# Patient Record
Sex: Female | Born: 1955
Health system: Southern US, Community
[De-identification: ages and names within clinical notes are randomized; demographics above are authoritative.]

## PROBLEM LIST (undated history)

## (undated) DIAGNOSIS — J189 Pneumonia, unspecified organism: Secondary | ICD-10-CM

## (undated) DIAGNOSIS — F32A Depression, unspecified: Secondary | ICD-10-CM

## (undated) DIAGNOSIS — N179 Acute kidney failure, unspecified: Secondary | ICD-10-CM

## (undated) DIAGNOSIS — T7840XA Allergy, unspecified, initial encounter: Secondary | ICD-10-CM

## (undated) DIAGNOSIS — I890 Lymphedema, not elsewhere classified: Secondary | ICD-10-CM

## (undated) DIAGNOSIS — K219 Gastro-esophageal reflux disease without esophagitis: Secondary | ICD-10-CM

## (undated) DIAGNOSIS — Z720 Tobacco use: Secondary | ICD-10-CM

## (undated) DIAGNOSIS — B1920 Unspecified viral hepatitis C without hepatic coma: Secondary | ICD-10-CM

## (undated) DIAGNOSIS — IMO0001 Reserved for inherently not codable concepts without codable children: Secondary | ICD-10-CM

## (undated) DIAGNOSIS — F1010 Alcohol abuse, uncomplicated: Secondary | ICD-10-CM

## (undated) DIAGNOSIS — C50919 Malignant neoplasm of unspecified site of unspecified female breast: Secondary | ICD-10-CM

## (undated) DIAGNOSIS — F319 Bipolar disorder, unspecified: Secondary | ICD-10-CM

## (undated) DIAGNOSIS — G620 Drug-induced polyneuropathy: Secondary | ICD-10-CM

## (undated) DIAGNOSIS — C801 Malignant (primary) neoplasm, unspecified: Secondary | ICD-10-CM

## (undated) DIAGNOSIS — T451X5A Adverse effect of antineoplastic and immunosuppressive drugs, initial encounter: Secondary | ICD-10-CM

## (undated) DIAGNOSIS — F419 Anxiety disorder, unspecified: Secondary | ICD-10-CM

## (undated) DIAGNOSIS — F1410 Cocaine abuse, uncomplicated: Secondary | ICD-10-CM

## (undated) DIAGNOSIS — F329 Major depressive disorder, single episode, unspecified: Secondary | ICD-10-CM

## (undated) DIAGNOSIS — I10 Essential (primary) hypertension: Secondary | ICD-10-CM

## (undated) DIAGNOSIS — M1990 Unspecified osteoarthritis, unspecified site: Secondary | ICD-10-CM

## (undated) HISTORY — DX: Malignant (primary) neoplasm, unspecified: C80.1

## (undated) HISTORY — DX: Lymphedema, not elsewhere classified: I89.0

## (undated) HISTORY — PX: TONSILLECTOMY: SUR1361

## (undated) HISTORY — DX: Adverse effect of antineoplastic and immunosuppressive drugs, initial encounter: T45.1X5A

## (undated) HISTORY — PX: MANDIBLE FRACTURE SURGERY: SHX706

## (undated) HISTORY — PX: MASTECTOMY: SHX3

## (undated) HISTORY — DX: Acute kidney failure, unspecified: N17.9

## (undated) HISTORY — DX: Drug-induced polyneuropathy: G62.0

## (undated) HISTORY — DX: Pneumonia, unspecified organism: J18.9

---

## 2000-05-23 DIAGNOSIS — C801 Malignant (primary) neoplasm, unspecified: Secondary | ICD-10-CM

## 2000-05-23 HISTORY — DX: Malignant (primary) neoplasm, unspecified: C80.1

## 2000-05-23 HISTORY — PX: BREAST SURGERY: SHX581

## 2005-02-23 ENCOUNTER — Emergency Department (HOSPITAL_COMMUNITY): Admission: EM | Admit: 2005-02-23 | Discharge: 2005-02-24 | Payer: Self-pay | Admitting: Emergency Medicine

## 2005-06-13 ENCOUNTER — Ambulatory Visit: Payer: Self-pay | Admitting: Internal Medicine

## 2005-06-17 ENCOUNTER — Ambulatory Visit: Payer: Self-pay | Admitting: Internal Medicine

## 2005-06-24 ENCOUNTER — Ambulatory Visit: Payer: Self-pay | Admitting: Internal Medicine

## 2005-07-01 ENCOUNTER — Ambulatory Visit: Payer: Self-pay | Admitting: Internal Medicine

## 2005-07-22 ENCOUNTER — Ambulatory Visit: Payer: Self-pay | Admitting: Internal Medicine

## 2005-09-02 ENCOUNTER — Ambulatory Visit: Payer: Self-pay | Admitting: Internal Medicine

## 2005-10-10 ENCOUNTER — Ambulatory Visit: Payer: Self-pay | Admitting: Internal Medicine

## 2006-05-01 ENCOUNTER — Ambulatory Visit: Payer: Self-pay | Admitting: Internal Medicine

## 2006-05-20 ENCOUNTER — Emergency Department (HOSPITAL_COMMUNITY): Admission: EM | Admit: 2006-05-20 | Discharge: 2006-05-20 | Payer: Self-pay | Admitting: Emergency Medicine

## 2006-05-21 ENCOUNTER — Ambulatory Visit: Payer: Self-pay | Admitting: Internal Medicine

## 2006-06-15 LAB — CBC WITH DIFFERENTIAL/PLATELET
EOS%: 4.1 % (ref 0.0–7.0)
Eosinophils Absolute: 0.2 10*3/uL (ref 0.0–0.5)
HCT: 36.4 % (ref 34.8–46.6)
HGB: 12.4 g/dL (ref 11.6–15.9)
LYMPH%: 36.2 % (ref 14.0–48.0)
MCHC: 34 g/dL (ref 32.0–36.0)
MONO#: 0.4 10*3/uL (ref 0.1–0.9)
MONO%: 9.6 % (ref 0.0–13.0)
NEUT#: 2.2 10*3/uL (ref 1.5–6.5)
NEUT%: 49.4 % (ref 39.6–76.8)
Platelets: 125 10*3/uL — ABNORMAL LOW (ref 145–400)
WBC: 4.4 10*3/uL (ref 3.9–10.0)
lymph#: 1.6 10*3/uL (ref 0.9–3.3)

## 2006-06-15 LAB — COMPREHENSIVE METABOLIC PANEL
AST: 24 U/L (ref 0–37)
Albumin: 3.7 g/dL (ref 3.5–5.2)
Alkaline Phosphatase: 53 U/L (ref 39–117)
BUN: 14 mg/dL (ref 6–23)
Potassium: 4.1 mEq/L (ref 3.5–5.3)
Sodium: 140 mEq/L (ref 135–145)

## 2006-06-29 ENCOUNTER — Encounter: Admission: RE | Admit: 2006-06-29 | Discharge: 2006-06-29 | Payer: Self-pay | Admitting: Hematology and Oncology

## 2007-06-05 LAB — CONVERTED CEMR LAB: Pap Smear: NEGATIVE

## 2007-06-07 ENCOUNTER — Ambulatory Visit: Payer: Self-pay | Admitting: Nurse Practitioner

## 2007-06-07 DIAGNOSIS — F1910 Other psychoactive substance abuse, uncomplicated: Secondary | ICD-10-CM | POA: Insufficient documentation

## 2007-06-07 DIAGNOSIS — K5909 Other constipation: Secondary | ICD-10-CM

## 2007-06-07 DIAGNOSIS — F4310 Post-traumatic stress disorder, unspecified: Secondary | ICD-10-CM

## 2007-06-07 DIAGNOSIS — S060X9A Concussion with loss of consciousness of unspecified duration, initial encounter: Secondary | ICD-10-CM | POA: Insufficient documentation

## 2007-06-07 DIAGNOSIS — A5901 Trichomonal vulvovaginitis: Secondary | ICD-10-CM | POA: Insufficient documentation

## 2007-06-07 DIAGNOSIS — S060XAA Concussion with loss of consciousness status unknown, initial encounter: Secondary | ICD-10-CM | POA: Insufficient documentation

## 2007-06-07 DIAGNOSIS — F319 Bipolar disorder, unspecified: Secondary | ICD-10-CM

## 2007-06-07 DIAGNOSIS — B182 Chronic viral hepatitis C: Secondary | ICD-10-CM | POA: Insufficient documentation

## 2007-06-07 DIAGNOSIS — Z853 Personal history of malignant neoplasm of breast: Secondary | ICD-10-CM | POA: Insufficient documentation

## 2007-06-07 DIAGNOSIS — K7460 Unspecified cirrhosis of liver: Secondary | ICD-10-CM

## 2007-06-07 LAB — CONVERTED CEMR LAB
ALT: 19 units/L (ref 0–35)
AST: 28 units/L (ref 0–37)
Albumin: 4.2 g/dL (ref 3.5–5.2)
Alkaline Phosphatase: 60 units/L (ref 39–117)
BUN: 13 mg/dL (ref 6–23)
Basophils Absolute: 0 10*3/uL (ref 0.0–0.1)
Basophils Relative: 1 % (ref 0–1)
Bilirubin Urine: NEGATIVE
CO2: 25 meq/L (ref 19–32)
Calcium: 9.5 mg/dL (ref 8.4–10.5)
Chlamydia, DNA Probe: NEGATIVE
Chloride: 106 meq/L (ref 96–112)
Creatinine, Ser: 0.8 mg/dL (ref 0.40–1.20)
Eosinophils Absolute: 0.2 10*3/uL (ref 0.0–0.7)
Eosinophils Relative: 4 % (ref 0–5)
GC Probe Amp, Genital: NEGATIVE
Glucose, Bld: 88 mg/dL (ref 70–99)
Glucose, Urine, Semiquant: NEGATIVE
HCT: 39.3 % (ref 36.0–46.0)
Hemoglobin: 13 g/dL (ref 12.0–15.0)
KOH Prep: NEGATIVE
Ketones, urine, test strip: NEGATIVE
Lymphocytes Relative: 42 % (ref 12–46)
Lymphs Abs: 2.5 10*3/uL (ref 0.7–4.0)
MCHC: 33.1 g/dL (ref 30.0–36.0)
MCV: 88.7 fL (ref 78.0–100.0)
Monocytes Absolute: 0.6 10*3/uL (ref 0.1–1.0)
Monocytes Relative: 10 % (ref 3–12)
Neutro Abs: 2.6 10*3/uL (ref 1.7–7.7)
Neutrophils Relative %: 44 % (ref 43–77)
Nitrite: NEGATIVE
Platelets: 131 10*3/uL — ABNORMAL LOW (ref 150–400)
Potassium: 4.9 meq/L (ref 3.5–5.3)
Protein, U semiquant: NEGATIVE
RBC: 4.43 M/uL (ref 3.87–5.11)
RDW: 14.6 % (ref 11.5–15.5)
Sodium: 142 meq/L (ref 135–145)
Specific Gravity, Urine: 1.015
TSH: 3.371 microintl units/mL (ref 0.350–5.50)
Total Bilirubin: 0.4 mg/dL (ref 0.3–1.2)
Total Protein: 7.6 g/dL (ref 6.0–8.3)
Urobilinogen, UA: 4
WBC Urine, dipstick: NEGATIVE
WBC: 5.9 10*3/uL (ref 4.0–10.5)
pH: 6.5

## 2007-06-08 ENCOUNTER — Encounter (INDEPENDENT_AMBULATORY_CARE_PROVIDER_SITE_OTHER): Payer: Self-pay | Admitting: Nurse Practitioner

## 2007-06-08 DIAGNOSIS — D696 Thrombocytopenia, unspecified: Secondary | ICD-10-CM | POA: Insufficient documentation

## 2007-06-12 ENCOUNTER — Ambulatory Visit: Payer: Self-pay | Admitting: Internal Medicine

## 2007-06-19 ENCOUNTER — Ambulatory Visit: Payer: Self-pay | Admitting: Nurse Practitioner

## 2007-06-20 LAB — CONVERTED CEMR LAB
ALT: 24 units/L (ref 0–35)
AST: 33 units/L (ref 0–37)
Albumin: 4.2 g/dL (ref 3.5–5.2)
Alkaline Phosphatase: 57 units/L (ref 39–117)
BUN: 11 mg/dL (ref 6–23)
Basophils Absolute: 0 10*3/uL (ref 0.0–0.1)
Basophils Relative: 0 % (ref 0–1)
CO2: 25 meq/L (ref 19–32)
Calcium: 9.5 mg/dL (ref 8.4–10.5)
Chloride: 110 meq/L (ref 96–112)
Cholesterol: 169 mg/dL (ref 0–200)
Creatinine, Ser: 0.9 mg/dL (ref 0.40–1.20)
Eosinophils Absolute: 0.1 10*3/uL (ref 0.0–0.7)
Eosinophils Relative: 2 % (ref 0–5)
Glucose, Bld: 93 mg/dL (ref 70–99)
HCT: 41.9 % (ref 36.0–46.0)
HDL: 55 mg/dL (ref >39)
Hemoglobin: 13.1 g/dL (ref 12.0–15.0)
Hgb A1c MFr Bld: 5.8 % (ref 4.6–6.1)
LDL Cholesterol: 98 mg/dL (ref 0–99)
Lymphocytes Relative: 39 % (ref 12–46)
Lymphs Abs: 2.2 10*3/uL (ref 0.7–4.0)
MCHC: 31.3 g/dL (ref 30.0–36.0)
MCV: 93.3 fL (ref 78.0–100.0)
Monocytes Absolute: 0.5 10*3/uL (ref 0.1–1.0)
Monocytes Relative: 9 % (ref 3–12)
Neutro Abs: 2.9 10*3/uL (ref 1.7–7.7)
Neutrophils Relative %: 50 % (ref 43–77)
Platelets: 157 10*3/uL (ref 150–400)
Potassium: 5 meq/L (ref 3.5–5.3)
RBC: 4.49 M/uL (ref 3.87–5.11)
RDW: 15.5 % (ref 11.5–15.5)
Sodium: 146 meq/L — ABNORMAL HIGH (ref 135–145)
TSH: 3.009 microintl units/mL (ref 0.350–5.50)
Total Bilirubin: 0.4 mg/dL (ref 0.3–1.2)
Total CHOL/HDL Ratio: 3.1
Total Protein: 7.6 g/dL (ref 6.0–8.3)
Triglycerides: 81 mg/dL (ref <150)
VLDL: 16 mg/dL (ref 0–40)
Valproic Acid Lvl: 92.6 ug/mL (ref 50.0–100.0)
WBC: 5.7 10*3/uL (ref 4.0–10.5)

## 2008-09-03 ENCOUNTER — Ambulatory Visit: Payer: Self-pay | Admitting: Nurse Practitioner

## 2008-09-03 DIAGNOSIS — M545 Low back pain, unspecified: Secondary | ICD-10-CM | POA: Insufficient documentation

## 2008-09-05 LAB — CONVERTED CEMR LAB
ALT: 20 units/L (ref 0–35)
AST: 26 units/L (ref 0–37)
Albumin: 4 g/dL (ref 3.5–5.2)
Alkaline Phosphatase: 55 units/L (ref 39–117)
BUN: 14 mg/dL (ref 6–23)
Basophils Absolute: 0 10*3/uL (ref 0.0–0.1)
Basophils Relative: 1 % (ref 0–1)
CO2: 22 meq/L (ref 19–32)
Calcium: 9.6 mg/dL (ref 8.4–10.5)
Chloride: 106 meq/L (ref 96–112)
Creatinine, Ser: 0.94 mg/dL (ref 0.40–1.20)
Eosinophils Absolute: 0.2 10*3/uL (ref 0.0–0.7)
Eosinophils Relative: 3 % (ref 0–5)
Glucose, Bld: 108 mg/dL — ABNORMAL HIGH (ref 70–99)
HCT: 39.3 % (ref 36.0–46.0)
Hemoglobin: 12.9 g/dL (ref 12.0–15.0)
Lymphocytes Relative: 50 % — ABNORMAL HIGH (ref 12–46)
Lymphs Abs: 2.7 10*3/uL (ref 0.7–4.0)
MCHC: 32.8 g/dL (ref 30.0–36.0)
MCV: 89.9 fL (ref 78.0–100.0)
Monocytes Absolute: 0.4 10*3/uL (ref 0.1–1.0)
Monocytes Relative: 8 % (ref 3–12)
Neutro Abs: 2 10*3/uL (ref 1.7–7.7)
Neutrophils Relative %: 38 % — ABNORMAL LOW (ref 43–77)
Platelets: 121 10*3/uL — ABNORMAL LOW (ref 150–400)
Potassium: 4.9 meq/L (ref 3.5–5.3)
RBC: 4.37 M/uL (ref 3.87–5.11)
RDW: 15.7 % — ABNORMAL HIGH (ref 11.5–15.5)
Sodium: 143 meq/L (ref 135–145)
TSH: 1.107 microintl units/mL (ref 0.350–4.500)
Total Bilirubin: 0.3 mg/dL (ref 0.3–1.2)
Total Protein: 7.1 g/dL (ref 6.0–8.3)
WBC: 5.3 10*3/uL (ref 4.0–10.5)

## 2008-09-15 ENCOUNTER — Ambulatory Visit (HOSPITAL_COMMUNITY): Admission: RE | Admit: 2008-09-15 | Discharge: 2008-09-15 | Payer: Self-pay | Admitting: Nurse Practitioner

## 2008-09-15 ENCOUNTER — Encounter (INDEPENDENT_AMBULATORY_CARE_PROVIDER_SITE_OTHER): Payer: Self-pay | Admitting: Nurse Practitioner

## 2010-01-15 ENCOUNTER — Encounter (INDEPENDENT_AMBULATORY_CARE_PROVIDER_SITE_OTHER): Payer: Self-pay | Admitting: Nurse Practitioner

## 2010-01-20 ENCOUNTER — Ambulatory Visit: Payer: Self-pay | Admitting: Nurse Practitioner

## 2010-01-20 DIAGNOSIS — K149 Disease of tongue, unspecified: Secondary | ICD-10-CM | POA: Insufficient documentation

## 2010-02-03 ENCOUNTER — Emergency Department (HOSPITAL_COMMUNITY): Admission: EM | Admit: 2010-02-03 | Discharge: 2010-02-03 | Payer: Self-pay | Admitting: Emergency Medicine

## 2010-03-23 ENCOUNTER — Other Ambulatory Visit: Admission: RE | Admit: 2010-03-23 | Discharge: 2010-03-23 | Payer: Self-pay | Admitting: Nurse Practitioner

## 2010-03-23 ENCOUNTER — Ambulatory Visit: Payer: Self-pay | Admitting: Nurse Practitioner

## 2010-03-23 LAB — CONVERTED CEMR LAB
KOH Prep: NEGATIVE
OCCULT 1: NEGATIVE
Rapid HIV Screen: NEGATIVE

## 2010-03-25 LAB — CONVERTED CEMR LAB
ALT: 16 units/L (ref 0–35)
AST: 23 units/L (ref 0–37)
Albumin: 4 g/dL (ref 3.5–5.2)
Alkaline Phosphatase: 50 units/L (ref 39–117)
BUN: 8 mg/dL (ref 6–23)
Basophils Absolute: 0 10*3/uL (ref 0.0–0.1)
Basophils Relative: 0 % (ref 0–1)
CO2: 25 meq/L (ref 19–32)
Calcium: 8.8 mg/dL (ref 8.4–10.5)
Chlamydia, DNA Probe: NEGATIVE
Chloride: 105 meq/L (ref 96–112)
Creatinine, Ser: 0.69 mg/dL (ref 0.40–1.20)
Eosinophils Absolute: 0.1 10*3/uL (ref 0.0–0.7)
Eosinophils Relative: 3 % (ref 0–5)
GC Probe Amp, Genital: NEGATIVE
Glucose, Bld: 81 mg/dL (ref 70–99)
HCT: 36.2 % (ref 36.0–46.0)
Hemoglobin: 11.8 g/dL — ABNORMAL LOW (ref 12.0–15.0)
Lymphocytes Relative: 44 % (ref 12–46)
Lymphs Abs: 2.3 10*3/uL (ref 0.7–4.0)
MCHC: 32.6 g/dL (ref 30.0–36.0)
MCV: 91.2 fL (ref 78.0–100.0)
Monocytes Absolute: 0.5 10*3/uL (ref 0.1–1.0)
Monocytes Relative: 10 % (ref 3–12)
Neutro Abs: 2.2 10*3/uL (ref 1.7–7.7)
Neutrophils Relative %: 43 % (ref 43–77)
Platelets: 94 10*3/uL — ABNORMAL LOW (ref 150–400)
Potassium: 4.3 meq/L (ref 3.5–5.3)
RBC: 3.97 M/uL (ref 3.87–5.11)
RDW: 15.6 % — ABNORMAL HIGH (ref 11.5–15.5)
Sodium: 141 meq/L (ref 135–145)
TSH: 2.295 microintl units/mL (ref 0.350–4.500)
Total Bilirubin: 0.4 mg/dL (ref 0.3–1.2)
Total Protein: 6.8 g/dL (ref 6.0–8.3)
WBC: 5.1 10*3/uL (ref 4.0–10.5)

## 2010-03-26 ENCOUNTER — Encounter (INDEPENDENT_AMBULATORY_CARE_PROVIDER_SITE_OTHER): Payer: Self-pay | Admitting: Nurse Practitioner

## 2010-03-26 LAB — CONVERTED CEMR LAB: Pap Smear: NEGATIVE

## 2010-03-29 ENCOUNTER — Encounter: Admission: RE | Admit: 2010-03-29 | Discharge: 2010-03-29 | Payer: Self-pay | Admitting: Internal Medicine

## 2010-04-01 ENCOUNTER — Encounter: Admission: RE | Admit: 2010-04-01 | Discharge: 2010-04-01 | Payer: Self-pay | Admitting: Otolaryngology

## 2010-04-20 ENCOUNTER — Telehealth (INDEPENDENT_AMBULATORY_CARE_PROVIDER_SITE_OTHER): Payer: Self-pay | Admitting: Nurse Practitioner

## 2010-04-20 ENCOUNTER — Ambulatory Visit: Payer: Self-pay | Admitting: Nurse Practitioner

## 2010-04-20 ENCOUNTER — Encounter: Payer: Self-pay | Admitting: Gastroenterology

## 2010-04-23 ENCOUNTER — Encounter (INDEPENDENT_AMBULATORY_CARE_PROVIDER_SITE_OTHER): Payer: Self-pay | Admitting: *Deleted

## 2010-04-26 ENCOUNTER — Encounter (INDEPENDENT_AMBULATORY_CARE_PROVIDER_SITE_OTHER): Payer: Self-pay | Admitting: Nurse Practitioner

## 2010-04-29 ENCOUNTER — Encounter
Admission: RE | Admit: 2010-04-29 | Discharge: 2010-04-29 | Payer: Self-pay | Source: Home / Self Care | Attending: Internal Medicine | Admitting: Internal Medicine

## 2010-05-11 ENCOUNTER — Ambulatory Visit: Payer: Self-pay | Admitting: Nurse Practitioner

## 2010-05-13 ENCOUNTER — Encounter (INDEPENDENT_AMBULATORY_CARE_PROVIDER_SITE_OTHER): Payer: Self-pay | Admitting: *Deleted

## 2010-05-13 ENCOUNTER — Encounter (INDEPENDENT_AMBULATORY_CARE_PROVIDER_SITE_OTHER): Payer: Self-pay | Admitting: Nurse Practitioner

## 2010-05-13 ENCOUNTER — Emergency Department (HOSPITAL_COMMUNITY)
Admission: EM | Admit: 2010-05-13 | Discharge: 2010-05-13 | Payer: Self-pay | Source: Home / Self Care | Admitting: Family Medicine

## 2010-05-13 DIAGNOSIS — E669 Obesity, unspecified: Secondary | ICD-10-CM | POA: Insufficient documentation

## 2010-05-13 LAB — CONVERTED CEMR LAB
Cholesterol: 187 mg/dL (ref 0–200)
HDL: 73 mg/dL (ref >39)
LDL Cholesterol: 95 mg/dL (ref 0–99)
Total CHOL/HDL Ratio: 2.6
Triglycerides: 93 mg/dL (ref <150)
VLDL: 19 mg/dL (ref 0–40)

## 2010-05-14 ENCOUNTER — Encounter (INDEPENDENT_AMBULATORY_CARE_PROVIDER_SITE_OTHER): Payer: Self-pay | Admitting: Nurse Practitioner

## 2010-05-19 ENCOUNTER — Encounter (INDEPENDENT_AMBULATORY_CARE_PROVIDER_SITE_OTHER): Payer: Self-pay | Admitting: *Deleted

## 2010-05-21 ENCOUNTER — Ambulatory Visit: Admit: 2010-05-21 | Payer: Self-pay | Admitting: Gastroenterology

## 2010-06-07 ENCOUNTER — Telehealth: Payer: Self-pay | Admitting: Gastroenterology

## 2010-06-08 ENCOUNTER — Telehealth (INDEPENDENT_AMBULATORY_CARE_PROVIDER_SITE_OTHER): Payer: Self-pay | Admitting: *Deleted

## 2010-06-10 ENCOUNTER — Ambulatory Visit
Admission: RE | Admit: 2010-06-10 | Discharge: 2010-06-10 | Payer: Self-pay | Source: Home / Self Care | Attending: Gastroenterology | Admitting: Gastroenterology

## 2010-06-10 ENCOUNTER — Ambulatory Visit: Admit: 2010-06-10 | Payer: Self-pay | Admitting: Gastroenterology

## 2010-06-10 ENCOUNTER — Encounter (INDEPENDENT_AMBULATORY_CARE_PROVIDER_SITE_OTHER): Payer: Self-pay | Admitting: Nurse Practitioner

## 2010-06-10 ENCOUNTER — Encounter: Payer: Self-pay | Admitting: Gastroenterology

## 2010-06-10 ENCOUNTER — Encounter (INDEPENDENT_AMBULATORY_CARE_PROVIDER_SITE_OTHER): Payer: Self-pay | Admitting: *Deleted

## 2010-06-22 NOTE — Assessment & Plan Note (Signed)
Summary: F/u lab visit   Vital Signs:  Patient profile:   55 year old female Menstrual status:  postmenopausal Weight:      146.7 pounds BMI:     28.75 Temp:     97.1 degrees F oral Pulse rate:   60 / minute Pulse rhythm:   regular Resp:     16 per minute BP sitting:   130 / 80  (left arm) Cuff size:   regular  Vitals Entered By: Levon Hedger (April 20, 2010 10:21 AM)  Nutrition Counseling: Patient's BMI is greater than 25 and therefore counseled on weight management options. CC: follow-up visit 4 weeks...fasting labs pt has alread eaten today. Is Patient Diabetic? No Pain Assessment Patient in pain? no       Does patient need assistance? Functional Status Self care Ambulation Normal   CC:  follow-up visit 4 weeks...fasting labs pt has alread eaten today.Marland Kitchen  History of Present Illness:  Pt into the office for review of labs. She was supposed to be fasting but reports that she ate breakfast this morning. Labs done during last visit show that she is thrombocytopenic. This has been an ongoing problem for pt. She still admits to some ETOH use - about 2-3 beers per week which has decreased from previous amount. No menses Denies bleeding for extended periods of time with cuts  ENT - pt has been going and was recently started on medications for acid reflux but she does not know the name of the medications and she did not bring it here with her today.  Habits & Providers  Alcohol-Tobacco-Diet     Alcohol drinks/day: 1     Alcohol Counseling: to decrease amount and/or frequency of alcohol intake     Alcohol type: beer     Tobacco Status: current     Tobacco Counseling: to quit use of tobacco products     Cigarette Packs/Day: 1/4     Year Started: 1972     Passive Smoke Exposure: no  Exercise-Depression-Behavior     Does Patient Exercise: no     Drug Use: marijuanna     Seat Belt Use: 100     Sun Exposure: occasionally  Allergies: No Known Drug  Allergies  Review of Systems General:  Denies fever. CV:  Denies chest pain or discomfort. Resp:  Denies cough. GI:  Denies abdominal pain, nausea, and vomiting.  Physical Exam  General:  alert.   Head:  normocephalic.   Ears:  ear piercing(s) noted.   Lungs:  normal breath sounds.   Heart:  normal rate and regular rhythm.     Impression & Recommendations:  Problem # 1:  THROMBOCYTOPENIA (ICD-287.5) Pt has a history of hepatitis C which may be why pt is thrombocytenia She was treated at [facility]  Problem # 2:  SCREENING, COLON CANCER (ICD-V76.51) will schedule for pt Hx of constipation never had colonscopy no family hx of colon cancer Orders: Colonoscopy (Colon)  Problem # 3:  BIPOLAR AFFECTIVE DISORDER (ICD-296.80) pt to continue f/u at the [facility] meds reviewed today with pt  Complete Medication List: 1)  Sertraline Hcl 100 Mg Tabs (Sertraline hcl) .... 2 tablets by mouth at bedtime rx by dr. Gwyndolyn Kaufman 2)  Neurontin 300 Mg Caps (Gabapentin) .... 3 capsules by mouth at bedtime rx by dr. Gwyndolyn Kaufman 3)  Depakote 500 Mg Tbec (Divalproex sodium) .... 2 tablets by mouth at bedtime rx by dr. Gwyndolyn Kaufman 4)  Trazodone Hcl 50 Mg Tabs (Trazodone hcl) .Marland Kitchen.. 1-2  tablets by mouth at bedtime **rx by [facility]**  Patient Instructions: 1)  Schedule a lab visit for lipids. 2)  No food after midnight before this visit 3)  You will be scheduled for a colonscopy. 4)  You will be notified of the time/date of this appointment. 5)  Follow up in this office at least every 6 months or sooner if necessary   Orders Added: 1)  Est. Patient Level III [09811] 2)  Colonoscopy [Colon]    Prevention & Chronic Care Immunizations   Influenza vaccine: Fluvax 3+  (03/23/2010)    Tetanus booster: 05/23/2005: historical    Pneumococcal vaccine: Not documented  Colorectal Screening   Hemoccult: Not documented   Hemoccult action/deferral: Ordered  (03/23/2010)   Hemoccult due: 03/24/2011     Colonoscopy: Not documented   Colonoscopy action/deferral: GI referral  (04/20/2010)  Other Screening   Pap smear:  Specimen Adequacy: Satisfactory for evaluation.   Interpretation/Result:Negative for intraepithelial Lesion or Malignancy.     (03/23/2010)   Pap smear action/deferral: Ordered  (03/23/2010)   Pap smear due: 03/2011    Mammogram: mammograms for comparison - BI-RADS 0^MM DIGITAL SCREENING UNILAT R  (03/29/2010)   Mammogram action/deferral: Ordered  (03/23/2010)   Smoking status: current  (04/20/2010)   Smoking cessation counseling: yes  (03/23/2010)  Lipids   Total Cholesterol: 169  (06/19/2007)   LDL: 98  (06/19/2007)   LDL Direct: Not documented   HDL: 55  (06/19/2007)   Triglycerides: 81  (06/19/2007)

## 2010-06-22 NOTE — Letter (Signed)
Summary: MAILED REQUESTED RECORDS TO [facility]  MAILED REQUESTED RECORDS TO [facility]   Imported By: Arta Bruce 01/18/2010 14:50:31  _____________________________________________________________________  External Attachment:    Type:   Image     Comment:   External Document

## 2010-06-22 NOTE — Letter (Signed)
Summary: Generic Letter  [facility]  [address] [place], [place] [address]   Phone: [phone]  Fax: [phone]        04/23/2010  Marissa Brooks [address] [place], [place]  [address]  Dear [name],  We have been unable to reach you by phone. The medication you had requested was ordered by your ENT doctor so you will need to contact  them for a refill.         Sincerely,   Gaylyn Cheers RN

## 2010-06-22 NOTE — Letter (Signed)
Summary: MAMMOGRAM  MAMMOGRAM   Imported By: Arta Bruce 04/08/2010 16:48:53  _____________________________________________________________________  External Attachment:    Type:   Image     Comment:   External Document

## 2010-06-22 NOTE — Progress Notes (Signed)
Summary: CALLED BACK W/OTHER MED  Phone Note Call from Patient Call back at Home Phone [phone]   Reason for Call: Refill Medication Summary of Call: Marissa Brooks. MS [place] CALLED BACK TO LET YOU KNOW THE NAME OF ANOTHE MEDICINE THAT SHE NEEDS A REFILL ON. ITS CALLED LANSOPRAZOLE 30MG  1 CAPSULE BY MOUTH BEFORE MEALS, FOR ACID REFLUX. SHE USES [facility] [address] Initial call taken by: Leodis Rains,  April 20, 2010 4:58 PM  Follow-up for Phone Call        Rx started by ENT I asked her to call back and let me know the name of the medication but since ENT started it she needs to consult them for refills. perhaps they just wanted it for a short period of time then re-evaluate Follow-up by: Lehman Prom FNP,  April 20, 2010 5:26 PM  Additional Follow-up for Phone Call Additional follow up Details #1::        [employer] customer not available. Gaylyn Cheers RN  April 21, 2010 8:22 AM [employer] customer not available Gaylyn Cheers RN  April 22, 2010 10:27 AM      Additional Follow-up for Phone Call Additional follow up Details #2::    [employer] customer not available, will mail letter Gaylyn Cheers RN  April 23, 2010 12:51 PM   New/Updated Medications: LANSOPRAZOLE 30 MG CPDR (LANSOPRAZOLE) One tablet by mouth daily

## 2010-06-22 NOTE — Assessment & Plan Note (Signed)
Summary: Tongue problem   Vital Signs:  Patient profile:   55 year old female Menstrual status:  postmenopausal Weight:      157.2 pounds BMI:     30.81 Temp:     97.5 degrees F oral Pulse rate:   60 / minute Pulse rhythm:   regular Resp:     16 per minute BP sitting:   120 / 68  (left arm) Cuff size:   regular  Vitals Entered By: Levon Hedger (January 20, 2010 11:30 AM)  Nutrition Counseling: Patient's BMI is greater than 25 and therefore counseled on weight management options. CC: pt states she keeps getting bumps on her tongue that is painful Is Patient Diabetic? No Pain Assessment Patient in pain? yes     Location: tongue Intensity: 6-7  Does patient need assistance? Functional Status Self care Ambulation Normal     Menstrual Status postmenopausal Last PAP Result negative   CC:  pt states she keeps getting bumps on her tongue that is painful.  History of Present Illness:  Pt into the office with "bumps on her tongue" Started 2 months ago Sometimes the areas spontaneously go away on their own but there is one area on left side of tongue that has been there for the past 2 weeks Both hot and cold foods make the area uncomfortable Denies any recent antibiotics of medication changes admits that she drinks orange juice daily but no other acidic foods    Habits & Providers  Alcohol-Tobacco-Diet     Alcohol drinks/day: 4+     Alcohol type: beer     Tobacco Status: current     Cigarette Packs/Day: 1/4     Year Started: 1972     Passive Smoke Exposure: no  Exercise-Depression-Behavior     Does Patient Exercise: no     Drug Use: yes     Seat Belt Use: 100     Sun Exposure: occasionally  Allergies (verified): No Known Drug Allergies  Review of Systems ENT:  sore tongue. CV:  Denies chest pain or discomfort. Resp:  Denies cough. GI:  Denies abdominal pain, nausea, and vomiting.  Physical Exam  General:  alert.   Head:  normocephalic.   Mouth:   anterior tongue with white plaque posterior tongue with several raised buds left lower ginginva with mucocel fair dentition.   Lungs:  normal breath sounds.   Heart:  normal rate and regular rhythm.   Msk:  normal ROM.   Neurologic:  alert & oriented X3.     Impression & Recommendations:  Problem # 1:  TONGUE DISORDER (ICD-529.9) will start nystatin advise pt to brush tongue will reassess at next visit and do lab work  Problem # 2:  HEPATITIS C (ICD-070.51) hx - unable to ascertain if pt was treated  Complete Medication List: 1)  Ibuprofen 600 Mg Tabs (Ibuprofen) .Marland Kitchen.. 1 tablet by mouth two times a day as needed for pain 2)  Sertraline Hcl 100 Mg Tabs (Sertraline hcl) .... Rx by dr. Gwyndolyn Kaufman 3)  Neurontin 300 Mg Caps (Gabapentin) .... Rx by dr. Gwyndolyn Kaufman 4)  Depakote 500 Mg Tbec (Divalproex sodium) .... Rx by dr. Gwyndolyn Kaufman 5)  Nystatin 100000 Unit/ml Susp (Nystatin) .... One teaspoon before meals and at dinner swish and spit  Patient Instructions: 1)  Use the nystatin three times a day before meals and at bedtime 2)  Brush your tongue with the toothbrush - you may need to buy a new one 3)  Follow up for  a complete physical exam 4)  Come fasting for this appointment  - no food after midnight before this visit 5)  Willl need PAP, mammogram. EKG, u/a, labs - lipids, cbc, cmp, tsh, hiv and STD testing Prescriptions: NYSTATIN 100000 UNIT/ML SUSP (NYSTATIN) One teaspoon before meals and at dinner swish and spit  #437ml x 0   Entered and Authorized by:   Lehman Prom FNP   Signed by:   Lehman Prom FNP on 01/20/2010   Method used:   Print then Give to Patient   RxID:   [id number]

## 2010-06-22 NOTE — Assessment & Plan Note (Signed)
Summary: Complete Physcial Exam   Vital Signs:  Patient profile:   55 year old female Menstrual status:  postmenopausal Weight:      153.7 pounds BMI:     30.13 Temp:     97.5 degrees F oral Pulse rate:   60 / minute Pulse rhythm:   regular Resp:     16 per minute BP sitting:   110 / 70  (left arm) Cuff size:   regular  Vitals Entered By: Levon Hedger (March 23, 2010 10:31 AM)  Nutrition Counseling: Patient's BMI is greater than 25 and therefore counseled on weight management options. CC: CPP...pain in left breast Is Patient Diabetic? No Pain Assessment Patient in pain? yes     Location: left breast  Does patient need assistance? Functional Status Self care Ambulation Normal   CC:  CPP...pain in left breast.  History of Present Illness:  Pt into the office for a complete physical exam  PAP -  Post menopausal - LMP at age 74 No children Separated from husband but "We still see each other" Pt is sexually active with her husband.  Hx of trichomonas and would like retesting for STD  Mammogram - last done in 2009 reports that sister had breast cancer. Later during exam pt reports that she had breast cancer in the 1990's for which she had 2 surgeries  Optho - wears reading glasses No eye exam   Dental - last dental exam was 1 year ago  Hx Hepatitis C: Pt reports that she went to [facility] in 1990's for treatment.  No further f/u done since that time  Habits & Providers  Alcohol-Tobacco-Diet     Alcohol drinks/day: 1     Alcohol Counseling: to decrease amount and/or frequency of alcohol intake     Alcohol type: beer     Tobacco Status: current     Tobacco Counseling: to quit use of tobacco products     Cigarette Packs/Day: 1/4     Year Started: 1972     Passive Smoke Exposure: no  Exercise-Depression-Behavior     Does Patient Exercise: no     Have you felt down or hopeless? no     Have you felt little pleasure in things? no     Drug Use:  marijuanna     Seat Belt Use: 100     Sun Exposure: occasionally  Comments: PHQ-9 score not done; pt already goes to mental health  Allergies (verified): No Known Drug Allergies  Social History: Drug Use:  marijuanna  Review of Systems General:  Denies fever. Eyes:  Denies blurring. ENT:  Complains of ringing in ears; denies earache; " I got to get a hearing aid". CV:  Denies fatigue. Resp:  Denies cough. GI:  Complains of constipation; denies abdominal pain, nausea, and vomiting. GU:  Denies discharge and hematuria. MS:  Denies joint pain. Derm:  Denies dryness. Neuro:  Denies headaches. Psych:  Denies anxiety and depression; [facility]  Physical Exam  General:  alert.   Head:  normocephalic.   Eyes:  pupils round.   Ears:  ear piercing(s) noted.  bil TM with bony landmarks present Nose:  no nasal discharge.   Mouth:  pharynx pink and moist and fair dentition.   Neck:  supple.   Chest Wall:  no mass.   Breasts:  left breast - nipple removed; surgical scars present right breast - nipple everted, no masses noted Lungs:  normal breath  sounds.   Heart:  normal rate and regular rhythm.   Abdomen:  soft, non-tender, and normal bowel sounds.   Rectal:  external hemorrhoid(s).   Msk:  normal ROM.   Extremities:  no edema Neurologic:  alert & oriented X3 and DTRs symmetrical and normal.    Pelvic Exam  Vulva:      normal appearance.   Urethra and Bladder:      Urethra--normal.   Vagina:      physiologic discharge.   Cervix:      midposition.   Uterus:      smooth.   Adnexa:      normal.   Rectum:      heme negative stool, + external hemorrhoids.      Impression & Recommendations:  Problem # 1:  SPECIAL SCREENING FOR MALIGNANT NEOPLASMS VAGINA (ICD-V76.47) PAP done today PHQ-9 score (not done -pt goes to mental health) advised routine optho and dental exam EKG done Orders: Pap Smear, Thin Prep ( Collection of)  (V2536)  Problem # 2:  NEOPLASM, MALIGNANT, BREAST, FAMILY HX, SIBLING (ICD-V16.3) self breast exam placcard given to pt mammogram scheduled Pt also has a history of breast cancer Orders: Mammogram (Screening) (Mammo)  Problem # 3:  Hx of TRICHOMONAL VAGINITIS (ICD-131.01) will check today pt has disfunctional relationshio with husband advised condom use to protect against STD's Orders: KOH/ WET Mount [phone]) Pap Smear, Thin Prep ( Collection of) (Q0091) T- GC Chlamydia (47425) Rapid HIV  (95638)  Problem # 4:  THROMBOCYTOPENIA (ICD-287.5) most likely due to hx of ETOH use - pt reports ETOH use has decreased will check labs Orders: T-CBC w/Diff ([id number])  Problem # 5:  HEPATIC CIRRHOSIS (ICD-571.5) no f/u since 1990's - will need to review pt's record to determine if she was cleared or if she just did not return Orders: T-Comprehensive Metabolic Panel ([id number])  Problem # 6:  CONSTIPATION (ICD-564.00) advised high fiber diet Orders: T-TSH (88416-60630)  Problem # 7:  NEED PROPHYLACTIC VACCINATION&INOCULATION FLU (ICD-V04.81) given today  Complete Medication List: 1)  Ibuprofen 600 Mg Tabs (Ibuprofen) .Marland Kitchen.. 1 tablet by mouth two times a day as needed for pain 2)  Sertraline Hcl 100 Mg Tabs (Sertraline hcl) .... Rx by dr. Gwyndolyn Kaufman 3)  Neurontin 300 Mg Caps (Gabapentin) .... Rx by dr. Gwyndolyn Kaufman 4)  Depakote 500 Mg Tbec (Divalproex sodium) .... Rx by dr. Gwyndolyn Kaufman 5)  Nystatin 100000 Unit/ml Susp (Nystatin) .... One teaspoon before meals and at dinner swish and spit  Other Orders: UA Dipstick w/o Micro (manual) (16010) EKG w/ Interpretation (93000) Flu Vaccine 37yrs + (93235) Admin 1st Vaccine (57322)  Patient Instructions: 1)  Your labs will be checked today and you will be notified of the results. 2)  You will be scheduled for a mammogram. 3)  Follow up with n.martin,fnp in 4 weeks for lab review. 4)  will need to discuss colonscopy 5)  Come fasting - no food after  midnight (will need lipids)   Orders Added: 1)  Est. Patient Level IV [02542] 2)  UA Dipstick w/o Micro (manual) [81002] 3)  KOH/ WET Mount [87210] 4)  Pap Smear, Thin Prep ( Collection of) [Q0091] 5)  EKG w/ Interpretation [93000] 6)  T- GC Chlamydia [70623] 7)  T-Comprehensive Metabolic Panel [80053-22900] 8)  T-CBC w/Diff [76283-15176] 9)  Rapid HIV  [92370] 10)  T-TSH [16073-71062] 11)  Mammogram (Screening) [Mammo] 12)  Flu Vaccine 37yrs + [69485] 13)  Admin 1st Vaccine [46270]   Immunizations Administered:  Influenza Vaccine # 1:    Vaccine Type: Fluvax 3+    Site: left deltoid    Mfr: GlaxoSmithKline    Dose: 0.5 ml    Route: IM    Given by: Levon Hedger    Exp. Date: 11/20/2010    Lot #: [id number]    VIS given: 12/15/09 version given March 23, 2010.  Flu Vaccine Consent Questions:    Do you have a history of severe allergic reactions to this vaccine? no    Any prior history of allergic reactions to egg and/or gelatin? no    Do you have a sensitivity to the preservative Thimersol? no    Do you have a past history of Guillan-Barre Syndrome? no    Do you currently have an acute febrile illness? no    Have you ever had a severe reaction to latex? no    Vaccine information given and explained to patient? yes    Are you currently pregnant? no    ndc  [phone]  Immunizations Administered:  Influenza Vaccine # 1:    Vaccine Type: Fluvax 3+    Site: left deltoid    Mfr: GlaxoSmithKline    Dose: 0.5 ml    Route: IM    Given by: Levon Hedger    Exp. Date: 11/20/2010    Lot #: [id number]    VIS given: 12/15/09 version given March 23, 2010.  Prevention & Chronic Care Immunizations   Influenza vaccine: Fluvax 3+  (03/23/2010)    Tetanus booster: 05/23/2005: historical    Pneumococcal vaccine: Not documented  Colorectal Screening   Hemoccult: Not documented   Hemoccult action/deferral: Ordered  (03/23/2010)   Hemoccult due: 03/24/2011     Colonoscopy: Not documented  Other Screening   Pap smear: negative  (06/05/2007)   Pap smear action/deferral: Ordered  (03/23/2010)   Pap smear due: 03/23/2012    Mammogram: Not documented   Mammogram action/deferral: Ordered  (03/23/2010)   Smoking status: current  (03/23/2010)   Smoking cessation counseling: yes  (03/23/2010)  Lipids   Total Cholesterol: 169  (06/19/2007)   LDL: 98  (06/19/2007)   LDL Direct: Not documented   HDL: 55  (06/19/2007)   Triglycerides: 81  (06/19/2007)   Nursing Instructions: Give Flu vaccine today   Laboratory Results  Date/Time Received: March 23, 2010 11:47 AM   Wet Mount Source: vaginal WBC/hpf: 1-5 Bacteria/hpf: rare Clue cells/hpf: none Yeast/hpf: none Wet Mount KOH: Negative Trichomonas/hpf: none  Other Tests  Rapid HIV: negative  Stool - Occult Blood Hemmoccult #1: negative Date: 03/23/2010

## 2010-06-22 NOTE — Letter (Signed)
Summary: *HSN Results Follow up  [facility]  [address] [place], [place] [address]   Phone: [phone]  Fax: [phone]      03/26/2010   [facility] [address] [place], [place]  [address]   Dear  Ms. Madelyn Schwanke,                            ____S.Drinkard,FNP   ____D. Gore,FNP       ____B. McPherson,MD   ____V. Rankins,MD    ____E. Mulberry,MD    __X__N. Daphine Deutscher, FNP  ____D. Reche Dixon, MD    ____K. Philipp Deputy, MD    ____Other     This letter is to inform you that your recent test(s):  __X_____Pap Smear    _______Lab Test     _______X-ray    __X_____ is within acceptable limits  _______ requires a medication change  _______ requires a follow-up lab visit  _______ requires a follow-up visit with your Chay Mazzoni   Comments: Pap Smear done during recent office visit is normal.       _________________________________________________________ If you have any questions, please contact our office [phone].                    Sincerely,    Lehman Prom FNP [facility]

## 2010-06-24 NOTE — Miscellaneous (Signed)
Summary: DIRECT COL.Marissa Brooks.  Clinical Lists Changes Patient scheduled for screening colon with propofol at [facility] for [place] on 07/01/10 @10 :30am. Pre-visit instructing patient.

## 2010-06-24 NOTE — Letter (Signed)
Summary: FAXED REQUESTED FORM TO [facility]  FAXED REQUESTED FORM TO [facility]   Imported By: Arta Bruce 06/10/2010 10:42:42  _____________________________________________________________________  External Attachment:    Type:   Image     Comment:   External Document

## 2010-06-24 NOTE — Procedures (Signed)
Summary: Colon Prep/[facility]  Colon Prep/[facility]   Imported By: Lester [place] 06/15/2010 12:11:48  _____________________________________________________________________  External Attachment:    Type:   Image     Comment:   External Document

## 2010-06-24 NOTE — Letter (Signed)
Summary: Lipid Letter  [facility]  [address] [place], [place] [address]   Phone: [phone]  Fax: [phone]    05/14/2010  Marissa Brooks [address] [place], [place]  [address]  Dear [name]:  We have carefully reviewed your last lipid profile from 05/13/2010 and the results are noted below with a summary of recommendations for lipid management.    Cholesterol:       187     Goal: less than 200   HDL "good" Cholesterol:   73     Goal: less than 40   LDL "bad" Cholesterol:   95     Goal: less than 100   Triglycerides:       93     Goal: less than 150  Your cholesterol labs are ok.     Current Medications: 1)    Sertraline Hcl 100 Mg Tabs (Sertraline hcl) .... 2 tablets by mouth at bedtime rx by dr. Gwyndolyn Kaufman 2)    Neurontin 300 Mg Caps (Gabapentin) .... 3 capsules by mouth at bedtime rx by dr. Gwyndolyn Kaufman 3)    Depakote 500 Mg Tbec (Divalproex sodium) .... 2 tablets by mouth at bedtime rx by dr. Gwyndolyn Kaufman 4)    Trazodone Hcl 50 Mg Tabs (Trazodone hcl) .Marland Kitchen.. 1-2 tablets by mouth at bedtime **rx by [facility]** 5)    Lansoprazole 30 Mg Cpdr (Lansoprazole) .... One tablet by mouth daily  If you have any questions, please call. We appreciate being able to work with you.   Sincerely,    [facility] Lehman Prom FNP

## 2010-06-24 NOTE — Progress Notes (Signed)
Summary: question re: procedure  Phone Note Outgoing Call   Call placed by: Karl Bales RN,  June 07, 2010 9:47 AM Summary of Call: Dr. Christella Hartigan, This pt is coming in for a pre-visit on 06-10-10 for a colonoscopy on 06-29-10.  While prepping her chart, noticed she takes Sertraline, Trazdone, Neurontin, and Depakote.  She has a hx of back pain, bipolar, and PTSD,  Just checking to see if you want her procedure done at [facility] or at [facility] with propofol.  Thank you, Kristen Initial call taken by: Karl Bales RN,  June 07, 2010 9:49 AM  Follow-up for Phone Call        should be at [facility] with propofol (me next available EUS thursday) Follow-up by: Rachael Fee MD,  June 07, 2010 9:56 AM  Additional Follow-up for Phone Call Additional follow up Details #1::        This pt is coming in for her PV on 06-10-10.  Will set up her appt time then Additional Follow-up by: Karl Bales RN,  June 07, 2010 10:08 AM    Additional Follow-up for Phone Call Additional follow up Details #2::    Attempted to reach pt to explain that her procedure would be done at [facility] with propofol but message states "incorrect code please check number."  Tried number twice but unable to reach pt. Follow-up by: Karl Bales RN,  June 07, 2010 10:13 AM

## 2010-06-24 NOTE — Progress Notes (Signed)
Summary: DROPPED OFF PERSONAL CARE FORM  Phone Note Call from Patient Call back at [facility] Phone [phone]   Summary of Call: [facility] pt. Marissa Brooks CAME BY AND DROPPED OFF HER PERSONAL CARE FORM TO BE FILLED OUT AND AFTERWARDS TO BE FAXED TO [facility] AND TO [facility], WHOM SHE WANTS HER SERVICES WITH.  THE LADY WHO ALWAYS BRINGS Lexxie, SAYS THAT Margi ONLY NEEDS LIMITED IN HOME CARE. Initial call taken by: Leodis Rains,  June 08, 2010 12:37 PM  Follow-up for Phone Call        form completed and in office  fax back Follow-up by: Lehman Prom FNP,  June 09, 2010 2:44 PM  Additional Follow-up for Phone Call Additional follow up Details #1::        FAXED TO [facility] FAX # [phone] AND ALSO FAXED [facility] [phone] Additional Follow-up by: Arta Bruce,  June 10, 2010 11:06 AM

## 2010-06-24 NOTE — Letter (Signed)
Summary: *HSN Results Follow up  [facility]  [address] [place], [place] [address]   Phone: [phone]  Fax: [phone]      05/13/2010   [facility] [address] [place], [place]  [address]   Dear  [name], WE HAVE BEEN TRYING TO REACH YOU ABOUT YOUR GI REFERRAL PLEASE, CALL [facility] PH # [phone] TO SCHEDULE AN APPT AND THE ADDRESS IS  [address] . THANK YOU FOR YOU ATTENTION AND HAVE A HAPPY & SAFETY HOLIDAYS                             _________________________________________________________ If you have any questions, please contact our office                     Sincerely,  Cheryll Dessert [facility]

## 2010-06-24 NOTE — Miscellaneous (Signed)
Summary: Moviprep RX  Clinical Lists Changes  Medications: Added new medication of MOVIPREP 100 GM  SOLR (PEG-KCL-NACL-NASULF-NA ASC-C) As per prep instructions. - Signed Rx of MOVIPREP 100 GM  SOLR (PEG-KCL-NACL-NASULF-NA ASC-C) As per prep instructions.;  #1 x 0;  Signed;  Entered by: Durwin Glaze RN;  Authorized by: Rachael Fee MD;  Method used: Electronically to [facility] [address] [address], [place], [place]  [id number], Ph: [phone], Fax: [phone]    Prescriptions: MOVIPREP 100 GM  SOLR (PEG-KCL-NACL-NASULF-NA ASC-C) As per prep instructions.  #1 x 0   Entered by:   Durwin Glaze RN   Authorized by:   Rachael Fee MD   Signed by:   Durwin Glaze RN on 06/10/2010   Method used:   Electronically to        [facility] (retail)       [address]       [place], [place]  [id number]       Ph: [phone]       Fax: [phone]   RxID:   [id number]

## 2010-06-24 NOTE — Letter (Signed)
Summary: Pre Visit Letter Revised  [facility]  [address] [place], [place] [address]   Phone: [phone]  Fax: [phone]        05/19/2010 MRN: [id number] [facility] [address] [place], [place]  [address]             Procedure Date:  06-29-10   Welcome to the Gastroenterology Division at [facility].    You are scheduled to see a nurse for your pre-procedure visit on 06-08-10 at 3:30p.m. on the 3rd floor at [facility], [address]  We ask that you try to arrive at our office 15 minutes prior to your appointment time to allow for check-in.  Please take a minute to review the attached form.  If you answer "Yes" to one or more of the questions on the first page, we ask that you call the person listed at your earliest opportunity.  If you answer "No" to all of the questions, please complete the rest of the form and bring it to your appointment.    Your nurse visit will consist of discussing your medical and surgical history, your immediate family medical history, and your medications.   If you are unable to list all of your medications on the form, please bring the medication bottles to your appointment and we will list them.  We will need to be aware of both prescribed and over the counter drugs.  We will need to know exact dosage information as well.    Please be prepared to read and sign documents such as consent forms, a financial agreement, and acknowledgement forms.  If necessary, and with your consent, a friend or relative is welcome to sit-in on the nurse visit with you.  Please bring your insurance card so that we may make a copy of it.  If your insurance requires a referral to see a specialist, please bring your referral form from your primary care physician.  No co-pay is required for this nurse visit.     If you cannot keep your appointment, please call [phone] to cancel or reschedule prior to your appointment date.  This  allows [place] the opportunity to schedule an appointment for another patient in need of care.    Thank you for choosing [facility] for your medical needs.  We appreciate the opportunity to care for you.  Please visit [place] at our website  to learn more about our practice.  Sincerely, The Gastroenterology Division

## 2010-06-24 NOTE — Letter (Signed)
Summary: [facility] Instructions  [facility]  [address] [place], [place] [address]   Phone: [phone]  Fax: [phone]       Marissa Brooks    10-Aug-1966    MRN: [id number]        Procedure Day /Date: Thursday 07/01/2010     Arrival Time: 8:30AM     Procedure Time: 10:30AM     Location of Procedure:                     _X_  [facility] ( Outpatient Registration)                        PREPARATION FOR COLONOSCOPY WITH MOVIPREP   Starting 5 days prior to your procedure 06/26/2010  do not eat nuts, seeds, popcorn, corn, beans, peas,  salads, or any raw vegetables.  Do not take any fiber supplements (e.g. Metamucil, Citrucel, and Benefiber).  THE DAY BEFORE YOUR PROCEDURE         DATE: 06/30/2010    DAY: Wednesday  1.  Drink clear liquids the entire day-NO SOLID FOOD  2.  Do not drink anything colored red or purple.  Avoid juices with pulp.  No orange juice.  3.  Drink at least 64 oz. (8 glasses) of fluid/clear liquids during the day to prevent dehydration and help the prep work efficiently.  CLEAR LIQUIDS INCLUDE: Water Jello Ice Popsicles Tea (sugar ok, no milk/cream) Powdered fruit flavored drinks Coffee (sugar ok, no milk/cream) Gatorade Juice: apple, white grape, white cranberry  Lemonade Clear bullion, consomm, broth Carbonated beverages (any kind) Strained chicken noodle soup Hard Candy                             4.  In the morning, mix first dose of MoviPrep solution:    Empty 1 Pouch A and 1 Pouch B into the disposable container    Add lukewarm drinking water to the top line of the container. Mix to dissolve    Refrigerate (mixed solution should be used within 24 hrs)  5.  Begin drinking the prep at 5:00 p.m. The MoviPrep container is divided by 4 marks.   Every 15 minutes drink the solution down to the next mark (approximately 8 oz) until the full liter is complete.   6.  Follow completed prep with 16 oz of clear liquid of your  choice (Nothing red or purple).  Continue to drink clear liquids until bedtime.  7.  Before going to bed, mix second dose of MoviPrep solution:    Empty 1 Pouch A and 1 Pouch B into the disposable container    Add lukewarm drinking water to the top line of the container. Mix to dissolve    Refrigerate  THE DAY OF YOUR PROCEDURE      DATE: 07/01/2010   DAY: Thursday  Beginning at 5:30AM (5 hours before procedure):         1. Every 15 minutes, drink the solution down to the next mark (approx 8 oz) until the full liter is complete.  2. Follow completed prep with 16 oz. of clear liquid of your choice.    3. You may drink clear liquids until 8:30AM (2 HOURS BEFORE PROCEDURE).   MEDICATION INSTRUCTIONS  Unless otherwise instructed, you should take regular prescription medications with a small sip of water   as early as 53  morning of your procedure.       OTHER INSTRUCTIONS  You will need a responsible adult at least 53 years of age to accompany you and drive you home.   This person must remain in the waiting room during your procedure.  Wear loose fitting clothing that is easily removed.  Leave jewelry and other valuables at home.  However, you may wish to bring a book to read or  an iPod/MP3 player to listen to music as you wait for your procedure to start.  Remove all body piercing jewelry and leave at home.  Total time from sign-in until discharge is approximately 2-3 hours.  You should go home directly after your procedure and rest.  You can resume normal activities the  day after your procedure.  The day of your procedure you should not:   Drive   Make legal decisions   Operate machinery   Drink alcohol   Return to work  You will receive specific instructions about eating, activities and medications before you leave.    The above instructions have been reviewed and explained to me by   Durwin Glaze RN  June 10, 2010 1:58 PM    I fully  understand and can verbalize these instructions _____________________________ Date _________

## 2010-06-24 NOTE — Letter (Signed)
Summary: [facility]  [facility]   Imported By: Lester Watson 06/15/2010 12:13:14  _____________________________________________________________________  External Attachment:    Type:   Image     Comment:   External Document

## 2010-06-29 ENCOUNTER — Other Ambulatory Visit: Payer: Self-pay | Admitting: Gastroenterology

## 2010-06-30 ENCOUNTER — Telehealth: Payer: Self-pay | Admitting: Gastroenterology

## 2010-07-01 ENCOUNTER — Other Ambulatory Visit: Payer: Self-pay | Admitting: Gastroenterology

## 2010-07-05 ENCOUNTER — Telehealth: Payer: Self-pay | Admitting: Gastroenterology

## 2010-07-08 NOTE — Progress Notes (Signed)
Summary: Hopsital procedure  Phone Note Call from Patient Call back at Home Phone [phone]   Caller: Patient Call For: Dr. Christella Hartigan Reason for Call: Talk to Nurse Summary of Call: Pt did not stop eating to prep for her procedure tomorrow and needs to reschedule Initial call taken by: [place] Johnson,  June 30, 2010 2:41 PM  Follow-up for Phone Call        tried to call pt back but was unable to get through, message states phone has been disconnected.  left a message at alternate number found in epic.  Tried work number and was told she was not available.  I called ENDO and spoke with Lanora Manis and she is trying to get in touch with her as well. Follow-up by: Chales Abrahams CMA Duncan Dull),  June 30, 2010 3:05 PM  Additional Follow-up for Phone Call Additional follow up Details #1::        pt was never able to be reached I tried all the numbers available.  Jill at [facility] endo was also notified.  I will reschedule when the pt is able to be reached. Additional Follow-up by: Chales Abrahams CMA (AAMA),  July 01, 2010 8:30 AM     Appended Document: Hopsital procedure she was scheduled as a "direct" colonoscopy.  I think she now needs ngi appt with me before scheduling her again for procedure to make sure she understands the prep instructions, diet, etc.

## 2010-07-14 NOTE — Progress Notes (Signed)
Summary: resch hosp colon   *** NEED A NEW NUMBER ***   Phone Note Call from Patient Call back at Home Phone [phone]   Caller: Patient Call For: Dr Arlyce Dice Reason for Call: Talk to Nurse Summary of Call: Patient wants to reschedule her colon that she cx on 2*9 for the hosp. Initial call taken by: Tawni Levy,  July 05, 2010 3:25 PM  Follow-up for Phone Call        number in the chart is incorrect, need a new number.  pt must have office visit before any testing.  Follow-up by: Chales Abrahams CMA Duncan Dull),  July 08, 2010 8:34 AM

## 2010-08-05 LAB — COMPREHENSIVE METABOLIC PANEL
ALT: 32 U/L (ref 0–35)
AST: 56 U/L — ABNORMAL HIGH (ref 0–37)
Albumin: 3.6 g/dL (ref 3.5–5.2)
Alkaline Phosphatase: 54 U/L (ref 39–117)
BUN: 12 mg/dL (ref 6–23)
CO2: 25 mEq/L (ref 19–32)
Calcium: 9.4 mg/dL (ref 8.4–10.5)
Chloride: 106 mEq/L (ref 96–112)
Creatinine, Ser: 0.84 mg/dL (ref 0.4–1.2)
GFR calc Af Amer: >60 mL/min (ref >60)
GFR calc non Af Amer: >60 mL/min (ref >60)
Glucose, Bld: 86 mg/dL (ref 70–99)
Potassium: 4.5 mEq/L (ref 3.5–5.1)
Sodium: 140 mEq/L (ref 135–145)
Total Bilirubin: 0.6 mg/dL (ref 0.3–1.2)
Total Protein: 7.2 g/dL (ref 6.0–8.3)

## 2010-08-05 LAB — DIFFERENTIAL
Basophils Absolute: 0 10*3/uL (ref 0.0–0.1)
Basophils Relative: 1 % (ref 0–1)
Eosinophils Absolute: 0.1 10*3/uL (ref 0.0–0.7)
Eosinophils Relative: 2 % (ref 0–5)
Lymphocytes Relative: 41 % (ref 12–46)
Lymphs Abs: 2.4 10*3/uL (ref 0.7–4.0)
Monocytes Absolute: 0.6 10*3/uL (ref 0.1–1.0)
Monocytes Relative: 11 % (ref 3–12)
Neutro Abs: 2.7 10*3/uL (ref 1.7–7.7)
Neutrophils Relative %: 45 % (ref 43–77)

## 2010-08-05 LAB — CBC
HCT: 37.1 % (ref 36.0–46.0)
Hemoglobin: 12.6 g/dL (ref 12.0–15.0)
MCH: 29.8 pg (ref 26.0–34.0)
MCHC: 34 g/dL (ref 30.0–36.0)
MCV: 87.7 fL (ref 78.0–100.0)
Platelets: 100 10*3/uL — ABNORMAL LOW (ref 150–400)
RBC: 4.23 MIL/uL (ref 3.87–5.11)
RDW: 14.9 % (ref 11.5–15.5)
WBC: 5.9 10*3/uL (ref 4.0–10.5)

## 2010-08-09 ENCOUNTER — Other Ambulatory Visit: Payer: Self-pay | Admitting: Internal Medicine

## 2010-08-09 DIAGNOSIS — N644 Mastodynia: Secondary | ICD-10-CM

## 2010-08-12 ENCOUNTER — Ambulatory Visit
Admission: RE | Admit: 2010-08-12 | Discharge: 2010-08-12 | Disposition: A | Discharge status: Home / Self Care | Payer: Medicare Other | Source: Ambulatory Visit | Attending: Internal Medicine | Admitting: Internal Medicine

## 2010-08-12 ENCOUNTER — Other Ambulatory Visit: Payer: Self-pay | Admitting: Internal Medicine

## 2010-08-12 ENCOUNTER — Ambulatory Visit: Payer: Medicare Other

## 2010-08-12 DIAGNOSIS — R921 Mammographic calcification found on diagnostic imaging of breast: Secondary | ICD-10-CM

## 2010-08-12 DIAGNOSIS — N644 Mastodynia: Secondary | ICD-10-CM

## 2010-08-17 ENCOUNTER — Other Ambulatory Visit: Payer: Self-pay | Admitting: Internal Medicine

## 2010-08-17 ENCOUNTER — Inpatient Hospital Stay: Admission: RE | Admit: 2010-08-17 | Payer: Medicare Other | Source: Ambulatory Visit

## 2010-08-17 DIAGNOSIS — R921 Mammographic calcification found on diagnostic imaging of breast: Secondary | ICD-10-CM

## 2010-08-18 ENCOUNTER — Ambulatory Visit
Admission: RE | Admit: 2010-08-18 | Discharge: 2010-08-18 | Disposition: A | Discharge status: Home / Self Care | Payer: Medicare Other | Source: Ambulatory Visit | Attending: Internal Medicine | Admitting: Internal Medicine

## 2010-08-18 ENCOUNTER — Other Ambulatory Visit: Payer: Self-pay | Admitting: Diagnostic Radiology

## 2010-08-18 ENCOUNTER — Other Ambulatory Visit: Payer: Self-pay | Admitting: Internal Medicine

## 2010-08-18 DIAGNOSIS — R921 Mammographic calcification found on diagnostic imaging of breast: Secondary | ICD-10-CM

## 2010-08-23 ENCOUNTER — Inpatient Hospital Stay (HOSPITAL_COMMUNITY)
Admission: RE | Admit: 2010-08-23 | Discharge: 2010-08-23 | Disposition: A | Discharge status: Home / Self Care | Payer: Medicare Other | Source: Ambulatory Visit | Attending: Family Medicine | Admitting: Family Medicine

## 2010-08-30 ENCOUNTER — Emergency Department (HOSPITAL_COMMUNITY)
Admission: EM | Admit: 2010-08-30 | Discharge: 2010-08-30 | Disposition: A | Discharge status: Home / Self Care | Payer: Medicare Other | Attending: Emergency Medicine | Admitting: Emergency Medicine

## 2010-08-30 DIAGNOSIS — N644 Mastodynia: Secondary | ICD-10-CM | POA: Insufficient documentation

## 2010-08-30 DIAGNOSIS — Z79899 Other long term (current) drug therapy: Secondary | ICD-10-CM | POA: Insufficient documentation

## 2010-08-30 DIAGNOSIS — K7460 Unspecified cirrhosis of liver: Secondary | ICD-10-CM | POA: Insufficient documentation

## 2010-08-30 DIAGNOSIS — Z853 Personal history of malignant neoplasm of breast: Secondary | ICD-10-CM | POA: Insufficient documentation

## 2010-08-30 DIAGNOSIS — S2000XA Contusion of breast, unspecified breast, initial encounter: Secondary | ICD-10-CM | POA: Insufficient documentation

## 2010-08-30 DIAGNOSIS — B199 Unspecified viral hepatitis without hepatic coma: Secondary | ICD-10-CM | POA: Insufficient documentation

## 2010-08-30 DIAGNOSIS — Y849 Medical procedure, unspecified as the cause of abnormal reaction of the patient, or of later complication, without mention of misadventure at the time of the procedure: Secondary | ICD-10-CM | POA: Insufficient documentation

## 2010-08-30 LAB — HEPATIC FUNCTION PANEL
ALT: 23 U/L (ref 0–35)
AST: 28 U/L (ref 0–37)
Albumin: 3.7 g/dL (ref 3.5–5.2)
Alkaline Phosphatase: 50 U/L (ref 39–117)
Bilirubin, Direct: <0.1 mg/dL (ref 0.0–0.3)
Total Bilirubin: 0.4 mg/dL (ref 0.3–1.2)
Total Protein: 7 g/dL (ref 6.0–8.3)

## 2010-08-30 LAB — PROTIME-INR
INR: 1.03 (ref 0.00–1.49)
Prothrombin Time: 13.7 seconds (ref 11.6–15.2)

## 2010-09-30 ENCOUNTER — Other Ambulatory Visit: Payer: Self-pay | Admitting: General Surgery

## 2010-09-30 DIAGNOSIS — C50919 Malignant neoplasm of unspecified site of unspecified female breast: Secondary | ICD-10-CM

## 2010-10-21 ENCOUNTER — Encounter (HOSPITAL_COMMUNITY)
Admission: RE | Admit: 2010-10-21 | Discharge: 2010-10-21 | Disposition: A | Discharge status: Home / Self Care | Payer: Medicare Other | Source: Ambulatory Visit | Attending: General Surgery | Admitting: General Surgery

## 2010-10-21 ENCOUNTER — Other Ambulatory Visit (INDEPENDENT_AMBULATORY_CARE_PROVIDER_SITE_OTHER): Payer: Self-pay | Admitting: General Surgery

## 2010-10-21 ENCOUNTER — Ambulatory Visit (HOSPITAL_COMMUNITY)
Admission: RE | Admit: 2010-10-21 | Discharge: 2010-10-21 | Disposition: A | Discharge status: Home / Self Care | Payer: Medicare Other | Source: Ambulatory Visit | Attending: General Surgery | Admitting: General Surgery

## 2010-10-21 DIAGNOSIS — Z01818 Encounter for other preprocedural examination: Secondary | ICD-10-CM | POA: Insufficient documentation

## 2010-10-21 DIAGNOSIS — Z01810 Encounter for preprocedural cardiovascular examination: Secondary | ICD-10-CM | POA: Insufficient documentation

## 2010-10-21 DIAGNOSIS — Z01811 Encounter for preprocedural respiratory examination: Secondary | ICD-10-CM | POA: Insufficient documentation

## 2010-10-21 DIAGNOSIS — Z01812 Encounter for preprocedural laboratory examination: Secondary | ICD-10-CM | POA: Insufficient documentation

## 2010-10-21 DIAGNOSIS — D249 Benign neoplasm of unspecified breast: Secondary | ICD-10-CM | POA: Insufficient documentation

## 2010-10-21 LAB — COMPREHENSIVE METABOLIC PANEL
ALT: 22 U/L (ref 0–35)
AST: 21 U/L (ref 0–37)
Albumin: 3.8 g/dL (ref 3.5–5.2)
CO2: 28 mEq/L (ref 19–32)
Calcium: 10 mg/dL (ref 8.4–10.5)
Creatinine, Ser: 0.67 mg/dL (ref 0.4–1.2)
GFR calc Af Amer: >60 mL/min (ref >60)
Sodium: 140 mEq/L (ref 135–145)
Total Protein: 7.5 g/dL (ref 6.0–8.3)

## 2010-10-21 LAB — SURGICAL PCR SCREEN: MRSA, PCR: NEGATIVE

## 2010-10-21 LAB — CBC
HCT: 37.4 % (ref 36.0–46.0)
MCHC: 33.7 g/dL (ref 30.0–36.0)
Platelets: 136 10*3/uL — ABNORMAL LOW (ref 150–400)
RDW: 14.3 % (ref 11.5–15.5)
WBC: 6.4 10*3/uL (ref 4.0–10.5)

## 2010-10-21 LAB — HCG, SERUM, QUALITATIVE: Preg, Serum: NEGATIVE

## 2010-10-21 LAB — DIFFERENTIAL
Basophils Absolute: 0 10*3/uL (ref 0.0–0.1)
Basophils Relative: 1 % (ref 0–1)
Eosinophils Relative: 2 % (ref 0–5)
Lymphocytes Relative: 42 % (ref 12–46)
Monocytes Absolute: 0.4 10*3/uL (ref 0.1–1.0)

## 2010-10-26 ENCOUNTER — Ambulatory Visit (HOSPITAL_COMMUNITY)
Admission: RE | Admit: 2010-10-26 | Discharge: 2010-10-26 | Disposition: A | Discharge status: Home / Self Care | Payer: Medicare Other | Source: Ambulatory Visit | Attending: General Surgery | Admitting: General Surgery

## 2010-10-26 ENCOUNTER — Ambulatory Visit
Admission: RE | Admit: 2010-10-26 | Discharge: 2010-10-26 | Disposition: A | Discharge status: Home / Self Care | Payer: Medicare Other | Source: Ambulatory Visit | Attending: General Surgery | Admitting: General Surgery

## 2010-10-26 ENCOUNTER — Other Ambulatory Visit (INDEPENDENT_AMBULATORY_CARE_PROVIDER_SITE_OTHER): Payer: Self-pay | Admitting: General Surgery

## 2010-10-26 DIAGNOSIS — Z01810 Encounter for preprocedural cardiovascular examination: Secondary | ICD-10-CM | POA: Insufficient documentation

## 2010-10-26 DIAGNOSIS — Z01818 Encounter for other preprocedural examination: Secondary | ICD-10-CM | POA: Insufficient documentation

## 2010-10-26 DIAGNOSIS — D249 Benign neoplasm of unspecified breast: Secondary | ICD-10-CM | POA: Insufficient documentation

## 2010-10-26 DIAGNOSIS — C50919 Malignant neoplasm of unspecified site of unspecified female breast: Secondary | ICD-10-CM

## 2010-10-26 DIAGNOSIS — Z01812 Encounter for preprocedural laboratory examination: Secondary | ICD-10-CM | POA: Insufficient documentation

## 2010-11-08 NOTE — Op Note (Signed)
  [name], Marissa Brooks                 ACCOUNT NO.:  [id number]  MEDICAL RECORD NO.:  [id number]  LOCATION:  SDSC                         FACILITY:  [facility]  PHYSICIAN:  Ollen Gross. Vernell Morgans, M.D. DATE OF BIRTH:  [birth year]  DATE OF PROCEDURE:  10/26/2010 DATE OF DISCHARGE:  10/26/2010                              OPERATIVE REPORT   PREOPERATIVE DIAGNOSIS:  Right breast papilloma.  POSTOPERATIVE DIAGNOSIS:  Right breast papilloma.  PROCEDURE:  Right breast needle-localized lumpectomy.  SURGEON:  Ollen Gross. Vernell Morgans, MD  ANESTHESIA:  General via LMA.  PROCEDURE:  After informed consent was obtained, the patient was brought to the operating room, placed in supine position on the operating room table.  After adequate induction of general anesthesia, the patient's right breast and chest area were prepped with poor prep, allowed to dry and draped in usual sterile manner.  Earlier in the day, the patient undergone a wire localization procedure and the wire was entering the lower-outer aspect of the right breast inferiorly and headed superiorly. A radial-type incision was made overlying the path of the wire with a 15 blade knife after the skin was infiltrated with 0.25% Marcaine.  The incision was carried down through the skin and subcutaneous tissue sharply with the electrocautery until the breast tissue was entered. Once into the breast tissue, the path of the wire could be palpated.  A circular portion of breast tissue was excised sharply with the electrocautery around the path of the wire.  Once this was accomplished, the specimen was removed fro the patient and oriented with a short stitch superior and long stitch lateral.  Specimen radiograph showed the calcifications in clip to be within the specimen and was then sent to pathology for further evaluation.  Hemostasis was achieved using Bovie electrocautery.  The wound was irrigated with copious amounts of saline and infiltrated with 0.25%  marcaine.  The deep layer of the wound was then closed with interrupted 3-0 Vicryl stitches and skin was closed with interrupted 4-0 Monocryl subcuticular stitches.  Dermabond dressings were applied.  The patient tolerated the procedure well.  At the end of the case, all needle, sponge, and instrument count were correct.  The patient was then awakened and taken to recovery in stable condition.     Ollen Gross. Vernell Morgans, M.D.     PST/MEDQ  D:  10/26/2010  T:  10/27/2010  Job:  [id number]  Electronically Signed by Chevis Pretty III M.D. on 11/08/2010 07:32:09 AM

## 2010-12-08 ENCOUNTER — Encounter (INDEPENDENT_AMBULATORY_CARE_PROVIDER_SITE_OTHER): Payer: Self-pay | Admitting: General Surgery

## 2010-12-09 ENCOUNTER — Ambulatory Visit (INDEPENDENT_AMBULATORY_CARE_PROVIDER_SITE_OTHER): Payer: Medicare Other | Admitting: General Surgery

## 2010-12-09 ENCOUNTER — Encounter (INDEPENDENT_AMBULATORY_CARE_PROVIDER_SITE_OTHER): Payer: Self-pay | Admitting: General Surgery

## 2010-12-09 DIAGNOSIS — D249 Benign neoplasm of unspecified breast: Secondary | ICD-10-CM

## 2010-12-09 NOTE — Patient Instructions (Signed)
Continue regular self exams Continue to follow up regularly with medical doctor

## 2010-12-20 NOTE — Progress Notes (Signed)
Subjective:     Patient ID: Marissa Brooks, female   DOB: 10-Jan-1956, 55 y.o.   MRN: [id number]  HPI  The patient is a 55 year old black female who is now about 6 weeks out from a right breast lumpectomy. Her pathology showed an intraductal papilloma but no atypia or malignancy. She has no complaints at this point. Review of Systems     Objective:   Physical Exam On exam her right breast incision has healed up nicely. There is no sign of infection.    Assessment:     Intraductal papilloma.    Plan:     At this point I think she can return to all her normal activities without any restrictions. She can return to her normal screening schedule with her medical doctor. We will plan to see her back on prn basis.

## 2011-02-21 ENCOUNTER — Other Ambulatory Visit: Payer: Self-pay | Admitting: Internal Medicine

## 2011-02-21 DIAGNOSIS — Z9889 Other specified postprocedural states: Secondary | ICD-10-CM

## 2011-02-21 DIAGNOSIS — C50919 Malignant neoplasm of unspecified site of unspecified female breast: Secondary | ICD-10-CM

## 2011-02-21 DIAGNOSIS — Z9012 Acquired absence of left breast and nipple: Secondary | ICD-10-CM

## 2011-03-03 ENCOUNTER — Ambulatory Visit
Admission: RE | Admit: 2011-03-03 | Discharge: 2011-03-03 | Disposition: A | Discharge status: Home / Self Care | Payer: Medicare Other | Source: Ambulatory Visit | Attending: Internal Medicine | Admitting: Internal Medicine

## 2011-03-03 DIAGNOSIS — C50919 Malignant neoplasm of unspecified site of unspecified female breast: Secondary | ICD-10-CM

## 2011-03-03 DIAGNOSIS — Z9012 Acquired absence of left breast and nipple: Secondary | ICD-10-CM

## 2011-03-03 DIAGNOSIS — Z9889 Other specified postprocedural states: Secondary | ICD-10-CM

## 2011-11-23 ENCOUNTER — Emergency Department (HOSPITAL_COMMUNITY)
Admission: EM | Admit: 2011-11-23 | Discharge: 2011-11-23 | Disposition: A | Discharge status: Home / Self Care | Payer: Medicare Other | Attending: Emergency Medicine | Admitting: Emergency Medicine

## 2011-11-23 ENCOUNTER — Encounter (HOSPITAL_COMMUNITY): Payer: Self-pay | Admitting: Radiology

## 2011-11-23 DIAGNOSIS — Z853 Personal history of malignant neoplasm of breast: Secondary | ICD-10-CM | POA: Insufficient documentation

## 2011-11-23 DIAGNOSIS — F17200 Nicotine dependence, unspecified, uncomplicated: Secondary | ICD-10-CM | POA: Insufficient documentation

## 2011-11-23 DIAGNOSIS — R21 Rash and other nonspecific skin eruption: Secondary | ICD-10-CM

## 2011-11-23 DIAGNOSIS — Z9010 Acquired absence of unspecified breast and nipple: Secondary | ICD-10-CM | POA: Insufficient documentation

## 2011-11-23 DIAGNOSIS — Z803 Family history of malignant neoplasm of breast: Secondary | ICD-10-CM | POA: Insufficient documentation

## 2011-11-23 MED ORDER — HYDROXYZINE HCL 25 MG PO TABS: 25.0000 mg | ORAL_TABLET | Freq: Four times a day (QID) | ORAL | Status: AC | PRN

## 2011-11-23 NOTE — ED Notes (Signed)
Pt presents with a rash X 3 weeks

## 2011-11-23 NOTE — ED Provider Notes (Signed)
History     CSN: [id number]  Arrival date & time 11/23/11  0904   First MD Initiated Contact with Patient 11/23/11 1018      Chief Complaint  Patient presents with  . Rash    (Consider location/radiation/quality/duration/timing/severity/associated sxs/prior treatment) Patient is a 56 y.o. female presenting with rash. The history is provided by the patient.  Rash  This is a recurrent problem. Episode onset: 3 weeks ago. Progression since onset: has changed locations but not worsened. The problem is associated with an unknown factor. There has been no fever. Affected Location: has been on extremities, abdomen, chest in the past. Currently only to the upper back. The patient is experiencing no pain. Associated symptoms include itching. Pertinent negatives include no blisters and no weeping. She has tried nothing for the symptoms.    Past Medical History  Diagnosis Date  . Cancer 2002    left breast cancer tx in [place]    Past Surgical History  Procedure Date  . Mastectomy     and Implant  . Mandible fracture surgery   . Breast surgery 2002    left mastectomy with implant    Family History  Problem Relation Age of Onset  . Breast cancer Sister     History  Substance Use Topics  . Smoking status: Current Everyday Smoker -- 0.2 packs/day  . Smokeless tobacco: Never Used  . Alcohol Use: Yes     Review of Systems  Constitutional: Negative for fever and chills.  HENT: Negative for neck stiffness.   Musculoskeletal: Negative for joint swelling.  Skin: Positive for itching and rash.  Neurological: Negative for headaches.    Allergies  Centrum  Home Medications   Current Outpatient Rx  Name Route Sig Dispense Refill  . ARIPIPRAZOLE 5 MG PO TABS Oral Take 5 mg by mouth daily.    . IBUPROFEN 200 MG PO TABS Oral Take 400 mg by mouth every 6 (six) hours as needed. For headaches    . SERTRALINE HCL 100 MG PO TABS Oral Take 100 mg by mouth 2 (two) times daily.      . TRAZODONE HCL 100 MG PO TABS Oral Take 100 mg by mouth at bedtime as needed. For sleep      BP 129/79  Pulse 66  Temp 98.8 F (37.1 C) (Oral)  Resp 16  SpO2 99%  Physical Exam  Nursing note and vitals reviewed. Constitutional: She appears well-developed and well-nourished. No distress.  HENT:  Head: Normocephalic.       MMM  Neck: Neck supple.  Cardiovascular: Normal rate and regular rhythm.        .Bilateral radial and DP pulses are 2+  Pulmonary/Chest: Effort normal. No respiratory distress.  Abdominal: Soft. She exhibits no distension. There is no tenderness.  Musculoskeletal: She exhibits no edema.  Neurological: She is alert.  Skin: Skin is warm and dry. Rash noted. No petechiae and no purpura noted. Rash is papular.     Psychiatric: She has a normal mood and affect.    ED Course  Procedures (including critical care time)  Labs Reviewed - No data to display No results found.   Dx 1: Rash   MDM  Pruritic migratory rash x 3 weeks. No fever, neck stiffness, HA. No new medications. Spares palms and soles. No one else in the home. No recent stay in hotel or jail. On exam, rash appearance most c/w insect bites. Discussed antihistamine tx and PCP follow-up if no improvement. Limited distribution  at this time does not warrant steroid course.         Shaaron Adler, PA-C 11/23/11 1050

## 2011-11-24 NOTE — ED Provider Notes (Signed)
Medical screening examination/treatment/procedure(s) were performed by non-physician practitioner and as supervising physician I was immediately available for consultation/collaboration.   Halimah Bewick, MD 11/24/11 1321 

## 2012-01-26 ENCOUNTER — Other Ambulatory Visit: Payer: Self-pay | Admitting: Internal Medicine

## 2012-01-26 DIAGNOSIS — Z9012 Acquired absence of left breast and nipple: Secondary | ICD-10-CM

## 2012-01-26 DIAGNOSIS — Z1231 Encounter for screening mammogram for malignant neoplasm of breast: Secondary | ICD-10-CM

## 2012-02-14 ENCOUNTER — Emergency Department (INDEPENDENT_AMBULATORY_CARE_PROVIDER_SITE_OTHER)
Admission: EM | Admit: 2012-02-14 | Discharge: 2012-02-14 | Disposition: A | Discharge status: Home / Self Care | Payer: Medicare Other | Source: Home / Self Care | Attending: Family Medicine | Admitting: Family Medicine

## 2012-02-14 ENCOUNTER — Encounter (HOSPITAL_COMMUNITY): Payer: Self-pay | Admitting: Emergency Medicine

## 2012-02-14 ENCOUNTER — Other Ambulatory Visit: Payer: Self-pay

## 2012-02-14 DIAGNOSIS — R0789 Other chest pain: Secondary | ICD-10-CM

## 2012-02-14 DIAGNOSIS — R071 Chest pain on breathing: Secondary | ICD-10-CM

## 2012-02-14 DIAGNOSIS — T148XXA Other injury of unspecified body region, initial encounter: Secondary | ICD-10-CM

## 2012-02-14 MED ORDER — CYCLOBENZAPRINE HCL 10 MG PO TABS: 10.0000 mg | ORAL_TABLET | Freq: Two times a day (BID) | ORAL | Status: DC | PRN

## 2012-02-14 MED ORDER — NAPROXEN 500 MG PO TABS: 500.0000 mg | ORAL_TABLET | Freq: Two times a day (BID) | ORAL | Status: DC

## 2012-02-14 NOTE — ED Notes (Signed)
Ekg results have been given to C. Chatten, NP.

## 2012-02-14 NOTE — ED Notes (Signed)
Pt c/o pain under right side of breast/chest x1 days... Sx include: pain when she inhales and coughs, SOB, painful when she moves her right arm... She denies: Headaches, blurry vision, edema, does not recall any stranous movement/injury.

## 2012-02-14 NOTE — ED Provider Notes (Signed)
History     CSN: [id number]  Arrival date & time 02/14/12  1445   First MD Initiated Contact with Patient 02/14/12 1529      Chief Complaint  Patient presents with  . Chest Pain    (Consider location/radiation/quality/duration/timing/severity/associated sxs/prior treatment) Patient is a 56 y.o. female presenting with chest pain. The history is provided by the patient.  Chest Pain Primary symptoms include cough. Pertinent negatives for primary symptoms include no shortness of breath, no wheezing and no palpitations.   Patient complains of right chest pain for two days associated with tenderness with palpation.  Pain is worse with movement and cough.   States pain is intermittent in nature, no known injury, reports no change in normal daily activities.  Denies heavy lifting or involvement in sports participation.  Denies n/v/d or abdominal pain, states pain does not radiate.  Past Medical History  Diagnosis Date  . Cancer 2002    left breast cancer tx in [place]    Past Surgical History  Procedure Date  . Mastectomy     and Implant  . Mandible fracture surgery   . Breast surgery 2002    left mastectomy with implant    Family History  Problem Relation Age of Onset  . Breast cancer Sister     History  Substance Use Topics  . Smoking status: Current Every Day Smoker -- 0.2 packs/day  . Smokeless tobacco: Never Used  . Alcohol Use: Yes    OB History    Grav Para Term Preterm Abortions TAB SAB Ect Mult Living                  Review of Systems  Constitutional: Negative.   Respiratory: Positive for cough. Negative for choking, chest tightness, shortness of breath, wheezing and stridor.   Cardiovascular: Positive for chest pain. Negative for palpitations and leg swelling.  Gastrointestinal: Negative.     Allergies  Centrum  Home Medications   Current Outpatient Rx  Name Route Sig Dispense Refill  . ARIPIPRAZOLE 5 MG PO TABS Oral Take 5 mg by mouth  daily.    . SERTRALINE HCL 100 MG PO TABS Oral Take 100 mg by mouth 2 (two) times daily.    . CYCLOBENZAPRINE HCL 10 MG PO TABS Oral Take 1 tablet (10 mg total) by mouth 2 (two) times daily as needed for muscle spasms. 20 tablet 0  . IBUPROFEN 200 MG PO TABS Oral Take 400 mg by mouth every 6 (six) hours as needed. For headaches    . NAPROXEN 500 MG PO TABS Oral Take 1 tablet (500 mg total) by mouth 2 (two) times daily. 30 tablet 0  . TRAZODONE HCL 100 MG PO TABS Oral Take 100 mg by mouth at bedtime as needed. For sleep      BP 140/76  Pulse 62  Temp 98.4 F (36.9 C) (Oral)  Resp 22  SpO2 96%  Physical Exam  Nursing note and vitals reviewed. Constitutional: She is oriented to person, place, and time. Vital signs are normal. She appears well-developed and well-nourished. She is active and cooperative.  HENT:  Head: Normocephalic.  Eyes: Conjunctivae normal are normal. Pupils are equal, round, and reactive to light. No scleral icterus.  Neck: Trachea normal. Neck supple. No thyromegaly present.  Cardiovascular: Normal rate, regular rhythm, normal heart sounds, intact distal pulses and normal pulses.   Pulmonary/Chest: Effort normal and breath sounds normal. She exhibits tenderness. She exhibits no bony tenderness.    Musculoskeletal:  Right shoulder: Normal.       Cervical back: Normal.       Thoracic back: She exhibits tenderness. She exhibits normal range of motion, no bony tenderness, no swelling, no edema, no deformity, no laceration and no pain.       Lumbar back: Normal.       Back:  Lymphadenopathy:    She has no cervical adenopathy.  Neurological: She is alert and oriented to person, place, and time. No cranial nerve deficit or sensory deficit.  Skin: Skin is warm and dry.  Psychiatric: She has a normal mood and affect. Her speech is normal and behavior is normal. Judgment and thought content normal. Cognition and memory are normal.    ED Course  Procedures  (including critical care time)  Labs Reviewed - No data to display No results found.   1. Right-sided chest wall pain   2. Muscle strain       MDM  Warm compresses to affected area. NSAIDS and muscle relaxants.         Johnsie Kindred, NP 02/15/12 1435

## 2012-02-17 NOTE — ED Provider Notes (Signed)
Medical screening examination/treatment/procedure(s) were performed by resident physician or non-physician practitioner and as supervising physician I was immediately available for consultation/collaboration.   KINDL,JAMES DOUGLAS MD.    James D Kindl, MD 02/17/12 0935 

## 2012-03-02 ENCOUNTER — Ambulatory Visit: Payer: Medicare Other

## 2012-03-29 ENCOUNTER — Ambulatory Visit: Payer: Medicare Other

## 2012-04-12 ENCOUNTER — Encounter (HOSPITAL_COMMUNITY): Payer: Self-pay | Admitting: *Deleted

## 2012-04-12 ENCOUNTER — Emergency Department (INDEPENDENT_AMBULATORY_CARE_PROVIDER_SITE_OTHER)
Admission: EM | Admit: 2012-04-12 | Discharge: 2012-04-12 | Disposition: A | Discharge status: Home / Self Care | Payer: Medicare Other | Source: Home / Self Care | Attending: Family Medicine | Admitting: Family Medicine

## 2012-04-12 DIAGNOSIS — R040 Epistaxis: Secondary | ICD-10-CM

## 2012-04-12 NOTE — ED Notes (Signed)
Pt      Called    Stating    The  Clindamycin is  Making  Her  Sick   Dr  Ladon Applebaum  Notified  chage  To  Doxy 100 mh  Bid  For  10  Days  To brandty  At  [facility]

## 2012-04-12 NOTE — ED Notes (Signed)
Pt  Reports  Has  Had  intermittant episodes  Of nosebleed   For over  1  Month  -  She  Is  Not bleeding  At  This  Time -  She  Reports  However  That  She  Did  blled  This  Am   -  She  Is  Sitting  Upright on  Exam table  Speaking in  Complete  sentances  And  Is  In no  Distress

## 2012-04-12 NOTE — ED Provider Notes (Signed)
History     CSN: [id number]  Arrival date & time 04/12/12  1130   First MD Initiated Contact with Patient 04/12/12 1138      Chief Complaint  Patient presents with  . Epistaxis    (Consider location/radiation/quality/duration/timing/severity/associated sxs/prior treatment) HPI Comments: 56 year old smoker female with history of being a breast cancer survival (2002) and mood disorder. Here complaining of intermittent nosebleeds during the last 3-4 weeks. Patient reports that her nosebleeds usually start after blowing her nose and are brief lasting less than 3 minutes and self resolve. She denies sinus tenderness or congestion. Denies rhinorrhea associated with the bleeding. Denies headache or visual changes. No nausea or vomiting. No cough or congestion. No balance or gait problems. She helps to stop the bleeding by extending her head backwards. Not taking aspirin or blood thinners. Had similar symptoms during wintertime in the past. Denies other types of mucosal bleeding. No melena. No bruising or petechia or skin purpuric lessions. No abdominal pain.   Past Medical History  Diagnosis Date  . Cancer 2002    left breast cancer tx in [place]    Past Surgical History  Procedure Date  . Mastectomy     and Implant  . Mandible fracture surgery   . Breast surgery 2002    left mastectomy with implant    Family History  Problem Relation Age of Onset  . Breast cancer Sister     History  Substance Use Topics  . Smoking status: Current Every Day Smoker -- 0.2 packs/day  . Smokeless tobacco: Never Used  . Alcohol Use: Yes    OB History    Grav Para Term Preterm Abortions TAB SAB Ect Mult Living                  Review of Systems  Constitutional: Negative for fever, chills, diaphoresis, activity change, appetite change, fatigue and unexpected weight change.  HENT: Positive for nosebleeds. Negative for sore throat, rhinorrhea, mouth sores, trouble swallowing and sinus  pressure.   Eyes: Negative for visual disturbance.  Gastrointestinal: Negative for nausea, vomiting, abdominal pain and blood in stool.  Genitourinary: Negative for hematuria.  Musculoskeletal: Negative for myalgias and arthralgias.  Skin: Negative for rash.  Neurological: Negative for dizziness and headaches.  Hematological: Negative for adenopathy.  All other systems reviewed and are negative.    Allergies  Centrum  Home Medications   Current Outpatient Rx  Name  Route  Sig  Dispense  Refill  . ARIPIPRAZOLE 5 MG PO TABS   Oral   Take 5 mg by mouth daily.         . CYCLOBENZAPRINE HCL 10 MG PO TABS   Oral   Take 1 tablet (10 mg total) by mouth 2 (two) times daily as needed for muscle spasms.   20 tablet   0   . IBUPROFEN 200 MG PO TABS   Oral   Take 400 mg by mouth every 6 (six) hours as needed. For headaches         . NAPROXEN 500 MG PO TABS   Oral   Take 1 tablet (500 mg total) by mouth 2 (two) times daily.   30 tablet   0   . SERTRALINE HCL 100 MG PO TABS   Oral   Take 100 mg by mouth 2 (two) times daily.         . TRAZODONE HCL 100 MG PO TABS   Oral   Take 100 mg by mouth at  bedtime as needed. For sleep           BP 164/78  Pulse 60  Temp 98.1 F (36.7 C) (Oral)  Resp 18  SpO2 98%  Physical Exam  Nursing note and vitals reviewed. Constitutional: She is oriented to person, place, and time. She appears well-developed and well-nourished. No distress.  HENT:  Head: Normocephalic and atraumatic.  Right Ear: External ear normal.  Left Ear: External ear normal.  Mouth/Throat: Oropharynx is clear and moist. No oropharyngeal exudate.       Nose: erythema and swelling of nasal turbinates. No rhinorrhea. There is irritation and erosion of the nasal mucosa at septum bilaterally. Small dry blood clot on right nasal septum. No active bleeding.  No postnasal bleeding.   Eyes: Conjunctivae normal are normal. Right eye exhibits no discharge. Left eye  exhibits no discharge. No scleral icterus.  Neck: Neck supple. No thyromegaly present.  Cardiovascular: Normal rate, regular rhythm and normal heart sounds.   Pulmonary/Chest: Effort normal and breath sounds normal.  Abdominal: Soft. There is no tenderness.       No splenomegaly.  Lymphadenopathy:    She has no cervical adenopathy.  Neurological: She is alert and oriented to person, place, and time.  Skin: No rash noted. She is not diaphoretic.       No bruising or purpuric lesions.     ED Course  Procedures (including critical care time)  Labs Reviewed - No data to display No results found.   1. Anterior epistaxis       MDM  No currently bleeding. Bleeding points observed on the right nasal septum with no active bleeding. Encouraged lubrication inside nostrils with a lipstick. Placed a lubricated gauze in the right nostril. Supportive care and red flags that should prompt her return to medical attention discussed with patient and provided in writing. Asked to followup with her primary care provider or ENT specialist if recurrent or worsening symptoms.        Sharin Grave, MD 04/14/12 1052

## 2012-04-12 NOTE — ED Notes (Signed)
BLEEDING  REOCCRED  ICE  PACK APPLIED

## 2012-04-16 ENCOUNTER — Ambulatory Visit
Admission: RE | Admit: 2012-04-16 | Discharge: 2012-04-16 | Disposition: A | Discharge status: Home / Self Care | Payer: Medicare Other | Source: Ambulatory Visit | Attending: Internal Medicine | Admitting: Internal Medicine

## 2012-04-16 DIAGNOSIS — Z9012 Acquired absence of left breast and nipple: Secondary | ICD-10-CM

## 2012-04-16 DIAGNOSIS — Z1231 Encounter for screening mammogram for malignant neoplasm of breast: Secondary | ICD-10-CM

## 2013-03-19 ENCOUNTER — Other Ambulatory Visit: Payer: Self-pay

## 2013-03-19 DIAGNOSIS — Z9012 Acquired absence of left breast and nipple: Secondary | ICD-10-CM

## 2013-03-19 DIAGNOSIS — Z1231 Encounter for screening mammogram for malignant neoplasm of breast: Secondary | ICD-10-CM

## 2013-03-19 DIAGNOSIS — Z853 Personal history of malignant neoplasm of breast: Secondary | ICD-10-CM

## 2013-04-17 ENCOUNTER — Ambulatory Visit
Admission: RE | Admit: 2013-04-17 | Discharge: 2013-04-17 | Disposition: A | Discharge status: Home / Self Care | Payer: Medicare Other | Source: Ambulatory Visit

## 2013-04-17 DIAGNOSIS — Z9012 Acquired absence of left breast and nipple: Secondary | ICD-10-CM

## 2013-04-17 DIAGNOSIS — Z1231 Encounter for screening mammogram for malignant neoplasm of breast: Secondary | ICD-10-CM

## 2013-04-17 DIAGNOSIS — Z853 Personal history of malignant neoplasm of breast: Secondary | ICD-10-CM

## 2013-06-12 ENCOUNTER — Encounter (HOSPITAL_COMMUNITY): Payer: Self-pay | Admitting: Emergency Medicine

## 2013-06-12 ENCOUNTER — Emergency Department (HOSPITAL_COMMUNITY)
Admission: EM | Admit: 2013-06-12 | Discharge: 2013-06-12 | Discharge status: Against Medical Advice | Payer: Medicare HMO | Attending: Emergency Medicine | Admitting: Emergency Medicine

## 2013-06-12 DIAGNOSIS — F17200 Nicotine dependence, unspecified, uncomplicated: Secondary | ICD-10-CM | POA: Insufficient documentation

## 2013-06-12 DIAGNOSIS — F411 Generalized anxiety disorder: Secondary | ICD-10-CM | POA: Insufficient documentation

## 2013-06-12 NOTE — ED Notes (Signed)
Pt BIB EMS. Pt states she took one extra Zoloft tonight and called EMS. Pt would not say why she took the extra med. Pt has ETOH on breath per EMS. Pt ambulatory with steady, independent gait to exam room.

## 2013-06-12 NOTE — ED Notes (Signed)
Patient is alert and oriented x3.  She states that she is wanting to be seen for emotional distress. Patient states that she took 2 extra zoloft over her prescription.  She states that she has a headache That she rates 6 of 10.

## 2013-07-08 ENCOUNTER — Encounter (HOSPITAL_COMMUNITY): Payer: Self-pay | Admitting: *Deleted

## 2013-07-08 ENCOUNTER — Encounter (HOSPITAL_COMMUNITY): Payer: Self-pay | Admitting: Emergency Medicine

## 2013-07-08 ENCOUNTER — Inpatient Hospital Stay (HOSPITAL_COMMUNITY)
Admission: AD | Admit: 2013-07-08 | Discharge: 2013-07-12 | DRG: 897 | Disposition: A | Discharge status: Home / Self Care | Payer: Medicare HMO | Source: Intra-hospital | Attending: Psychiatry | Admitting: Psychiatry

## 2013-07-08 ENCOUNTER — Emergency Department (HOSPITAL_COMMUNITY)
Admission: EM | Admit: 2013-07-08 | Discharge: 2013-07-08 | Disposition: A | Discharge status: Other Acute Inpatient Hospital | Payer: Medicare HMO | Source: Home / Self Care | Attending: Emergency Medicine | Admitting: Emergency Medicine

## 2013-07-08 DIAGNOSIS — F1014 Alcohol abuse with alcohol-induced mood disorder: Secondary | ICD-10-CM | POA: Diagnosis present

## 2013-07-08 DIAGNOSIS — Z803 Family history of malignant neoplasm of breast: Secondary | ICD-10-CM

## 2013-07-08 DIAGNOSIS — F411 Generalized anxiety disorder: Secondary | ICD-10-CM | POA: Diagnosis present

## 2013-07-08 DIAGNOSIS — F17200 Nicotine dependence, unspecified, uncomplicated: Secondary | ICD-10-CM | POA: Diagnosis present

## 2013-07-08 DIAGNOSIS — F1020 Alcohol dependence, uncomplicated: Secondary | ICD-10-CM | POA: Diagnosis present

## 2013-07-08 DIAGNOSIS — Z853 Personal history of malignant neoplasm of breast: Secondary | ICD-10-CM | POA: Diagnosis not present

## 2013-07-08 DIAGNOSIS — F329 Major depressive disorder, single episode, unspecified: Secondary | ICD-10-CM | POA: Diagnosis present

## 2013-07-08 DIAGNOSIS — F1420 Cocaine dependence, uncomplicated: Secondary | ICD-10-CM | POA: Diagnosis present

## 2013-07-08 DIAGNOSIS — F1994 Other psychoactive substance use, unspecified with psychoactive substance-induced mood disorder: Secondary | ICD-10-CM | POA: Diagnosis present

## 2013-07-08 DIAGNOSIS — F319 Bipolar disorder, unspecified: Secondary | ICD-10-CM | POA: Diagnosis present

## 2013-07-08 DIAGNOSIS — F4310 Post-traumatic stress disorder, unspecified: Secondary | ICD-10-CM | POA: Diagnosis present

## 2013-07-08 DIAGNOSIS — F1410 Cocaine abuse, uncomplicated: Secondary | ICD-10-CM

## 2013-07-08 DIAGNOSIS — F410 Panic disorder [episodic paroxysmal anxiety] without agoraphobia: Secondary | ICD-10-CM | POA: Diagnosis present

## 2013-07-08 DIAGNOSIS — F1010 Alcohol abuse, uncomplicated: Secondary | ICD-10-CM

## 2013-07-08 DIAGNOSIS — R45851 Suicidal ideations: Secondary | ICD-10-CM

## 2013-07-08 LAB — COMPREHENSIVE METABOLIC PANEL
ALBUMIN: 3.8 g/dL (ref 3.5–5.2)
ALK PHOS: 87 U/L (ref 39–117)
ALT: 28 U/L (ref 0–35)
AST: 27 U/L (ref 0–37)
BILIRUBIN TOTAL: 0.3 mg/dL (ref 0.3–1.2)
BUN: 17 mg/dL (ref 6–23)
CHLORIDE: 105 meq/L (ref 96–112)
CO2: 24 meq/L (ref 19–32)
CREATININE: 1.12 mg/dL — AB (ref 0.50–1.10)
Calcium: 9.8 mg/dL (ref 8.4–10.5)
GFR calc Af Amer: 63 mL/min — ABNORMAL LOW (ref >90)
GFR, EST NON AFRICAN AMERICAN: 54 mL/min — AB (ref >90)
Glucose, Bld: 112 mg/dL — ABNORMAL HIGH (ref 70–99)
POTASSIUM: 5 meq/L (ref 3.7–5.3)
SODIUM: 141 meq/L (ref 137–147)
Total Protein: 7.8 g/dL (ref 6.0–8.3)

## 2013-07-08 LAB — CBC WITH DIFFERENTIAL/PLATELET
BASOS ABS: 0 10*3/uL (ref 0.0–0.1)
BASOS PCT: 1 % (ref 0–1)
Eosinophils Absolute: 0.3 10*3/uL (ref 0.0–0.7)
Eosinophils Relative: 3 % (ref 0–5)
HEMATOCRIT: 38.3 % (ref 36.0–46.0)
Hemoglobin: 12.7 g/dL (ref 12.0–15.0)
LYMPHS PCT: 31 % (ref 12–46)
Lymphs Abs: 2.4 10*3/uL (ref 0.7–4.0)
MCH: 28.5 pg (ref 26.0–34.0)
MCHC: 33.2 g/dL (ref 30.0–36.0)
MCV: 85.9 fL (ref 78.0–100.0)
MONO ABS: 0.5 10*3/uL (ref 0.1–1.0)
Monocytes Relative: 6 % (ref 3–12)
NEUTROS ABS: 4.7 10*3/uL (ref 1.7–7.7)
NEUTROS PCT: 60 % (ref 43–77)
Platelets: 179 10*3/uL (ref 150–400)
RBC: 4.46 MIL/uL (ref 3.87–5.11)
RDW: 14 % (ref 11.5–15.5)
WBC: 7.9 10*3/uL (ref 4.0–10.5)

## 2013-07-08 LAB — URINALYSIS, ROUTINE W REFLEX MICROSCOPIC
BILIRUBIN URINE: NEGATIVE
GLUCOSE, UA: NEGATIVE mg/dL
HGB URINE DIPSTICK: NEGATIVE
Ketones, ur: NEGATIVE mg/dL
Leukocytes, UA: NEGATIVE
Nitrite: NEGATIVE
PROTEIN: NEGATIVE mg/dL
Specific Gravity, Urine: 1.02 (ref 1.005–1.030)
UROBILINOGEN UA: 0.2 mg/dL (ref 0.0–1.0)
pH: 5.5 (ref 5.0–8.0)

## 2013-07-08 LAB — RAPID URINE DRUG SCREEN, HOSP PERFORMED
AMPHETAMINES: NOT DETECTED
BARBITURATES: NOT DETECTED
Benzodiazepines: NOT DETECTED
Cocaine: POSITIVE — AB
OPIATES: NOT DETECTED
TETRAHYDROCANNABINOL: NOT DETECTED

## 2013-07-08 LAB — ETHANOL: Alcohol, Ethyl (B): <11 mg/dL (ref 0–11)

## 2013-07-08 MED ORDER — IBUPROFEN 800 MG PO TABS
800.0000 mg | ORAL_TABLET | Freq: Three times a day (TID) | ORAL | Status: DC
  Administered 2013-07-08 (×2): 800 mg via ORAL
  Filled 2013-07-08 (×2): qty 1

## 2013-07-08 MED ORDER — IBUPROFEN 800 MG PO TABS
800.0000 mg | ORAL_TABLET | Freq: Three times a day (TID) | ORAL | Status: DC
  Administered 2013-07-09 – 2013-07-12 (×11): 800 mg via ORAL
  Filled 2013-07-08 (×17): qty 1

## 2013-07-08 MED ORDER — SERTRALINE HCL 100 MG PO TABS
100.0000 mg | ORAL_TABLET | Freq: Every day | ORAL | Status: DC
  Administered 2013-07-09 – 2013-07-11 (×3): 100 mg via ORAL
  Filled 2013-07-08 (×3): qty 1
  Filled 2013-07-08: qty 14
  Filled 2013-07-08 (×4): qty 1

## 2013-07-08 MED ORDER — HYDROXYZINE HCL 25 MG PO TABS: 25.0000 mg | ORAL_TABLET | ORAL | Status: DC | PRN

## 2013-07-08 MED ORDER — ALBUTEROL SULFATE HFA 108 (90 BASE) MCG/ACT IN AERS
2.0000 | INHALATION_SPRAY | Freq: Four times a day (QID) | RESPIRATORY_TRACT | Status: DC | PRN
  Administered 2013-07-09 – 2013-07-12 (×6): 2 via RESPIRATORY_TRACT
  Filled 2013-07-08: qty 6.7

## 2013-07-08 MED ORDER — ACETAMINOPHEN 325 MG PO TABS
650.0000 mg | ORAL_TABLET | Freq: Four times a day (QID) | ORAL | Status: DC | PRN
  Administered 2013-07-10: 650 mg via ORAL
  Filled 2013-07-08: qty 2

## 2013-07-08 MED ORDER — TRAZODONE HCL 100 MG PO TABS: 100.0000 mg | ORAL_TABLET | Freq: Every day | ORAL | Status: DC

## 2013-07-08 MED ORDER — ARIPIPRAZOLE 5 MG PO TABS
5.0000 mg | ORAL_TABLET | Freq: Every day | ORAL | Status: DC
  Administered 2013-07-09 – 2013-07-12 (×4): 5 mg via ORAL
  Filled 2013-07-08 (×5): qty 1
  Filled 2013-07-08: qty 14
  Filled 2013-07-08: qty 1

## 2013-07-08 MED ORDER — SERTRALINE HCL 50 MG PO TABS
100.0000 mg | ORAL_TABLET | Freq: Every day | ORAL | Status: DC
  Administered 2013-07-08: 100 mg via ORAL
  Filled 2013-07-08: qty 2

## 2013-07-08 MED ORDER — ARIPIPRAZOLE 5 MG PO TABS: 5.0000 mg | ORAL_TABLET | Freq: Every day | ORAL | Status: DC

## 2013-07-08 MED ORDER — ALBUTEROL SULFATE HFA 108 (90 BASE) MCG/ACT IN AERS
2.0000 | INHALATION_SPRAY | Freq: Four times a day (QID) | RESPIRATORY_TRACT | Status: DC | PRN
  Administered 2013-07-08: 2 via RESPIRATORY_TRACT
  Filled 2013-07-08: qty 6.7

## 2013-07-08 MED ORDER — TRAZODONE HCL 100 MG PO TABS
100.0000 mg | ORAL_TABLET | Freq: Every day | ORAL | Status: DC
  Administered 2013-07-09 – 2013-07-11 (×3): 100 mg via ORAL
  Filled 2013-07-08 (×7): qty 1
  Filled 2013-07-08: qty 14
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
## 2013-07-08 NOTE — ED Provider Notes (Signed)
CSN: [id number]     Arrival date & time 07/08/13  1350 History   First MD Initiated Contact with Patient 07/08/13 1359     Chief Complaint  Patient presents with  . Medical Clearance      HPI  Patient presents here with a request for detox. She said she drinks daily. She smokes crack every day. She went to rehabilitation at [facility] last year.  She is alert and sober from about 1 month. States "wrong people, wrong place,wrong time".  She states she feels "down" but she denies being overtly depressed. Not suicidal homicidal.  History of breast cancer in remission status post mastectomy. No other chronic medical illnesses.  Past Medical History  Diagnosis Date  . Cancer 2002    left breast cancer tx in [place]   Past Surgical History  Procedure Laterality Date  . Mastectomy      and Implant  . Mandible fracture surgery    . Breast surgery  2002    left mastectomy with implant   Family History  Problem Relation Age of Onset  . Breast cancer Sister    History  Substance Use Topics  . Smoking status: Current Every Day Smoker -- 0.25 packs/day  . Smokeless tobacco: Never Used  . Alcohol Use: Yes   OB History   Grav Para Term Preterm Abortions TAB SAB Ect Mult Living                 Review of Systems  Constitutional: Negative for fever, chills, diaphoresis, appetite change and fatigue.  HENT: Negative for mouth sores, sore throat and trouble swallowing.   Eyes: Negative for visual disturbance.  Respiratory: Negative for cough, chest tightness, shortness of breath and wheezing.   Cardiovascular: Negative for chest pain.  Gastrointestinal: Negative for nausea, vomiting, abdominal pain, diarrhea and abdominal distention.  Endocrine: Negative for polydipsia, polyphagia and polyuria.  Genitourinary: Negative for dysuria, frequency and hematuria.  Musculoskeletal: Negative for gait problem.  Skin: Negative for color change, pallor and rash.  Neurological: Negative for  dizziness, syncope, light-headedness and headaches.  Hematological: Does not bruise/bleed easily.  Psychiatric/Behavioral: Negative for behavioral problems and confusion.      Allergies  Centrum  Home Medications   Current Outpatient Rx  Name  Route  Sig  Dispense  Refill  . albuterol (PROVENTIL HFA;VENTOLIN HFA) 108 (90 BASE) MCG/ACT inhaler   Inhalation   Inhale into the lungs every 6 (six) hours as needed for wheezing or shortness of breath.         . ARIPiprazole (ABILIFY) 5 MG tablet   Oral   Take 5 mg by mouth daily.         . hydrOXYzine (VISTARIL) 25 MG capsule   Oral   Take 25 mg by mouth every 4 (four) hours as needed for itching (every 4 to 6 hours as needed.).         Marland Kitchen ibuprofen (ADVIL,MOTRIN) 800 MG tablet   Oral   Take 800 mg by mouth 3 (three) times daily.         . sertraline (ZOLOFT) 100 MG tablet   Oral   Take 100 mg by mouth at bedtime.          . traZODone (DESYREL) 100 MG tablet   Oral   Take 100 mg by mouth at bedtime. For sleep          BP 127/78  Pulse 89  Temp(Src) 97.8 F (36.6 C) (Oral)  Resp  16  SpO2 100% Physical Exam  Constitutional: She is oriented to person, place, and time. She appears well-developed and well-nourished. No distress.  HENT:  Head: Normocephalic.  Eyes: Conjunctivae are normal. Pupils are equal, round, and reactive to light. No scleral icterus.  Neck: Normal range of motion. Neck supple. No thyromegaly present.  Cardiovascular: Normal rate and regular rhythm.  Exam reveals no gallop and no friction rub.   No murmur heard. Pulmonary/Chest: Effort normal and breath sounds normal. No respiratory distress. She has no wheezes. She has no rales.  Abdominal: Soft. Bowel sounds are normal. She exhibits no distension. There is no tenderness. There is no rebound.  Musculoskeletal: Normal range of motion.  Neurological: She is alert and oriented to person, place, and time.  Skin: Skin is warm and dry. No rash  noted.  Psychiatric: She has a normal mood and affect. Her behavior is normal.    ED Course  Procedures (including critical care time) Labs Review Labs Reviewed  COMPREHENSIVE METABOLIC PANEL - Abnormal; Notable for the following:    Glucose, Bld 112 (*)    Creatinine, Ser 1.12 (*)    GFR calc non Af Amer 54 (*)    GFR calc Af Amer 63 (*)    All other components within normal limits  URINE RAPID DRUG SCREEN (HOSP PERFORMED) - Abnormal; Notable for the following:    Cocaine POSITIVE (*)    All other components within normal limits  CBC WITH DIFFERENTIAL  URINALYSIS, ROUTINE W REFLEX MICROSCOPIC  ETHANOL   Imaging Review No results found.  EKG Interpretation   None       MDM   Final diagnoses:  Cocaine abuse  Alcohol abuse    Plan: At this time she is medically clear. She will undergo psychiatric evaluation for attempt for placement for rehabilitation at her request. She is voluntary.  20:37:  The patient assessed by act team.  Has been accepted as inpatient behavioral health hospital.    Tanna Furry, MD 07/08/13 2038

## 2013-07-08 NOTE — ED Notes (Signed)
Bed: [facility] Expected date:  Expected time:  Means of arrival:  Comments: housekeeping

## 2013-07-08 NOTE — ED Notes (Signed)
Per pt, states she drinks and smokes crack every chance she gets and she is tired of it-last drink was last night, 2 40oz beers- and smoked an unknown amount of crack

## 2013-07-08 NOTE — ED Notes (Signed)
Bed: [facility] Expected date:  Expected time:  Means of arrival:  Comments: Hold for triage 4 

## 2013-07-09 ENCOUNTER — Encounter (HOSPITAL_COMMUNITY): Payer: Self-pay | Admitting: Psychiatry

## 2013-07-09 DIAGNOSIS — F1020 Alcohol dependence, uncomplicated: Secondary | ICD-10-CM | POA: Diagnosis present

## 2013-07-09 DIAGNOSIS — F329 Major depressive disorder, single episode, unspecified: Secondary | ICD-10-CM | POA: Diagnosis present

## 2013-07-09 DIAGNOSIS — F4310 Post-traumatic stress disorder, unspecified: Secondary | ICD-10-CM

## 2013-07-09 MED ORDER — CHLORDIAZEPOXIDE HCL 25 MG PO CAPS
25.0000 mg | ORAL_CAPSULE | Freq: Four times a day (QID) | ORAL | Status: DC | PRN
  Filled 2013-07-09: qty 1

## 2013-07-09 MED ORDER — ONDANSETRON 4 MG PO TBDP: 4.0000 mg | ORAL_TABLET | Freq: Four times a day (QID) | ORAL | Status: DC | PRN

## 2013-07-09 MED ORDER — HYDROXYZINE HCL 25 MG PO TABS: 25.0000 mg | ORAL_TABLET | Freq: Four times a day (QID) | ORAL | Status: DC | PRN

## 2013-07-09 MED ORDER — LOPERAMIDE HCL 2 MG PO CAPS: 2.0000 mg | ORAL_CAPSULE | ORAL | Status: DC | PRN

## 2013-07-09 MED ORDER — CHLORDIAZEPOXIDE HCL 25 MG PO CAPS
25.0000 mg | ORAL_CAPSULE | Freq: Four times a day (QID) | ORAL | Status: AC
  Administered 2013-07-09 – 2013-07-10 (×6): 25 mg via ORAL
  Filled 2013-07-09 (×6): qty 1

## 2013-07-09 MED ORDER — CHLORDIAZEPOXIDE HCL 25 MG PO CAPS
25.0000 mg | ORAL_CAPSULE | Freq: Every day | ORAL | Status: DC
Start: 2013-07-13 — End: 2013-07-12

## 2013-07-09 MED ORDER — CHLORDIAZEPOXIDE HCL 25 MG PO CAPS
25.0000 mg | ORAL_CAPSULE | ORAL | Status: DC
  Administered 2013-07-12: 25 mg via ORAL
  Filled 2013-07-09: qty 1

## 2013-07-09 MED ORDER — CHLORDIAZEPOXIDE HCL 25 MG PO CAPS
25.0000 mg | ORAL_CAPSULE | Freq: Three times a day (TID) | ORAL | Status: AC
  Administered 2013-07-11 (×3): 25 mg via ORAL
  Filled 2013-07-09 (×3): qty 1

## 2013-07-09 MED ORDER — NICOTINE 21 MG/24HR TD PT24
21.0000 mg | MEDICATED_PATCH | Freq: Every day | TRANSDERMAL | Status: DC
  Administered 2013-07-09 – 2013-07-12 (×4): 21 mg via TRANSDERMAL
  Filled 2013-07-09 (×7): qty 1

## 2013-07-09 NOTE — Tx Team (Signed)
Initial Interdisciplinary Treatment Plan  PATIENT STRENGTHS: (choose at least two) Ability for insight Communication skills General fund of knowledge Supportive family/friends  PATIENT STRESSORS: Substance abuse   PROBLEM LIST: Problem List/Patient Goals Date to be addressed Date deferred Reason deferred Estimated date of resolution  Substance abuse(detox) 07-08-13                                                      DISCHARGE CRITERIA:  Improved stabilization in mood, thinking, and/or behavior Verbal commitment to aftercare and medication compliance  PRELIMINARY DISCHARGE PLAN: Attend aftercare/continuing care group Attend 12-step recovery group  PATIENT/FAMIILY INVOLVEMENT: This treatment plan has been presented to and reviewed with the patient, Marissa Brooks, and/or family membe.  The patient and family have been given the opportunity to ask questions and make suggestions.  Zoe Lan 07/09/2013, 12:02 AM
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
## 2013-07-09 NOTE — Progress Notes (Signed)
Patient ID: Marissa Brooks, female   DOB: [birth year], 58 y.o.   MRN: [id number] D: patient remains isolative with minimal interaction with staff and others.  She reports minimal withdrawal symptoms and has not needed any prns.  She is compliant with her medications.  Patient appears slow to respond to questions.  She denies any SI/HI/AVH.  She did complain of the heat in her room due to roommate keeping the thermostat high.  A: continue to monitor medication management and MD orders.  Safety checks completed every 15 minutes per protocol.  R: patient is cooperative; her behavior is appropriate.

## 2013-07-09 NOTE — [facility] Group Notes (Signed)
[facility] LCSW Group Therapy  07/09/2013  1:15 PM   Type of Therapy:  Group Therapy  Participation Level:  Active  Participation Quality:  Appropriate, Attentive, Sharing and Supportive  Affect:  Depressed and Flat  Cognitive:  Alert and Oriented  Insight:  Developing/Improving and Engaged  Engagement in Therapy:  Developing/Improving and Engaged  Modes of Intervention:  Clarification, Confrontation, Discussion, Education, Exploration, Limit-setting, Orientation, Problem-solving, Rapport Building, Art therapist, Socialization and Support  Summary of Progress/Problems: CSW discussed the [facility] as well as other resources in the community.  Patient expressed interest in their programs and services and received information on their agency.  Discussed pt's discharge plan and obstacles/barriers that may present in executing their discharge plans.  Pt didn't have any further questions or concerns at this time.   Regan Lemming, LCSW 07/09/2013 1:57 PM

## 2013-07-09 NOTE — [facility] Group Notes (Signed)
Adult Psychoeducational Group Note  Date:  07/09/2013 Time:  11:41 PM  Group Topic/Focus:  Wrap-Up Group:   The focus of this group is to help patients review their daily goal of treatment and discuss progress on daily workbooks.  Participation Level:  Did Not Attend  Participation Quality:  None  Affect:  None  Cognitive:  None  Insight: None  Engagement in Group:  None  Modes of Intervention:  Discussion  Additional Comments:  Marissa Brooks did not attend group.  Marissa Brooks A 07/09/2013, 11:41 PM

## 2013-07-09 NOTE — Progress Notes (Signed)
[facility] Group Notes:  (Nursing/MHT/Case Management/Adjunct)  Date:  07/09/2013  Time:  3:35 PM  Type of Therapy:  Psychoeducational Skills  Participation Level:  Active  Participation Quality:  Appropriate  Affect:  Appropriate  Cognitive:  Alert and Appropriate  Insight:  Appropriate  Engagement in Group:  Engaged  Modes of Intervention:  Discussion  Summary of Progress/Problems: In group today we discussed coping skills and played coping skills pictionary. Pt said that helping other people- specifically taking care of children who need help- is her most effective coping skill.  Harrie Foreman A 07/09/2013, 3:35 PM

## 2013-07-09 NOTE — Progress Notes (Signed)
Patient ID: Marissa Brooks, female   DOB: [birth year], 57 y.o.   MRN: [id number] PER STATE REGULATIONS 482.30  THIS CHART WAS REVIEWED FOR MEDICAL NECESSITY WITH RESPECT TO THE PATIENT'S ADMISSION/ DURATION OF STAY.  NEXT REVIEW DATE: 07/12/2013  Chauncy Lean, RN, BSN CASE MANAGER
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
## 2013-07-09 NOTE — Progress Notes (Signed)
Patient has been sleeping all evening. Her mood and affect flat and depressed. She reported that her medications working, denied SI/HI and denied hallucinations. She received her HS medication without difficulty. Q 15 minute check continues as ordered to maintain safety.
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
## 2013-07-09 NOTE — Progress Notes (Signed)
DAR- Update note D: Pt observed sleeping in bed with eyes closed. RR even and unlabored. No distress noted  .  A: Q 15 minute checks were done for safety.  R: safety maintained on unit.  
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
## 2013-07-09 NOTE — BHH Suicide Risk Assessment (Signed)
[facility] INPATIENT: Family/Significant Other Suicide Prevention Education   Suicide Prevention Education:  Education Completed; No one has been identified by the patient as the family member/significant other with whom the patient will be residing, and identified as the person(s) who will aid the patient in the event of a mental health crisis (suicidal ideations/suicide attempt).   Pt did not c/o SI at admission, nor have they endorsed SI during their stay here. SPE not required. SPI pamphlet provided to pt and he was encouraged to share information with his support network, ask questions, and talk about any concerns.   The suicide prevention education provided includes the following:  Suicide risk factors  Suicide prevention and interventions  National Suicide Hotline telephone number  [facility] assessment telephone number  [facility] Emergency Assistance [place] and/or Residential Mobile Crisis Unit telephone number  Regan Lemming, [place] 07/09/2013  9:46 AM

## 2013-07-10 DIAGNOSIS — F10988 Alcohol use, unspecified with other alcohol-induced disorder: Secondary | ICD-10-CM

## 2013-07-10 NOTE — BHH Group Notes (Signed)
[facility] LCSW Aftercare Discharge Planning Group Note   07/10/2013  8:45 AM  Participation Quality:  Did Not Attend - pt sleeping in her room  Regan Lemming, LCSW 07/10/2013 9:43 AM

## 2013-07-10 NOTE — Progress Notes (Signed)
[facility] MD Progress Note  07/10/2013 4:28 PM Marissa Brooks  MRN:  [id number] Subjective:  Marissa Brooks endorses that she is trying to get her life back together. She is wanting to go to [facility] after she gets out of here. She is wanting to pursue long term abstinence. She states that there are a lot of things she needs to deal with, a lot of losses. Her right breast is hurting around the area she had the surgery. Has had the pain before and she takes analgesics. She stays current with her mammograms.  Diagnosis:   DSM5: Schizophrenia Disorders:  none Obsessive-Compulsive Disorders:  None Trauma-Stressor Disorders:  Posttraumatic Stress Disorder (309.81) Substance/Addictive Disorders:  Alcohol Related Disorder - Severe (303.90) Depressive Disorders:  Major Depressive Disorder - Severe (296.23) Total Time spent with patient: 30 minutes  Axis I: Generalized Anxiety Disorder and Substance Induced Mood Disorder  ADL's:  Intact  Sleep: Fair  Appetite:  Fair  Suicidal Ideation:  Plan:  denies Intent:  denies Means:  denies Homicidal Ideation:  Plan:  denies Intent:  denies Means:  denies AEB (as evidenced by):  Psychiatric Specialty Exam: Physical Exam  Review of Systems  Constitutional: Positive for malaise/fatigue.  HENT: Negative.   Eyes: Negative.   Respiratory: Negative.   Cardiovascular:       Pain in her breast  Gastrointestinal: Positive for heartburn.  Genitourinary: Negative.   Musculoskeletal: Positive for back pain.  Skin: Negative.   Neurological: Positive for weakness.  Endo/Heme/Allergies: Negative.   Psychiatric/Behavioral: Positive for depression and substance abuse. The patient is nervous/anxious.     Blood pressure 156/71, pulse 68, temperature 98.2 F (36.8 C), temperature source Oral, resp. rate 21, height 5' 1.5" (1.562 m), weight 92.08 kg (203 lb).Body mass index is 37.74 kg/(m^2).  General Appearance: Fairly Groomed  Engineer, water::  Fair  Speech:  Clear and  Coherent, Slow and not spontaneous  Volume:  Decreased  Mood:  Anxious, Depressed and worried  Affect:  Restricted  Thought Process:  Coherent and Goal Directed  Orientation:  Full (Time, Place, and Person)  Thought Content:  symptoms, worries, concerns  Suicidal Thoughts:  No  Homicidal Thoughts:  No  Memory:  Immediate;   Fair Recent;   Fair Remote;   Fair  Judgement:  Fair  Insight:  Shallow  Psychomotor Activity:  Restlessness  Concentration:  Fair  Recall:  [employer] of Knowledge:NA  Language: Fair  Akathisia:  No  Handed:    AIMS (if indicated):     Assets:  Desire for Improvement  Sleep:  Number of Hours: 6.75   Musculoskeletal: Strength & Muscle Tone: within normal limits Gait & Station: normal Patient leans: N/A  Current Medications: Current Facility-Administered Medications  Medication Dose Route Frequency Provider Last Rate Last Dose  . acetaminophen (TYLENOL) tablet 650 mg  650 mg Oral Q6H PRN Laverle Hobby, PA-C   650 mg at 07/10/13 1125  . albuterol (PROVENTIL HFA;VENTOLIN HFA) 108 (90 BASE) MCG/ACT inhaler 2 puff  2 puff Inhalation Q6H PRN Laverle Hobby, PA-C   2 puff at 07/10/13 1202  . ARIPiprazole (ABILIFY) tablet 5 mg  5 mg Oral Daily Laverle Hobby, PA-C   5 mg at 07/10/13 1093  . chlordiazePOXIDE (LIBRIUM) capsule 25 mg  25 mg Oral Q6H PRN Nicholaus Bloom, MD      . chlordiazePOXIDE (LIBRIUM) capsule 25 mg  25 mg Oral QID Nicholaus Bloom, MD   25 mg at 07/10/13 1202  Followed by  . [START ON 07/11/2013] chlordiazePOXIDE (LIBRIUM) capsule 25 mg  25 mg Oral TID Nicholaus Bloom, MD       Followed by  . [START ON 07/12/2013] chlordiazePOXIDE (LIBRIUM) capsule 25 mg  25 mg Oral BH-qamhs Nicholaus Bloom, MD       Followed by  . [START ON 07/13/2013] chlordiazePOXIDE (LIBRIUM) capsule 25 mg  25 mg Oral Daily Nicholaus Bloom, MD      . hydrOXYzine (ATARAX/VISTARIL) tablet 25 mg  25 mg Oral Q4H PRN Laverle Hobby, PA-C      . ibuprofen (ADVIL,MOTRIN) tablet 800 mg   800 mg Oral TID Laverle Hobby, PA-C   800 mg at 07/10/13 1202  . loperamide (IMODIUM) capsule 2-4 mg  2-4 mg Oral PRN Nicholaus Bloom, MD      . nicotine (NICODERM CQ - dosed in mg/24 hours) patch 21 mg  21 mg Transdermal Daily Encarnacion Slates, NP   21 mg at 07/10/13 0752  . ondansetron (ZOFRAN-ODT) disintegrating tablet 4 mg  4 mg Oral Q6H PRN Nicholaus Bloom, MD      . sertraline (ZOLOFT) tablet 100 mg  100 mg Oral QHS Laverle Hobby, PA-C   100 mg at 07/09/13 2119  . traZODone (DESYREL) tablet 100 mg  100 mg Oral QHS Laverle Hobby, PA-C   100 mg at 07/09/13 2119    Lab Results: No results found for this or any previous visit (from the past 48 hour(s)).  Physical Findings: AIMS: Facial and Oral Movements Muscles of Facial Expression: None, normal Lips and Perioral Area: None, normal,Extremity Movements Upper (arms, wrists, hands, fingers): None, normal Lower (legs, knees, ankles, toes): None, normal, Trunk Movements Neck, shoulders, hips: None, normal, Overall Severity Severity of abnormal movements (highest score from questions above): None, normal Incapacitation due to abnormal movements: None, normal Patient's awareness of abnormal movements (rate only patient's report): No Awareness, Dental Status Current problems with teeth and/or dentures?: No Does patient usually wear dentures?: No  CIWA:  CIWA-Ar Total: 2 COWS:     Treatment Plan Summary: Daily contact with patient to assess and evaluate symptoms and progress in treatment Medication management  Plan: Supporitve approach/coping skills/relapse prevention           CBT; mindfulness           Continue detox           Continue to address the co morbidities             Medical Decision Making Problem Points:  Review of psycho-social stressors (1) Data Points:  Review of medication regiment & side effects (2) Review of new medications or change in dosage (2)  I certify that inpatient services furnished can reasonably be  expected to improve the patient's condition.   Akili Cuda A 07/10/2013, 4:28 PM

## 2013-07-10 NOTE — Tx Team (Signed)
Interdisciplinary Treatment Plan Update (Adult)  Date: 07/10/2013  Time Reviewed:  9:45 AM  Progress in Treatment: Attending groups: Yes Participating in groups:  Yes Taking medication as prescribed:  Yes Tolerating medication:  Yes Family/Significant othe contact made: No, attempts made Patient understands diagnosis:  Yes Discussing patient identified problems/goals with staff:  Yes Medical problems stabilized or resolved:  Yes Denies suicidal/homicidal ideation: Yes Issues/concerns per patient self-inventory:  Yes Other:  New problem(s) identified: N/A  Discharge Plan or Barriers: CSW made referral to [facility] for further inpatient treatment.  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    Reason for Continuation of Hospitalization: Anxiety Depression Medication Stabilization Detox  Comments: N/A  Estimated length of stay: 3-5 days  For review of initial/current patient goals, please see plan of care.  Attendees: Patient:     Family:     Physician:  Dr. Sabra Heck 07/10/2013 9:56 AM   Nursing:   Para March, RN 07/10/2013 9:56 AM   Clinical Social Worker:  Regan Lemming, LCSW 07/10/2013 9:56 AM   Other: Lars Pinks, RN case manager 07/10/2013 9:56 AM   Other:  Maxie Better, Brimhall Nizhoni 07/10/2013 9:56 AM   Other:  Eduard Roux, RN 07/10/2013 9:57 AM   Other:  Monia Sabal, RN 07/10/2013 9:57 AM   Other: Satira Sark, RN 07/10/2013 9:57 AM   Other:    Other:    Other:    Other:    Other:     Scribe for Treatment Team:   Ane Payment, 07/10/2013 , 9:56 AM

## 2013-07-10 NOTE — BHH Group Notes (Signed)
[facility] Group Notes:  (Nursing/MHT/Case Management/Adjunct)  Date:07/09/2013 Time:  0900 am  Type of Therapy:  Psychoeducational Skills  Participation Level:  Did Not Attend    Zipporah Plants 07/10/2013, 11:12 AM

## 2013-07-10 NOTE — Progress Notes (Signed)
Recreation Therapy Notes  Date: 02.18.2015 Time: 2:45pm Location: [address] Dayroom    Group Topic: Boundaries  Goal Area(s) Addresses:  Patient will identify benefit of establishing healthy boundaries.  Patient will identify what is preventing establishing healthy boundaries.   Behavioral Response: Did not attend.   Laureen Ochs Olin Gurski, LRT/CTRS   Uzoma Vivona L 07/10/2013 7:05 PM

## 2013-07-10 NOTE — Progress Notes (Signed)
Pt did not attend NA group meeting

## 2013-07-10 NOTE — BHH Group Notes (Signed)
[place] LCSW Group Therapy  07/10/2013  1:15 PM   Type of Therapy:  Group Therapy  Participation Level:  Active  Participation Quality:  Attentive, Sharing and Supportive  Affect:  Depressed and Flat  Cognitive:  Alert and Oriented  Insight:  Developing/Improving and Engaged  Engagement in Therapy:  Developing/Improving and Engaged  Modes of Intervention:  Clarification, Confrontation, Discussion, Education, Exploration, Limit-setting, Orientation, Problem-solving, Rapport Building, Art therapist, Socialization and Support  Summary of Progress/Problems: The topic for group today was emotional regulation.  This group focused on both positive and negative emotion identification and allowed group members to process ways to identify feelings, regulate negative emotions, and find healthy ways to manage internal/external emotions. Group members were asked to reflect on a time when their reaction to an emotion led to a negative outcome and explored how alternative responses using emotion regulation would have benefited them. Group members were also asked to discuss a time when emotion regulation was utilized when a negative emotion was experienced. Pt shared that she deals with anxiety which lead to panic attacks.  Pt states that she becomes fearful and uncomfortable.  Pt states that she utilizes breathing techniques or takes a walk to cope with her anxiety.  Pt was supportive to peers, actively participated and was engaged in group discussion.    Regan Lemming, LCSW 07/10/2013 1:55 PM

## 2013-07-10 NOTE — Progress Notes (Signed)
She rated all her anxiety depression and hopelessness a 6 on her self-inventory.  She denies any S/H ideation and denies A/V/H. She hopes to go to [facility].

## 2013-07-11 MED ORDER — ALUM & MAG HYDROXIDE-SIMETH 200-200-20 MG/5ML PO SUSP
30.0000 mL | Freq: Four times a day (QID) | ORAL | Status: DC | PRN
  Administered 2013-07-11: 30 mL via ORAL

## 2013-07-11 NOTE — Progress Notes (Signed)
Psychoeducational Group Note  Date:  07/11/2013 Time:  2100  Group Topic/Focus:  wrap up group  Participation Level: Did Not Attend  Participation Quality:  Not Applicable  Affect:  Not Applicable  Cognitive:  Not Applicable  Insight:  Not Applicable  Engagement in Group: Not Applicable  Additional Comments:  Pt remained in bed during group time.   Jacques Navy 07/11/2013, 10:46 PM

## 2013-07-11 NOTE — Progress Notes (Signed)
[facility] MD Progress Note  07/11/2013 5:53 PM AMBREA Brooks  MRN:  [id number] Subjective:  Marissa Brooks continues to be detox. Very reserved, guarded, avoids interactions. States she wants to go to rehab. Will like to go to [facility]. She is wanting to pursue long term sobriety as states she is tired of feeling this way Diagnosis:   DSM5: Schizophrenia Disorders:  none Obsessive-Compulsive Disorders:  none Trauma-Stressor Disorders:  Posttraumatic Stress Disorder (309.81) Substance/Addictive Disorders:  Alcohol Related Disorder - Severe (303.90) Depressive Disorders:  Major Depressive Disorder - Moderate (296.22) Total Time spent with patient: 20 minutes  Axis I: Substance Induced Mood Disorder  ADL's:  Intact  Sleep: Fair  Appetite:  Fair  Suicidal Ideation:  Plan:  denies Intent:  denies Means:  denies Homicidal Ideation:  Plan:  denies Intent:  denies Means:  denies AEB (as evidenced by):  Psychiatric Specialty Exam: Physical Exam  Review of Systems  Constitutional: Positive for malaise/fatigue.  HENT: Positive for hearing loss.   Eyes: Negative.   Respiratory: Negative.   Cardiovascular: Negative.   Gastrointestinal: Negative.   Genitourinary: Negative.   Musculoskeletal: Positive for myalgias.  Skin: Negative.   Neurological: Positive for weakness.  Endo/Heme/Allergies: Negative.   Psychiatric/Behavioral: Positive for depression and substance abuse. The patient is nervous/anxious.     Blood pressure 159/83, pulse 64, temperature 98 F (36.7 C), temperature source Oral, resp. rate 18, height 5' 1.5" (1.562 m), weight 92.08 kg (203 lb).Body mass index is 37.74 kg/(m^2).  General Appearance: Disheveled  Eye Contact::  Minimal  Speech:  Clear and Coherent, Slow and not spontaneous  Volume:  Decreased  Mood:  Depressed  Affect:  Restricted  Thought Process:  Coherent and Goal Directed  Orientation:  Full (Time, Place, and Person)  Thought Content:  symtpoms, worries, concerns   Suicidal Thoughts:  No  Homicidal Thoughts:  No  Memory:  Immediate;   Fair Recent;   Fair Remote;   Fair  Judgement:  Fair  Insight:  Shallow  Psychomotor Activity:  Psychomotor Retardation  Concentration:  Fair  Recall:  [employer] of Knowledge:NA  Language: Fair  Akathisia:  No  Handed:    AIMS (if indicated):     Assets:  Desire for Improvement  Sleep:  Number of Hours: 6.75   Musculoskeletal: Strength & Muscle Tone: within normal limits Gait & Station: normal Patient leans: N/A  Current Medications: Current Facility-Administered Medications  Medication Dose Route Frequency Provider Last Rate Last Dose  . acetaminophen (TYLENOL) tablet 650 mg  650 mg Oral Q6H PRN Laverle Hobby, PA-C   650 mg at 07/10/13 1125  . albuterol (PROVENTIL HFA;VENTOLIN HFA) 108 (90 BASE) MCG/ACT inhaler 2 puff  2 puff Inhalation Q6H PRN Laverle Hobby, PA-C   2 puff at 07/11/13 1651  . alum & mag hydroxide-simeth (MAALOX/MYLANTA) 200-200-20 MG/5ML suspension 30 mL  30 mL Oral Q6H PRN Nicholaus Bloom, MD   30 mL at 07/11/13 1656  . ARIPiprazole (ABILIFY) tablet 5 mg  5 mg Oral Daily Laverle Hobby, PA-C   5 mg at 07/11/13 0741  . chlordiazePOXIDE (LIBRIUM) capsule 25 mg  25 mg Oral Q6H PRN Nicholaus Bloom, MD      . Derrill Memo ON 07/12/2013] chlordiazePOXIDE (LIBRIUM) capsule 25 mg  25 mg Oral BH-qamhs Nicholaus Bloom, MD       Followed by  . [START ON 07/13/2013] chlordiazePOXIDE (LIBRIUM) capsule 25 mg  25 mg Oral Daily Nicholaus Bloom, MD      .  hydrOXYzine (ATARAX/VISTARIL) tablet 25 mg  25 mg Oral Q4H PRN Laverle Hobby, PA-C      . ibuprofen (ADVIL,MOTRIN) tablet 800 mg  800 mg Oral TID Laverle Hobby, PA-C   800 mg at 07/11/13 1652  . loperamide (IMODIUM) capsule 2-4 mg  2-4 mg Oral PRN Nicholaus Bloom, MD      . nicotine (NICODERM CQ - dosed in mg/24 hours) patch 21 mg  21 mg Transdermal Daily Encarnacion Slates, NP   21 mg at 07/11/13 0742  . ondansetron (ZOFRAN-ODT) disintegrating tablet 4 mg  4 mg Oral  Q6H PRN Nicholaus Bloom, MD      . sertraline (ZOLOFT) tablet 100 mg  100 mg Oral QHS Laverle Hobby, PA-C   100 mg at 07/10/13 2215  . traZODone (DESYREL) tablet 100 mg  100 mg Oral QHS Laverle Hobby, PA-C   100 mg at 07/10/13 2215    Lab Results: No results found for this or any previous visit (from the past 48 hour(s)).  Physical Findings: AIMS: Facial and Oral Movements Muscles of Facial Expression: None, normal Lips and Perioral Area: None, normal,Extremity Movements Upper (arms, wrists, hands, fingers): None, normal Lower (legs, knees, ankles, toes): None, normal, Trunk Movements Neck, shoulders, hips: None, normal, Overall Severity Severity of abnormal movements (highest score from questions above): None, normal Incapacitation due to abnormal movements: None, normal Patient's awareness of abnormal movements (rate only patient's report): No Awareness, Dental Status Current problems with teeth and/or dentures?: No Does patient usually wear dentures?: No  CIWA:  CIWA-Ar Total: 1 COWS:     Treatment Plan Summary: Daily contact with patient to assess and evaluate symptoms and progress in treatment Medication management  Plan: Supportive approach/coping skills/relapse prevention           Complete detox/reassess co morbidities           Facilitate admission to rehab  Medical Decision Making Problem Points:  Review of psycho-social stressors (1) Data Points:  Review of medication regiment & side effects (2)  I certify that inpatient services furnished can reasonably be expected to improve the patient's condition.   Marquest Gunkel A 07/11/2013, 5:53 PM

## 2013-07-11 NOTE — BHH Group Notes (Signed)
[place] LCSW Group Therapy  07/11/2013  1:15 PM   Type of Therapy:  Group Therapy  Participation Level:  Active  Participation Quality:  Attentive, Sharing and Supportive  Affect:  Depressed and Flat  Cognitive:  Alert and Oriented  Insight:  Developing/Improving and Engaged  Engagement in Therapy:  Developing/Improving and Engaged  Modes of Intervention:  Clarification, Confrontation, Discussion, Education, Exploration, Limit-setting, Orientation, Problem-solving, Rapport Building, Art therapist, Socialization and Support  Summary of Progress/Problems: The topic for group was balance in life.  Today's group focused on defining balance in one's own words, identifying things that can knock one off balance, and exploring healthy ways to maintain balance in life. Group members were asked to provide an example of a time when they felt off balance, describe how they handled that situation,and process healthier ways to regain balance in the future. Group members were asked to share the most important tool for maintaining balance that they learned while at [facility] and how they plan to apply this method after discharge.  Pt shared that her life has been off balance from the alcohol and drug use, not taking her meds and not caring for herself.  Pt states that she had balance in her life when she was staying active with going to meetings, school and [employer].  Pt was supportive to a peer by writing her a letter relating to what the peer shared in group yesterday, and sharing it in group today.  Pt actively participated and was engaged in group discussion.    Regan Lemming, LCSW 07/11/2013 2:15 PM

## 2013-07-11 NOTE — Progress Notes (Signed)
Recreation Therapy Notes  Date: 02.19.2015 Time: 2:45pm Location: [address] Dayroom   Group Topic: Communication, Team Building, Problem Solving  Goal Area(s) Addresses:  Patient will effectively work with peer towards shared goal.  Patient will identify skill used to make activity successful.  Patient will identify how skills used during activity can be used to reach post d/c goals.   Behavioral Response: Did not attend.   Marissa Brooks L Jacquelin Krajewski, LRT/CTRS  Brittinie Wherley L 07/11/2013 8:58 PM 

## 2013-07-11 NOTE — Progress Notes (Signed)
Patient ID: Marissa Brooks, female   DOB: 07/28/1955, 57 y.o.   MRN: [id number] D: Patient isolative in her room most of the evening. Pt mood/affect appeared depressed and flat. Pt denies any withdrawal symptoms. Pt denies suicidal /homicidal ideation intent and plan. Pt denies auditory and visual hallucination. Pt denies any need or concern. Cooperative with assessment. No acute distressed noted at this time.   A: Met with pt 1:1. Medications administered as prescribed. Writer encouraged pt to discuss feelings. Pt encouraged to come to staff with any questions or concerns.   R: Patient is safe on the unit. She is complaint with medications and denies any adverse reaction. Continue current POC.     

## 2013-07-11 NOTE — Progress Notes (Signed)
Pt rated both her depression and hopelessness a 5 and anxiety 2 on her self-inventory.he denies S/H ideations and denies A/V/H.  She has been up for most groups she has laid down to get her some rest between groups.

## 2013-07-11 NOTE — Progress Notes (Signed)
The focus of this group is to educate the patient on the purpose and policies of crisis stabilization and provide a format to answer questions about their admission.  The group details unit policies and expectations of patients while admitted.  Pt did not attend the 0900 nursing group. 

## 2013-07-12 MED ORDER — SERTRALINE HCL 100 MG PO TABS: 100.0000 mg | ORAL_TABLET | Freq: Every day | ORAL | Status: AC

## 2013-07-12 MED ORDER — ALBUTEROL SULFATE HFA 108 (90 BASE) MCG/ACT IN AERS: 1.0000 | INHALATION_SPRAY | Freq: Four times a day (QID) | RESPIRATORY_TRACT | Status: DC | PRN

## 2013-07-12 MED ORDER — TRAZODONE HCL 100 MG PO TABS: 100.0000 mg | ORAL_TABLET | Freq: Every day | ORAL | Status: AC

## 2013-07-12 MED ORDER — ARIPIPRAZOLE 5 MG PO TABS: 5.0000 mg | ORAL_TABLET | Freq: Every day | ORAL | Status: AC

## 2013-07-12 NOTE — Progress Notes (Signed)
Adult Psychoeducational Group Note  Date:  07/12/2013 Time:  12:45 PM  Group Topic/Focus:  Early Warning Signs:   The focus of this group is to help patients identify signs or symptoms they exhibit before slipping into an unhealthy state or crisis.  Participation Level:  Did not attend.  Additional Comments:  Pt was in bed asleep.  Clint Bolder 07/12/2013, 12:45 PM

## 2013-07-12 NOTE — Progress Notes (Signed)
Pt d/c from the hospital to [facility]. All items on admission sheet returned including medications. D/C instructions given, prescriptions given and samples given. Pt reports that her friend Cathren Laine visited last night and brought a pair of tennis shoes for pt. Tennis shoes not in locker and no record on paper copy. Pt checking with her friend to see if he took them home as they had shoe strings. Pt denies si and hi.

## 2013-07-12 NOTE — Progress Notes (Signed)
[facility] Adult Case Management Discharge Plan :  Will you be returning to the same living situation after discharge: Yes,  pt can return home after treatment At discharge, do you have transportation home?:Yes,  [facility] will pick pt up today Do you have the ability to pay for your medications:Yes,  provided pt with samples and prescriptions. Pt verbalizes ability to afford meds  Release of information consent forms completed and in the chart;  Patient's signature needed at discharge.  Patient to Follow up at: Follow-up Information   Follow up with [facility] On 07/12/2013. ([facility] will pick you up at 2:30 pm today to transport to [facility] for 14 day inpatient treatment. )    Contact information:   [address] [place], [place] [address] Phone: [phone] Fax: [phone]      Follow up with [facility]. (Resume [facility] services, for medication management, therapy and support.  )    Contact information:   [address] [place], [place] [address] Phone: [phone] Fax: [phone]      Patient denies SI/HI:   Yes,  denies SI/HI    Safety Planning and Suicide Prevention discussed:  Yes,  discussed with pt.  N/A to contact family/friend due to no SI on admission.  See suicide prevention education note.   Ane Payment 07/12/2013, 12:20 PM

## 2013-07-12 NOTE — Discharge Summary (Signed)
Physician Discharge Summary Note  Patient:  Marissa Brooks Brooks is an 58 y.o., female MRN:  [id number] DOB:  Sep 28, 1955 Patient phone:  [phone] (home)  Patient address:   [address] [address],  Total Time spent with patient: Greater than 30 minutes  Date of Admission:  07/08/2013 Date of Discharge: 07/12/13  Reason for Admission: Alcohol detox  Discharge Diagnoses: Active Problems:   PTSD   Alcohol abuse with alcohol-induced mood disorder   Alcohol dependence   MDD (major depressive disorder)   Psychiatric Specialty Exam: Physical Exam  Constitutional: She is oriented to person, place, and time. She appears well-developed.  HENT:  Head: Normocephalic.  Eyes: Pupils are equal, round, and reactive to light.  Neck: Normal range of motion.  Cardiovascular: Normal rate.   GI: Soft.  Genitourinary:  Denies any issues in this area  Musculoskeletal: Normal range of motion.  Neurological: She is alert and oriented to person, place, and time.  Skin: Skin is warm and dry.  Psychiatric: Her speech is normal and behavior is normal. Judgment and thought content normal. Her mood appears not anxious. Her affect is not angry, not blunt, not labile and not inappropriate. Cognition and memory are normal. She does not exhibit a depressed mood.    Review of Systems  Constitutional: Negative.   HENT: Negative.   Eyes: Negative.   Respiratory: Negative.   Cardiovascular: Negative.   Gastrointestinal: Negative.   Genitourinary: Negative.   Musculoskeletal: Negative.   Skin: Negative.   Neurological: Negative.   Endo/Heme/Allergies: Negative.   Psychiatric/Behavioral: Positive for depression (Stable) and substance abuse (Alcoholism, chronic, Cocaine dependence). Negative for suicidal ideas, hallucinations and memory loss. The patient has insomnia (Stable). The patient is not nervous/anxious.     Blood pressure 157/89, pulse 64, temperature 98 F (36.7 C), temperature  source Oral, resp. rate 18, height 5' 1.5" (1.562 m), weight 92.08 kg (203 lb).Body mass index is 37.74 kg/(m^2).  General Appearance: Fairly Groomed  Engineer, water::  Good  Speech:  Clear and Coherent  Volume:  Normal  Mood:  Stable  Affect:  Appropriate and Congruent  Thought Process:  Coherent  Orientation:  Full (Time, Place, and Person)  Thought Content:  Denies any psychotic symptoms  Suicidal Thoughts:  No  Homicidal Thoughts:  No  Memory:  Immediate;   Good Recent;   Good Remote;   Fair  Judgement:  Fair  Insight:  Fair  Psychomotor Activity:  Normal  Concentration:  Good  Recall:  [employer] of Knowledge:Fair  Language: Good  Akathisia:  No  Handed:  Right  AIMS (if indicated):     Assets:  Desire for Improvement  Sleep:  Number of Hours: 5.75    Past Psychiatric History: Diagnosis: Alcohol Related Disorder - Severe (303.90), Major depressive disorder  Hospitalizations: [facility]  [facility]  Substance Abuse Care: [facility]  Self-Mutilation: Denies  Suicidal Attempts: Denies  Violent Behaviors: Denies   Musculoskeletal: Strength & Muscle Tone: within normal limits Gait & Station: normal Patient leans: N/A  DSM5: Schizophrenia Disorders:  NA Obsessive-Compulsive Disorders:  NA Trauma-Stressor Disorders:  NA Substance/Addictive Disorders:  Alcohol Related Disorder - Severe (303.90) Depressive Disorders:  MDD (Major depressive disorder)  Axis Diagnosis:   AXIS I:  Alcohol Related Disorder - Severe (303.90), Major depressive disorder AXIS II:  Deferred AXIS III:   Past Medical History  Diagnosis Date  . Cancer 2002    left breast cancer tx in [place]   AXIS IV:  Alcoholism, chronic AXIS V:  63  Level of Care:  RTC  Hospital Course:  58 Y/O female who came to "try to stop drinking and drugging." States she drinks 2 to 3 40 ounces every other other. Uses Crack cocaine every chance she gets. Using since she was 22 some. Smokes a joint  evey now and then. Used to smoke more marijuana but has decreased the amount she smokes.  Marissa Brooks Brooks received [facility] detox treatment. She has history of bipolar disorder with mood instability and suffers substance induced mood disorder. She was ordered and received Abilify 5 mg Q bedtime for mood control, Sertraline 100 mg daily for depression and Trazodone 100 mg Q bedtime for sleep. She was enrolled and participated in the AA/NA meetings being offered and held on this unit including group sessions. Marissa Brooks was resumed on all her pertinent home medications for her other existing medical issues. She tolerated her treatments without problems.  Marissa Brooks Brooks has completed detox and mood stabilized. She is being discharged to continue substance abuse treatment at [facility] treatment center. She was provided with 4 days worth supply samples of her [facility] discharge medications. Upon discharge, Marissa Brooks Brooks adamantly denies any SIHI, AVH, delusions, paranoia and or withdrawal symptoms. She left [facility] with all personal belongings in no distress. Transportation per [employer].  Consults:  psychiatry  Significant Diagnostic Studies:  labs: Lab value eviewed, stable  Discharge Vitals:   Blood pressure 157/89, pulse 64, temperature 98 F (36.7 C), temperature source Oral, resp. rate 18, height 5' 1.5" (1.562 m), weight 92.08 kg (203 lb). Body mass index is 37.74 kg/(m^2). Lab Results:   No results found for this or any previous visit (from the past 72 hour(s)).  Physical Findings: AIMS: Facial and Oral Movements Muscles of Facial Expression: None, normal Lips and Perioral Area: None, normal,Extremity Movements Upper (arms, wrists, hands, fingers): None, normal Lower (legs, knees, ankles, toes): None, normal, Trunk Movements Neck, shoulders, hips: None, normal, Overall Severity Severity of abnormal movements (highest score from questions above): None, normal Incapacitation due to abnormal movements: None, normal Patient's awareness of  abnormal movements (rate only patient's report): No Awareness, Dental Status Current problems with teeth and/or dentures?: No Does patient usually wear dentures?: No  CIWA:  CIWA-Ar Total: 0 COWS:     Psychiatric Specialty Exam: See Psychiatric Specialty Exam and Suicide Risk Assessment completed by Attending Physician prior to discharge.  Discharge destination:  [facility]  Is patient on multiple antipsychotic therapies at discharge:  No   Has Patient had three or more failed trials of antipsychotic monotherapy by history:  No  Recommended Plan for Multiple Antipsychotic Therapies: NA     Medication List    STOP taking these medications       hydrOXYzine 25 MG capsule  Commonly known as:  VISTARIL     ibuprofen 800 MG tablet  Commonly known as:  ADVIL,MOTRIN      TAKE these medications     Indication   albuterol 108 (90 BASE) MCG/ACT inhaler  Commonly known as:  PROVENTIL HFA;VENTOLIN HFA  Inhale 1 puff into the lungs every 6 (six) hours as needed for wheezing or shortness of breath.   Indication:  Asthma     ARIPiprazole 5 MG tablet  Commonly known as:  ABILIFY  Take 1 tablet (5 mg total) by mouth daily. For mood control   Indication:  Mood control     sertraline 100 MG tablet  Commonly known as:  ZOLOFT  Take 1 tablet (100 mg total)  by mouth at bedtime. For depression   Indication:  Major Depressive Disorder     traZODone 100 MG tablet  Commonly known as:  DESYREL  Take 1 tablet (100 mg total) by mouth at bedtime. For sleep   Indication:  Trouble Sleeping       Follow-up Information   Follow up with [facility] On 07/12/2013. ([facility] will pick you up at 2:30 pm today to transport to [facility] for 14 day inpatient treatment. )    Contact information:   [address] [place], [place] [address] Phone: [phone] Fax: [phone]      Follow up with [facility]. (Resume [facility] services, for medication management, therapy and support.  )    Contact information:   [address], [place] [address] Phone: [phone] Fax: [phone]     Follow-up recommendations: Activity:  As tolerated Diet: As recommended by your primary care doctor. Keep all scheduled follow-up appointments as recommended.  Continue to work your relapse prevention plan Comments:  Take all your medications as prescribed by your mental healthcare provider. Report any adverse effects and or reactions from your medicines to your outpatient provider promptly. Patient is instructed and cautioned to not engage in alcohol and or illegal drug use while on prescription medicines. In the event of worsening symptoms, patient is instructed to call the crisis hotline, 911 and or go to the nearest ED for appropriate evaluation and treatment of symptoms. Follow-up with your primary care provider for your other medical issues, concerns and or health care needs.   Total Discharge Time:  Greater than 30 minutes.  Signed: Encarnacion Slates, PMHNP-BC Agree with assessment and plan Geralyn Flash A. Sabra Heck, M.D. 07/12/2013, 12:28 PM

## 2013-07-12 NOTE — BHH Suicide Risk Assessment (Signed)
Suicide Risk Assessment  Discharge Assessment     Demographic Factors:  NA  Total Time spent with patient: 45 minutes  Psychiatric Specialty Exam:     Blood pressure 157/89, pulse 64, temperature 98 F (36.7 C), temperature source Oral, resp. rate 18, height 5' 1.5" (1.562 m), weight 92.08 kg (203 lb).Body mass index is 37.74 kg/(m^2).  General Appearance: Fairly Groomed  Engineer, water::  Fair  Speech:  Clear and Coherent, Slow and not spontaneous  Volume:  Decreased  Mood:  Euthymic  Affect:  Restricted  Thought Process:  Coherent and Goal Directed  Orientation:  Full (Time, Place, and Person)  Thought Content:  relapse prevention plan  Suicidal Thoughts:  No  Homicidal Thoughts:  No  Memory:  Immediate;   Fair Recent;   Fair Remote;   Fair  Judgement:  Fair  Insight:  Present and Shallow  Psychomotor Activity:  Decreased  Concentration:  Fair  Recall:  [employer] of Knowledge:NA  Language: Fair  Akathisia:  No  Handed:    AIMS (if indicated):     Assets:  Desire for Improvement  Sleep:  Number of Hours: 5.75    Musculoskeletal: Strength & Muscle Tone: within normal limits Gait & Station: normal Patient leans: N/A   Mental Status Per Nursing Assessment::   On Admission:  NA  Current Mental Status by Physician: In full contact with reality. She is willing and motivated to go to [facility]   Loss Factors: Decline in physical health  Historical Factors: NA  Risk Reduction Factors:   NA  Continued Clinical Symptoms:  Depression:   Comorbid alcohol abuse/dependence Alcohol/Substance Abuse/Dependencies  Cognitive Features That Contribute To Risk:  Closed-mindedness Polarized thinking Thought constriction (tunnel vision)    Suicide Risk:  Minimal: No identifiable suicidal ideation.  Patients presenting with no risk factors but with morbid ruminations; may be classified as minimal risk based on the severity of the depressive symptoms  Discharge Diagnoses:   AXIS I:  Alcohol Dependence, MDD, PTSD AXIS II:  Deferred AXIS III:   Past Medical History  Diagnosis Date  . Cancer 2002    left breast cancer tx in [place]   AXIS IV:  other psychosocial or environmental problems AXIS V:  61-70 mild symptoms  Plan Of Care/Follow-up recommendations:  Activity:  as tolerated Diet:  regular Follow up [facility] Is patient on multiple antipsychotic therapies at discharge:  No   Has Patient had three or more failed trials of antipsychotic monotherapy by history:  No  Recommended Plan for Multiple Antipsychotic Therapies: NA    Marissa Brooks A 07/12/2013, 1:04 PM

## 2013-07-12 NOTE — Progress Notes (Signed)
Patient ID: Marissa Brooks, female   DOB: 1956-03-26, 58 y.o.   MRN: [id number] D: Patient isolative in her room most of the evening. Pt mood/affect appeared depressed and flat. Pt denies any withdrawal symptoms. Pt denies suicidal /homicidal ideation intent and plan. Pt denies auditory and visual hallucination. Pt denies any need or concern. Cooperative with assessment. No acute distressed noted at this time.   A: Met with pt 1:1. Medications administered as prescribed. Writer encouraged pt to discuss feelings. Pt encouraged to come to staff with any questions or concerns.   R: Patient is safe on the unit. She is complaint with medications and denies any adverse reaction. Continue current POC.

## 2013-07-12 NOTE — Tx Team (Addendum)
Interdisciplinary Treatment Plan Update (Adult)  Date: 07/12/2013  Time Reviewed:  9:45 AM  Progress in Treatment: Attending groups: Yes Participating in groups:  Yes Taking medication as prescribed:  Yes Tolerating medication:  Yes Family/Significant othe contact made: No, N/A Patient understands diagnosis:  Yes Discussing patient identified problems/goals with staff:  Yes Medical problems stabilized or resolved:  Yes Denies suicidal/homicidal ideation: Yes Issues/concerns per patient self-inventory:  Yes Other:  New problem(s) identified: N/A  Discharge Plan or Barriers: Pt will follow up at [facility] for further inpatient treatment and [facility] for [facility] for outpatient treatment.    Reason for Continuation of Hospitalization: Stable to d/c  Comments: N/A  Estimated length of stay: D/C today   For review of initial/current patient goals, please see plan of care.  Attendees: Patient:     Family:     Physician:  Dr. Sabra Heck 07/12/2013 9:46 AM   Nursing:   Desma Paganini, RN 07/12/2013 9:46 AM   Clinical Social Worker:  Regan Lemming, LCSW 07/12/2013 9:46 AM   Other: Agustina Caroli, NP 07/12/2013 9:46 AM   Other: Eduard Roux, RN 07/12/2013 9:46 AM   Other:  Lars Pinks, case manager 07/12/2013 9:49 AM   Other:     Other:    Other:    Other:    Other:    Other:    Other:     Scribe for Treatment Team:   Ane Payment, 07/12/2013 , 9:46 AM

## 2013-07-12 NOTE — BHH Group Notes (Signed)
[facility] LCSW Aftercare Discharge Planning Group Note   07/12/2013 8:45 AM  Participation Quality:  Alert, Appropriate and Oriented  Mood/Affect:  Flat and Depressed  Depression Rating:  5-6  Anxiety Rating:  5-6  Thoughts of Suicide:  Pt denies SI/HI  Will you contract for safety?   Yes  Current AVH:  Pt denies  Plan for Discharge/Comments:  Pt attended discharge planning group and actively participated in group.  CSW provided pt with today's workbook.  Pt will be able to return home in [place] while waiting for a bed at [facility] for further inpatient treatment.  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  No further needs voiced by pt at this time.    Transportation Means: Pt reports access to transportation - states that counselor at [employer] will pick pt up  Supports: No supports mentioned at this time  Regan Lemming, [place] 07/12/2013 9:41 AM

## 2013-07-16 NOTE — Progress Notes (Signed)
Patient Discharge Instructions:  After Visit Summary (AVS):   Faxed to:  07/16/13 Discharge Summary Note:   Faxed to:  07/16/13 Psychiatric Admission Assessment Note:   Faxed to:  07/16/13 Suicide Risk Assessment - Discharge Assessment:   Faxed to:  07/16/13 Faxed/Sent to the Next Level Care provider:  07/16/13 Faxed to [facility] @ [phone] Faxed to [facility] @ [phone]  Patsey Berthold, 07/16/2013, 3:34 PM

## 2013-11-11 ENCOUNTER — Other Ambulatory Visit (HOSPITAL_COMMUNITY): Payer: Self-pay | Admitting: Internal Medicine

## 2013-11-11 ENCOUNTER — Ambulatory Visit (HOSPITAL_COMMUNITY)
Admission: RE | Admit: 2013-11-11 | Discharge: 2013-11-11 | Disposition: A | Discharge status: Home / Self Care | Payer: Medicare HMO | Source: Ambulatory Visit | Attending: Internal Medicine | Admitting: Internal Medicine

## 2013-11-11 DIAGNOSIS — M25569 Pain in unspecified knee: Secondary | ICD-10-CM | POA: Insufficient documentation

## 2013-11-11 DIAGNOSIS — R52 Pain, unspecified: Secondary | ICD-10-CM

## 2014-04-14 ENCOUNTER — Other Ambulatory Visit: Payer: Self-pay

## 2014-04-14 DIAGNOSIS — Z9012 Acquired absence of left breast and nipple: Secondary | ICD-10-CM

## 2014-04-14 DIAGNOSIS — N644 Mastodynia: Secondary | ICD-10-CM

## 2014-04-16 ENCOUNTER — Other Ambulatory Visit: Payer: Self-pay

## 2014-04-16 DIAGNOSIS — Z9012 Acquired absence of left breast and nipple: Secondary | ICD-10-CM

## 2014-04-16 DIAGNOSIS — N644 Mastodynia: Secondary | ICD-10-CM

## 2014-05-01 ENCOUNTER — Other Ambulatory Visit: Payer: Self-pay

## 2014-05-27 ENCOUNTER — Other Ambulatory Visit: Payer: Self-pay

## 2014-06-06 ENCOUNTER — Ambulatory Visit
Admission: RE | Admit: 2014-06-06 | Discharge: 2014-06-06 | Disposition: A | Discharge status: Home / Self Care | Payer: Medicare HMO | Source: Ambulatory Visit

## 2014-06-06 ENCOUNTER — Other Ambulatory Visit: Payer: Self-pay

## 2014-06-06 DIAGNOSIS — Z9012 Acquired absence of left breast and nipple: Secondary | ICD-10-CM

## 2014-06-06 DIAGNOSIS — N644 Mastodynia: Secondary | ICD-10-CM

## 2014-06-09 ENCOUNTER — Other Ambulatory Visit: Payer: Self-pay | Admitting: Internal Medicine

## 2014-06-09 DIAGNOSIS — Z9012 Acquired absence of left breast and nipple: Secondary | ICD-10-CM

## 2014-06-09 DIAGNOSIS — N644 Mastodynia: Secondary | ICD-10-CM

## 2014-06-12 ENCOUNTER — Other Ambulatory Visit: Payer: Self-pay

## 2014-06-12 ENCOUNTER — Other Ambulatory Visit: Payer: Self-pay | Admitting: Internal Medicine

## 2014-06-12 DIAGNOSIS — N644 Mastodynia: Secondary | ICD-10-CM

## 2014-06-12 DIAGNOSIS — Z9012 Acquired absence of left breast and nipple: Secondary | ICD-10-CM

## 2014-06-19 ENCOUNTER — Other Ambulatory Visit: Payer: Self-pay

## 2014-06-30 ENCOUNTER — Other Ambulatory Visit: Payer: Self-pay

## 2014-07-18 ENCOUNTER — Inpatient Hospital Stay: Admission: RE | Admit: 2014-07-18 | Payer: Self-pay | Source: Ambulatory Visit

## 2014-08-12 ENCOUNTER — Ambulatory Visit
Admission: RE | Admit: 2014-08-12 | Discharge: 2014-08-12 | Disposition: A | Discharge status: Home / Self Care | Payer: Medicare HMO | Source: Ambulatory Visit | Attending: Internal Medicine | Admitting: Internal Medicine

## 2014-08-12 DIAGNOSIS — Z9012 Acquired absence of left breast and nipple: Secondary | ICD-10-CM

## 2014-08-12 DIAGNOSIS — N644 Mastodynia: Secondary | ICD-10-CM

## 2014-08-13 ENCOUNTER — Other Ambulatory Visit: Payer: Self-pay | Admitting: Internal Medicine

## 2014-08-13 ENCOUNTER — Other Ambulatory Visit: Payer: Self-pay | Admitting: Medical Oncology

## 2014-08-13 DIAGNOSIS — C50911 Malignant neoplasm of unspecified site of right female breast: Secondary | ICD-10-CM

## 2014-08-18 ENCOUNTER — Other Ambulatory Visit: Payer: Self-pay

## 2014-08-19 ENCOUNTER — Other Ambulatory Visit: Payer: Self-pay | Admitting: Internal Medicine

## 2014-08-19 DIAGNOSIS — C50911 Malignant neoplasm of unspecified site of right female breast: Secondary | ICD-10-CM

## 2014-09-03 ENCOUNTER — Telehealth: Payer: Self-pay | Admitting: *Deleted

## 2014-09-03 NOTE — Telephone Encounter (Signed)
Could not get through on pts phone so I called and left a message for her emergency contact to have pt to call me.

## 2014-09-10 DIAGNOSIS — C50919 Malignant neoplasm of unspecified site of unspecified female breast: Secondary | ICD-10-CM

## 2014-09-10 HISTORY — DX: Malignant neoplasm of unspecified site of unspecified female breast: C50.919

## 2014-09-11 ENCOUNTER — Telehealth: Payer: Self-pay | Admitting: *Deleted

## 2014-09-11 NOTE — Telephone Encounter (Signed)
Cannot get through to pt on either line.  Left another message for emergency contact.  Emailed Engineer, civil (consulting) at [employer] to make her aware.

## 2014-09-12 ENCOUNTER — Other Ambulatory Visit: Payer: Self-pay | Admitting: Internal Medicine

## 2014-09-12 DIAGNOSIS — C50911 Malignant neoplasm of unspecified site of right female breast: Secondary | ICD-10-CM

## 2014-10-08 ENCOUNTER — Other Ambulatory Visit: Payer: Self-pay | Admitting: General Surgery

## 2014-10-08 DIAGNOSIS — C50911 Malignant neoplasm of unspecified site of right female breast: Secondary | ICD-10-CM

## 2014-10-18 NOTE — Pre-Procedure Instructions (Signed)
Marissa Brooks  10/18/2014        Your procedure is scheduled on June 9.  Report to [facility] Admitting at 7 A.M.  Call this number if you have problems the morning of surgery:  [phone]   Remember:  Do not eat food or drink liquids after midnight.  Take these medicines the morning of surgery with A SIP OF WATER Abilify   STOP Naproxen June 2   STOP/ Do not take Aspirin, Aleve, Naproxen, Advil, Ibuprofen, Motrin, Vitamins, Herbs, or Supplements starting June 2   Do not wear jewelry, make-up or nail polish.  Do not wear lotions, powders, or perfumes.  You may wear deodorant.  Do not shave 48 hours prior to surgery.  Men may shave face and neck.  Do not bring valuables to the hospital.  [facility] is not responsible for any belongings or valuables.  Contacts, dentures or bridgework may not be worn into surgery.  Leave your suitcase in the car.  After surgery it may be brought to your room.  For patients admitted to the hospital, discharge time will be determined by your treatment team.  Patients discharged the day of surgery will not be allowed to drive home.   [facility] - Preparing for Surgery  Before surgery, you can play an important role.  Because skin is not sterile, your skin needs to be as free of germs as possible.  You can reduce the number of germs on you skin by washing with CHG (chlorahexidine gluconate) soap before surgery.  CHG is an antiseptic cleaner which kills germs and bonds with the skin to continue killing germs even after washing.  Please DO NOT use if you have an allergy to CHG or antibacterial soaps.  If your skin becomes reddened/irritated stop using the CHG and inform your nurse when you arrive at Short Stay.  Do not shave (including legs and underarms) for at least 48 hours prior to the first CHG shower.  You may shave your face.  Please follow these instructions carefully:   1.  Shower with CHG Soap the night before surgery and  the morning of Surgery.  2.  If you choose to wash your hair, wash your hair first as usual with your normal shampoo.  3.  After you shampoo, rinse your hair and body thoroughly to remove the shampoo.  4.  Use CHG as you would any other liquid soap.  You can apply CHG directly to the skin and wash gently with scrungie or a clean washcloth.  5.  Apply the CHG Soap to your body ONLY FROM THE NECK DOWN.  Do not use on open wounds or open sores.  Avoid contact with your eyes, ears, mouth and genitals (private parts).  Wash genitals (private parts) with your normal soap.  6.  Wash thoroughly, paying special attention to the area where your surgery will be performed.  7.  Thoroughly rinse your body with warm water from the neck down.  8.  DO NOT shower/wash with your normal soap after using and rinsing off the CHG Soap.  9.  Pat yourself dry with a clean towel.            10.  Wear clean pajamas.            11.  Place clean sheets on your bed the night of your first shower and do not sleep with pets.  Day of Surgery  Do not apply any  lotions the morning of surgery.  Please wear clean clothes to the hospital/surgery center.    Please read over the following fact sheets that you were given. Pain Booklet, Coughing and Deep Breathing and Surgical Site Infection Prevention

## 2014-10-21 ENCOUNTER — Encounter (HOSPITAL_COMMUNITY): Payer: Self-pay

## 2014-10-21 ENCOUNTER — Encounter (HOSPITAL_COMMUNITY)
Admission: RE | Admit: 2014-10-21 | Discharge: 2014-10-21 | Disposition: A | Discharge status: Home / Self Care | Payer: Medicare HMO | Source: Ambulatory Visit | Attending: General Surgery | Admitting: General Surgery

## 2014-10-21 DIAGNOSIS — J449 Chronic obstructive pulmonary disease, unspecified: Secondary | ICD-10-CM | POA: Diagnosis not present

## 2014-10-21 DIAGNOSIS — Z170 Estrogen receptor positive status [ER+]: Secondary | ICD-10-CM | POA: Diagnosis not present

## 2014-10-21 DIAGNOSIS — C50411 Malignant neoplasm of upper-outer quadrant of right female breast: Secondary | ICD-10-CM | POA: Diagnosis not present

## 2014-10-21 DIAGNOSIS — K219 Gastro-esophageal reflux disease without esophagitis: Secondary | ICD-10-CM | POA: Diagnosis not present

## 2014-10-21 DIAGNOSIS — F17200 Nicotine dependence, unspecified, uncomplicated: Secondary | ICD-10-CM | POA: Diagnosis not present

## 2014-10-21 DIAGNOSIS — Z6823 Body mass index (BMI) 23.0-23.9, adult: Secondary | ICD-10-CM | POA: Diagnosis not present

## 2014-10-21 DIAGNOSIS — Z9012 Acquired absence of left breast and nipple: Secondary | ICD-10-CM | POA: Diagnosis not present

## 2014-10-21 DIAGNOSIS — F319 Bipolar disorder, unspecified: Secondary | ICD-10-CM | POA: Diagnosis not present

## 2014-10-21 DIAGNOSIS — Z853 Personal history of malignant neoplasm of breast: Secondary | ICD-10-CM | POA: Diagnosis not present

## 2014-10-21 DIAGNOSIS — I10 Essential (primary) hypertension: Secondary | ICD-10-CM | POA: Diagnosis not present

## 2014-10-21 DIAGNOSIS — C50911 Malignant neoplasm of unspecified site of right female breast: Secondary | ICD-10-CM | POA: Diagnosis present

## 2014-10-21 HISTORY — DX: Major depressive disorder, single episode, unspecified: F32.9

## 2014-10-21 HISTORY — DX: Reserved for inherently not codable concepts without codable children: IMO0001

## 2014-10-21 HISTORY — DX: Pneumonia, unspecified organism: J18.9

## 2014-10-21 HISTORY — DX: Depression, unspecified: F32.A

## 2014-10-21 HISTORY — DX: Essential (primary) hypertension: I10

## 2014-10-21 HISTORY — DX: Anxiety disorder, unspecified: F41.9

## 2014-10-21 HISTORY — DX: Bipolar disorder, unspecified: F31.9

## 2014-10-21 HISTORY — DX: Alcohol abuse, uncomplicated: F10.10

## 2014-10-21 HISTORY — DX: Cocaine abuse, uncomplicated: F14.10

## 2014-10-21 HISTORY — DX: Gastro-esophageal reflux disease without esophagitis: K21.9

## 2014-10-21 HISTORY — DX: Unspecified osteoarthritis, unspecified site: M19.90

## 2014-10-21 LAB — COMPREHENSIVE METABOLIC PANEL
ALT: 44 U/L (ref 14–54)
AST: 38 U/L (ref 15–41)
Albumin: 3.9 g/dL (ref 3.5–5.0)
Alkaline Phosphatase: 65 U/L (ref 38–126)
Anion gap: 8 (ref 5–15)
BILIRUBIN TOTAL: 0.6 mg/dL (ref 0.3–1.2)
BUN: 12 mg/dL (ref 6–20)
CHLORIDE: 105 mmol/L (ref 101–111)
CO2: 25 mmol/L (ref 22–32)
CREATININE: 1.03 mg/dL — AB (ref 0.44–1.00)
Calcium: 9.5 mg/dL (ref 8.9–10.3)
GFR calc Af Amer: >60 mL/min (ref >60)
GFR, EST NON AFRICAN AMERICAN: 60 mL/min — AB (ref >60)
GLUCOSE: 94 mg/dL (ref 65–99)
Potassium: 4.4 mmol/L (ref 3.5–5.1)
Sodium: 138 mmol/L (ref 135–145)
Total Protein: 7.6 g/dL (ref 6.5–8.1)

## 2014-10-21 LAB — CBC
HEMATOCRIT: 39.4 % (ref 36.0–46.0)
Hemoglobin: 13.5 g/dL (ref 12.0–15.0)
MCH: 29.3 pg (ref 26.0–34.0)
MCHC: 34.3 g/dL (ref 30.0–36.0)
MCV: 85.7 fL (ref 78.0–100.0)
Platelets: 174 10*3/uL (ref 150–400)
RBC: 4.6 MIL/uL (ref 3.87–5.11)
RDW: 13.4 % (ref 11.5–15.5)
WBC: 6.9 10*3/uL (ref 4.0–10.5)

## 2014-10-21 NOTE — Pre-Procedure Instructions (Signed)
Marissa Brooks  10/21/2014      Your procedure is scheduled on Thursday, October 30, 2014 at 9:00 AM.   Report to [facility] Entrance "A" Admitting Office  at 7:00 AM.   Call this number if you have problems the morning of surgery: [phone]              Any questions prior to day of surgery, please call [phone] between 8 & 4 PM.      Remember:  Do not eat food or drink liquids after midnight Wednesday, 10/29/14.  Take these medicines the morning of surgery with A SIP OF WATER: Aripiprazole (Abilify)              Stop NSAIDS (Naproxen, Aleve, Ibuprofen) 7 days prior to surgery.   Do not wear jewelry, make-up or nail polish.  Do not wear lotions, powders, or perfumes.  You may NOT wear deodorant.  Do not shave 48 hours prior to surgery.  Men may shave face and neck.  Do not bring valuables to the hospital.  [facility] is not responsible for any belongings or valuables.  Contacts, dentures or bridgework may not be worn into surgery.  Leave your suitcase in the car.  After surgery it may be brought to your room.  For patients admitted to the hospital, discharge time will be determined by your treatment team.  Patients discharged the day of surgery will not be allowed to drive home.   Special instructions:  [place] - Preparing for Surgery  Before surgery, you can play an important role.  Because skin is not sterile, your skin needs to be as free of germs as possible.  You can reduce the number of germs on you skin by washing with CHG (chlorahexidine gluconate) soap before surgery.  CHG is an antiseptic cleaner which kills germs and bonds with the skin to continue killing germs even after washing.  Please DO NOT use if you have an allergy to CHG or antibacterial soaps.  If your skin becomes reddened/irritated stop using the CHG and inform your nurse when you arrive at Short Stay.  Do not shave (including legs and underarms) for at least 48 hours prior to the first  CHG shower.  You may shave your face.  Please follow these instructions carefully:   1.  Shower with CHG Soap the night before surgery and the                                morning of Surgery.  2.  If you choose to wash your hair, wash your hair first as usual with your       normal shampoo.  3.  After you shampoo, rinse your hair and body thoroughly to remove the                      Shampoo.  4.  Use CHG as you would any other liquid soap.  You can apply chg directly       to the skin and wash gently with scrungie or a clean washcloth.  5.  Apply the CHG Soap to your body ONLY FROM THE NECK DOWN.        Do not use on open wounds or open sores.  Avoid contact with your eyes, ears, mouth and genitals (private parts).  Wash genitals (private parts) with your normal soap.  6.  Wash thoroughly, paying special attention to the area where your surgery        will be performed.  7.  Thoroughly rinse your body with warm water from the neck down.  8.  DO NOT shower/wash with your normal soap after using and rinsing off       the CHG Soap.  9.  Pat yourself dry with a clean towel.            10.  Wear clean pajamas.            11.  Place clean sheets on your bed the night of your first shower and do not        sleep with pets.  Day of Surgery  Do not apply any lotions/deodorants the morning of surgery.  Please wear clean clothes to the hospital.    Please read over the following fact sheets that you were given. Pain Booklet, Coughing and Deep Breathing and Surgical Site Infection Prevention

## 2014-10-21 NOTE — Progress Notes (Deleted)
   10/21/14 1346  OBSTRUCTIVE SLEEP APNEA  Have you ever been diagnosed with sleep apnea through a sleep study? No  Do you snore loudly (loud enough to be heard through closed doors)?  1  Do you often feel tired, fatigued, or sleepy during the daytime? 1  Has anyone observed you stop breathing during your sleep? 0  Do you have, or are you being treated for high blood pressure? 1  BMI more than 35 kg/m2? 1  Age over 57 years old? 1  Neck circumference greater than 40 cm/16 inches? 0  Gender: 0

## 2014-10-29 MED ORDER — CEFAZOLIN SODIUM-DEXTROSE 2-3 GM-% IV SOLR
2.0000 g | INTRAVENOUS | Status: AC
  Administered 2014-10-30: 2 g via INTRAVENOUS
  Filled 2014-10-29: qty 50

## 2014-10-30 ENCOUNTER — Ambulatory Visit (HOSPITAL_COMMUNITY): Payer: Medicare HMO | Admitting: Anesthesiology

## 2014-10-30 ENCOUNTER — Encounter (HOSPITAL_COMMUNITY)
Admission: RE | Admit: 2014-10-30 | Discharge: 2014-10-30 | Disposition: A | Discharge status: Home / Self Care | Payer: Medicare HMO | Source: Ambulatory Visit | Attending: General Surgery | Admitting: General Surgery

## 2014-10-30 ENCOUNTER — Encounter (HOSPITAL_COMMUNITY): Payer: Self-pay | Admitting: *Deleted

## 2014-10-30 ENCOUNTER — Encounter (HOSPITAL_COMMUNITY)
Admission: RE | Disposition: A | Discharge status: Home / Self Care | Payer: Self-pay | Source: Ambulatory Visit | Attending: General Surgery

## 2014-10-30 ENCOUNTER — Ambulatory Visit (HOSPITAL_COMMUNITY)
Admission: RE | Admit: 2014-10-30 | Discharge: 2014-10-31 | Disposition: A | Discharge status: Home / Self Care | Payer: Medicare HMO | Source: Ambulatory Visit | Attending: General Surgery | Admitting: General Surgery

## 2014-10-30 DIAGNOSIS — C50411 Malignant neoplasm of upper-outer quadrant of right female breast: Secondary | ICD-10-CM | POA: Diagnosis present

## 2014-10-30 DIAGNOSIS — J449 Chronic obstructive pulmonary disease, unspecified: Secondary | ICD-10-CM | POA: Insufficient documentation

## 2014-10-30 DIAGNOSIS — I10 Essential (primary) hypertension: Secondary | ICD-10-CM | POA: Insufficient documentation

## 2014-10-30 DIAGNOSIS — Z170 Estrogen receptor positive status [ER+]: Secondary | ICD-10-CM | POA: Insufficient documentation

## 2014-10-30 DIAGNOSIS — K219 Gastro-esophageal reflux disease without esophagitis: Secondary | ICD-10-CM | POA: Insufficient documentation

## 2014-10-30 DIAGNOSIS — Z9012 Acquired absence of left breast and nipple: Secondary | ICD-10-CM | POA: Insufficient documentation

## 2014-10-30 DIAGNOSIS — C50911 Malignant neoplasm of unspecified site of right female breast: Secondary | ICD-10-CM

## 2014-10-30 DIAGNOSIS — Z6823 Body mass index (BMI) 23.0-23.9, adult: Secondary | ICD-10-CM | POA: Insufficient documentation

## 2014-10-30 DIAGNOSIS — F17200 Nicotine dependence, unspecified, uncomplicated: Secondary | ICD-10-CM | POA: Diagnosis not present

## 2014-10-30 DIAGNOSIS — Z853 Personal history of malignant neoplasm of breast: Secondary | ICD-10-CM | POA: Diagnosis not present

## 2014-10-30 DIAGNOSIS — F319 Bipolar disorder, unspecified: Secondary | ICD-10-CM | POA: Insufficient documentation

## 2014-10-30 HISTORY — PX: SIMPLE MASTECTOMY WITH AXILLARY SENTINEL NODE BIOPSY: SHX6098

## 2014-10-30 HISTORY — PX: MASTECTOMY W/ SENTINEL NODE BIOPSY: SHX2001

## 2014-10-30 SURGERY — SIMPLE MASTECTOMY WITH AXILLARY SENTINEL NODE BIOPSY
Anesthesia: General | Site: Breast | Laterality: Right

## 2014-10-30 MED ORDER — TRAZODONE HCL 100 MG PO TABS: 100.0000 mg | ORAL_TABLET | Freq: Every evening | ORAL | Status: DC | PRN

## 2014-10-30 MED ORDER — 0.9 % SODIUM CHLORIDE (POUR BTL) OPTIME
TOPICAL | Status: DC | PRN
  Administered 2014-10-30 (×2): 1000 mL

## 2014-10-30 MED ORDER — FENTANYL CITRATE (PF) 100 MCG/2ML IJ SOLN
INTRAMUSCULAR | Status: DC | PRN
  Administered 2014-10-30 (×3): 25 ug via INTRAVENOUS
  Administered 2014-10-30: 50 ug via INTRAVENOUS
  Administered 2014-10-30: 25 ug via INTRAVENOUS

## 2014-10-30 MED ORDER — LIDOCAINE HCL (CARDIAC) 20 MG/ML IV SOLN
INTRAVENOUS | Status: DC | PRN
  Administered 2014-10-30: 40 mg via INTRAVENOUS

## 2014-10-30 MED ORDER — LACTATED RINGERS IV SOLN
INTRAVENOUS | Status: DC
  Administered 2014-10-30 (×2): via INTRAVENOUS

## 2014-10-30 MED ORDER — LIDOCAINE HCL (CARDIAC) 20 MG/ML IV SOLN
INTRAVENOUS | Status: AC
  Filled 2014-10-30: qty 5

## 2014-10-30 MED ORDER — ATROPINE SULFATE 0.1 MG/ML IJ SOLN
INTRAMUSCULAR | Status: AC
  Filled 2014-10-30: qty 10

## 2014-10-30 MED ORDER — MEPERIDINE HCL 25 MG/ML IJ SOLN: 6.2500 mg | INTRAMUSCULAR | Status: DC | PRN

## 2014-10-30 MED ORDER — ONDANSETRON HCL 4 MG/2ML IJ SOLN: 4.0000 mg | Freq: Four times a day (QID) | INTRAMUSCULAR | Status: DC | PRN

## 2014-10-30 MED ORDER — ONDANSETRON HCL 4 MG PO TABS: 4.0000 mg | ORAL_TABLET | Freq: Four times a day (QID) | ORAL | Status: DC | PRN

## 2014-10-30 MED ORDER — BUPIVACAINE-EPINEPHRINE (PF) 0.5% -1:200000 IJ SOLN
INTRAMUSCULAR | Status: DC | PRN
  Administered 2014-10-30: 30 mL

## 2014-10-30 MED ORDER — FENTANYL CITRATE (PF) 100 MCG/2ML IJ SOLN
INTRAMUSCULAR | Status: AC
  Administered 2014-10-30: 100 ug
  Filled 2014-10-30: qty 2

## 2014-10-30 MED ORDER — TECHNETIUM TC 99M SULFUR COLLOID FILTERED
1.0000 | Freq: Once | INTRAVENOUS | Status: AC | PRN
  Administered 2014-10-30: 1 via INTRADERMAL

## 2014-10-30 MED ORDER — MORPHINE SULFATE 2 MG/ML IJ SOLN
1.0000 mg | INTRAMUSCULAR | Status: DC | PRN
  Administered 2014-10-31: 2 mg via INTRAVENOUS
  Filled 2014-10-30: qty 1

## 2014-10-30 MED ORDER — LISINOPRIL 10 MG PO TABS
10.0000 mg | ORAL_TABLET | Freq: Every day | ORAL | Status: DC
  Administered 2014-10-31: 10 mg via ORAL
  Filled 2014-10-30: qty 1

## 2014-10-30 MED ORDER — CHLORHEXIDINE GLUCONATE 4 % EX LIQD: 1.0000 "application " | Freq: Once | CUTANEOUS | Status: DC

## 2014-10-30 MED ORDER — HEPARIN SODIUM (PORCINE) 5000 UNIT/ML IJ SOLN
5000.0000 [IU] | Freq: Three times a day (TID) | INTRAMUSCULAR | Status: DC
  Administered 2014-10-31: 5000 [IU] via SUBCUTANEOUS
  Filled 2014-10-30: qty 1

## 2014-10-30 MED ORDER — HYDROMORPHONE HCL 1 MG/ML IJ SOLN
INTRAMUSCULAR | Status: AC
  Filled 2014-10-30: qty 1

## 2014-10-30 MED ORDER — MIDAZOLAM HCL 2 MG/2ML IJ SOLN
INTRAMUSCULAR | Status: AC
  Administered 2014-10-30: 2 mg
  Filled 2014-10-30: qty 2

## 2014-10-30 MED ORDER — ONDANSETRON HCL 4 MG/2ML IJ SOLN
INTRAMUSCULAR | Status: DC | PRN
  Administered 2014-10-30: 4 mg via INTRAVENOUS

## 2014-10-30 MED ORDER — OXYCODONE-ACETAMINOPHEN 5-325 MG PO TABS
ORAL_TABLET | ORAL | Status: AC
  Filled 2014-10-30: qty 2

## 2014-10-30 MED ORDER — PROPOFOL 10 MG/ML IV BOLUS
INTRAVENOUS | Status: AC
  Filled 2014-10-30: qty 20

## 2014-10-30 MED ORDER — PROMETHAZINE HCL 25 MG/ML IJ SOLN
6.2500 mg | INTRAMUSCULAR | Status: DC | PRN
Start: 2014-10-30 — End: 2014-10-30

## 2014-10-30 MED ORDER — FENTANYL CITRATE (PF) 250 MCG/5ML IJ SOLN
INTRAMUSCULAR | Status: AC
  Filled 2014-10-30: qty 5

## 2014-10-30 MED ORDER — KCL IN DEXTROSE-NACL 20-5-0.9 MEQ/L-%-% IV SOLN
INTRAVENOUS | Status: DC
  Administered 2014-10-30 – 2014-10-31 (×2): via INTRAVENOUS
  Filled 2014-10-30 (×3): qty 1000

## 2014-10-30 MED ORDER — ONDANSETRON HCL 4 MG/2ML IJ SOLN
INTRAMUSCULAR | Status: AC
  Filled 2014-10-30: qty 2

## 2014-10-30 MED ORDER — METHYLENE BLUE 1 % INJ SOLN
INTRAMUSCULAR | Status: AC
  Filled 2014-10-30: qty 10

## 2014-10-30 MED ORDER — GLYCOPYRROLATE 0.2 MG/ML IJ SOLN
INTRAMUSCULAR | Status: AC
  Filled 2014-10-30: qty 1

## 2014-10-30 MED ORDER — HYDROMORPHONE HCL 1 MG/ML IJ SOLN
0.2500 mg | INTRAMUSCULAR | Status: DC | PRN
  Administered 2014-10-30 (×2): 0.5 mg via INTRAVENOUS

## 2014-10-30 MED ORDER — MIDAZOLAM HCL 2 MG/2ML IJ SOLN: 0.5000 mg | Freq: Once | INTRAMUSCULAR | Status: DC | PRN

## 2014-10-30 MED ORDER — OXYCODONE-ACETAMINOPHEN 5-325 MG PO TABS
1.0000 | ORAL_TABLET | ORAL | Status: DC | PRN
  Administered 2014-10-30 – 2014-10-31 (×3): 2 via ORAL
  Filled 2014-10-30 (×2): qty 2

## 2014-10-30 MED ORDER — PROPOFOL 10 MG/ML IV BOLUS
INTRAVENOUS | Status: DC | PRN
  Administered 2014-10-30: 200 mg via INTRAVENOUS

## 2014-10-30 MED ORDER — MIDAZOLAM HCL 2 MG/2ML IJ SOLN
INTRAMUSCULAR | Status: AC
  Filled 2014-10-30: qty 2

## 2014-10-30 MED ORDER — SODIUM CHLORIDE 0.9 % IJ SOLN
INTRAMUSCULAR | Status: AC
  Filled 2014-10-30: qty 10

## 2014-10-30 MED ORDER — SERTRALINE HCL 100 MG PO TABS
100.0000 mg | ORAL_TABLET | Freq: Every day | ORAL | Status: DC
  Administered 2014-10-30: 100 mg via ORAL
  Filled 2014-10-30: qty 1

## 2014-10-30 SURGICAL SUPPLY — 53 items
APPLIER CLIP 9.375 MED OPEN (MISCELLANEOUS) ×6
APR CLP MED 9.3 20 MLT OPN (MISCELLANEOUS) ×2
BINDER BREAST LRG (GAUZE/BANDAGES/DRESSINGS) IMPLANT
BINDER BREAST XLRG (GAUZE/BANDAGES/DRESSINGS) ×3 IMPLANT
CANISTER SUCTION 2500CC (MISCELLANEOUS) ×3 IMPLANT
CHLORAPREP W/TINT 26ML (MISCELLANEOUS) ×3 IMPLANT
CLIP APPLIE 9.375 MED OPEN (MISCELLANEOUS) ×2 IMPLANT
CONT SPEC 4OZ CLIKSEAL STRL BL (MISCELLANEOUS) ×3 IMPLANT
COVER PROBE W GEL 5X96 (DRAPES) ×3 IMPLANT
COVER SURGICAL LIGHT HANDLE (MISCELLANEOUS) ×3 IMPLANT
DEVICE DISSECT PLASMABLAD 3.0S (MISCELLANEOUS) ×1 IMPLANT
DRAIN CHANNEL 19F RND (DRAIN) ×6 IMPLANT
DRAPE LAPAROSCOPIC ABDOMINAL (DRAPES) ×3 IMPLANT
DRAPE UTILITY XL STRL (DRAPES) ×3 IMPLANT
DRSG PAD ABDOMINAL 8X10 ST (GAUZE/BANDAGES/DRESSINGS) ×6 IMPLANT
ELECT CAUTERY BLADE 6.4 (BLADE) ×3 IMPLANT
ELECT REM PT RETURN 9FT ADLT (ELECTROSURGICAL) ×3
ELECTRODE REM PT RTRN 9FT ADLT (ELECTROSURGICAL) ×1 IMPLANT
EVACUATOR SILICONE 100CC (DRAIN) ×6 IMPLANT
GAUZE SPONGE 4X4 12PLY STRL (GAUZE/BANDAGES/DRESSINGS) ×3 IMPLANT
GAUZE XEROFORM 5X9 LF (GAUZE/BANDAGES/DRESSINGS) ×3 IMPLANT
GLOVE BIO SURGEON STRL SZ 6.5 (GLOVE) ×2 IMPLANT
GLOVE BIO SURGEON STRL SZ7 (GLOVE) ×3 IMPLANT
GLOVE BIO SURGEON STRL SZ7.5 (GLOVE) ×3 IMPLANT
GLOVE BIO SURGEONS STRL SZ 6.5 (GLOVE) ×1
GLOVE BIOGEL PI IND STRL 7.0 (GLOVE) ×1 IMPLANT
GLOVE BIOGEL PI IND STRL 7.5 (GLOVE) ×1 IMPLANT
GLOVE BIOGEL PI INDICATOR 7.0 (GLOVE) ×2
GLOVE BIOGEL PI INDICATOR 7.5 (GLOVE) ×2
GLOVE SS N UNI LF 7.0 STRL (GLOVE) ×3 IMPLANT
GOWN STRL REUS W/ TWL LRG LVL3 (GOWN DISPOSABLE) ×3 IMPLANT
GOWN STRL REUS W/TWL LRG LVL3 (GOWN DISPOSABLE) ×9
KIT BASIN OR (CUSTOM PROCEDURE TRAY) ×3 IMPLANT
KIT ROOM TURNOVER OR (KITS) ×3 IMPLANT
LIQUID BAND (GAUZE/BANDAGES/DRESSINGS) ×3 IMPLANT
NS IRRIG 1000ML POUR BTL (IV SOLUTION) ×6 IMPLANT
PACK GENERAL/GYN (CUSTOM PROCEDURE TRAY) ×3 IMPLANT
PAD ABD 8X10 STRL (GAUZE/BANDAGES/DRESSINGS) ×3 IMPLANT
PAD ARMBOARD 7.5X6 YLW CONV (MISCELLANEOUS) ×3 IMPLANT
PLASMABLADE 3.0S (MISCELLANEOUS) ×3
SPECIMEN JAR X LARGE (MISCELLANEOUS) ×3 IMPLANT
STAPLER VISISTAT 35W (STAPLE) ×3 IMPLANT
SUT ETHILON 3 0 FSL (SUTURE) ×6 IMPLANT
SUT MON AB 4-0 PC3 18 (SUTURE) ×3 IMPLANT
SUT VIC AB 3-0 54X BRD REEL (SUTURE) ×1 IMPLANT
SUT VIC AB 3-0 BRD 54 (SUTURE) ×3
SUT VIC AB 3-0 SH 18 (SUTURE) ×3 IMPLANT
TAPE CLOTH SURG 4X10 WHT LF (GAUZE/BANDAGES/DRESSINGS) ×3 IMPLANT
TOWEL OR 17X24 6PK STRL BLUE (TOWEL DISPOSABLE) IMPLANT
TOWEL OR 17X26 10 PK STRL BLUE (TOWEL DISPOSABLE) ×3 IMPLANT
TUBE CONNECTING 12'X1/4 (SUCTIONS) ×1
TUBE CONNECTING 12X1/4 (SUCTIONS) ×2 IMPLANT
WATER STERILE IRR 1000ML POUR (IV SOLUTION) IMPLANT

## 2014-10-30 NOTE — Anesthesia Procedure Notes (Addendum)
Anesthesia Regional Block:  Pectoralis block  Pre-Anesthetic Checklist: ,, timeout performed, Correct Patient, Correct Site, Correct Laterality, Correct Procedure, Correct Position, site marked, Risks and benefits discussed,  Surgical consent,  Pre-op evaluation,  At surgeon's request and post-op pain management  Laterality: Right  Prep: chloraprep       Needles:  Injection technique: Single-shot  Needle Type: Echogenic Stimulator Needle     Needle Length: 9cm 9 cm Needle Gauge: 22 and 22 G    Additional Needles:  Procedures: ultrasound guided (picture in chart) Pectoralis block Narrative:  Start time: 10/30/2014 8:20 AM End time: 10/30/2014 8:26 AM Injection made incrementally with aspirations every 5 mL.  Performed by: Personally  Anesthesiologist: Glennon Mac, [place]  Additional Notes: Pt identified in Holding room.  Monitors applied. Working IV access confirmed. Sterile prep R chest.  #22ga ECHOgenic needle into subserratus space ribs 4,5 with US guidance.  30cc 0.5% Bupivacaine with 1:200k epi injected incrementally after negative test dose. Good spread of local anesthetic. Patient asymptomatic, VSS, no heme aspirated, tolerated well.  Jenita Seashore, MD   Procedure Name: LMA Insertion Date/Time: 10/30/2014 9:01 AM Performed by: Julian Reil Pre-anesthesia Checklist: Patient identified, Emergency Drugs available, Suction available and Patient being monitored Patient Re-evaluated:Patient Re-evaluated prior to inductionOxygen Delivery Method: Circle system utilized Preoxygenation: Pre-oxygenation with 100% oxygen Intubation Type: IV induction Ventilation: Mask ventilation without difficulty LMA: LMA inserted LMA Size: 4.0 Tube type: Oral Number of attempts: 1 Placement Confirmation: positive ETCO2 and breath sounds checked- equal and bilateral Tube secured with: Tape Dental Injury: Teeth and Oropharynx as per pre-operative assessment

## 2014-10-30 NOTE — Anesthesia Postprocedure Evaluation (Signed)
  Anesthesia Post-op Note  Patient: Marissa Brooks  Procedure(s) Performed: Procedure(s): RIGHT MASTECTOMY WITH SENTINEL NODE MAPPING (Right)  Patient Location: PACU  Anesthesia Type:General  Level of Consciousness: awake, alert , oriented and patient cooperative  Airway and Oxygen Therapy: Patient Spontanous Breathing  Post-op Pain: mild  Post-op Assessment: Post-op Vital signs reviewed, Patient's Cardiovascular Status Stable, Respiratory Function Stable, Patent Airway, No signs of Nausea or vomiting and Pain level controlled              Post-op Vital Signs: Reviewed and stable  Last Vitals:  Filed Vitals:   10/30/14 1145  BP:   Pulse: 58  Temp:   Resp: 16    Complications: No apparent anesthesia complications

## 2014-10-30 NOTE — Anesthesia Preprocedure Evaluation (Addendum)
Anesthesia Evaluation  Patient identified by MRN, date of birth, ID band Patient awake    Reviewed: Allergy & Precautions, NPO status , Patient's Chart, lab work & pertinent test results  History of Anesthesia Complications Negative for: history of anesthetic complications  Airway Mallampati: II  TM Distance: >3 FB Neck ROM: Full    Dental  (+) Missing, Dental Advisory Given, Chipped   Pulmonary COPD COPD inhaler, Current Smoker,  breath sounds clear to auscultation        Cardiovascular hypertension, Pt. on medications - anginaRhythm:Regular Rate:Bradycardia     Neuro/Psych Anxiety Depression Bipolar Disorder negative neurological ROS     GI/Hepatic GERD-  Medicated and Controlled,(+)     substance abuse (last cocaine use 4d ago)  alcohol use, cocaine use and marijuana use, Hepatitis -, C  Endo/Other  Morbid obesity  Renal/GU negative Renal ROS     Musculoskeletal   Abdominal (+) + obese,   Peds  Hematology   Anesthesia Other Findings H/o breast cancer  Reproductive/Obstetrics                            Anesthesia Physical Anesthesia Plan  ASA: III  Anesthesia Plan: General   Post-op Pain Management:    Induction: Intravenous  Airway Management Planned: LMA  Additional Equipment:   Intra-op Plan:   Post-operative Plan:   Informed Consent: I have reviewed the patients History and Physical, chart, labs and discussed the procedure including the risks, benefits and alternatives for the proposed anesthesia with the patient or authorized representative who has indicated his/her understanding and acceptance.   Dental advisory given  Plan Discussed with: Surgeon and CRNA  Anesthesia Plan Comments: (Plan routine monitors, GA- LMA OK, pecs block for post op analgesia)        Anesthesia Quick Evaluation

## 2014-10-30 NOTE — Transfer of Care (Signed)
Immediate Anesthesia Transfer of Care Note  Patient: Marissa Brooks  Procedure(s) Performed: Procedure(s): RIGHT MASTECTOMY WITH SENTINEL NODE MAPPING (Right)  Patient Location: PACU  Anesthesia Type:General  Level of Consciousness: awake, alert , oriented and patient cooperative  Airway & Oxygen Therapy: Patient Spontanous Breathing and Patient connected to nasal cannula oxygen  Post-op Assessment: Report given to RN, Post -op Vital signs reviewed and stable and Patient moving all extremities  Post vital signs: Reviewed and stable  Last Vitals:  Filed Vitals:   10/30/14 0845  BP: 140/45  Pulse: 51  Temp:   Resp: 28    Complications: No apparent anesthesia complications

## 2014-10-30 NOTE — Progress Notes (Addendum)
Pt stated that the last time she used cocaine was 5/23.   DA Spoke with Dr. Glennon Mac regarding patients' allergy to Centrum (hives) and giving patient LR.  She said to go ahead and hang the bag.  DA

## 2014-10-30 NOTE — H&P (Signed)
Marissa Brooks 09/01/2014 8:48 AM Location: [facility] Patient #: [id number] DOB: March 12, 1956 Widowed / Language: [name] / Race: Black or African American Female  History of Present Illness Marissa Brooks. Marissa Starks MD; 09/01/2014 9:17 AM) Patient words: evakl breast ca.  The patient is a 59 year old female who presents with breast cancer. We are asked to see the patient in consultation by Dr. Lovey Brooks to evaluate her for a right-sided breast cancer. The patient is a 59 year old black female who recently went for a routine screening mammogram. At that time she was found to have a 9 mm area of distortion in the upper portion of the right breast. 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 She was ER and PR positive and HER-2 negative with a Ki-67 of 19%. She does have a history of left-sided breast cancer that was treated with mastectomy and reconstruction in 2005.   Other Problems Marissa Brooks, Marissa Brooks; 09/01/2014 8:48 AM) Alcohol Abuse Anxiety Disorder Back Pain Breast Cancer Depression Gastroesophageal Reflux Disease General anesthesia - complications Hemorrhoids  Past Surgical History Marissa Brooks, Marissa Brooks; 09/01/2014 8:48 AM) Breast Mass; Local Excision Right. Breast Reconstruction Left. Mastectomy Left.  Diagnostic Studies History Marissa Brooks, Marissa Brooks; 09/01/2014 8:48 AM) Colonoscopy never Mammogram within last year  Allergies Marissa Brooks, Marissa Brooks; 09/01/2014 8:49 AM) Centrum *MULTIVITAMINS*  Medication History (Marissa Brooks, Marissa Brooks; 09/01/2014 8:52 AM) Albuterol Sulfate (4MG Tablet, Oral) Active. Abilify (5MG Tablet, Oral) Active. Sertraline HCl (100MG Tablet, Oral) Active. TraZODone HCl (100MG Tablet, Oral) Active. Medications Reconciled  Social History Marissa Brooks, Marissa Brooks; 09/01/2014 8:48 AM) Alcohol use Moderate alcohol use. Caffeine use Coffee, Tea. Illicit drug use Uses socially only. Tobacco use Current every day smoker.  Family History  Marissa Brooks, Marissa Brooks; 09/01/2014 8:48 AM) Alcohol Abuse Father. Bleeding disorder Sister. Depression Family Members In General.  Pregnancy / Birth History Marissa Brooks, [place]; 09/01/2014 8:48 AM) Age at menarche 46 years. Gravida 0  Review of Systems (Marissa Brooks; 09/01/2014 8:48 AM) General Present- Fatigue and Weight Gain. Not Present- Appetite Loss, Chills, Fever, Night Sweats and Weight Loss. Skin Not Present- Change in Wart/Mole, Dryness, Hives, Jaundice, New Lesions, Non-Healing Wounds, Rash and Ulcer. HEENT Present- Hearing Loss, Ringing in the Ears and Wears glasses/contact lenses. Not Present- Earache, Hoarseness, Nose Bleed, Oral Ulcers, Seasonal Allergies, Sinus Pain, Sore Throat, Visual Disturbances and Yellow Eyes. Respiratory Present- Difficulty Breathing and Snoring. Not Present- Bloody sputum, Chronic Cough and Wheezing. Breast Present- Breast Pain. Not Present- Breast Mass, Nipple Discharge and Skin Changes. Cardiovascular Present- Shortness of Breath. Not Present- Chest Pain, Difficulty Breathing Lying Down, Leg Cramps, Palpitations, Rapid Heart Rate and Swelling of Extremities. Gastrointestinal Present- Constipation and Hemorrhoids. Not Present- Abdominal Pain, Bloating, Bloody Stool, Change in Bowel Habits, Chronic diarrhea, Difficulty Swallowing, Excessive gas, Gets full quickly at meals, Indigestion, Nausea, Rectal Pain and Vomiting. Musculoskeletal Present- Back Pain, Joint Pain and Joint Stiffness. Not Present- Muscle Pain, Muscle Weakness and Swelling of Extremities. Neurological Present- Trouble walking. Not Present- Decreased Memory, Fainting, Headaches, Numbness, Seizures, Tingling, Tremor and Weakness. Psychiatric Present- Anxiety, Bipolar, Change in Sleep Pattern and Depression. Not Present- Fearful and Frequent crying. Endocrine Present- Excessive Hunger and Hot flashes. Not Present- Cold Intolerance, Hair Changes, Heat Intolerance and New Diabetes. Hematology  Present- Easy Bruising. Not Present- Excessive bleeding, Gland problems, HIV and Persistent Infections.   Vitals (Marissa Brooks Marissa Brooks; 09/01/2014 8:49 AM) 09/01/2014 8:49 AM Weight: 138 lb Height: 64in Body Surface Area: 1.68 m Body Mass Index: 23.69  kg/m Temp.: 97.95F(Temporal)  Pulse: 79 (Regular)  BP: 138/79 (Sitting, Left Arm, Standard)    Physical Exam Marissa Dibbles S. Marissa Starks MD; 09/01/2014 9:18 AM) General Mental Status-Alert. General Appearance-Consistent with stated age. Hydration-Well hydrated. Voice-Normal.  Head and Neck Head-normocephalic, atraumatic with no lesions or palpable masses. Trachea-midline. Thyroid Gland Characteristics - normal size and consistency.  Eye Eyeball - Bilateral-Extraocular movements intact. Sclera/Conjunctiva - Bilateral-No scleral icterus.  Chest and Lung Exam Chest and lung exam reveals -quiet, even and easy respiratory effort with no use of accessory muscles and on auscultation, normal breath sounds, no adventitious sounds and normal vocal resonance. Inspection Chest Wall - Normal. Back - normal.  Breast Note: There is no palpable mass in the left reconstructed breast. There is no palpable mass of the right breast. There is no palpable axillary, supraclavicular, or cervical lymphadenopathy   Cardiovascular Cardiovascular examination reveals -normal heart sounds, regular rate and rhythm with no murmurs and normal pedal pulses bilaterally.  Abdomen Inspection Inspection of the abdomen reveals - No Hernias. Skin - Scar - no surgical scars. Palpation/Percussion Palpation and Percussion of the abdomen reveal - Soft, Non Tender, No Rebound tenderness, No Rigidity (guarding) and No hepatosplenomegaly. Auscultation Auscultation of the abdomen reveals - Bowel sounds normal.  Neurologic Neurologic evaluation reveals -alert and oriented x 3 with no impairment of recent or remote memory. Mental  Status-Normal.  Musculoskeletal Normal Exam - Left-Upper Extremity Strength Normal and Lower Extremity Strength Normal. Normal Exam - Right-Upper Extremity Strength Normal and Lower Extremity Strength Normal.  Lymphatic Head & Neck  General Head & Neck Lymphatics: Bilateral - Description - Normal. Axillary  General Axillary Region: Bilateral - Description - Normal. Tenderness - Non Tender. Femoral & Inguinal  Generalized Femoral & Inguinal Lymphatics: Bilateral - Description - Normal. Tenderness - Non Tender.    Assessment & Plan Marissa Dibbles S. Marissa Starks MD; 09/01/2014 9:16 AM) PRIMARY CANCER OF UPPER OUTER QUADRANT OF RIGHT FEMALE BREAST (174.4  C50.411) Impression: The patient has a newly diagnosed early stage cancer in the upper portion of the right breast. Because of her history of left-sided breast cancer she favors mastectomy with reconstruction and sentinel node mapping. I have discussed with her in detail the different options for treatment as well as the risks and benefits of surgery as well as some of the technical aspects and she understands and wishes to proceed Current Plans  Referred to Oncology, for evaluation and follow up (Oncology). Referred to Surgery ? Plastic, for evaluation and follow up (Plastic Surgery).   Signed by Luella Cook, MD (09/01/2014 9:19 AM)

## 2014-10-30 NOTE — Op Note (Signed)
10/30/2014  10:35 AM  PATIENT:  Marissa Brooks  59 y.o. female  PRE-OPERATIVE DIAGNOSIS:  Right Breast Cancer  POST-OPERATIVE DIAGNOSIS:  Right Breast Cancer  PROCEDURE:  Procedure(s): RIGHT MASTECTOMY WITH SENTINEL NODE MAPPING (Right)  Right axillary lymph node dissection  SURGEON:  Surgeon(s) and Role:    * Jovita Kussmaul, MD - Primary  PHYSICIAN ASSISTANT:   ASSISTANTS: none   ANESTHESIA:   general  EBL:  Total I/O In: 1000 [I.V.:1000] Out: 50 [Blood:50]  BLOOD ADMINISTERED:none  DRAINS: (2) Jackson-Pratt drain(s) with closed bulb suction in the prepectoral space   LOCAL MEDICATIONS USED:  NONE  SPECIMEN:  Source of Specimen:  right mastectomy and axillary contents  DISPOSITION OF SPECIMEN:  PATHOLOGY  COUNTS:  YES  TOURNIQUET:  * No tourniquets in log *  DICTATION: .Dragon Dictation  After informed consent was obtained the patient was brought to the operating room and placed in the supine position on the operating room table. After adequate induction of general anesthesia the patient's right chest, breast, and axillary area were prepped with ChloraPrep, allowed to dry, and draped in usual sterile manner. Earlier in the day the patient underwent injection of 1 mCi of technetium sulfur colloid in the subareolar position on the right. At this point an elliptical incision was made with a 10 blade knife around the nipple and areola complex in order to minimize the excess skin. The incision was carried through the skin and subcutaneous tissue sharply with the plasma blade. Breast hooks were used to elevate the skin flaps anteriorly towards the ceiling. Thin skin flaps were created by dissecting between the breast tissue and the subcutaneous fat with the plasma blade. This dissection was done circumferentially all the way to the chest wall. Laterally once the dissection reached the axilla of the neoprobe was used to identify a hot lymph node. This was excised sharply with the  plasma blade and sent to pathology for touch preps. Touch preps on this node were positive. Next the breast was removed from the pectoralis muscle with the pectoralis fascia in a top-down technique. Laterally the serratus muscle medially, the latissimus muscle laterally, and the axillary vein superiorly were identified. The contents of the axilla and between these landmarks was dissected out by blunt right angle dissection. Several small vessels were controlled with clips. The long thoracic and thoracodorsal nerves were identified and spared. Once the tissue was all removed and the specimen was sent to pathology as right mastectomy and axillary contents. Hemostasis was achieved using the Bovie electrocautery. The wound was irrigated with copious amounts of saline. 2 small stab incisions were made near the anterior axillary line inferior to the operative area. A tonsil clamp was placed through both of these openings and used to bring 19 [place] round Blake drains into the operative bed. The lateral drain was placed in the axilla and the medial drain was curled along the chest wall. Both drains were anchored to the skin with 3-0 nylon stitches. The superior and inferior flaps were then grossly reapproximated with interrupted 3-0 Vicryl stitches. The skin was then closed with staples. Sterile dressings were applied. The drains were placed to bulb suction and there was a good seal. The patient tolerated the procedure well. At the end of the case all needle sponge and instrument counts were correct. The patient was then awakened and taken to recovery in stable condition.  PLAN OF CARE: Admit for overnight observation  PATIENT DISPOSITION:  PACU - hemodynamically stable.  Delay start of Pharmacological VTE agent (>24hrs) due to surgical blood loss or risk of bleeding: no

## 2014-10-30 NOTE — Interval H&P Note (Signed)
History and Physical Interval Note:  10/30/2014 7:22 AM  Marissa Brooks  has presented today for surgery, with the diagnosis of Right Breast Cancer  The various methods of treatment have been discussed with the patient and family. After consideration of risks, benefits and other options for treatment, the patient has consented to  Procedure(s): [place] (Right) as a surgical intervention .  The patient's history has been reviewed, patient examined, no change in status, stable for surgery.  I have reviewed the patient's chart and labs.  Questions were answered to the patient's satisfaction.     TOTH III,Annamary Buschman S

## 2014-10-31 ENCOUNTER — Encounter (HOSPITAL_COMMUNITY): Payer: Self-pay | Admitting: General Surgery

## 2014-10-31 DIAGNOSIS — C50411 Malignant neoplasm of upper-outer quadrant of right female breast: Secondary | ICD-10-CM | POA: Diagnosis not present

## 2014-10-31 MED ORDER — OXYCODONE HCL 5 MG PO TABS: 5.0000 mg | ORAL_TABLET | ORAL | Status: DC | PRN

## 2014-10-31 NOTE — Progress Notes (Signed)
1 Day Post-Op  Subjective: Sore in right upper outer chest.  Objective: Vital signs in last 24 hours: Temp:  [97.9 F (36.6 C)-98.6 F (37 C)] 98.1 F (36.7 C) (06/10 0610) Pulse Rate:  [50-98] 50 (06/10 0610) Resp:  [14-17] 17 (06/10 0610) BP: (94-143)/(52-69) 113/52 mmHg (06/10 0610) SpO2:  [96 %-99 %] 99 % (06/10 0610) Weight:  [86.183 kg (190 lb)] 86.183 kg (190 lb) (06/09 1203) Last BM Date: 10/29/14  Intake/Output from previous day: 06/09 0701 - 06/10 0700 In: 5009 [P.O.:1180; I.V.:2709] Out: 290 [Drains:240; Blood:50] Intake/Output this shift: Total I/O In: 240 [P.O.:240] Out: -   PE: General- In NAD Right chest dressing dry, no significant flap swelling, thin serosanguinous drain output  Lab Results:  No results for input(s): WBC, HGB, HCT, PLT in the last 72 hours. BMET No results for input(s): NA, K, CL, CO2, GLUCOSE, BUN, CREATININE, CALCIUM in the last 72 hours. PT/INR No results for input(s): LABPROT, INR in the last 72 hours. Comprehensive Metabolic Panel:    Component Value Date/Time   NA 138 10/21/2014 1413   NA 141 07/08/2013 1515   K 4.4 10/21/2014 1413   K 5.0 07/08/2013 1515   CL 105 10/21/2014 1413   CL 105 07/08/2013 1515   CO2 25 10/21/2014 1413   CO2 24 07/08/2013 1515   BUN 12 10/21/2014 1413   BUN 17 07/08/2013 1515   CREATININE 1.03* 10/21/2014 1413   CREATININE 1.12* 07/08/2013 1515   GLUCOSE 94 10/21/2014 1413   GLUCOSE 112* 07/08/2013 1515   CALCIUM 9.5 10/21/2014 1413   CALCIUM 9.8 07/08/2013 1515   AST 38 10/21/2014 1413   AST 27 07/08/2013 1515   ALT 44 10/21/2014 1413   ALT 28 07/08/2013 1515   ALKPHOS 65 10/21/2014 1413   ALKPHOS 87 07/08/2013 1515   BILITOT 0.6 10/21/2014 1413   BILITOT 0.3 07/08/2013 1515   PROT 7.6 10/21/2014 1413   PROT 7.8 07/08/2013 1515   ALBUMIN 3.9 10/21/2014 1413   ALBUMIN 3.8 07/08/2013 1515     Studies/Results: Nm Sentinel Node Inj-no Rpt (breast)  10/30/2014   CLINICAL DATA: right  breast cancer   Sulfur colloid was injected intradermally by the nuclear medicine  technologist for breast cancer sentinel node localization.     Anti-infectives: Anti-infectives    Start     Dose/Rate Route Frequency Ordered Stop   10/30/14 0815  ceFAZolin (ANCEF) IVPB 2 g/50 mL premix     2 g 100 mL/hr over 30 Minutes Intravenous To Surgery 10/29/14 1418 10/30/14 0906      Assessment Principal Problem:   Breast cancer of upper-outer quadrant of right female breast s/p Right MRM 10/30/14-stable overnight.      Plan: Discharge today.   Jabarie Pop J 10/31/2014

## 2014-10-31 NOTE — Discharge Instructions (Signed)
[facility] [phone]  MASTECTOMY: POST OP INSTRUCTIONS  Always review your discharge instruction sheet given to you by the facility where your surgery was performed. IF YOU HAVE DISABILITY OR FAMILY LEAVE FORMS, YOU MUST BRING THEM TO THE OFFICE FOR PROCESSING.   DO NOT GIVE THEM TO YOUR DOCTOR. A prescription for pain medication may be given to you upon discharge.  Take your pain medication as prescribed, if needed.  If narcotic pain medicine is not needed, then you may take acetaminophen (Tylenol) or ibuprofen (Advil) as needed. 1. Take your usually prescribed medications unless otherwise directed. 2. If you need a refill on your pain medication, please contact your pharmacy.  They will contact our office to request authorization.  Prescriptions will not be filled after 5pm or on week-ends. 3. You should follow a light diet the first few days after arrival home, such as soup and crackers, etc.  Resume your normal diet the day after surgery. 4. Most patients will experience some swelling and bruising on the chest and underarm.  Ice packs will help.  Swelling and bruising can take several days to resolve.  5. It is common to experience some constipation if taking pain medication after surgery.  Increasing fluid intake and taking a stool softener (such as Colace) will usually help or prevent this problem from occurring.  A mild laxative (Milk of Magnesia or Miralax) should be taken according to package instructions if there are no bowel movements after 48 hours. 6. Unless discharge instructions indicate otherwise, leave your bandage dry and in place until your next appointment in 3-5 days.  You may take a limited sponge bath.  No tube baths or showers until the drains are removed.  You may have steri-strips (small skin tapes) in place directly over the incision.  These strips should be left on the skin for 7-10 days.  If your surgeon used skin glue on the incision, you may  shower in 24 hours.  The glue will flake off over the next 2-3 weeks.  Any sutures or staples will be removed at the office during your follow-up visit. 7. DRAINS:  If you have drains in place, it is important to keep a list of the amount of drainage produced each day in your drains.  Before leaving the hospital, you should be instructed on drain care.  Call our office if you have any questions about your drains. 8. ACTIVITIES:  You may resume regular (light) daily activities beginning the next day--such as daily self-care, walking, climbing stairs--gradually increasing activities as tolerated.  You may have sexual intercourse when it is comfortable.  Refrain from any heavy lifting or straining until approved by your doctor. a. You may drive when you are no longer taking prescription pain medication, you can comfortably wear a seatbelt, and you can safely maneuver your car and apply brakes. b. RETURN TO WORK:  __________________________________________________________ 9. You should see your doctor in the office for a follow-up Tuesday, June 14 at 12:00. 10.  11. OTHER INSTRUCTIONS: ____Empty drains and record output twice a day.__________________________________________________________________________________________ ____________________________________________________________________________________________ WHEN TO CALL YOUR DOCTOR: 1. Fever over 101.0 2. Nausea and/or vomiting 3. Extreme swelling or bruising 4. Continued bleeding from incision. 5. Increased pain, redness, or drainage from the incision. The clinic staff is available to answer your questions during regular business hours.  Please dont hesitate to call and ask to speak to one of the nurses for clinical concerns.  If you have a medical emergency, go to  the nearest emergency room or call 911.  A surgeon from [facility] is always on call at the hospital. [address] [place], [place], [place]  [address] ? [address], [place], [place]   [address] [phone] ? [phone] ? FAX [phone] Web site: [email or web address]

## 2014-10-31 NOTE — Progress Notes (Signed)
Discharge home. Home discharge instruction given, no questions verbalized. 

## 2014-10-31 NOTE — Discharge Summary (Signed)
Physician Discharge Summary  Patient ID: Marissa Brooks MRN: [id number] DOB/AGE: [birth year] 59 y.o.  Admit date: 10/30/2014 Discharge date: 10/31/2014  Admission Diagnoses:  Right Breast cancer  Discharge Diagnoses:  Principal Problem:   Breast cancer of upper-outer quadrant of right female breast s/p Right MRM 10/30/14   Discharged Condition: good  Hospital Course: She underwent right MRM on 10/30/14 and tolerated it well.  She was able to be discharged on POD #1.  Discharge instructions were given to her.  She will follow up with Dr. Marlou Starks in the office on 11/04/14 at 12:0  Discharge Exam: Blood pressure 113/52, pulse 50, temperature 98.1 F (36.7 C), temperature source Oral, resp. rate 17, height 5\' 2"  (1.575 m), weight 86.183 kg (190 lb), SpO2 99 %.   Disposition: 01-Home or Self Care     Medication List    STOP taking these medications        albuterol 108 (90 BASE) MCG/ACT inhaler  Commonly known as:  PROVENTIL HFA;VENTOLIN HFA      TAKE these medications        ARIPiprazole 5 MG tablet  Commonly known as:  ABILIFY  Take 1 tablet (5 mg total) by mouth daily. For mood control     lisinopril 10 MG tablet  Commonly known as:  PRINIVIL,ZESTRIL  Take 10 mg by mouth daily.     naproxen 500 MG tablet  Commonly known as:  NAPROSYN  Take 500 mg by mouth 2 (two) times daily with a meal.     oxyCODONE 5 MG immediate release tablet  Commonly known as:  Oxy IR/ROXICODONE  Take 1-2 tablets (5-10 mg total) by mouth every 4 (four) hours as needed for moderate pain, severe pain or breakthrough pain.     sertraline 100 MG tablet  Commonly known as:  ZOLOFT  Take 1 tablet (100 mg total) by mouth at bedtime. For depression     traZODone 100 MG tablet  Commonly known as:  DESYREL  Take 1 tablet (100 mg total) by mouth at bedtime. For sleep         Signed: Oniyah Rohe J 10/31/2014, 9:39 AM

## 2014-10-31 NOTE — Progress Notes (Signed)
OPIB; no review required.

## 2014-11-21 ENCOUNTER — Telehealth: Payer: Self-pay | Admitting: *Deleted

## 2014-11-21 NOTE — Telephone Encounter (Signed)
Received a call from [facility], Dr. Ethlyn Gallery nurse stating she would like to get a new pt appt and hand deliver it to her during her postop appt with Dr. Marlou Starks. Scheduled and gave Sharyn Lull the appt for Dr. Burr Medico 12/08/14 at 2:30pm. Sharyn Lull will give the pt our contact information and appt date and time.

## 2014-12-02 ENCOUNTER — Telehealth: Payer: Self-pay | Admitting: *Deleted

## 2014-12-02 NOTE — Telephone Encounter (Signed)
Called Tiffany for help w/ PCP and she emailed me [facility] and NPI # [id number] for 3 visits.  Updated referral.

## 2014-12-02 NOTE — Telephone Encounter (Signed)
Called and spoke with Larene Beach at Pt's PCP and she states that she does not have this pt listed as a patient of theirs.  I confirmed PCP w/ pt.  Called Rhys Martini on direction of what to do for a referral auth.

## 2014-12-07 NOTE — Progress Notes (Addendum)
[place]  Telephone:([phone] Fax:([phone]) [place] Note   Patient Care Team: Elwyn Reach, MD as PCP - General (Internal Medicine) Autumn Messing III, MD as Consulting Physician (General Surgery) 12/08/2014  CHIEF COMPLAINTS/PURPOSE OF CONSULTATION:  Evaluation for adjuvant chemotherapy  Oncology History   Breast cancer of upper-outer quadrant of right female breast s/p Right MRM 10/30/14   Staging form: Breast, AJCC 7th Edition     Pathologic: Stage IIA (T1b, N1a, cM0) - Unsigned       Breast cancer of upper-outer quadrant of right female breast s/p Right MRM 10/30/14   06/03/2004 Cancer Diagnosis Left breast DCIS, status post mastectomy   06/06/2014 Imaging A mammogram and ultrasound showed a irregular 0.7 cm mass at the 12:30 clock position of right breast. Axillary nodes were negative.   08/12/2014 Receptors her2 ER 100% positive, PR 88% positive, HER-2 negative. Ki-67 19%.   08/12/2014 Initial Biopsy Right breast 12:30 o'clock biopsy showed invasive ductal carcinoma, grade 2.   10/28/2014 Surgery Right breast lumpectomy, past showed intraductal papilloma no malignancy or atypia.    10/30/2014 Initial Diagnosis Breast cancer of upper-outer quadrant of right female breast s/p Right MRM 10/30/14   10/30/2014 Pathology Results Right breast radical mastectomy showed invasive ductal carcinoma, grade 1, tumor measuring 1.0 cm, margins were negative lymphovascular invasion (+), 1 sentinel lymph nodes positive, 15 additional axillary nodes negative.  (+) DCIS      HISTORY OF PRESENTING ILLNESS:  Marissa Brooks 59 y.o. female is here because of recently diagnosed right breast cancer.   This was discovered by screening mammogram in January 2016. She did not have palpable breast mass or any other symptoms. Biopsy on 08/12/2014 showed invasive ductal carcinoma, ER/PR positive, HER-2 negative. She underwent right breast mastectomy by Dr. Marcello Moores on 10/28/2014. Marland Kitchen    She has had some pain underneath her R armpit with some swelling. She reports that her "drain fell out" and was seen by Dr. Marlou Starks on 7/15 at which time a seroma was drained. Otherwise, she denies any problems with appetite, energy, pain. She does report having lost weight 195 lbs to 186 lbs though over an unspecified time interval. She is now menopausal with last menstrual period being sometime in her late 42s.  Per records obtained from [facility], she underwent left simple mastectomy on at [facility] on 07/16/04 after detection of multiple abnormalities in the left breast on mammography and was diagnosed with DCIS. On 05/27/05, she underwent placement of left tissue expander but declined additional reconstructive surgery. On 06/15/06, she was seen by Dr. Sophronia Simas for possible adjuvant tamoxifen and was recommended to undergo close surveillance. 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   She follows with Dr. Gala Romney for PCP care and was last seen by them prior to the surgery. She is currently undergoing substance abuse rehab. She reports that 1 pack of cigarettes lasts her a week and has not used cocaine for a month ago. She does report using heroin in the past. She also has history of bipolar for which she is on Abilify. She is widowed, has no children. She lives with a roommate who she has known for the last 10 years.  MEDICAL HISTORY:  Past Medical History  Diagnosis Date  . Hypertension   . Shortness of breath dyspnea     with walking  . Pneumonia   . Anxiety   . Depression   .  Bipolar disorder   . GERD (gastroesophageal reflux disease)   . Arthritis   . Cancer 2002    left breast cancer tx in [place], right breast cancer 08/2014  . ETOH abuse     as of 10/21/14 none for a week  . Cocaine abuse     last use 10/13/14    SURGICAL HISTORY: Past Surgical History  Procedure Laterality Date  . Mastectomy      and  Implant  . Mandible fracture surgery    . Breast surgery  2002    left mastectomy with implant  . Tonsillectomy    . Mastectomy w/ sentinel node biopsy Right 10/30/2014  . Simple mastectomy with axillary sentinel node biopsy Right 10/30/2014    Procedure: RIGHT MASTECTOMY WITH SENTINEL NODE MAPPING;  Surgeon: Autumn Messing III, MD;  Location: [place];  Service: General;  Laterality: Right;    SOCIAL HISTORY: History   Social History  . Marital Status: Widowed    Spouse Name: N/A  . Number of Children: N/A  . Years of Education: N/A   Occupational History  . Not on file.   Social History Main Topics  . Smoking status: Current Every Day Smoker -- 0.25 packs/day    Types: Cigarettes  . Smokeless tobacco: Never Used  . Alcohol Use: Yes     Comment: was a heavy drinker (daily) none for a week (as of 10/21/14.  . Drug Use: Yes    Special: Cocaine, Marijuana     Comment: last use of cocaine ?10/13/14, last use of marijuana 3 weeks ago (as of 10/21/14)  . Sexual Activity: Not Currently    Birth Control/ Protection: Abstinence   Other Topics Concern  . Not on file   Social History Narrative    FAMILY HISTORY: Family History  Problem Relation Age of Onset  . Breast cancer Sister 52  . Mental illness Mother   . HIV/AIDS Sister     Deceased    ALLERGIES:  is allergic to centrum.  MEDICATIONS:  Current Outpatient Prescriptions  Medication Sig Dispense Refill  . ARIPiprazole (ABILIFY) 5 MG tablet Take 1 tablet (5 mg total) by mouth daily. For mood control 30 tablet 0  . lisinopril (PRINIVIL,ZESTRIL) 10 MG tablet Take 10 mg by mouth daily.    Marland Kitchen oxyCODONE (OXY IR/ROXICODONE) 5 MG immediate release tablet Take 1-2 tablets (5-10 mg total) by mouth every 4 (four) hours as needed for moderate pain, severe pain or breakthrough pain. 40 tablet 0  . sertraline (ZOLOFT) 100 MG tablet Take 1 tablet (100 mg total) by mouth at bedtime. For depression 30 tablet 0  . traZODone (DESYREL) 100 MG  tablet Take 1 tablet (100 mg total) by mouth at bedtime. For sleep (Patient taking differently: Take 100 mg by mouth at bedtime as needed. For sleep) 30 tablet 0   No current facility-administered medications for this visit.    REVIEW OF SYSTEMS:   Constitutional: Denies fevers, chills or abnormal night sweats Eyes: Denies blurriness of vision, double vision or watery eyes Ears, nose, mouth, throat, and face: Denies mucositis or sore throat Respiratory: Denies cough, dyspnea or wheezes Cardiovascular: Denies palpitation, chest discomfort or lower extremity swelling Gastrointestinal:  Denies nausea, heartburn or change in bowel habits Skin: Denies abnormal skin rashes Lymphatics: Denies new lymphadenopathy or easy bruising Neurological:Denies numbness, tingling or new weaknesses Behavioral/Psych: Mood is stable, no new changes  All other systems were reviewed with the patient and are negative.  PHYSICAL EXAMINATION: ECOG PERFORMANCE  STATUS: 0 - Asymptomatic  Filed Vitals:   12/08/14 1516  BP: 117/83  Pulse: 85  Temp: 98.3 F (36.8 C)  Resp: 17   Filed Weights   12/08/14 1516  Weight: 189 lb (85.73 kg)    GENERAL:alert, no distress and comfortable SKIN: skin color, texture, turgor are normal, no rashes or significant lesions EYES: normal, conjunctiva are pink and non-injected, sclera clear OROPHARYNX:no exudate, no erythema and lips, buccal mucosa, and tongue normal  NECK: supple, thyroid normal size, non-tender, without nodularity LYMPH:  no palpable lymphadenopathy in the cervical, axillary or inguinal BREAST: s/p bilateral mastectomy, marked swelling noted just inferior the right axilla extending across to the front, surgical scars noted consistent with prior procedures. Exam of bilateral axilla revealed no palpable mass or adenopathy. LUNGS: clear to auscultation and percussion with normal breathing effort HEART: regular rate & rhythm and no murmurs and no lower extremity  edema ABDOMEN:abdomen soft, non-tender and normal bowel sounds Musculoskeletal:no cyanosis of digits and no clubbing  PSYCH: alert & oriented x 3, some degree of cognitive impairment noted throughout the interview NEURO: no focal motor/sensory deficits  LABORATORY DATA:  I have reviewed the data as listed CBC Latest Ref Rng 10/21/2014 07/08/2013 10/21/2010  WBC 4.0 - 10.5 K/uL 6.9 7.9 6.4  Hemoglobin 12.0 - 15.0 g/dL 13.5 12.7 12.6  Hematocrit 36.0 - 46.0 % 39.4 38.3 37.4  Platelets 150 - 400 K/uL 174 179 136(L)    CMP Latest Ref Rng 10/21/2014 07/08/2013 10/21/2010  Glucose 65 - 99 mg/dL 94 112(H) 93  BUN 6 - 20 mg/dL 12 17 11   Creatinine 0.44 - 1.00 mg/dL 1.03(H) 1.12(H) 0.67  Sodium 135 - 145 mmol/L 138 141 140  Potassium 3.5 - 5.1 mmol/L 4.4 5.0 3.8  Chloride 101 - 111 mmol/L 105 105 103  CO2 22 - 32 mmol/L 25 24 28   Calcium 8.9 - 10.3 mg/dL 9.5 9.8 10.0  Total Protein 6.5 - 8.1 g/dL 7.6 7.8 7.5  Total Bilirubin 0.3 - 1.2 mg/dL 0.6 0.3 0.2(L)  Alkaline Phos 38 - 126 U/L 65 87 80  AST 15 - 41 U/L 38 27 21  ALT 14 - 54 U/L 44 28 22    PATHOLOGY REPORT 10/30/2014 ADDITIONAL INFORMATION: 2. FLUORESCENCE IN-SITU HYBRIDIZATION  Results: HER2 - NEGATIVE RATIO OF HER2/CEP17 SIGNALS 1.62 AVERAGE HER2 COPY NUMBER PER CELL 2.10 Reference Range: NEGATIVE HER2/CEP17 Ratio <2.0 and average HER2 copy number <4.0 EQUIVOCAL HER2/CEP17 Ratio <2.0 and average HER2 copy number 4.0 and <6.0 POSITIVE HER2/CEP17 Ratio >=2.0 or <2.0 and average HER2 copy number >=6.0 Mali RUND DO Pathologist, Electronic Signature ( Signed 11/26/2014) 2. her2 This is NOT signed out FINAL DIAGNOSIS Diagnosis 1. Lymph node, sentinel, biopsy, Right #1 - ONE LYMPH NODE, POSITIVE FOR METASTATIC MAMMARY CARCINOMA (1/1). - INTRANODAL TUMOR DEPOSIT IS 1.2 CM - POSITIVE FOR EXTRACAPSULAR TUMOR EXTENSION. 2. Breast, radical mastectomy (including lymph nodes), Right and axillary contents - INVASIVE DUCTAL CARCINOMA,  SEE COMMENT. - POSITIVE FOR LYMPH VASCULAR INVASION. - DUCTAL CARCINOMA IN SITU WITH NECROSIS AND CALCIFICATIONS. - FIFTEEN LYMPH NODE S, NEGATIVE FOR TUMOR (0/15). - PREVIOUS BIOPSY SITE. - SEE TUMOR SYNOPTIC TEMPLATE BELOW  Microscopic Comment 2. BREAST, INVASIVE TUMOR, WITH LYMPH NODES PRESENT Specimen, including laterality and lymph node sampling (sentinel, non-sentinel): Right breast with sentinel lymph node sampling Procedure: Radical mastectomy Histologic type: Ductal Grade: I of III Tubule formation: 2 Nuclear pleomorphism: 2 Mitotic: 1 Tumor size (gross measurement): 1.0 cm Margins: Invasive, distance to closest margin:  1.8 cm (posterior) In-situ, distance to closest margin: 1.8 cm (posterior) If margin positive, focally or broadly: N/A Lymphovascular invasion: Present Ductal carcinoma in situ: Present Grade: 2 of 3 Extensive intraductal component: Absent Lobular neoplasia: Absent Tumor focality: Unifocal Treatment effect: None If present, treatment effect in breast tissue, lymph nodes or both: N/A Extent of tumor: Skin: N/A Nipple: N/A Skeletal muscle: N/A Lymph nodes: Examined: 1 Sentinel 15 Non-sentinel 16 Total Lymph nodes with metastasis: 1 Isolated tumor cells (< 0.2 mm): 0 Micrometastasis: (> 0.2 mm and < 2.0 mm): 0 Macrometastasis: (> 2.0 mm): x 1 Extracapsular extension: Present Breast prognostic profile: Estrogen receptor: Not repeated, previous study demonstrated 100% positivity ([id number]) Progesterone receptor: Not repeated, previous study demonstrated 88% positivity ([id number]) Her 2 neu: Repeated, previous study demonstrated no amplification ([id number]) Ki-67: Not repeated, previous study demonstrated 19% proliferation rate ([id number]) Non-neoplastic breast: Previous biopsy site, fibrocystic change, columnar cell change, sclerosing adenosis, benign adenosis and calcifications TNM: p T1b, pN1a, pMX   RADIOGRAPHIC STUDIES: I have  personally reviewed the radiological images as listed and agreed with the findings in the report. No results found.  ASSESSMENT & PLAN:  Ms. Hegstrom is a 59 year old female with h/o left DCIS s/p mastectomy who presents with R invasive ductal carcinoma s/p mastectomy  1. Stage IIa, pT1bpN1a,M0, right invasive ductal carcinoma s/p mastectomy, ER+/PR+/HER- , G1 -Discussed with the patient the results of her surgical path and her cancer staging -The natural history of breast cancer were reviewed with her, and moderate risk of cancer recurrence after her surgery, giving the note positive disease. -Giving the note positive disease, I recommend adjuvant chemotherapy to reduce the risk of cancer recurrence. Also I would prefer to use dose dense Adriamycin, Cytoxan followed by weekly Taxol, I think this regimen would be difficult to tolerate and implement giving her multiple comorbidities, especially psychological issues, alcohol abuse, and limited social support.  -Alternatively 4 cycles of IV docetaxel and Cytoxan, every 3 weeks will be an reasonable adjuvant chemotherapy treatment plan, and I think she probably can tolerate it. --Chemotherapy consent: Side effects including but does not not limited to, fatigue, nausea, vomiting, diarrhea, hair loss, neuropathy, fluid retention, renal and kidney dysfunction, neutropenic fever, needed for blood transfusion, bleeding, were discussed with patient in great detail. She agrees to proceed. -I will also refer her to radiation oncology to consider adjuvant irradiation -Giving the strong ER/PR positivity of her tumor, I would recommend adjuvant aromatase inhibitor to reduce her risk of cancer recurrence after she completes radiation.  2. Genetics -Giving her asthma history of breast cancer twice, and family history (sister) of breast cancer, I'll refer her to see a genetic counser -She does not have children, but does have sisters.  2. History of cirrhosis and HCV  infection -No imaging or additional workup found in our system though noted to have undergone treatment for HCV in 1999 at [facility] per note by Aurora Mask  [06/02/09] -Most recent LFTs reassuring -Check HCV labs in 2 weeks and contact PCP to find out what treatment she may have received  3. Polysubstance abuse: Recently hospitalized in February 2016 for substance-induced mood disorder. -Continue to encourage patient to continue with substance rehab and avoid illicit substances  4. Hypertension: Normotensive today.  -Defer to PCP  5. Mood disorder: She does not follow with a psychiatrist and reports her PCP prescribes all of her psychiatric medications.  -Defer to PCP  Plan -Lab today, CBC, CMP, hepatitis C antibody, tight and genotype -  Chemotherapy class -Return to clinic in 2 weeks to start first cycle of docetaxel and Cytoxan, if there is no surgical wound issue.  -I will not put a port at this point   Orders Placed This Encounter  Procedures  . Comprehensive metabolic panel (Cmet) - [facility]    Standing Status: Standing     Number of Occurrences: 10     Standing Expiration Date: 12/07/2017  . CBC & Diff and Retic    Standing Status: Standing     Number of Occurrences: 10     Standing Expiration Date: 12/07/2017  . Protime-INR    Standing Status: Future     Number of Occurrences:      Standing Expiration Date: 12/08/2015  . Hepatitis C antibody    Standing Status: Future     Number of Occurrences:      Standing Expiration Date: 12/08/2015  . HCV RNA quant rflx ultra or genotyp    Standing Status: Future     Number of Occurrences:      Standing Expiration Date: 12/08/2015    Charlott Rakes, PGY2 Internal Medicine Pager: [phone]  12/08/2014   I have seen the patient, examined her. I agree with the assessment and and plan and have edited the notes.   Truitt Merle  12/08/2014

## 2014-12-08 ENCOUNTER — Encounter: Payer: Self-pay | Admitting: Hematology

## 2014-12-08 ENCOUNTER — Ambulatory Visit (HOSPITAL_BASED_OUTPATIENT_CLINIC_OR_DEPARTMENT_OTHER): Payer: Medicare HMO | Admitting: Hematology

## 2014-12-08 ENCOUNTER — Ambulatory Visit: Payer: Medicare HMO

## 2014-12-08 VITALS — BP 117/83 | HR 85 | Temp 98.3°F | Resp 17 | Ht 62.0 in | Wt 189.0 lb

## 2014-12-08 DIAGNOSIS — F1910 Other psychoactive substance abuse, uncomplicated: Secondary | ICD-10-CM | POA: Diagnosis not present

## 2014-12-08 DIAGNOSIS — Z803 Family history of malignant neoplasm of breast: Secondary | ICD-10-CM

## 2014-12-08 DIAGNOSIS — F319 Bipolar disorder, unspecified: Secondary | ICD-10-CM | POA: Diagnosis not present

## 2014-12-08 DIAGNOSIS — I10 Essential (primary) hypertension: Secondary | ICD-10-CM | POA: Diagnosis not present

## 2014-12-08 DIAGNOSIS — Z8619 Personal history of other infectious and parasitic diseases: Secondary | ICD-10-CM

## 2014-12-08 DIAGNOSIS — Z853 Personal history of malignant neoplasm of breast: Secondary | ICD-10-CM

## 2014-12-08 DIAGNOSIS — Z87898 Personal history of other specified conditions: Secondary | ICD-10-CM

## 2014-12-08 DIAGNOSIS — C50211 Malignant neoplasm of upper-inner quadrant of right female breast: Secondary | ICD-10-CM | POA: Diagnosis not present

## 2014-12-08 DIAGNOSIS — Z720 Tobacco use: Secondary | ICD-10-CM

## 2014-12-08 DIAGNOSIS — C50411 Malignant neoplasm of upper-outer quadrant of right female breast: Secondary | ICD-10-CM

## 2014-12-08 MED ORDER — ONDANSETRON HCL 8 MG PO TABS: 8.0000 mg | ORAL_TABLET | Freq: Two times a day (BID) | ORAL | Status: DC

## 2014-12-08 NOTE — Progress Notes (Signed)
Checked in new pt with no financial concerns.  Pt has 2 insurances so financial assistance may not be needed.  ° °

## 2014-12-08 NOTE — Addendum Note (Signed)
Addended by: Truitt Merle on: 12/08/2014 11:40 PM   Modules accepted: Orders

## 2014-12-09 ENCOUNTER — Telehealth: Payer: Self-pay | Admitting: *Deleted

## 2014-12-09 NOTE — Telephone Encounter (Signed)
Per staff message and POF I have scheduled appts. Advised scheduler of appts. JMW  

## 2014-12-16 ENCOUNTER — Encounter: Payer: Self-pay | Admitting: *Deleted

## 2014-12-16 ENCOUNTER — Ambulatory Visit (HOSPITAL_BASED_OUTPATIENT_CLINIC_OR_DEPARTMENT_OTHER): Payer: Medicare HMO

## 2014-12-16 ENCOUNTER — Other Ambulatory Visit: Payer: Medicare HMO

## 2014-12-16 DIAGNOSIS — C50211 Malignant neoplasm of upper-inner quadrant of right female breast: Secondary | ICD-10-CM | POA: Diagnosis not present

## 2014-12-16 DIAGNOSIS — C50411 Malignant neoplasm of upper-outer quadrant of right female breast: Secondary | ICD-10-CM

## 2014-12-16 LAB — CBC & DIFF AND RETIC
BASO%: 0.3 % (ref 0.0–2.0)
BASOS ABS: 0 10*3/uL (ref 0.0–0.1)
EOS ABS: 0.2 10*3/uL (ref 0.0–0.5)
EOS%: 3.1 % (ref 0.0–7.0)
HEMATOCRIT: 37.5 % (ref 34.8–46.6)
HGB: 12.4 g/dL (ref 11.6–15.9)
Immature Retic Fract: 3.3 % (ref 1.60–10.00)
LYMPH%: 30.3 % (ref 14.0–49.7)
MCH: 28.8 pg (ref 25.1–34.0)
MCHC: 33.1 g/dL (ref 31.5–36.0)
MCV: 87.2 fL (ref 79.5–101.0)
MONO#: 0.3 10*3/uL (ref 0.1–0.9)
MONO%: 5.7 % (ref 0.0–14.0)
NEUT%: 60.6 % (ref 38.4–76.8)
NEUTROS ABS: 3.5 10*3/uL (ref 1.5–6.5)
Platelets: 169 10*3/uL (ref 145–400)
RBC: 4.3 10*6/uL (ref 3.70–5.45)
RDW: 14.1 % (ref 11.2–14.5)
RETIC %: 1.82 % (ref 0.70–2.10)
RETIC CT ABS: 78.26 10*3/uL (ref 33.70–90.70)
WBC: 5.7 10*3/uL (ref 3.9–10.3)
lymph#: 1.7 10*3/uL (ref 0.9–3.3)

## 2014-12-16 LAB — COMPREHENSIVE METABOLIC PANEL (CC13)
ALT: 33 U/L (ref 0–55)
AST: 31 U/L (ref 5–34)
Albumin: 3.7 g/dL (ref 3.5–5.0)
Alkaline Phosphatase: 74 U/L (ref 40–150)
Anion Gap: 7 mEq/L (ref 3–11)
BUN: 15.9 mg/dL (ref 7.0–26.0)
CO2: 25 mEq/L (ref 22–29)
Calcium: 9.7 mg/dL (ref 8.4–10.4)
Chloride: 108 mEq/L (ref 98–109)
Creatinine: 0.8 mg/dL (ref 0.6–1.1)
Glucose: 99 mg/dl (ref 70–140)
Potassium: 4.5 mEq/L (ref 3.5–5.1)
Sodium: 140 mEq/L (ref 136–145)
TOTAL PROTEIN: 7.4 g/dL (ref 6.4–8.3)
Total Bilirubin: 0.38 mg/dL (ref 0.20–1.20)

## 2014-12-16 LAB — PROTIME-INR
INR: 1 — AB (ref 2.00–3.50)
Protime: 12 Seconds (ref 10.6–13.4)

## 2014-12-18 ENCOUNTER — Telehealth: Payer: Self-pay

## 2014-12-18 NOTE — Telephone Encounter (Signed)
I called her back and left a message and request her to call back ASAP.   Marissa Brooks  12/18/2014

## 2014-12-18 NOTE — Telephone Encounter (Signed)
Pt called stating she did not want to get sick from the chemotherapy. "I went to chemo class and got this big notebook and I don't want all that". She was asking if there is an oral she can take, I explained there is no oral chemo for breast cancer that I am aware of. Even if she did take an oral medication it would still have side effects. I tried to reassure her we do all in our power to prevent SE. There is still a possibility of SE. She then asked if there was a second opinion out there. She asked for Dr Burr Medico to call her. Her chemo is scheduled to start Monday 8/1.

## 2014-12-19 ENCOUNTER — Other Ambulatory Visit: Payer: Self-pay | Admitting: Hematology

## 2014-12-19 ENCOUNTER — Telehealth: Payer: Self-pay | Admitting: Hematology

## 2014-12-19 LAB — HEPATITIS C ANTIBODY: HCV Ab: REACTIVE — AB

## 2014-12-19 LAB — HEPATITIS C RNA QUANTITATIVE
HCV QUANT LOG: 6.83 {Log} — AB (ref <1.18)
HCV QUANT: 6697689 [IU]/mL — AB (ref <15)

## 2014-12-19 LAB — HCV RNA QUANT RFLX ULTRA OR GENOTYP
HCV Quantitative Log: 6.83 {Log} — ABNORMAL HIGH (ref <1.18)
HCV Quantitative: 6697689 IU/mL — ABNORMAL HIGH (ref <15)

## 2014-12-19 LAB — HEPATITIS C GENOTYPE

## 2014-12-19 NOTE — Telephone Encounter (Signed)
Returned her call at 1pm, no answer. I left a message and ask her to call back this afternoon, and please connect her to my office or nurse station directly when she calls.  Thanks.  Truitt Merle  12/19/2014

## 2014-12-19 NOTE — Telephone Encounter (Signed)
Pt to be seen by MD in infusion room per MD at 8:15 pt is aware... KJ

## 2014-12-19 NOTE — Telephone Encounter (Signed)
VOICE MAIL FROM 10:18AM/ RETURN CALL AT 12:10PM PT. DID NOT RECEIVE DR.FENG MESSAGE YESTERDAY. SHE WAS ASLEEP. PT WILL BE AT THIS PHONE NUMBER, [phone].

## 2014-12-19 NOTE — Telephone Encounter (Signed)
I spoke with her today, and she will come in 8am on Monday if she can find a driver from [facility]. I will see her if she comes in.  Truitt Merle  12/19/2014

## 2014-12-22 ENCOUNTER — Encounter: Payer: Self-pay | Admitting: Hematology

## 2014-12-22 ENCOUNTER — Telehealth: Payer: Self-pay | Admitting: Hematology

## 2014-12-22 ENCOUNTER — Other Ambulatory Visit (HOSPITAL_BASED_OUTPATIENT_CLINIC_OR_DEPARTMENT_OTHER): Payer: Medicare HMO

## 2014-12-22 ENCOUNTER — Ambulatory Visit (HOSPITAL_BASED_OUTPATIENT_CLINIC_OR_DEPARTMENT_OTHER): Payer: Medicare HMO | Admitting: Hematology

## 2014-12-22 ENCOUNTER — Telehealth: Payer: Self-pay | Admitting: *Deleted

## 2014-12-22 ENCOUNTER — Ambulatory Visit: Payer: Medicare HMO

## 2014-12-22 VITALS — BP 163/67 | HR 70 | Temp 98.4°F | Resp 18 | Ht 62.0 in | Wt 184.9 lb

## 2014-12-22 DIAGNOSIS — F1910 Other psychoactive substance abuse, uncomplicated: Secondary | ICD-10-CM

## 2014-12-22 DIAGNOSIS — I10 Essential (primary) hypertension: Secondary | ICD-10-CM

## 2014-12-22 DIAGNOSIS — C50211 Malignant neoplasm of upper-inner quadrant of right female breast: Secondary | ICD-10-CM | POA: Diagnosis not present

## 2014-12-22 DIAGNOSIS — F319 Bipolar disorder, unspecified: Secondary | ICD-10-CM

## 2014-12-22 DIAGNOSIS — Z87898 Personal history of other specified conditions: Secondary | ICD-10-CM

## 2014-12-22 DIAGNOSIS — C50411 Malignant neoplasm of upper-outer quadrant of right female breast: Secondary | ICD-10-CM

## 2014-12-22 LAB — COMPREHENSIVE METABOLIC PANEL (CC13)
ALBUMIN: 3.6 g/dL (ref 3.5–5.0)
ALT: 32 U/L (ref 0–55)
ANION GAP: 6 meq/L (ref 3–11)
AST: 33 U/L (ref 5–34)
Alkaline Phosphatase: 75 U/L (ref 40–150)
BUN: 6.9 mg/dL — ABNORMAL LOW (ref 7.0–26.0)
CALCIUM: 9.3 mg/dL (ref 8.4–10.4)
CHLORIDE: 110 meq/L — AB (ref 98–109)
CO2: 26 mEq/L (ref 22–29)
Creatinine: 0.8 mg/dL (ref 0.6–1.1)
EGFR: >90 mL/min/{1.73_m2} (ref >90)
Glucose: 96 mg/dl (ref 70–140)
Potassium: 4 mEq/L (ref 3.5–5.1)
SODIUM: 141 meq/L (ref 136–145)
Total Bilirubin: 0.32 mg/dL (ref 0.20–1.20)
Total Protein: 7.4 g/dL (ref 6.4–8.3)

## 2014-12-22 LAB — CBC & DIFF AND RETIC
BASO%: 0.7 % (ref 0.0–2.0)
Basophils Absolute: 0 10*3/uL (ref 0.0–0.1)
EOS%: 1.2 % (ref 0.0–7.0)
Eosinophils Absolute: 0.1 10*3/uL (ref 0.0–0.5)
HCT: 36.9 % (ref 34.8–46.6)
HEMOGLOBIN: 12.3 g/dL (ref 11.6–15.9)
Immature Retic Fract: 0.7 % — ABNORMAL LOW (ref 1.60–10.00)
LYMPH#: 1.8 10*3/uL (ref 0.9–3.3)
LYMPH%: 31 % (ref 14.0–49.7)
MCH: 29.2 pg (ref 25.1–34.0)
MCHC: 33.3 g/dL (ref 31.5–36.0)
MCV: 87.6 fL (ref 79.5–101.0)
MONO#: 0.2 10*3/uL (ref 0.1–0.9)
MONO%: 4.1 % (ref 0.0–14.0)
NEUT%: 63 % (ref 38.4–76.8)
NEUTROS ABS: 3.7 10*3/uL (ref 1.5–6.5)
PLATELETS: 144 10*3/uL — AB (ref 145–400)
RBC: 4.21 10*6/uL (ref 3.70–5.45)
RDW: 13.9 % (ref 11.2–14.5)
RETIC %: 1.51 % (ref 0.70–2.10)
Retic Ct Abs: 63.57 10*3/uL (ref 33.70–90.70)
WBC: 5.8 10*3/uL (ref 3.9–10.3)

## 2014-12-22 NOTE — Progress Notes (Signed)
[facility]  [phone] [phone]  Clinic New Consult Note   Patient Care Team: Elwyn Reach, MD as PCP - General (Internal Medicine) Autumn Messing III, MD as Consulting Physician (General Surgery) 12/22/2014  CHIEF COMPLAINTS/PURPOSE OF CONSULTATION:  Evaluation for adjuvant chemotherapy  Oncology History   Breast cancer of upper-outer quadrant of right female breast s/p Right MRM 10/30/14   Staging form: Breast, AJCC 7th Edition     Pathologic: Stage IIA (T1b, N1a, cM0) - Unsigned       Breast cancer of upper-outer quadrant of right female breast s/p Right MRM 10/30/14   06/03/2004 Cancer Diagnosis Left breast DCIS, status post mastectomy   06/06/2014 Imaging A mammogram and ultrasound showed a irregular 0.7 cm mass at the 12:30 clock position of right breast. Axillary nodes were negative.   08/12/2014 Receptors her2 ER 100% positive, PR 88% positive, HER-2 negative. Ki-67 19%.   08/12/2014 Initial Biopsy Right breast 12:30 o'clock biopsy showed invasive ductal carcinoma, grade 2.   10/30/2014 Surgery Right breast mastectomy and axillary node dissection, surgical margins were negative.    10/30/2014 Initial Diagnosis Breast cancer of upper-outer quadrant of right female breast s/p Right MRM 10/30/14   10/30/2014 Pathology Results Right breast radical mastectomy showed invasive ductal carcinoma, grade 1, tumor measuring 1.0 cm, margins were negative lymphovascular invasion (+), 1 sentinel lymph nodes positive, 15 additional axillary nodes negative.  (+) DCIS      HISTORY OF PRESENTING ILLNESS:  Marissa Brooks 59 y.o. female is here because of recently diagnosed right breast cancer.   This was discovered by screening mammogram in January 2016. She did not have palpable breast mass or any other symptoms. Biopsy on 08/12/2014 showed invasive ductal carcinoma, ER/PR positive, HER-2 negative. She underwent right breast mastectomy by Dr. Marcello Moores on 10/28/2014. Marland Kitchen    She has had some pain underneath her R armpit with some swelling. She reports that her "drain fell out" and was seen by Dr. Marlou Starks on 7/15 at which time a seroma was drained. Otherwise, she denies any problems with appetite, energy, pain. She does report having lost weight 195 lbs to 186 lbs though over an unspecified time interval. She is now menopausal with last menstrual period being sometime in her late 26s.  Per records obtained from [facility], she underwent left simple mastectomy on at [facility] on 07/16/04 after detection of multiple abnormalities in the left breast on mammography and was diagnosed with DCIS. On 05/27/05, she underwent placement of left tissue expander but declined additional reconstructive surgery. On 06/15/06, she was seen by Dr. Sophronia Simas for possible adjuvant tamoxifen and was recommended to undergo close surveillance. 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   She follows with Dr. Gala Romney for PCP care and was last seen by them prior to the surgery. She is currently undergoing substance abuse rehab. She reports that 1 pack of cigarettes lasts her a week and has not used cocaine for a month ago. She does report using heroin in the past. She also has history of bipolar for which she is on Abilify. She is widowed, has no children. She lives with a roommate who she has known for the last 10 years.  INTERIM HISTORY Marissa Brooks returns for follow up. She attended our chemo class last week, and has concerns about the side effects of chemotherapy. She returns to clinic to discuss further, and she is accompanied by her friend  Dereath Mahatha. She is doing well overall, has no new complains since I saw her last time. She still has some discomfort at her surgical site, with the fluids collection. No fever or chills.   MEDICAL HISTORY:  Past Medical History  Diagnosis Date  . Hypertension   . Shortness of breath dyspnea      with walking  . Pneumonia   . Anxiety   . Depression   . Bipolar disorder   . GERD (gastroesophageal reflux disease)   . Arthritis   . Cancer 2002    left breast cancer tx in [place], right breast cancer 08/2014  . ETOH abuse     as of 10/21/14 none for a week  . Cocaine abuse     last use 10/13/14    SURGICAL HISTORY: Past Surgical History  Procedure Laterality Date  . Mastectomy      and Implant  . Mandible fracture surgery    . Breast surgery  2002    left mastectomy with implant  . Tonsillectomy    . Mastectomy w/ sentinel node biopsy Right 10/30/2014  . Simple mastectomy with axillary sentinel node biopsy Right 10/30/2014    Procedure: RIGHT MASTECTOMY WITH SENTINEL NODE MAPPING;  Surgeon: Autumn Messing III, MD;  Location: [facility];  Service: General;  Laterality: Right;    SOCIAL HISTORY: History   Social History  . Marital Status: Widowed    Spouse Name: N/A  . Number of Children: N/A  . Years of Education: N/A   Occupational History  . Not on file.   Social History Main Topics  . Smoking status: Current Every Day Smoker -- 0.25 packs/day    Types: Cigarettes  . Smokeless tobacco: Never Used  . Alcohol Use: Yes     Comment: was a heavy drinker (daily) none for a week (as of 10/21/14.  . Drug Use: Yes    Special: Cocaine, Marijuana     Comment: last use of cocaine ?10/13/14, last use of marijuana 3 weeks ago (as of 10/21/14)  . Sexual Activity: Not Currently    Birth Control/ Protection: Abstinence   Other Topics Concern  . Not on file   Social History Narrative    FAMILY HISTORY: Family History  Problem Relation Age of Onset  . Breast cancer Sister 23  . Mental illness Mother   . HIV/AIDS Sister     Deceased    ALLERGIES:  is allergic to centrum.  MEDICATIONS:  Current Outpatient Prescriptions  Medication Sig Dispense Refill  . ARIPiprazole (ABILIFY) 5 MG tablet Take 1 tablet (5 mg total) by mouth daily. For mood control 30 tablet 0  .  lisinopril (PRINIVIL,ZESTRIL) 10 MG tablet Take 10 mg by mouth daily.    . ondansetron (ZOFRAN) 8 MG tablet Take 1 tablet (8 mg total) by mouth 2 (two) times daily. Start the day after chemo for 3 days. Then take as needed for nausea or vomiting. 30 tablet 1  . oxyCODONE (OXY IR/ROXICODONE) 5 MG immediate release tablet Take 1-2 tablets (5-10 mg total) by mouth every 4 (four) hours as needed for moderate pain, severe pain or breakthrough pain. 40 tablet 0  . sertraline (ZOLOFT) 100 MG tablet Take 1 tablet (100 mg total) by mouth at bedtime. For depression 30 tablet 0  . traZODone (DESYREL) 100 MG tablet Take 1 tablet (100 mg total) by mouth at bedtime. For sleep (Patient taking differently: Take 100 mg by mouth at bedtime as needed. For sleep) 30 tablet 0  No current facility-administered medications for this visit.    REVIEW OF SYSTEMS:   Constitutional: Denies fevers, chills or abnormal night sweats Eyes: Denies blurriness of vision, double vision or watery eyes Ears, nose, mouth, throat, and face: Denies mucositis or sore throat Respiratory: Denies cough, dyspnea or wheezes Cardiovascular: Denies palpitation, chest discomfort or lower extremity swelling Gastrointestinal:  Denies nausea, heartburn or change in bowel habits Skin: Denies abnormal skin rashes Lymphatics: Denies new lymphadenopathy or easy bruising Neurological:Denies numbness, tingling or new weaknesses Behavioral/Psych: Mood is stable, no new changes  All other systems were reviewed with the patient and are negative.  PHYSICAL EXAMINATION: ECOG PERFORMANCE STATUS: 0 - Asymptomatic  Filed Vitals:   12/22/14 0847  BP: 163/67  Pulse: 70  Temp: 98.4 F (36.9 C)  Resp: 18   Filed Weights   12/22/14 0847  Weight: 184 lb 14.4 oz (83.87 kg)    GENERAL:alert, no distress and comfortable SKIN: skin color, texture, turgor are normal, no rashes or significant lesions EYES: normal, conjunctiva are pink and non-injected,  sclera clear OROPHARYNX:no exudate, no erythema and lips, buccal mucosa, and tongue normal  NECK: supple, thyroid normal size, non-tender, without nodularity LYMPH:  no palpable lymphadenopathy in the cervical, axillary or inguinal BREAST: s/p bilateral mastectomy, marked swelling noted just inferior the right axilla extending across to the front, surgical scars noted consistent with prior procedures. Exam of bilateral axilla revealed no palpable mass or adenopathy. LUNGS: clear to auscultation and percussion with normal breathing effort HEART: regular rate & rhythm and no murmurs and no lower extremity edema ABDOMEN:abdomen soft, non-tender and normal bowel sounds Musculoskeletal:no cyanosis of digits and no clubbing  PSYCH: alert & oriented x 3, some degree of cognitive impairment noted throughout the interview NEURO: no focal motor/sensory deficits  LABORATORY DATA:  I have reviewed the data as listed CBC Latest Ref Rng 12/16/2014 10/21/2014 07/08/2013  WBC 3.9 - 10.3 10e3/uL 5.7 6.9 7.9  Hemoglobin 11.6 - 15.9 g/dL 12.4 13.5 12.7  Hematocrit 34.8 - 46.6 % 37.5 39.4 38.3  Platelets 145 - 400 10e3/uL 169 174 179    CMP Latest Ref Rng 12/16/2014 10/21/2014 07/08/2013  Glucose 70 - 140 mg/dl 99 94 112(H)  BUN 7.0 - 26.0 mg/dL 15._0 Creatinine 0.6 - 1.1 mg/dL 0.8 1.03(H) 1.12(H)  Sodium 136 - 145 mEq/L 140 138 141  Potassium 3.5 - 5.1 mEq/L 4.5 4.4 5.0  Chloride 101 - 111 mmol/L - 105 105  CO2 22 - 29 mEq/L _1 Calcium 8.4 - 10.4 mg/dL 9.7 9.5 9.8  Total Protein 6.4 - 8.3 g/dL 7.4 7.6 7.8  Total Bilirubin 0.20 - 1.20 mg/dL 0.38 0.6 0.3  Alkaline Phos 40 - 150 U/L 74 65 87  AST 5 - 34 U/L 31 38 27  ALT 0 - 55 U/L 33 44 28    PATHOLOGY REPORT 10/30/2014 ADDITIONAL INFORMATION: 2. FLUORESCENCE IN-SITU HYBRIDIZATION  Results: HER2 - NEGATIVE RATIO OF HER2/CEP17 SIGNALS 1.62 AVERAGE HER2 COPY NUMBER PER CELL 2.10 Reference Range: NEGATIVE HER2/CEP17 Ratio <2.0 and average  HER2 copy number <4.0 EQUIVOCAL HER2/CEP17 Ratio <2.0 and average HER2 copy number 4.0 and <6.0 POSITIVE HER2/CEP17 Ratio >=2.0 or <2.0 and average HER2 copy number >=6.0 Mali RUND DO Pathologist, Electronic Signature ( Signed 11/26/2014) 2. her2 This is NOT signed out FINAL DIAGNOSIS Diagnosis 1. Lymph node, sentinel, biopsy, Right #1 - ONE LYMPH NODE, POSITIVE FOR METASTATIC MAMMARY CARCINOMA (1/1). - INTRANODAL TUMOR DEPOSIT IS 1.2 CM -  POSITIVE FOR EXTRACAPSULAR TUMOR EXTENSION. 2. Breast, radical mastectomy (including lymph nodes), Right and axillary contents - INVASIVE DUCTAL CARCINOMA, SEE COMMENT. - POSITIVE FOR LYMPH VASCULAR INVASION. - DUCTAL CARCINOMA IN SITU WITH NECROSIS AND CALCIFICATIONS. - FIFTEEN LYMPH NODE S, NEGATIVE FOR TUMOR (0/15). - PREVIOUS BIOPSY SITE. - SEE TUMOR SYNOPTIC TEMPLATE BELOW  Microscopic Comment 2. BREAST, INVASIVE TUMOR, WITH LYMPH NODES PRESENT Specimen, including laterality and lymph node sampling (sentinel, non-sentinel): Right breast with sentinel lymph node sampling Procedure: Radical mastectomy Histologic type: Ductal Grade: I of III Tubule formation: 2 Nuclear pleomorphism: 2 Mitotic: 1 Tumor size (gross measurement): 1.0 cm Margins: Invasive, distance to closest margin: 1.8 cm (posterior) In-situ, distance to closest margin: 1.8 cm (posterior) If margin positive, focally or broadly: N/A Lymphovascular invasion: Present Ductal carcinoma in situ: Present Grade: 2 of 3 Extensive intraductal component: Absent Lobular neoplasia: Absent Tumor focality: Unifocal Treatment effect: None If present, treatment effect in breast tissue, lymph nodes or both: N/A Extent of tumor: Skin: N/A Nipple: N/A Skeletal muscle: N/A Lymph nodes: Examined: 1 Sentinel 15 Non-sentinel 16 Total Lymph nodes with metastasis: 1 Isolated tumor cells (< 0.2 mm): 0 Micrometastasis: (> 0.2 mm and < 2.0 mm): 0 Macrometastasis: (> 2.0 mm): x  1 Extracapsular extension: Present Breast prognostic profile: Estrogen receptor: Not repeated, previous study demonstrated 100% positivity ([id number]) Progesterone receptor: Not repeated, previous study demonstrated 88% positivity ([id number]) Her 2 neu: Repeated, previous study demonstrated no amplification ([id number]) Ki-67: Not repeated, previous study demonstrated 19% proliferation rate ([id number]) Non-neoplastic breast: Previous biopsy site, fibrocystic change, columnar cell change, sclerosing adenosis, benign adenosis and calcifications TNM: p T1b, pN1a, pMX   RADIOGRAPHIC STUDIES: I have personally reviewed the radiological images as listed and agreed with the findings in the report. No results found.  ASSESSMENT & PLAN:  Ms. Holben is a 59 year old female with h/o left DCIS s/p mastectomy who presents with R invasive ductal carcinoma s/p mastectomy  1. Stage IIa, pT1bpN1a,M0, right invasive ductal carcinoma s/p mastectomy, ER+/PR+/HER- , G1 -Discussed with the patient the results of her surgical path and her cancer staging -The natural history of breast cancer were reviewed with her, and moderate risk of cancer recurrence after her surgery, giving the note positive disease. -Giving the note positive disease, I recommend adjuvant chemotherapy with docetaxel and Cytoxan every 3 weeks for 4 cycles. I again reviewed all potential side effects from chemotherapy, and explained the benefit to patient and her friend, and the patient agrees to proceed. Although I think she does not comprehend everything, she was able to repeat the main points I told her today, and understands the basics (benefit and risks of chemotherapy) well. -Her friend Dereath agrees to check on her for close monitoring after chemo at home, and will speak with her roommate who should also be able to help her put at home -I will speak with her substance abuse rehabilitation program coordinator Olivia Mackie with pt's permission   -I will also refer her to radiation oncology to consider adjuvant irradiation -Giving the strong ER/PR positivity of her tumor, I would recommend adjuvant aromatase inhibitor to reduce her risk of cancer recurrence after she completes radiation.  2. Genetics -Giving her asthma history of breast cancer twice, and family history (sister) of breast cancer, I'll refer her to see a genetic counser -She does not have children, but does have sisters.  2. History of cirrhosis and HCV infection -No imaging or additional workup found in our system though noted to have undergone  treatment for HCV in 1999 at [facility] per note by Aurora Mask  [06/02/09] -Most recent LFTs reassuring -She still has very high HCV load, will refer her to liver clinic when she completes adjuvant chemo   3. Polysubstance abuse: Recently hospitalized in February 2016 for substance-induced mood disorder. -Continue to encourage patient to continue with substance rehab and avoid illicit substances -She agrees to quit alcohol and illicit drug (cocane) completely when she is on chemo, she will working on smoking cessation also  4. Hypertension: Normotensive today.  -Defer to PCP  5. Mood disorder: She does not follow with a psychiatrist and reports her PCP prescribes all of her psychiatric medications.  -Defer to PCP  Plan -Reschedule her chemotherapy from today to August 5, first cycle TC -I will see her back on 8/12 for toxicity check up   I have seen the patient, examined her. I agree with the assessment and and plan and have edited the notes.   Truitt Merle  12/22/2014

## 2014-12-22 NOTE — Telephone Encounter (Signed)
Per staff message and POF I have scheduled appts. Advised scheduler of appts. JMW  

## 2014-12-22 NOTE — Telephone Encounter (Signed)
lvm fo rpt regarding to Aug appts.Marland Kitchen

## 2014-12-22 NOTE — Telephone Encounter (Signed)
Pt confirmed labs/ov per 08/01 POF, gave pt avs and calendar... KJ, sent msg to r/s chemo

## 2014-12-25 ENCOUNTER — Telehealth: Payer: Self-pay | Admitting: *Deleted

## 2014-12-25 ENCOUNTER — Other Ambulatory Visit: Payer: Self-pay | Admitting: Hematology

## 2014-12-25 DIAGNOSIS — C50411 Malignant neoplasm of upper-outer quadrant of right female breast: Secondary | ICD-10-CM

## 2014-12-25 DIAGNOSIS — F1910 Other psychoactive substance abuse, uncomplicated: Secondary | ICD-10-CM

## 2014-12-25 NOTE — Telephone Encounter (Signed)
Received call from triage dept stating that call was received from a D. Mahatfa wanting to talk to Dr Burr Medico.  Tammi/RN/Triage states this person is not listed on HIP[employer].  Note to Dr Burr Medico.

## 2014-12-26 ENCOUNTER — Other Ambulatory Visit: Payer: Self-pay | Admitting: *Deleted

## 2014-12-26 ENCOUNTER — Ambulatory Visit: Payer: Medicare HMO

## 2014-12-26 ENCOUNTER — Telehealth: Payer: Self-pay | Admitting: Hematology

## 2014-12-26 NOTE — Telephone Encounter (Signed)
Pt came in the office states she was rerouted in traffic this morning and caused her to be late. Sent msg to r/s chemo

## 2014-12-27 ENCOUNTER — Ambulatory Visit: Payer: Self-pay

## 2014-12-29 ENCOUNTER — Telehealth: Payer: Self-pay | Admitting: Hematology

## 2014-12-29 NOTE — Telephone Encounter (Signed)
Spoke with patient and she is aware of the importance to keep her appointment 8/9

## 2014-12-30 ENCOUNTER — Encounter: Payer: Self-pay | Admitting: *Deleted

## 2014-12-30 ENCOUNTER — Ambulatory Visit (HOSPITAL_BASED_OUTPATIENT_CLINIC_OR_DEPARTMENT_OTHER): Payer: Medicare HMO

## 2014-12-30 ENCOUNTER — Other Ambulatory Visit: Payer: Self-pay | Admitting: *Deleted

## 2014-12-30 ENCOUNTER — Other Ambulatory Visit (HOSPITAL_COMMUNITY)
Admission: RE | Admit: 2014-12-30 | Discharge: 2014-12-30 | Disposition: A | Discharge status: Home / Self Care | Payer: Medicare HMO | Source: Ambulatory Visit | Attending: Hematology | Admitting: Hematology

## 2014-12-30 ENCOUNTER — Telehealth: Payer: Self-pay | Admitting: Hematology

## 2014-12-30 VITALS — BP 186/59 | HR 50 | Temp 97.4°F | Resp 16

## 2014-12-30 DIAGNOSIS — F1910 Other psychoactive substance abuse, uncomplicated: Secondary | ICD-10-CM | POA: Insufficient documentation

## 2014-12-30 DIAGNOSIS — C50211 Malignant neoplasm of upper-inner quadrant of right female breast: Secondary | ICD-10-CM | POA: Diagnosis not present

## 2014-12-30 DIAGNOSIS — Z5111 Encounter for antineoplastic chemotherapy: Secondary | ICD-10-CM

## 2014-12-30 DIAGNOSIS — C50411 Malignant neoplasm of upper-outer quadrant of right female breast: Secondary | ICD-10-CM

## 2014-12-30 LAB — CBC WITH DIFFERENTIAL/PLATELET
BASO%: 0.4 % (ref 0.0–2.0)
Basophils Absolute: 0 10*3/uL (ref 0.0–0.1)
EOS%: 3.2 % (ref 0.0–7.0)
Eosinophils Absolute: 0.2 10*3/uL (ref 0.0–0.5)
HCT: 38.2 % (ref 34.8–46.6)
HEMOGLOBIN: 12.5 g/dL (ref 11.6–15.9)
LYMPH%: 35 % (ref 14.0–49.7)
MCH: 28.6 pg (ref 25.1–34.0)
MCHC: 32.7 g/dL (ref 31.5–36.0)
MCV: 87.4 fL (ref 79.5–101.0)
MONO#: 0.2 10*3/uL (ref 0.1–0.9)
MONO%: 4.9 % (ref 0.0–14.0)
NEUT%: 56.5 % (ref 38.4–76.8)
NEUTROS ABS: 2.6 10*3/uL (ref 1.5–6.5)
Platelets: 135 10*3/uL — ABNORMAL LOW (ref 145–400)
RBC: 4.37 10*6/uL (ref 3.70–5.45)
RDW: 13.8 % (ref 11.2–14.5)
WBC: 4.7 10*3/uL (ref 3.9–10.3)
lymph#: 1.6 10*3/uL (ref 0.9–3.3)

## 2014-12-30 LAB — COMPREHENSIVE METABOLIC PANEL (CC13)
ALK PHOS: 79 U/L (ref 40–150)
ALT: 32 U/L (ref 0–55)
ANION GAP: 8 meq/L (ref 3–11)
AST: 33 U/L (ref 5–34)
Albumin: 3.7 g/dL (ref 3.5–5.0)
BILIRUBIN TOTAL: 0.32 mg/dL (ref 0.20–1.20)
BUN: 9.8 mg/dL (ref 7.0–26.0)
CO2: 26 mEq/L (ref 22–29)
Calcium: 9.5 mg/dL (ref 8.4–10.4)
Chloride: 110 mEq/L — ABNORMAL HIGH (ref 98–109)
Creatinine: 0.8 mg/dL (ref 0.6–1.1)
Glucose: 103 mg/dl (ref 70–140)
Potassium: 4.2 mEq/L (ref 3.5–5.1)
Sodium: 143 mEq/L (ref 136–145)
TOTAL PROTEIN: 7.2 g/dL (ref 6.4–8.3)

## 2014-12-30 LAB — RAPID URINE DRUG SCREEN, HOSP PERFORMED
Amphetamines: NOT DETECTED
BARBITURATES: NOT DETECTED
Benzodiazepines: NOT DETECTED
Cocaine: POSITIVE — AB
Opiates: NOT DETECTED
TETRAHYDROCANNABINOL: NOT DETECTED

## 2014-12-30 MED ORDER — SODIUM CHLORIDE 0.9 % IV SOLN
Freq: Once | INTRAVENOUS | Status: AC
  Administered 2014-12-30: 11:00:00 via INTRAVENOUS
  Filled 2014-12-30: qty 8

## 2014-12-30 MED ORDER — SODIUM CHLORIDE 0.9 % IV SOLN
Freq: Once | INTRAVENOUS | Status: AC
  Administered 2014-12-30: 10:00:00 via INTRAVENOUS

## 2014-12-30 MED ORDER — DOCETAXEL CHEMO INJECTION 160 MG/16ML
75.0000 mg/m2 | Freq: Once | INTRAVENOUS | Status: AC
  Administered 2014-12-30: 150 mg via INTRAVENOUS
  Filled 2014-12-30: qty 15

## 2014-12-30 MED ORDER — CYCLOPHOSPHAMIDE CHEMO INJECTION 1 GM
600.0000 mg/m2 | Freq: Once | INTRAMUSCULAR | Status: AC
  Administered 2014-12-30: 1160 mg via INTRAVENOUS
  Filled 2014-12-30: qty 58

## 2014-12-30 NOTE — Patient Instructions (Signed)
[facility] Discharge Instructions for Patients Receiving Chemotherapy  Today you received the following chemotherapy agents: Taxotere, Cytoxan  To help prevent nausea and vomiting after your treatment, we encourage you to take your nausea medication as prescribed by your physician: Zofran 8 mg every 8 hrs as needed for nausea. You may take one dose tonight before bedtime to prevent nausea, then every 8 hrs as needed.   If you develop nausea and vomiting that is not controlled by your nausea medication, call the clinic.   BELOW ARE SYMPTOMS THAT SHOULD BE REPORTED IMMEDIATELY:  *FEVER GREATER THAN 100.5 F  *CHILLS WITH OR WITHOUT FEVER  NAUSEA AND VOMITING THAT IS NOT CONTROLLED WITH YOUR NAUSEA MEDICATION  *UNUSUAL SHORTNESS OF BREATH  *UNUSUAL BRUISING OR BLEEDING  TENDERNESS IN MOUTH AND THROAT WITH OR WITHOUT PRESENCE OF ULCERS  *URINARY PROBLEMS  *BOWEL PROBLEMS  UNUSUAL RASH Items with * indicate a potential emergency and should be followed up as soon as possible.  Feel free to call the clinic you have any questions or concerns. The clinic phone number is [phone].  Please show the [facility] at check-in to the Emergency Department and triage nurse.  Docetaxel injection What is this medicine? DOCETAXEL (doe se TAX el) is a chemotherapy drug. It targets fast dividing cells, like cancer cells, and causes these cells to die. This medicine is used to treat many types of cancers like breast cancer, certain stomach cancers, head and neck cancer, lung cancer, and prostate cancer. This medicine may be used for other purposes; ask your health care provider or pharmacist if you have questions. COMMON BRAND NAME(S): Docefrez, Taxotere What should I tell my health care provider before I take this medicine? They need to know if you have any of these conditions: -infection (especially a virus infection such as chickenpox, cold sores, or herpes) -liver  disease -low blood counts, like low white cell, platelet, or red cell counts -an unusual or allergic reaction to docetaxel, polysorbate 80, other chemotherapy agents, other medicines, foods, dyes, or preservatives -pregnant or trying to get pregnant -breast-feeding How should I use this medicine? This drug is given as an infusion into a vein. It is administered in a hospital or clinic by a specially trained health care professional. Talk to your pediatrician regarding the use of this medicine in children. Special care may be needed. Overdosage: If you think you have taken too much of this medicine contact a poison control center or emergency room at once. NOTE: This medicine is only for you. Do not share this medicine with others. What if I miss a dose? It is important not to miss your dose. Call your doctor or health care professional if you are unable to keep an appointment. What may interact with this medicine? -cyclosporine -erythromycin -ketoconazole -medicines to increase blood counts like filgrastim, pegfilgrastim, sargramostim -vaccines Talk to your doctor or health care professional before taking any of these medicines: -acetaminophen -aspirin -ibuprofen -ketoprofen -naproxen This list may not describe all possible interactions. Give your health care provider a list of all the medicines, herbs, non-prescription drugs, or dietary supplements you use. Also tell them if you smoke, drink alcohol, or use illegal drugs. Some items may interact with your medicine. What should I watch for while using this medicine? Your condition will be monitored carefully while you are receiving this medicine. You will need important blood work done while you are taking this medicine. This drug may make you feel generally unwell.  This is not uncommon, as chemotherapy can affect healthy cells as well as cancer cells. Report any side effects. Continue your course of treatment even though you feel ill  unless your doctor tells you to stop. In some cases, you may be given additional medicines to help with side effects. Follow all directions for their use. Call your doctor or health care professional for advice if you get a fever, chills or sore throat, or other symptoms of a cold or flu. Do not treat yourself. This drug decreases your body's ability to fight infections. Try to avoid being around people who are sick. This medicine may increase your risk to bruise or bleed. Call your doctor or health care professional if you notice any unusual bleeding. Be careful brushing and flossing your teeth or using a toothpick because you may get an infection or bleed more easily. If you have any dental work done, tell your dentist you are receiving this medicine. Avoid taking products that contain aspirin, acetaminophen, ibuprofen, naproxen, or ketoprofen unless instructed by your doctor. These medicines may hide a fever. This medicine contains an alcohol in the product. You may get drowsy or dizzy. Do not drive, use machinery, or do anything that needs mental alertness until you know how this medicine affects you. Do not stand or sit up quickly, especially if you are an older patient. This reduces the risk of dizzy or fainting spells. Avoid alcoholic drinks Do not become pregnant while taking this medicine. Women should inform their doctor if they wish to become pregnant or think they might be pregnant. There is a potential for serious side effects to an unborn child. Talk to your health care professional or pharmacist for more information. Do not breast-feed an infant while taking this medicine. What side effects may I notice from receiving this medicine? Side effects that you should report to your doctor or health care professional as soon as possible: -allergic reactions like skin rash, itching or hives, swelling of the face, lips, or tongue -low blood counts - This drug may decrease the number of white blood  cells, red blood cells and platelets. You may be at increased risk for infections and bleeding. -signs of infection - fever or chills, cough, sore throat, pain or difficulty passing urine -signs of decreased platelets or bleeding - bruising, pinpoint red spots on the skin, black, tarry stools, nosebleeds -signs of decreased red blood cells - unusually weak or tired, fainting spells, lightheadedness -breathing problems -fast or irregular heartbeat -low blood pressure -mouth sores -nausea and vomiting -pain, swelling, redness or irritation at the injection site -pain, tingling, numbness in the hands or feet -swelling of the ankle, feet, hands -weight gain Side effects that usually do not require medical attention (report to your prescriber or health care professional if they continue or are bothersome): -bone pain -complete hair loss including hair on your head, underarms, pubic hair, eyebrows, and eyelashes -diarrhea -excessive tearing -changes in the color of fingernails -loosening of the fingernails -nausea -muscle pain -red flush to skin -sweating -weak or tired This list may not describe all possible side effects. Call your doctor for medical advice about side effects. You may report side effects to FDA at 1-800-FDA-1088. Where should I keep my medicine? This drug is given in a hospital or clinic and will not be stored at home. NOTE: This sheet is a summary. It may not cover all possible information. If you have questions about this medicine, talk to your doctor, pharmacist, or health  care provider.  2015, Elsevier/Gold Standard. (2013-04-04 22:21:02)  Cyclophosphamide injection (Cytoxan) What is this medicine? CYCLOPHOSPHAMIDE (sye kloe FOSS fa mide) is a chemotherapy drug. It slows the growth of cancer cells. This medicine is used to treat many types of cancer like lymphoma, myeloma, leukemia, breast cancer, and ovarian cancer, to name a few. This medicine may be used for  other purposes; ask your health care provider or pharmacist if you have questions. COMMON BRAND NAME(S): Cytoxan, Neosar What should I tell my health care provider before I take this medicine? They need to know if you have any of these conditions: -blood disorders -history of other chemotherapy -infection -kidney disease -liver disease -recent or ongoing radiation therapy -tumors in the bone marrow -an unusual or allergic reaction to cyclophosphamide, other chemotherapy, other medicines, foods, dyes, or preservatives -pregnant or trying to get pregnant -breast-feeding How should I use this medicine? This drug is usually given as an injection into a vein or muscle or by infusion into a vein. It is administered in a hospital or clinic by a specially trained health care professional. Talk to your pediatrician regarding the use of this medicine in children. Special care may be needed. Overdosage: If you think you have taken too much of this medicine contact a poison control center or emergency room at once. NOTE: This medicine is only for you. Do not share this medicine with others. What if I miss a dose? It is important not to miss your dose. Call your doctor or health care professional if you are unable to keep an appointment. What may interact with this medicine? This medicine may interact with the following medications: -amiodarone -amphotericin B -azathioprine -certain antiviral medicines for HIV or AIDS such as protease inhibitors (e.g., indinavir, ritonavir) and zidovudine -certain blood pressure medications such as benazepril, captopril, enalapril, fosinopril, lisinopril, moexipril, monopril, perindopril, quinapril, ramipril, trandolapril -certain cancer medications such as anthracyclines (e.g., daunorubicin, doxorubicin), busulfan, cytarabine, paclitaxel, pentostatin, tamoxifen, trastuzumab -certain diuretics such as chlorothiazide, chlorthalidone, hydrochlorothiazide, indapamide,  metolazone -certain medicines that treat or prevent blood clots like warfarin -certain muscle relaxants such as succinylcholine -cyclosporine -etanercept -indomethacin -medicines to increase blood counts like filgrastim, pegfilgrastim, sargramostim -medicines used as general anesthesia -metronidazole -natalizumab This list may not describe all possible interactions. Give your health care provider a list of all the medicines, herbs, non-prescription drugs, or dietary supplements you use. Also tell them if you smoke, drink alcohol, or use illegal drugs. Some items may interact with your medicine. What should I watch for while using this medicine? Visit your doctor for checks on your progress. This drug may make you feel generally unwell. This is not uncommon, as chemotherapy can affect healthy cells as well as cancer cells. Report any side effects. Continue your course of treatment even though you feel ill unless your doctor tells you to stop. Drink water or other fluids as directed. Urinate often, even at night. In some cases, you may be given additional medicines to help with side effects. Follow all directions for their use. Call your doctor or health care professional for advice if you get a fever, chills or sore throat, or other symptoms of a cold or flu. Do not treat yourself. This drug decreases your body's ability to fight infections. Try to avoid being around people who are sick. This medicine may increase your risk to bruise or bleed. Call your doctor or health care professional if you notice any unusual bleeding. Be careful brushing and flossing your teeth or using  a toothpick because you may get an infection or bleed more easily. If you have any dental work done, tell your dentist you are receiving this medicine. You may get drowsy or dizzy. Do not drive, use machinery, or do anything that needs mental alertness until you know how this medicine affects you. Do not become pregnant while  taking this medicine or for 1 year after stopping it. Women should inform their doctor if they wish to become pregnant or think they might be pregnant. Men should not father a child while taking this medicine and for 4 months after stopping it. There is a potential for serious side effects to an unborn child. Talk to your health care professional or pharmacist for more information. Do not breast-feed an infant while taking this medicine. This medicine may interfere with the ability to have a child. This medicine has caused ovarian failure in some women. This medicine has caused reduced sperm counts in some men. You should talk with your doctor or health care professional if you are concerned about your fertility. If you are going to have surgery, tell your doctor or health care professional that you have taken this medicine. What side effects may I notice from receiving this medicine? Side effects that you should report to your doctor or health care professional as soon as possible: -allergic reactions like skin rash, itching or hives, swelling of the face, lips, or tongue -low blood counts - this medicine may decrease the number of white blood cells, red blood cells and platelets. You may be at increased risk for infections and bleeding. -signs of infection - fever or chills, cough, sore throat, pain or difficulty passing urine -signs of decreased platelets or bleeding - bruising, pinpoint red spots on the skin, black, tarry stools, blood in the urine -signs of decreased red blood cells - unusually weak or tired, fainting spells, lightheadedness -breathing problems -dark urine -dizziness -palpitations -swelling of the ankles, feet, hands -trouble passing urine or change in the amount of urine -weight gain -yellowing of the eyes or skin Side effects that usually do not require medical attention (report to your doctor or health care professional if they continue or are bothersome): -changes in nail  or skin color -hair loss -missed menstrual periods -mouth sores -nausea, vomiting This list may not describe all possible side effects. Call your doctor for medical advice about side effects. You may report side effects to FDA at 1-800-FDA-1088. Where should I keep my medicine? This drug is given in a hospital or clinic and will not be stored at home. NOTE: This sheet is a summary. It may not cover all possible information. If you have questions about this medicine, talk to your doctor, pharmacist, or health care provider.  2015, Elsevier/Gold Standard. (2012-03-23 16:22:58)

## 2014-12-30 NOTE — Progress Notes (Signed)
Pt tolerated 1st taxotere/cytoxan well without difficulty. Knows to come back tomorrow at 1:30 for neulasta. Chemo ended at 1:25. No questions or concerns; voiced understanding of teaching and has number to call us. Emphasis on illicit drug/alcohol/tobacco cessation and adequate fluid intake. Urine for Drug screen taken to lab

## 2014-12-30 NOTE — Telephone Encounter (Signed)
Appointments made and  a note was placed for patient to get a new schedule at 8/10 inj appointment

## 2014-12-31 ENCOUNTER — Ambulatory Visit (HOSPITAL_BASED_OUTPATIENT_CLINIC_OR_DEPARTMENT_OTHER): Payer: Medicare HMO

## 2014-12-31 VITALS — BP 139/62 | HR 61 | Temp 98.0°F

## 2014-12-31 DIAGNOSIS — C50211 Malignant neoplasm of upper-inner quadrant of right female breast: Secondary | ICD-10-CM | POA: Diagnosis not present

## 2014-12-31 DIAGNOSIS — C50411 Malignant neoplasm of upper-outer quadrant of right female breast: Secondary | ICD-10-CM

## 2014-12-31 DIAGNOSIS — Z5189 Encounter for other specified aftercare: Secondary | ICD-10-CM | POA: Diagnosis not present

## 2014-12-31 MED ORDER — PEGFILGRASTIM INJECTION 6 MG/0.6ML ~~LOC~~
6.0000 mg | PREFILLED_SYRINGE | Freq: Once | SUBCUTANEOUS | Status: AC
  Administered 2014-12-31: 6 mg via SUBCUTANEOUS
  Filled 2014-12-31: qty 0.6

## 2014-12-31 NOTE — Patient Instructions (Signed)
Pegfilgrastim injection What is this medicine? PEGFILGRASTIM (peg fil GRA stim) is a long-acting granulocyte colony-stimulating factor that stimulates the growth of neutrophils, a type of white blood cell important in the body's fight against infection. It is used to reduce the incidence of fever and infection in patients with certain types of cancer who are receiving chemotherapy that affects the bone marrow. This medicine may be used for other purposes; ask your health care provider or pharmacist if you have questions. COMMON BRAND NAME(S): Neulasta What should I tell my health care provider before I take this medicine? They need to know if you have any of these conditions: -latex allergy -ongoing radiation therapy -sickle cell disease -skin reactions to acrylic adhesives (On-Body Injector only) -an unusual or allergic reaction to pegfilgrastim, filgrastim, other medicines, foods, dyes, or preservatives -pregnant or trying to get pregnant -breast-feeding How should I use this medicine? This medicine is for injection under the skin. If you get this medicine at home, you will be taught how to prepare and give the pre-filled syringe or how to use the On-body Injector. Refer to the patient Instructions for Use for detailed instructions. Use exactly as directed. Take your medicine at regular intervals. Do not take your medicine more often than directed. It is important that you put your used needles and syringes in a special sharps container. Do not put them in a trash can. If you do not have a sharps container, call your pharmacist or healthcare provider to get one. Talk to your pediatrician regarding the use of this medicine in children. Special care may be needed. Overdosage: If you think you have taken too much of this medicine contact a poison control center or emergency room at once. NOTE: This medicine is only for you. Do not share this medicine with others. What if I miss a dose? It is  important not to miss your dose. Call your doctor or health care professional if you miss your dose. If you miss a dose due to an On-body Injector failure or leakage, a new dose should be administered as soon as possible using a single prefilled syringe for manual use. What may interact with this medicine? Interactions have not been studied. Give your health care provider a list of all the medicines, herbs, non-prescription drugs, or dietary supplements you use. Also tell them if you smoke, drink alcohol, or use illegal drugs. Some items may interact with your medicine. This list may not describe all possible interactions. Give your health care provider a list of all the medicines, herbs, non-prescription drugs, or dietary supplements you use. Also tell them if you smoke, drink alcohol, or use illegal drugs. Some items may interact with your medicine. What should I watch for while using this medicine? You may need blood work done while you are taking this medicine. If you are going to need a MRI, CT scan, or other procedure, tell your doctor that you are using this medicine (On-Body Injector only). What side effects may I notice from receiving this medicine? Side effects that you should report to your doctor or health care professional as soon as possible: -allergic reactions like skin rash, itching or hives, swelling of the face, lips, or tongue -dizziness -fever -pain, redness, or irritation at site where injected -pinpoint red spots on the skin -shortness of breath or breathing problems -stomach or side pain, or pain at the shoulder -swelling -tiredness -trouble passing urine Side effects that usually do not require medical attention (report to your doctor   or health care professional if they continue or are bothersome): -bone pain -muscle pain This list may not describe all possible side effects. Call your doctor for medical advice about side effects. You may report side effects to FDA at  1-800-FDA-1088. Where should I keep my medicine? Keep out of the reach of children. Store pre-filled syringes in a refrigerator between 2 and 8 degrees C (36 and 46 degrees F). Do not freeze. Keep in carton to protect from light. Throw away this medicine if it is left out of the refrigerator for more than 48 hours. Throw away any unused medicine after the expiration date. NOTE: This sheet is a summary. It may not cover all possible information. If you have questions about this medicine, talk to your doctor, pharmacist, or health care provider.  2015, Elsevier/Gold Standard. (2013-08-08 16:14:05)  

## 2015-01-02 ENCOUNTER — Ambulatory Visit: Payer: Self-pay | Admitting: Hematology

## 2015-01-02 ENCOUNTER — Other Ambulatory Visit: Payer: Self-pay

## 2015-01-07 ENCOUNTER — Encounter: Payer: Self-pay | Admitting: *Deleted

## 2015-01-07 NOTE — Progress Notes (Signed)
[facility] Work  Clinical Social Work was referred by [facility] as pt had reached out to them for transportation assistance. CSW phoned pt at home and informed her that [employer] has drivers for 6/94 and 8/24. Pt stated understanding and that she had been contacted and was aware her driver was "Stanton Kidney". CSW explained role of CSW team and to reach out as needed. CSW attempted to explore further as pt has h/o substance use and mental health concerns. CSW to follow up at next appt.   Clinical Social Work interventions: Resource education Loren Racer, [place] Worker [place]  [place] Phone: [phone] Fax: [phone]

## 2015-01-09 ENCOUNTER — Other Ambulatory Visit: Payer: Self-pay | Admitting: *Deleted

## 2015-01-09 DIAGNOSIS — C50411 Malignant neoplasm of upper-outer quadrant of right female breast: Secondary | ICD-10-CM

## 2015-01-09 DIAGNOSIS — F1910 Other psychoactive substance abuse, uncomplicated: Secondary | ICD-10-CM

## 2015-01-12 ENCOUNTER — Other Ambulatory Visit: Payer: Self-pay | Admitting: *Deleted

## 2015-01-12 ENCOUNTER — Other Ambulatory Visit: Payer: Self-pay

## 2015-01-12 ENCOUNTER — Ambulatory Visit: Payer: Self-pay

## 2015-01-13 ENCOUNTER — Ambulatory Visit: Payer: Self-pay

## 2015-01-13 ENCOUNTER — Telehealth: Payer: Self-pay | Admitting: Hematology

## 2015-01-13 ENCOUNTER — Ambulatory Visit (HOSPITAL_BASED_OUTPATIENT_CLINIC_OR_DEPARTMENT_OTHER): Payer: Medicare HMO | Admitting: Hematology

## 2015-01-13 ENCOUNTER — Encounter: Payer: Self-pay | Admitting: Hematology

## 2015-01-13 ENCOUNTER — Other Ambulatory Visit (HOSPITAL_BASED_OUTPATIENT_CLINIC_OR_DEPARTMENT_OTHER): Payer: Medicare HMO

## 2015-01-13 VITALS — BP 160/63 | HR 58 | Temp 98.6°F | Resp 18 | Ht 62.0 in | Wt 185.1 lb

## 2015-01-13 DIAGNOSIS — F1910 Other psychoactive substance abuse, uncomplicated: Secondary | ICD-10-CM | POA: Diagnosis not present

## 2015-01-13 DIAGNOSIS — F319 Bipolar disorder, unspecified: Secondary | ICD-10-CM | POA: Diagnosis not present

## 2015-01-13 DIAGNOSIS — Z853 Personal history of malignant neoplasm of breast: Secondary | ICD-10-CM

## 2015-01-13 DIAGNOSIS — D696 Thrombocytopenia, unspecified: Secondary | ICD-10-CM

## 2015-01-13 DIAGNOSIS — C50211 Malignant neoplasm of upper-inner quadrant of right female breast: Secondary | ICD-10-CM

## 2015-01-13 DIAGNOSIS — C50411 Malignant neoplasm of upper-outer quadrant of right female breast: Secondary | ICD-10-CM

## 2015-01-13 DIAGNOSIS — Z8619 Personal history of other infectious and parasitic diseases: Secondary | ICD-10-CM

## 2015-01-13 DIAGNOSIS — Z803 Family history of malignant neoplasm of breast: Secondary | ICD-10-CM

## 2015-01-13 LAB — COMPREHENSIVE METABOLIC PANEL (CC13)
ALT: 35 U/L (ref 0–55)
ANION GAP: 8 meq/L (ref 3–11)
AST: 31 U/L (ref 5–34)
Albumin: 3.4 g/dL — ABNORMAL LOW (ref 3.5–5.0)
Alkaline Phosphatase: 81 U/L (ref 40–150)
BILIRUBIN TOTAL: 0.3 mg/dL (ref 0.20–1.20)
BUN: 7.4 mg/dL (ref 7.0–26.0)
CALCIUM: 8.9 mg/dL (ref 8.4–10.4)
CO2: 26 mEq/L (ref 22–29)
CREATININE: 0.8 mg/dL (ref 0.6–1.1)
Chloride: 110 mEq/L — ABNORMAL HIGH (ref 98–109)
Glucose: 123 mg/dl (ref 70–140)
Potassium: 3.8 mEq/L (ref 3.5–5.1)
SODIUM: 144 meq/L (ref 136–145)
TOTAL PROTEIN: 6.4 g/dL (ref 6.4–8.3)

## 2015-01-13 LAB — CBC & DIFF AND RETIC
BASO%: 0.9 % (ref 0.0–2.0)
Basophils Absolute: 0.1 10*3/uL (ref 0.0–0.1)
EOS%: 0 % (ref 0.0–7.0)
Eosinophils Absolute: 0 10*3/uL (ref 0.0–0.5)
HEMATOCRIT: 35.2 % (ref 34.8–46.6)
HGB: 11.4 g/dL — ABNORMAL LOW (ref 11.6–15.9)
Immature Retic Fract: 12.2 % — ABNORMAL HIGH (ref 1.60–10.00)
LYMPH%: 16 % (ref 14.0–49.7)
MCH: 28.2 pg (ref 25.1–34.0)
MCHC: 32.4 g/dL (ref 31.5–36.0)
MCV: 87 fL (ref 79.5–101.0)
MONO#: 0.5 10*3/uL (ref 0.1–0.9)
MONO%: 4.5 % (ref 0.0–14.0)
NEUT%: 78.6 % — ABNORMAL HIGH (ref 38.4–76.8)
NEUTROS ABS: 8 10*3/uL — AB (ref 1.5–6.5)
Platelets: 86 10*3/uL — ABNORMAL LOW (ref 145–400)
RBC: 4.04 10*6/uL (ref 3.70–5.45)
RDW: 13.5 % (ref 11.2–14.5)
Retic %: 1.69 % (ref 0.70–2.10)
Retic Ct Abs: 68.28 10*3/uL (ref 33.70–90.70)
WBC: 10.1 10*3/uL (ref 3.9–10.3)
lymph#: 1.6 10*3/uL (ref 0.9–3.3)

## 2015-01-13 NOTE — Telephone Encounter (Signed)
Gave and printed appt sched and avs fo rpt for Aug and Sept °

## 2015-01-13 NOTE — Progress Notes (Signed)
[place]  [phone] Fax:([phone]) [place] Note   Patient Care Team: Elwyn Reach, MD as PCP - General (Internal Medicine) Autumn Messing III, MD as Consulting Physician (General Surgery) 01/13/2015  CHIEF COMPLAINTS:  Follow-up of breast cancer  Oncology History   Breast cancer of upper-outer quadrant of right female breast s/p Right MRM 10/30/14   Staging form: Breast, AJCC 7th Edition     Pathologic: Stage IIA (T1b, N1a, cM0) - Unsigned       Breast cancer of upper-outer quadrant of right female breast s/p Right MRM 10/30/14   06/03/2004 Cancer Diagnosis Left breast DCIS, status post mastectomy   06/06/2014 Imaging A mammogram and ultrasound showed a irregular 0.7 cm mass at the 12:30 clock position of right breast. Axillary nodes were negative.   08/12/2014 Receptors her2 ER 100% positive, PR 88% positive, HER-2 negative. Ki-67 19%.   08/12/2014 Initial Biopsy Right breast 12:30 o'clock biopsy showed invasive ductal carcinoma, grade 2.   10/30/2014 Surgery Right breast mastectomy and axillary node dissection, surgical margins were negative.    10/30/2014 Initial Diagnosis Breast cancer of upper-outer quadrant of right female breast s/p Right MRM 10/30/14   10/30/2014 Pathology Results Right breast radical mastectomy showed invasive ductal carcinoma, grade 1, tumor measuring 1.0 cm, margins were negative lymphovascular invasion (+), 1 sentinel lymph nodes positive, 15 additional axillary nodes negative.  (+) DCIS    12/30/2014 -  Chemotherapy Docetaxel 75 mg/m, Cytoxan 600 mg/m, every 3 weeks.     HISTORY OF PRESENTING ILLNESS:  Marissa Brooks 59 y.o. female is here because of recently diagnosed right breast cancer.   This was discovered by screening mammogram in January 2016. She did not have palpable breast mass or any other symptoms. Biopsy on 08/12/2014 showed invasive ductal carcinoma, ER/PR positive, HER-2 negative. She underwent right  breast mastectomy by Dr. Marcello Moores on 10/28/2014. Marland Kitchen   She has had some pain underneath her R armpit with some swelling. She reports that her "drain fell out" and was seen by Dr. Marlou Starks on 7/15 at which time a seroma was drained. Otherwise, she denies any problems with appetite, energy, pain. She does report having lost weight 195 lbs to 186 lbs though over an unspecified time interval. She is now menopausal with last menstrual period being sometime in her late 76s.  Per records obtained from [facility], she underwent left simple mastectomy on at [facility] on 07/16/04 after detection of multiple abnormalities in the left breast on mammography and was diagnosed with DCIS. On 05/27/05, she underwent placement of left tissue expander but declined additional reconstructive surgery. On 06/15/06, she was seen by Dr. Sophronia Simas for possible adjuvant tamoxifen and was recommended to undergo close surveillance. 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   She follows with Dr. Gala Romney for PCP care and was last seen by them prior to the surgery. She is currently undergoing substance abuse rehab. She reports that 1 pack of cigarettes lasts her a week and has not used cocaine for a month ago. She does report using heroin in the past. She also has history of bipolar for which she is on Abilify. She is widowed, has no children. She lives with a roommate who she has known for the last 10 years.  CURRENT THERAPY: Docetaxel 75 mg/m, Cytoxan 642m/m, every 3 weeks, started on 12/30/2014  INTERIM HISTORY KCharisereturns for follow up and toxicity check  up after first cycle chemo. She developed diarrhea for 2 days after chemo, mild nausea, but otherwise tolerated it well overall. No fever, or chills. Her appetite and energy levels are fairly well, she denies any other new symptoms.  MEDICAL HISTORY:  Past Medical History  Diagnosis Date  . Hypertension   . Shortness  of breath dyspnea     with walking  . Pneumonia   . Anxiety   . Depression   . Bipolar disorder   . GERD (gastroesophageal reflux disease)   . Arthritis   . Cancer 2002    left breast cancer tx in [place], right breast cancer 08/2014  . ETOH abuse     as of 10/21/14 none for a week  . Cocaine abuse     last use 10/13/14    SURGICAL HISTORY: Past Surgical History  Procedure Laterality Date  . Mastectomy      and Implant  . Mandible fracture surgery    . Breast surgery  2002    left mastectomy with implant  . Tonsillectomy    . Mastectomy w/ sentinel node biopsy Right 10/30/2014  . Simple mastectomy with axillary sentinel node biopsy Right 10/30/2014    Procedure: RIGHT MASTECTOMY WITH SENTINEL NODE MAPPING;  Surgeon: Autumn Messing III, MD;  Location: [facility];  Service: General;  Laterality: Right;    SOCIAL HISTORY: Social History   Social History  . Marital Status: Widowed    Spouse Name: N/A  . Number of Children: N/A  . Years of Education: N/A   Occupational History  . Not on file.   Social History Main Topics  . Smoking status: Current Every Day Smoker -- 0.25 packs/day    Types: Cigarettes  . Smokeless tobacco: Never Used  . Alcohol Use: Yes     Comment: was a heavy drinker (daily) none for a week (as of 10/21/14.  . Drug Use: Yes    Special: Cocaine, Marijuana     Comment: last use of cocaine ?10/13/14, last use of marijuana 3 weeks ago (as of 10/21/14)  . Sexual Activity: Not Currently    Birth Control/ Protection: Abstinence   Other Topics Concern  . Not on file   Social History Narrative    FAMILY HISTORY: Family History  Problem Relation Age of Onset  . Breast cancer Sister 9  . Mental illness Mother   . HIV/AIDS Sister     Deceased    ALLERGIES:  is allergic to centrum.  MEDICATIONS:  Current Outpatient Prescriptions  Medication Sig Dispense Refill  . ARIPiprazole (ABILIFY) 5 MG tablet Take 1 tablet (5 mg total) by mouth daily. For mood  control 30 tablet 0  . lisinopril (PRINIVIL,ZESTRIL) 10 MG tablet Take 10 mg by mouth daily.    . ondansetron (ZOFRAN) 8 MG tablet Take 1 tablet (8 mg total) by mouth 2 (two) times daily. Start the day after chemo for 3 days. Then take as needed for nausea or vomiting. 30 tablet 1  . oxyCODONE (OXY IR/ROXICODONE) 5 MG immediate release tablet Take 1-2 tablets (5-10 mg total) by mouth every 4 (four) hours as needed for moderate pain, severe pain or breakthrough pain. 40 tablet 0  . sertraline (ZOLOFT) 100 MG tablet Take 1 tablet (100 mg total) by mouth at bedtime. For depression 30 tablet 0  . traZODone (DESYREL) 100 MG tablet Take 1 tablet (100 mg total) by mouth at bedtime. For sleep (Patient taking differently: Take 100 mg by mouth at bedtime as needed.  For sleep) 30 tablet 0   No current facility-administered medications for this visit.    REVIEW OF SYSTEMS:   Constitutional: Denies fevers, chills or abnormal night sweats Eyes: Denies blurriness of vision, double vision or watery eyes Ears, nose, mouth, throat, and face: Denies mucositis or sore throat Respiratory: Denies cough, dyspnea or wheezes Cardiovascular: Denies palpitation, chest discomfort or lower extremity swelling Gastrointestinal:  Denies nausea, heartburn or change in bowel habits Skin: Denies abnormal skin rashes Lymphatics: Denies new lymphadenopathy or easy bruising Neurological:Denies numbness, tingling or new weaknesses Behavioral/Psych: Mood is stable, no new changes  All other systems were reviewed with the patient and are negative.  PHYSICAL EXAMINATION: ECOG PERFORMANCE STATUS: 0 - Asymptomatic  Filed Vitals:   01/13/15 0849  BP: 160/63  Pulse: 58  Temp: 98.6 F (37 C)  Resp: 18   Filed Weights   01/13/15 0845 01/13/15 0849  Weight: 185 lb 1.6 oz (83.961 kg) 185 lb 1.6 oz (83.961 kg)    GENERAL:alert, no distress and comfortable SKIN: skin color, texture, turgor are normal, no rashes or significant  lesions EYES: normal, conjunctiva are pink and non-injected, sclera clear OROPHARYNX:no exudate, no erythema and lips, buccal mucosa, and tongue normal  NECK: supple, thyroid normal size, non-tender, without nodularity LYMPH:  no palpable lymphadenopathy in the cervical, axillary or inguinal BREAST: s/p bilateral mastectomy, marked swelling noted just inferior the right axilla extending across to the front, surgical scars noted consistent with prior procedures. Exam of bilateral axilla revealed no palpable mass or adenopathy. LUNGS: clear to auscultation and percussion with normal breathing effort HEART: regular rate & rhythm and no murmurs and no lower extremity edema ABDOMEN:abdomen soft, non-tender and normal bowel sounds Musculoskeletal:no cyanosis of digits and no clubbing  PSYCH: alert & oriented x 3, some degree of cognitive impairment noted throughout the interview NEURO: no focal motor/sensory deficits  LABORATORY DATA:  I have reviewed the data as listed CBC Latest Ref Rng 01/13/2015 12/30/2014 12/22/2014  WBC 3.9 - 10.3 10e3/uL 10.1 4.7 5.8  Hemoglobin 11.6 - 15.9 g/dL 11.4(L) 12.5 12.3  Hematocrit 34.8 - 46.6 % 35.2 38.2 36.9  Platelets 145 - 400 10e3/uL 86(L) 135(L) 144(L)    CMP Latest Ref Rng 12/30/2014 12/22/2014 12/16/2014  Glucose 70 - 140 mg/dl 103 96 99  BUN 7.0 - 26.0 mg/dL 9.8 6.9(L) 15.9  Creatinine 0.6 - 1.1 mg/dL 0.8 0.8 0.8  Sodium 136 - 145 mEq/L 143 141 140  Potassium 3.5 - 5.1 mEq/L 4.2 4.0 4.5  Chloride 101 - 111 mmol/L - - -  CO2 22 - 29 mEq/L _0 Calcium 8.4 - 10.4 mg/dL 9.5 9.3 9.7  Total Protein 6.4 - 8.3 g/dL 7.2 7.4 7.4  Total Bilirubin 0.20 - 1.20 mg/dL 0.32 0.32 0.38  Alkaline Phos 40 - 150 U/L 79 75 74  AST 5 - 34 U/L 33 33 31  ALT 0 - 55 U/L 32 32 33    PATHOLOGY REPORT 10/30/2014 ADDITIONAL INFORMATION: 2. FLUORESCENCE IN-SITU HYBRIDIZATION  Results: HER2 - NEGATIVE RATIO OF HER2/CEP17 SIGNALS 1.62 AVERAGE HER2 COPY NUMBER PER CELL  2.10 Reference Range: NEGATIVE HER2/CEP17 Ratio <2.0 and average HER2 copy number <4.0 EQUIVOCAL HER2/CEP17 Ratio <2.0 and average HER2 copy number 4.0 and <6.0 POSITIVE HER2/CEP17 Ratio >=2.0 or <2.0 and average HER2 copy number >=6.0 Mali RUND DO Pathologist, Electronic Signature ( Signed 11/26/2014) 2. her2 This is NOT signed out FINAL DIAGNOSIS Diagnosis 1. Lymph node, sentinel, biopsy, Right #1 - ONE LYMPH  NODE, POSITIVE FOR METASTATIC MAMMARY CARCINOMA (1/1). - INTRANODAL TUMOR DEPOSIT IS 1.2 CM - POSITIVE FOR EXTRACAPSULAR TUMOR EXTENSION. 2. Breast, radical mastectomy (including lymph nodes), Right and axillary contents - INVASIVE DUCTAL CARCINOMA, SEE COMMENT. - POSITIVE FOR LYMPH VASCULAR INVASION. - DUCTAL CARCINOMA IN SITU WITH NECROSIS AND CALCIFICATIONS. - FIFTEEN LYMPH NODE S, NEGATIVE FOR TUMOR (0/15). - PREVIOUS BIOPSY SITE. - SEE TUMOR SYNOPTIC TEMPLATE BELOW  Microscopic Comment 2. BREAST, INVASIVE TUMOR, WITH LYMPH NODES PRESENT Specimen, including laterality and lymph node sampling (sentinel, non-sentinel): Right breast with sentinel lymph node sampling Procedure: Radical mastectomy Histologic type: Ductal Grade: I of III Tubule formation: 2 Nuclear pleomorphism: 2 Mitotic: 1 Tumor size (gross measurement): 1.0 cm Margins: Invasive, distance to closest margin: 1.8 cm (posterior) In-situ, distance to closest margin: 1.8 cm (posterior) If margin positive, focally or broadly: N/A Lymphovascular invasion: Present Ductal carcinoma in situ: Present Grade: 2 of 3 Extensive intraductal component: Absent Lobular neoplasia: Absent Tumor focality: Unifocal Treatment effect: None If present, treatment effect in breast tissue, lymph nodes or both: N/A Extent of tumor: Skin: N/A Nipple: N/A Skeletal muscle: N/A Lymph nodes: Examined: 1 Sentinel 15 Non-sentinel 16 Total Lymph nodes with metastasis: 1 Isolated tumor cells (< 0.2 mm): 0 Micrometastasis: (>  0.2 mm and < 2.0 mm): 0 Macrometastasis: (> 2.0 mm): x 1 Extracapsular extension: Present Breast prognostic profile: Estrogen receptor: Not repeated, previous study demonstrated 100% positivity ([id number]) Progesterone receptor: Not repeated, previous study demonstrated 88% positivity ([id number]) Her 2 neu: Repeated, previous study demonstrated no amplification ([id number]) Ki-67: Not repeated, previous study demonstrated 19% proliferation rate ([id number]) Non-neoplastic breast: Previous biopsy site, fibrocystic change, columnar cell change, sclerosing adenosis, benign adenosis and calcifications TNM: p T1b, pN1a, pMX   RADIOGRAPHIC STUDIES: I have personally reviewed the radiological images as listed and agreed with the findings in the report. No results found.  ASSESSMENT & PLAN:  Ms. Beevers is a 59 year old female with h/o left DCIS s/p mastectomy who presents with R invasive ductal carcinoma s/p mastectomy  1. Stage IIa, pT1bpN1a,M0, right invasive ductal carcinoma s/p mastectomy, ER+/PR+/HER- , G1 -Discussed with the patient the results of her surgical path and her cancer staging -The natural history of breast cancer were reviewed with her, and moderate risk of cancer recurrence after her surgery, giving the note positive disease. -Giving the note positive disease, I recommend adjuvant chemotherapy with docetaxel and Cytoxan every 3 weeks for 4 cycles. -She tolerated the first cycle chemotherapy relatively well. Lab reviewed, moderate thrombocytopenia, no clinical signs of bleeding, I tell her not to use ibuprofen or other NSIADs I will also refer her to radiation oncology to consider adjuvant irradiation -Giving the strong ER/PR positivity of her tumor, I would recommend adjuvant aromatase inhibitor to reduce her risk of cancer recurrence after she completes radiation.  2. Genetics -Giving her asthma history of breast cancer twice, and family history (sister) of breast cancer,  I'll refer her to see a genetic counser -She does not have children, but does have sisters.  2. History of cirrhosis and HCV infection -No imaging or additional workup found in our system though noted to have undergone treatment for HCV in 1999 at [facility] per note by Aurora Mask  [06/02/09] -Most recent LFTs reassuring -She still has very high HCV load, will refer her to liver clinic when she completes adjuvant chemo   3. Polysubstance abuse: Recently hospitalized in February 2016 for substance-induced mood disorder. -Continue to encourage patient to continue with substance  rehab and avoid illicit substances -She agrees to quit alcohol and illicit drug (cocane) completely when she is on chemo, she will working on smoking cessation also -Her urine test was still positive for cocaine on August 9, we discussed cocaine cessation again, and she agrees to stop it completely  4. Hypertension:  -Continue follow-up with her primary care physician. -She has been slightly on high  5. Mood disorder: She does not follow with a psychiatrist and reports her PCP prescribes all of her psychiatric medications.  -Defer to PCP  6. Thrombocytopenia -She had mild thrombocytopenia before chemotherapy, possible related to her liver disease -Moderate thrombocytopenia with platelet count 86k today, secondary to chemotherapy. I'll watch it carefully.  Plan -She will return on August 30 for second cycle TC, lab and see me before chemo    I have seen the patient, examined her. I agree with the assessment and and plan and have edited the notes.   Truitt Merle  01/13/2015

## 2015-01-14 ENCOUNTER — Ambulatory Visit: Payer: Self-pay

## 2015-01-16 ENCOUNTER — Other Ambulatory Visit: Payer: Self-pay

## 2015-01-16 ENCOUNTER — Ambulatory Visit: Payer: Self-pay

## 2015-01-17 ENCOUNTER — Ambulatory Visit: Payer: Self-pay

## 2015-01-20 ENCOUNTER — Other Ambulatory Visit (HOSPITAL_BASED_OUTPATIENT_CLINIC_OR_DEPARTMENT_OTHER): Payer: Medicare HMO

## 2015-01-20 ENCOUNTER — Encounter: Payer: Self-pay | Admitting: Hematology

## 2015-01-20 ENCOUNTER — Ambulatory Visit (HOSPITAL_BASED_OUTPATIENT_CLINIC_OR_DEPARTMENT_OTHER): Payer: Medicare HMO

## 2015-01-20 ENCOUNTER — Encounter: Payer: Self-pay | Admitting: *Deleted

## 2015-01-20 ENCOUNTER — Ambulatory Visit (HOSPITAL_BASED_OUTPATIENT_CLINIC_OR_DEPARTMENT_OTHER): Payer: Medicare HMO | Admitting: Hematology

## 2015-01-20 ENCOUNTER — Telehealth: Payer: Self-pay | Admitting: Hematology

## 2015-01-20 ENCOUNTER — Telehealth: Payer: Self-pay | Admitting: *Deleted

## 2015-01-20 VITALS — BP 135/68 | HR 57 | Temp 98.8°F | Resp 17 | Ht 62.0 in | Wt 180.8 lb

## 2015-01-20 DIAGNOSIS — Z720 Tobacco use: Secondary | ICD-10-CM

## 2015-01-20 DIAGNOSIS — I10 Essential (primary) hypertension: Secondary | ICD-10-CM

## 2015-01-20 DIAGNOSIS — D696 Thrombocytopenia, unspecified: Secondary | ICD-10-CM

## 2015-01-20 DIAGNOSIS — C50211 Malignant neoplasm of upper-inner quadrant of right female breast: Secondary | ICD-10-CM

## 2015-01-20 DIAGNOSIS — Z5111 Encounter for antineoplastic chemotherapy: Secondary | ICD-10-CM

## 2015-01-20 DIAGNOSIS — F319 Bipolar disorder, unspecified: Secondary | ICD-10-CM | POA: Diagnosis not present

## 2015-01-20 DIAGNOSIS — C50411 Malignant neoplasm of upper-outer quadrant of right female breast: Secondary | ICD-10-CM

## 2015-01-20 DIAGNOSIS — F1910 Other psychoactive substance abuse, uncomplicated: Secondary | ICD-10-CM

## 2015-01-20 DIAGNOSIS — Z853 Personal history of malignant neoplasm of breast: Secondary | ICD-10-CM

## 2015-01-20 DIAGNOSIS — Z803 Family history of malignant neoplasm of breast: Secondary | ICD-10-CM

## 2015-01-20 DIAGNOSIS — Z8619 Personal history of other infectious and parasitic diseases: Secondary | ICD-10-CM

## 2015-01-20 LAB — CBC & DIFF AND RETIC
BASO%: 1.8 % (ref 0.0–2.0)
Basophils Absolute: 0.1 10*3/uL (ref 0.0–0.1)
EOS ABS: 0.1 10*3/uL (ref 0.0–0.5)
EOS%: 1.3 % (ref 0.0–7.0)
HEMATOCRIT: 35.2 % (ref 34.8–46.6)
HEMOGLOBIN: 11.8 g/dL (ref 11.6–15.9)
Immature Retic Fract: 7.4 % (ref 1.60–10.00)
LYMPH%: 30.7 % (ref 14.0–49.7)
MCH: 28.9 pg (ref 25.1–34.0)
MCHC: 33.5 g/dL (ref 31.5–36.0)
MCV: 86.1 fL (ref 79.5–101.0)
MONO#: 0.7 10*3/uL (ref 0.1–0.9)
MONO%: 11.5 % (ref 0.0–14.0)
NEUT%: 54.7 % (ref 38.4–76.8)
NEUTROS ABS: 3.4 10*3/uL (ref 1.5–6.5)
Platelets: 143 10*3/uL — ABNORMAL LOW (ref 145–400)
RBC: 4.09 10*6/uL (ref 3.70–5.45)
RDW: 14.6 % — AB (ref 11.2–14.5)
RETIC %: 3.13 % — AB (ref 0.70–2.10)
RETIC CT ABS: 128.02 10*3/uL — AB (ref 33.70–90.70)
WBC: 6.3 10*3/uL (ref 3.9–10.3)
lymph#: 1.9 10*3/uL (ref 0.9–3.3)

## 2015-01-20 LAB — COMPREHENSIVE METABOLIC PANEL (CC13)
ALT: 32 U/L (ref 0–55)
ANION GAP: 7 meq/L (ref 3–11)
AST: 31 U/L (ref 5–34)
Albumin: 3.6 g/dL (ref 3.5–5.0)
Alkaline Phosphatase: 70 U/L (ref 40–150)
BUN: 9.9 mg/dL (ref 7.0–26.0)
CALCIUM: 9.2 mg/dL (ref 8.4–10.4)
CO2: 21 mEq/L — ABNORMAL LOW (ref 22–29)
Chloride: 112 mEq/L — ABNORMAL HIGH (ref 98–109)
Creatinine: 0.8 mg/dL (ref 0.6–1.1)
EGFR: >90 mL/min/{1.73_m2} (ref >90)
Glucose: 87 mg/dl (ref 70–140)
POTASSIUM: 3.9 meq/L (ref 3.5–5.1)
Sodium: 140 mEq/L (ref 136–145)
Total Bilirubin: 0.34 mg/dL (ref 0.20–1.20)
Total Protein: 7 g/dL (ref 6.4–8.3)

## 2015-01-20 MED ORDER — DEXAMETHASONE 4 MG PO TABS: ORAL_TABLET | ORAL | Status: DC

## 2015-01-20 MED ORDER — SODIUM CHLORIDE 0.9 % IV SOLN
Freq: Once | INTRAVENOUS | Status: AC
  Administered 2015-01-20: 12:00:00 via INTRAVENOUS

## 2015-01-20 MED ORDER — SODIUM CHLORIDE 0.9 % IV SOLN
600.0000 mg/m2 | Freq: Once | INTRAVENOUS | Status: AC
  Administered 2015-01-20: 1160 mg via INTRAVENOUS
  Filled 2015-01-20: qty 58

## 2015-01-20 MED ORDER — DOCETAXEL CHEMO INJECTION 160 MG/16ML
75.0000 mg/m2 | Freq: Once | INTRAVENOUS | Status: AC
  Administered 2015-01-20: 150 mg via INTRAVENOUS
  Filled 2015-01-20: qty 15

## 2015-01-20 MED ORDER — SODIUM CHLORIDE 0.9 % IV SOLN
Freq: Once | INTRAVENOUS | Status: AC
  Administered 2015-01-20: 13:00:00 via INTRAVENOUS
  Filled 2015-01-20: qty 8

## 2015-01-20 NOTE — Progress Notes (Signed)
[place] [facility]  Clinical Social Work contacted [employer] [facility] to schedule transportation for patients upcoming appointments.  ACS is currently looking for drivers for the following appointments: 9/20, 9/22, 10/11  ACS will contact CSW once transportation/drivers are confirmed.  Johnnye Lana, MSW, LCSW, OSW-C Clinical Social Worker [facility] [phone]

## 2015-01-20 NOTE — Telephone Encounter (Signed)
Per staff message and POF I have scheduled appts. Advised scheduler of appts. JMW  

## 2015-01-20 NOTE — Progress Notes (Signed)
[facility]  [phone] Fax:([phone] [place] Note   Patient Care Team: Elwyn Reach, MD as PCP - General (Internal Medicine) Autumn Messing III, MD as Consulting Physician (General Surgery) 01/20/2015  CHIEF COMPLAINTS:  Follow-up of breast cancer  Oncology History   Breast cancer of upper-outer quadrant of right female breast s/p Right MRM 10/30/14   Staging form: Breast, AJCC 7th Edition     Pathologic: Stage IIA (T1b, N1a, cM0) - Unsigned       Breast cancer of upper-outer quadrant of right female breast s/p Right MRM 10/30/14   06/03/2004 Cancer Diagnosis Left breast DCIS, status post mastectomy   06/06/2014 Imaging A mammogram and ultrasound showed a irregular 0.7 cm mass at the 12:30 clock position of right breast. Axillary nodes were negative.   08/12/2014 Receptors her2 ER 100% positive, PR 88% positive, HER-2 negative. Ki-67 19%.   08/12/2014 Initial Biopsy Right breast 12:30 o'clock biopsy showed invasive ductal carcinoma, grade 2.   10/30/2014 Surgery Right breast mastectomy and axillary node dissection, surgical margins were negative.    10/30/2014 Initial Diagnosis Breast cancer of upper-outer quadrant of right female breast s/p Right MRM 10/30/14   10/30/2014 Pathology Results Right breast radical mastectomy showed invasive ductal carcinoma, grade 1, tumor measuring 1.0 cm, margins were negative lymphovascular invasion (+), 1 sentinel lymph nodes positive, 15 additional axillary nodes negative.  (+) DCIS    12/30/2014 -  Chemotherapy Docetaxel 75 mg/m, Cytoxan 600 mg/m, every 3 weeks.     HISTORY OF PRESENTING ILLNESS:  Marissa Brooks 59 y.o. female is here because of recently diagnosed right breast cancer.   This was discovered by screening mammogram in January 2016. She did not have palpable breast mass or any other symptoms. Biopsy on 08/12/2014 showed invasive ductal carcinoma, ER/PR positive, HER-2 negative. She underwent right  breast mastectomy by Dr. Marcello Moores on 10/28/2014. Marland Kitchen   She has had some pain underneath her R armpit with some swelling. She reports that her "drain fell out" and was seen by Dr. Marlou Starks on 7/15 at which time a seroma was drained. Otherwise, she denies any problems with appetite, energy, pain. She does report having lost weight 195 lbs to 186 lbs though over an unspecified time interval. She is now menopausal with last menstrual period being sometime in her late 60s.  Per records obtained from [facility], she underwent left simple mastectomy on at [facility] on 07/16/04 after detection of multiple abnormalities in the left breast on mammography and was diagnosed with DCIS. On 05/27/05, she underwent placement of left tissue expander but declined additional reconstructive surgery. On 06/15/06, she was seen by Dr. Sophronia Simas for possible adjuvant tamoxifen and was recommended to undergo close surveillance. 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   She follows with Dr. Gala Romney for PCP care and was last seen by them prior to the surgery. She is currently undergoing substance abuse rehab. She reports that 1 pack of cigarettes lasts her a week and has not used cocaine for a month ago. She does report using heroin in the past. She also has history of bipolar for which she is on Abilify. She is widowed, has no children. She lives with a roommate who she has known for the last 10 years.  CURRENT THERAPY: Docetaxel 75 mg/m, Cytoxan 615m/m, every 3 weeks, started on 12/30/2014  INTERIM HISTORY [place] for follow up and  Second  cycle chemotherapy. She had mild diarrhea for a few days after the first cycle chemotherapy, otherwise tolerated treatment well. Appetite and energy level has not been affected much by chemo.  She did lose all her hair. No  Nausea, pain, or other symptoms.  She states that she has been trying to avoid any cocaine or other  Illicit  drugs, but did use once last week.   She do not take the dexamethasone for the first cycle chemotherapy.   MEDICAL HISTORY:  Past Medical History  Diagnosis Date  . Hypertension   . Shortness of breath dyspnea     with walking  . Pneumonia   . Anxiety   . Depression   . Bipolar disorder   . GERD (gastroesophageal reflux disease)   . Arthritis   . Cancer 2002    left breast cancer tx in [place], right breast cancer 08/2014  . ETOH abuse     as of 10/21/14 none for a week  . Cocaine abuse     last use 10/13/14    SURGICAL HISTORY: Past Surgical History  Procedure Laterality Date  . Mastectomy      and Implant  . Mandible fracture surgery    . Breast surgery  2002    left mastectomy with implant  . Tonsillectomy    . Mastectomy w/ sentinel node biopsy Right 10/30/2014  . Simple mastectomy with axillary sentinel node biopsy Right 10/30/2014    Procedure: RIGHT MASTECTOMY WITH SENTINEL NODE MAPPING;  Surgeon: Autumn Messing III, MD;  Location: [facility];  Service: General;  Laterality: Right;    SOCIAL HISTORY: Social History   Social History  . Marital Status: Widowed    Spouse Name: N/A  . Number of Children: N/A  . Years of Education: N/A   Occupational History  . Not on file.   Social History Main Topics  . Smoking status: Current Every Day Smoker -- 0.25 packs/day    Types: Cigarettes  . Smokeless tobacco: Never Used  . Alcohol Use: Yes     Comment: was a heavy drinker (daily) none for a week (as of 10/21/14.  . Drug Use: Yes    Special: Cocaine, Marijuana     Comment: last use of cocaine ?10/13/14, last use of marijuana 3 weeks ago (as of 10/21/14)  . Sexual Activity: Not Currently    Birth Control/ Protection: Abstinence   Other Topics Concern  . Not on file   Social History Narrative    FAMILY HISTORY: Family History  Problem Relation Age of Onset  . Breast cancer Sister 76  . Mental illness Mother   . HIV/AIDS Sister     Deceased    ALLERGIES:  is  allergic to centrum.  MEDICATIONS:  Current Outpatient Prescriptions  Medication Sig Dispense Refill  . ARIPiprazole (ABILIFY) 5 MG tablet Take 1 tablet (5 mg total) by mouth daily. For mood control 30 tablet 0  . lisinopril (PRINIVIL,ZESTRIL) 10 MG tablet Take 10 mg by mouth daily.    . ondansetron (ZOFRAN) 8 MG tablet Take 1 tablet (8 mg total) by mouth 2 (two) times daily. Start the day after chemo for 3 days. Then take as needed for nausea or vomiting. 30 tablet 1  . oxyCODONE (OXY IR/ROXICODONE) 5 MG immediate release tablet Take 1-2 tablets (5-10 mg total) by mouth every 4 (four) hours as needed for moderate pain, severe pain or breakthrough pain. 40 tablet 0  . sertraline (ZOLOFT) 100 MG tablet Take 1 tablet (100  mg total) by mouth at bedtime. For depression 30 tablet 0  . traZODone (DESYREL) 100 MG tablet Take 1 tablet (100 mg total) by mouth at bedtime. For sleep (Patient taking differently: Take 100 mg by mouth at bedtime as needed. For sleep) 30 tablet 0  . dexamethasone (DECADRON) 4 MG tablet Take 2 tab daily the day before chemo, and daily after chemo for 3 days 8 tablet 2   No current facility-administered medications for this visit.    REVIEW OF SYSTEMS:   Constitutional: Denies fevers, chills or abnormal night sweats Eyes: Denies blurriness of vision, double vision or watery eyes Ears, nose, mouth, throat, and face: Denies mucositis or sore throat Respiratory: Denies cough, dyspnea or wheezes Cardiovascular: Denies palpitation, chest discomfort or lower extremity swelling Gastrointestinal:  Denies nausea, heartburn or change in bowel habits Skin: Denies abnormal skin rashes Lymphatics: Denies new lymphadenopathy or easy bruising Neurological:Denies numbness, tingling or new weaknesses Behavioral/Psych: Mood is stable, no new changes  All other systems were reviewed with the patient and are negative.  PHYSICAL EXAMINATION: ECOG PERFORMANCE STATUS: 0 -  Asymptomatic  Filed Vitals:   01/20/15 1050  BP: 135/68  Pulse: 57  Temp: 98.8 F (37.1 C)  Resp: 17   Filed Weights   01/20/15 1050  Weight: 180 lb 12.8 oz (82.01 kg)    GENERAL:alert, no distress and comfortable SKIN: skin color, texture, turgor are normal, no rashes or significant lesions EYES: normal, conjunctiva are pink and non-injected, sclera clear OROPHARYNX:no exudate, no erythema and lips, buccal mucosa, and tongue normal  NECK: supple, thyroid normal size, non-tender, without nodularity LYMPH:  no palpable lymphadenopathy in the cervical, axillary or inguinal BREAST: s/p bilateral mastectomy, the swelling noted just inferior the right axilla extending across to the front has improved, exam of bilateral axilla revealed no palpable mass or adenopathy. LUNGS: clear to auscultation and percussion with normal breathing effort HEART: regular rate & rhythm and no murmurs and no lower extremity edema ABDOMEN:abdomen soft, non-tender and normal bowel sounds Musculoskeletal:no cyanosis of digits and no clubbing  PSYCH: alert & oriented x 3 NEURO: no focal motor/sensory deficits  LABORATORY DATA:  I have reviewed the data as listed CBC Latest Ref Rng 01/20/2015 01/13/2015 12/30/2014  WBC 3.9 - 10.3 10e3/uL 6.3 10.1 4.7  Hemoglobin 11.6 - 15.9 g/dL 11.8 11.4(L) 12.5  Hematocrit 34.8 - 46.6 % 35.2 35.2 38.2  Platelets 145 - 400 10e3/uL 143(L) 86(L) 135(L)    CMP Latest Ref Rng 01/20/2015 01/13/2015 12/30/2014  Glucose 70 - 140 mg/dl 87 123 103  BUN 7.0 - 26.0 mg/dL 9.9 7.4 9.8  Creatinine 0.6 - 1.1 mg/dL 0.8 0.8 0.8  Sodium 136 - 145 mEq/L 140 144 143  Potassium 3.5 - 5.1 mEq/L 3.9 3.8 4.2  Chloride 101 - 111 mmol/L - - -  CO2 22 - 29 mEq/L 21(L) 26 26  Calcium 8.4 - 10.4 mg/dL 9.2 8.9 9.5  Total Protein 6.4 - 8.3 g/dL 7.0 6.4 7.2  Total Bilirubin 0.20 - 1.20 mg/dL 0.34 0.30 0.32  Alkaline Phos 40 - 150 U/L 70 81 79  AST 5 - 34 U/L 31 31 33  ALT 0 - 55 U/L 32 35 32     PATHOLOGY REPORT 10/30/2014 ADDITIONAL INFORMATION: 2. FLUORESCENCE IN-SITU HYBRIDIZATION  Results: HER2 - NEGATIVE RATIO OF HER2/CEP17 SIGNALS 1.62 AVERAGE HER2 COPY NUMBER PER CELL 2.10 Reference Range: NEGATIVE HER2/CEP17 Ratio <2.0 and average HER2 copy number <4.0 EQUIVOCAL HER2/CEP17 Ratio <2.0 and average HER2 copy  number 4.0 and <6.0 POSITIVE HER2/CEP17 Ratio >=2.0 or <2.0 and average HER2 copy number >=6.0 Mali RUND DO Pathologist, Electronic Signature ( Signed 11/26/2014) 2. her2 This is NOT signed out FINAL DIAGNOSIS Diagnosis 1. Lymph node, sentinel, biopsy, Right #1 - ONE LYMPH NODE, POSITIVE FOR METASTATIC MAMMARY CARCINOMA (1/1). - INTRANODAL TUMOR DEPOSIT IS 1.2 CM - POSITIVE FOR EXTRACAPSULAR TUMOR EXTENSION. 2. Breast, radical mastectomy (including lymph nodes), Right and axillary contents - INVASIVE DUCTAL CARCINOMA, SEE COMMENT. - POSITIVE FOR LYMPH VASCULAR INVASION. - DUCTAL CARCINOMA IN SITU WITH NECROSIS AND CALCIFICATIONS. - FIFTEEN LYMPH NODE S, NEGATIVE FOR TUMOR (0/15). - PREVIOUS BIOPSY SITE. - SEE TUMOR SYNOPTIC TEMPLATE BELOW  Microscopic Comment 2. BREAST, INVASIVE TUMOR, WITH LYMPH NODES PRESENT Specimen, including laterality and lymph node sampling (sentinel, non-sentinel): Right breast with sentinel lymph node sampling Procedure: Radical mastectomy Histologic type: Ductal Grade: I of III Tubule formation: 2 Nuclear pleomorphism: 2 Mitotic: 1 Tumor size (gross measurement): 1.0 cm Margins: Invasive, distance to closest margin: 1.8 cm (posterior) In-situ, distance to closest margin: 1.8 cm (posterior) If margin positive, focally or broadly: N/A Lymphovascular invasion: Present Ductal carcinoma in situ: Present Grade: 2 of 3 Extensive intraductal component: Absent Lobular neoplasia: Absent Tumor focality: Unifocal Treatment effect: None If present, treatment effect in breast tissue, lymph nodes or both: N/A Extent of  tumor: Skin: N/A Nipple: N/A Skeletal muscle: N/A Lymph nodes: Examined: 1 Sentinel 15 Non-sentinel 16 Total Lymph nodes with metastasis: 1 Isolated tumor cells (< 0.2 mm): 0 Micrometastasis: (> 0.2 mm and < 2.0 mm): 0 Macrometastasis: (> 2.0 mm): x 1 Extracapsular extension: Present Breast prognostic profile: Estrogen receptor: Not repeated, previous study demonstrated 100% positivity ([id number]) Progesterone receptor: Not repeated, previous study demonstrated 88% positivity ([id number]) Her 2 neu: Repeated, previous study demonstrated no amplification ([id number]) Ki-67: Not repeated, previous study demonstrated 19% proliferation rate ([id number]) Non-neoplastic breast: Previous biopsy site, fibrocystic change, columnar cell change, sclerosing adenosis, benign adenosis and calcifications TNM: p T1b, pN1a, pMX   RADIOGRAPHIC STUDIES: I have personally reviewed the radiological images as listed and agreed with the findings in the report. No results found.  ASSESSMENT & PLAN:  Ms. Cones is a 59 year old female with h/o left DCIS s/p mastectomy who presents with R invasive ductal carcinoma s/p mastectomy  1. Stage IIa, pT1bpN1a,M0, right invasive ductal carcinoma s/p mastectomy, ER+/PR+/HER- , G1 -Discussed with the patient the results of her surgical path and her cancer staging -The natural history of breast cancer were reviewed with her, and moderate risk of cancer recurrence after her surgery, giving the note positive disease. -Giving the note positive disease, I recommend adjuvant chemotherapy with docetaxel and Cytoxan every 3 weeks for 4 cycles. -She tolerated the first cycle chemotherapy relatively well. - lab reviewed, adequate for treatment, we'll proceed second cycle chemotherapy today. -I will also refer her to radiation oncology to consider adjuvant irradiation after chemo -Giving the strong ER/PR positivity of her tumor, I would recommend adjuvant aromatase inhibitor  to reduce her risk of cancer recurrence after she completes radiation.  2. Genetics -Giving her asthma history of breast cancer twice, and family history (sister) of breast cancer, I'll refer her to see a genetic counser -She does not have children, but does have sisters.  2. History of cirrhosis and HCV infection -No imaging or additional workup found in our system though noted to have undergone treatment for HCV in [facility] at [facility] per note by Aurora Mask  [06/02/09] -Most recent LFTs reassuring -  She still has very high HCV load, will refer her to liver clinic when she completes adjuvant chemo   3. Polysubstance abuse: Recently hospitalized in February 2016 for substance-induced mood disorder. -Continue to encourage patient to continue with substance rehab and avoid illicit substances -She agrees to quit alcohol and illicit drug (cocane) completely when she is on chemo, she will working on smoking cessation also -Her urine test was still positive for cocaine on August 9, we discussed cocaine cessation again, and she agrees to stop it completely  4. Hypertension:  -Continue follow-up with her primary care physician. -She has been slightly on high  5. Mood disorder: She does not follow with a psychiatrist and reports her PCP prescribes all of her psychiatric medications.  -Defer to PCP  6. Thrombocytopenia -She had mild thrombocytopenia before chemotherapy, possible related to her liver disease -  Nadir platelet 86 after first round of chemotherapy,recovered well today   Plan - second cycle chemotherapy TC today - I called in dexamethasone a milligram daily,  Before and 3 days after chemotherapy, she knows to start it tomorrow - return to clinic in 3 weeks for third cycle   Truitt Merle  01/20/2015

## 2015-01-20 NOTE — Telephone Encounter (Signed)
per pof to sch pt appt-sent MW emailto sch pt trmt-pt to get updated sch b4 leaving-pt aware of appt

## 2015-01-22 ENCOUNTER — Ambulatory Visit (HOSPITAL_BASED_OUTPATIENT_CLINIC_OR_DEPARTMENT_OTHER): Payer: Medicare HMO

## 2015-01-22 VITALS — BP 125/67 | HR 55 | Temp 98.2°F

## 2015-01-22 DIAGNOSIS — C50211 Malignant neoplasm of upper-inner quadrant of right female breast: Secondary | ICD-10-CM

## 2015-01-22 DIAGNOSIS — Z5189 Encounter for other specified aftercare: Secondary | ICD-10-CM

## 2015-01-22 DIAGNOSIS — Z853 Personal history of malignant neoplasm of breast: Secondary | ICD-10-CM

## 2015-01-22 DIAGNOSIS — C50411 Malignant neoplasm of upper-outer quadrant of right female breast: Secondary | ICD-10-CM

## 2015-01-22 LAB — URINE DRUGS OF ABUSE SCREEN W ALC, ROUTINE (REF LAB)
Amphetamine Screen, Ur: NEGATIVE
BARBITURATE QUANT UR: NEGATIVE
Benzodiazepines.: NEGATIVE
Cocaine Metabolites: POSITIVE — AB
Creatinine,U: 61.7 mg/dL
MARIJUANA METABOLITE: NEGATIVE
METHADONE: NEGATIVE
Opiate Screen, Urine: NEGATIVE
Phencyclidine (PCP): NEGATIVE
Propoxyphene: NEGATIVE

## 2015-01-22 MED ORDER — PEGFILGRASTIM INJECTION 6 MG/0.6ML ~~LOC~~
6.0000 mg | PREFILLED_SYRINGE | Freq: Once | SUBCUTANEOUS | Status: AC
  Administered 2015-01-22: 6 mg via SUBCUTANEOUS
  Filled 2015-01-22: qty 0.6

## 2015-01-27 ENCOUNTER — Emergency Department (HOSPITAL_COMMUNITY)
Admission: EM | Admit: 2015-01-27 | Discharge: 2015-01-27 | Disposition: A | Discharge status: Home / Self Care | Payer: Medicare HMO | Attending: Emergency Medicine | Admitting: Emergency Medicine

## 2015-01-27 ENCOUNTER — Encounter (HOSPITAL_COMMUNITY): Payer: Self-pay | Admitting: *Deleted

## 2015-01-27 DIAGNOSIS — R42 Dizziness and giddiness: Secondary | ICD-10-CM | POA: Diagnosis not present

## 2015-01-27 DIAGNOSIS — K644 Residual hemorrhoidal skin tags: Secondary | ICD-10-CM | POA: Insufficient documentation

## 2015-01-27 DIAGNOSIS — Z8739 Personal history of other diseases of the musculoskeletal system and connective tissue: Secondary | ICD-10-CM | POA: Insufficient documentation

## 2015-01-27 DIAGNOSIS — I10 Essential (primary) hypertension: Secondary | ICD-10-CM | POA: Diagnosis not present

## 2015-01-27 DIAGNOSIS — Z8719 Personal history of other diseases of the digestive system: Secondary | ICD-10-CM | POA: Diagnosis not present

## 2015-01-27 DIAGNOSIS — R197 Diarrhea, unspecified: Secondary | ICD-10-CM | POA: Diagnosis not present

## 2015-01-27 DIAGNOSIS — Z79899 Other long term (current) drug therapy: Secondary | ICD-10-CM | POA: Diagnosis not present

## 2015-01-27 DIAGNOSIS — Z8701 Personal history of pneumonia (recurrent): Secondary | ICD-10-CM | POA: Insufficient documentation

## 2015-01-27 DIAGNOSIS — Z853 Personal history of malignant neoplasm of breast: Secondary | ICD-10-CM | POA: Diagnosis not present

## 2015-01-27 DIAGNOSIS — Z720 Tobacco use: Secondary | ICD-10-CM | POA: Insufficient documentation

## 2015-01-27 DIAGNOSIS — R531 Weakness: Secondary | ICD-10-CM | POA: Diagnosis present

## 2015-01-27 LAB — BASIC METABOLIC PANEL
ANION GAP: 8 (ref 5–15)
BUN: 10 mg/dL (ref 6–20)
CHLORIDE: 103 mmol/L (ref 101–111)
CO2: 25 mmol/L (ref 22–32)
Calcium: 9.3 mg/dL (ref 8.9–10.3)
Creatinine, Ser: 0.63 mg/dL (ref 0.44–1.00)
GFR calc Af Amer: >60 mL/min (ref >60)
GFR calc non Af Amer: >60 mL/min (ref >60)
GLUCOSE: 96 mg/dL (ref 65–99)
POTASSIUM: 4 mmol/L (ref 3.5–5.1)
Sodium: 136 mmol/L (ref 135–145)

## 2015-01-27 LAB — CBC WITH DIFFERENTIAL/PLATELET
BAND NEUTROPHILS: 0 % (ref 0–10)
BASOS PCT: 0 % (ref 0–1)
Basophils Absolute: 0 10*3/uL (ref 0.0–0.1)
Blasts: 0 %
EOS ABS: 0.5 10*3/uL (ref 0.0–0.7)
Eosinophils Relative: 5 % (ref 0–5)
HCT: 35.6 % — ABNORMAL LOW (ref 36.0–46.0)
HEMOGLOBIN: 11.9 g/dL — AB (ref 12.0–15.0)
LYMPHS PCT: 32 % (ref 12–46)
Lymphs Abs: 3.1 10*3/uL (ref 0.7–4.0)
MCH: 28.4 pg (ref 26.0–34.0)
MCHC: 33.4 g/dL (ref 30.0–36.0)
MCV: 85 fL (ref 78.0–100.0)
MONO ABS: 1.6 10*3/uL — AB (ref 0.1–1.0)
MONOS PCT: 16 % — AB (ref 3–12)
Metamyelocytes Relative: 3 %
Myelocytes: 2 %
NEUTROS ABS: 4.5 10*3/uL (ref 1.7–7.7)
NRBC: 0 /100{WBCs}
Neutrophils Relative %: 40 % — ABNORMAL LOW (ref 43–77)
OTHER: 0 %
PLATELETS: 151 10*3/uL (ref 150–400)
Promyelocytes Absolute: 2 %
RBC: 4.19 MIL/uL (ref 3.87–5.11)
RDW: 14.3 % (ref 11.5–15.5)
WBC: 9.7 10*3/uL (ref 4.0–10.5)

## 2015-01-27 LAB — POC OCCULT BLOOD, ED: FECAL OCCULT BLD: NEGATIVE

## 2015-01-27 MED ORDER — SODIUM CHLORIDE 0.9 % IV BOLUS (SEPSIS)
1000.0000 mL | Freq: Once | INTRAVENOUS | Status: AC
  Administered 2015-01-27: 1000 mL via INTRAVENOUS

## 2015-01-27 NOTE — ED Notes (Signed)
Bed: LO75 Expected date: 01/27/15 Expected time: 11:44 AM Means of arrival: Ambulance Comments: EMS [place] pt; diarrhea

## 2015-01-27 NOTE — ED Notes (Signed)
Breast ca patient, last chemo on Friday, did well over weekend. Today profound weakness and diarrhea. No pain  118/60 HR 80

## 2015-01-27 NOTE — ED Provider Notes (Signed)
CSN: [id number]     Arrival date & time 01/27/15  1153 History   First MD Initiated Contact with Patient 01/27/15 1311     Chief Complaint  Patient presents with  . Weakness     (Consider location/radiation/quality/duration/timing/severity/associated sxs/prior Treatment) HPI  59 year old female with a history of breast cancer being treated with chemotherapy presents with diarrhea and lightheadedness. All the symptoms started this morning. Patient has had 2 loose stools, small amount of blood both in the toilet as well as when she wipes. No abdominal pain or rectal pain. Patient felt well last night. No fevers or chills. Last chemotherapy was 2 weeks ago. No vomiting. Patient feels lightheaded when sitting or standing, not as bad laying down. No chest pain or shortness of breath.  Past Medical History  Diagnosis Date  . Hypertension   . Shortness of breath dyspnea     with walking  . Pneumonia   . Anxiety   . Depression   . Bipolar disorder   . GERD (gastroesophageal reflux disease)   . Arthritis   . Cancer 2002    left breast cancer tx in [place], right breast cancer 08/2014  . ETOH abuse     as of 10/21/14 none for a week  . Cocaine abuse     last use 10/13/14   Past Surgical History  Procedure Laterality Date  . Mastectomy      and Implant  . Mandible fracture surgery    . Breast surgery  2002    left mastectomy with implant  . Tonsillectomy    . Mastectomy w/ sentinel node biopsy Right 10/30/2014  . Simple mastectomy with axillary sentinel node biopsy Right 10/30/2014    Procedure: RIGHT MASTECTOMY WITH SENTINEL NODE MAPPING;  Surgeon: Autumn Messing III, MD;  Location: [facility];  Service: General;  Laterality: Right;   Family History  Problem Relation Age of Onset  . Breast cancer Sister 27  . Mental illness Mother   . HIV/AIDS Sister     Deceased   Social History  Substance Use Topics  . Smoking status: Current Every Day Smoker -- 0.25 packs/day    Types: Cigarettes   . Smokeless tobacco: Never Used  . Alcohol Use: Yes     Comment: was a heavy drinker (daily) none for a week (as of 10/21/14.   OB History    No data available     Review of Systems  Constitutional: Negative for fever.  Respiratory: Negative for shortness of breath.   Cardiovascular: Negative for chest pain.  Gastrointestinal: Positive for diarrhea and blood in stool. Negative for nausea and vomiting.  Neurological: Positive for weakness and light-headedness.  All other systems reviewed and are negative.     Allergies  Centrum  Home Medications   Prior to Admission medications   Medication Sig Start Date End Date Taking? Authorizing Provider  ARIPiprazole (ABILIFY) 5 MG tablet Take 1 tablet (5 mg total) by mouth daily. For mood control 07/12/13   Encarnacion Slates, NP  dexamethasone (DECADRON) 4 MG tablet Take 2 tab daily the day before chemo, and daily after chemo for 3 days 01/20/15   Truitt Merle, MD  lisinopril (PRINIVIL,ZESTRIL) 10 MG tablet Take 10 mg by mouth daily.    Historical Provider, MD  ondansetron (ZOFRAN) 8 MG tablet Take 1 tablet (8 mg total) by mouth 2 (two) times daily. Start the day after chemo for 3 days. Then take as needed for nausea or vomiting. 12/08/14   Krista Blue  Burr Medico, MD  oxyCODONE (OXY IR/ROXICODONE) 5 MG immediate release tablet Take 1-2 tablets (5-10 mg total) by mouth every 4 (four) hours as needed for moderate pain, severe pain or breakthrough pain. 10/31/14   Jackolyn Confer, MD  sertraline (ZOLOFT) 100 MG tablet Take 1 tablet (100 mg total) by mouth at bedtime. For depression 07/12/13   Encarnacion Slates, NP  traZODone (DESYREL) 100 MG tablet Take 1 tablet (100 mg total) by mouth at bedtime. For sleep Patient taking differently: Take 100 mg by mouth at bedtime as needed. For sleep 07/12/13   Encarnacion Slates, NP   BP 113/61 mmHg  Pulse 74  Temp(Src) 98.4 F (36.9 C) (Oral)  Resp 18  SpO2 96% Physical Exam  Constitutional: She is oriented to person, place, and time.  She appears well-developed and well-nourished.  HENT:  Head: Normocephalic and atraumatic.  Right Ear: External ear normal.  Left Ear: External ear normal.  Nose: Nose normal.  Eyes: Right eye exhibits no discharge. Left eye exhibits no discharge.  Cardiovascular: Normal rate, regular rhythm and normal heart sounds.   Pulmonary/Chest: Effort normal and breath sounds normal.  Abdominal: Soft. There is no tenderness.  Genitourinary: Rectal exam shows external hemorrhoid (small, nonthrombosed). Rectal exam shows no internal hemorrhoid, no fissure, no mass and no tenderness. Guaiac negative stool.  Neurological: She is alert and oriented to person, place, and time.  CN 2-12 grossly intact. 5/5 strength in all 4 extremities. Grossly normal sensation. Normal finger to nose  Skin: Skin is warm and dry.  Nursing note and vitals reviewed.   ED Course  Procedures (including critical care time) Labs Review Labs Reviewed  CBC WITH DIFFERENTIAL/PLATELET - Abnormal; Notable for the following:    Hemoglobin 11.9 (*)    HCT 35.6 (*)    Neutrophils Relative % 40 (*)    Monocytes Relative 16 (*)    Monocytes Absolute 1.6 (*)    All other components within normal limits  BASIC METABOLIC PANEL  POC OCCULT BLOOD, ED    Imaging Review No results found. I have personally reviewed and evaluated these images and lab results as part of my medical decision-making.   EKG Interpretation   Date/Time:  Tuesday January 27 2015 13:33:17 EDT Ventricular Rate:  76 PR Interval:  105 QRS Duration: 88 QT Interval:  363 QTC Calculation: 408 R Axis:   -13 Text Interpretation:  Sinus rhythm Short PR interval nonspecific T waves  Confirmed by Jacques Willingham  MD, Juliza Machnik ([id number]) on 01/27/2015 1:41:08 PM      MDM   Final diagnoses:  Diarrhea  Lightheadedness    Patient's lightheadedness is most likely due to the diarrhea she has had. Her neuro exam is normal. EKG shows no immediate cause for lightheadedness or  cardiac cause of near-syncope. Seems to be a fluid issue, when given IV fluid she feels much better. No blood on rectal exam, likely has a small tear or hemorrhoid causing the small amount of bloody stools she was having. Hemoglobin is stable. Follow-up closely with her oncologist.    Sherwood Gambler, MD 01/27/15 [phone]

## 2015-01-28 LAB — PATHOLOGIST SMEAR REVIEW

## 2015-01-29 ENCOUNTER — Telehealth: Payer: Self-pay | Admitting: *Deleted

## 2015-01-29 NOTE — Telephone Encounter (Signed)
I called pt's friend Karena Addison back. According to [facility], who called her yesterday, she was eating better. Karena Addison stated that she is using cocain again. She will check on her tomorrow, and call us if she still has diarrhea or other symptoms, we can see her if needed.  Truitt Merle  01/29/2015

## 2015-01-29 NOTE — Telephone Encounter (Signed)
Friend of pt, Dee, who comes with pt for tx, left VM, would like an update of some sort; states pt was in the ED this week and has experienced some sickness. Wanted to know when pt's next chemo is.

## 2015-02-02 ENCOUNTER — Other Ambulatory Visit: Payer: Self-pay

## 2015-02-02 ENCOUNTER — Ambulatory Visit: Payer: Self-pay

## 2015-02-09 ENCOUNTER — Other Ambulatory Visit: Payer: Self-pay

## 2015-02-09 ENCOUNTER — Ambulatory Visit: Payer: Self-pay | Admitting: Hematology

## 2015-02-10 ENCOUNTER — Encounter: Payer: Self-pay | Admitting: Hematology

## 2015-02-10 ENCOUNTER — Other Ambulatory Visit (HOSPITAL_BASED_OUTPATIENT_CLINIC_OR_DEPARTMENT_OTHER): Payer: Medicare HMO

## 2015-02-10 ENCOUNTER — Telehealth: Payer: Self-pay | Admitting: Hematology

## 2015-02-10 ENCOUNTER — Ambulatory Visit (HOSPITAL_BASED_OUTPATIENT_CLINIC_OR_DEPARTMENT_OTHER): Payer: Medicare HMO

## 2015-02-10 ENCOUNTER — Ambulatory Visit (HOSPITAL_BASED_OUTPATIENT_CLINIC_OR_DEPARTMENT_OTHER): Payer: Medicare HMO | Admitting: Hematology

## 2015-02-10 VITALS — BP 133/69 | HR 71 | Temp 99.1°F | Resp 18 | Ht 62.0 in | Wt 180.9 lb

## 2015-02-10 DIAGNOSIS — C50211 Malignant neoplasm of upper-inner quadrant of right female breast: Secondary | ICD-10-CM | POA: Diagnosis not present

## 2015-02-10 DIAGNOSIS — C50411 Malignant neoplasm of upper-outer quadrant of right female breast: Secondary | ICD-10-CM

## 2015-02-10 DIAGNOSIS — Z5111 Encounter for antineoplastic chemotherapy: Secondary | ICD-10-CM | POA: Diagnosis not present

## 2015-02-10 LAB — COMPREHENSIVE METABOLIC PANEL (CC13)
ALBUMIN: 3.3 g/dL — AB (ref 3.5–5.0)
ALT: 32 U/L (ref 0–55)
ANION GAP: 5 meq/L (ref 3–11)
AST: 33 U/L (ref 5–34)
Alkaline Phosphatase: 56 U/L (ref 40–150)
BILIRUBIN TOTAL: 0.32 mg/dL (ref 0.20–1.20)
BUN: 9.8 mg/dL (ref 7.0–26.0)
CALCIUM: 8.7 mg/dL (ref 8.4–10.4)
CO2: 23 mEq/L (ref 22–29)
CREATININE: 0.8 mg/dL (ref 0.6–1.1)
Chloride: 111 mEq/L — ABNORMAL HIGH (ref 98–109)
EGFR: >90 mL/min/{1.73_m2} (ref >90)
Glucose: 85 mg/dl (ref 70–140)
Potassium: 4.4 mEq/L (ref 3.5–5.1)
Sodium: 139 mEq/L (ref 136–145)
TOTAL PROTEIN: 6.6 g/dL (ref 6.4–8.3)

## 2015-02-10 LAB — CBC & DIFF AND RETIC
BASO%: 1.3 % (ref 0.0–2.0)
BASOS ABS: 0.1 10*3/uL (ref 0.0–0.1)
EOS%: 0.9 % (ref 0.0–7.0)
Eosinophils Absolute: 0 10*3/uL (ref 0.0–0.5)
HEMATOCRIT: 31.8 % — AB (ref 34.8–46.6)
HEMOGLOBIN: 10.5 g/dL — AB (ref 11.6–15.9)
Immature Retic Fract: 8.6 % (ref 1.60–10.00)
LYMPH%: 28.5 % (ref 14.0–49.7)
MCH: 28.8 pg (ref 25.1–34.0)
MCHC: 33 g/dL (ref 31.5–36.0)
MCV: 87.1 fL (ref 79.5–101.0)
MONO#: 0.5 10*3/uL (ref 0.1–0.9)
MONO%: 10.8 % (ref 0.0–14.0)
NEUT%: 58.5 % (ref 38.4–76.8)
NEUTROS ABS: 2.6 10*3/uL (ref 1.5–6.5)
Platelets: 145 10*3/uL (ref 145–400)
RBC: 3.65 10*6/uL — ABNORMAL LOW (ref 3.70–5.45)
RDW: 17.2 % — AB (ref 11.2–14.5)
RETIC %: 4.4 % — AB (ref 0.70–2.10)
Retic Ct Abs: 160.6 10*3/uL — ABNORMAL HIGH (ref 33.70–90.70)
WBC: 4.5 10*3/uL (ref 3.9–10.3)
lymph#: 1.3 10*3/uL (ref 0.9–3.3)

## 2015-02-10 MED ORDER — SODIUM CHLORIDE 0.9 % IV SOLN
Freq: Once | INTRAVENOUS | Status: AC
  Administered 2015-02-10: 12:00:00 via INTRAVENOUS
  Filled 2015-02-10: qty 8

## 2015-02-10 MED ORDER — DOCETAXEL CHEMO INJECTION 160 MG/16ML
75.0000 mg/m2 | Freq: Once | INTRAVENOUS | Status: AC
  Administered 2015-02-10: 150 mg via INTRAVENOUS
  Filled 2015-02-10: qty 15

## 2015-02-10 MED ORDER — SODIUM CHLORIDE 0.9 % IV SOLN
600.0000 mg/m2 | Freq: Once | INTRAVENOUS | Status: AC
  Administered 2015-02-10: 1160 mg via INTRAVENOUS
  Filled 2015-02-10: qty 58

## 2015-02-10 MED ORDER — DEXAMETHASONE 4 MG PO TABS
ORAL_TABLET | ORAL | Status: DC
Start: 2015-02-10 — End: 2015-07-03

## 2015-02-10 MED ORDER — SODIUM CHLORIDE 0.9 % IV SOLN
Freq: Once | INTRAVENOUS | Status: AC
  Administered 2015-02-10: 12:00:00 via INTRAVENOUS

## 2015-02-10 NOTE — Telephone Encounter (Signed)
Gave and printed avs °

## 2015-02-10 NOTE — Progress Notes (Signed)
[facility]  [phone] [phone]) [phone]  Clinic Follow Up Note   Patient Care Team: Elwyn Reach, MD as PCP - General (Internal Medicine) Autumn Messing III, MD as Consulting Physician (General Surgery) 02/10/2015  CHIEF COMPLAINTS:  Follow-up of breast cancer  Oncology History   Breast cancer of upper-outer quadrant of right female breast s/p Right MRM 10/30/14   Staging form: Breast, AJCC 7th Edition     Pathologic: Stage IIA (T1b, N1a, cM0) - Unsigned       Breast cancer of upper-outer quadrant of right female breast s/p Right MRM 10/30/14   06/03/2004 Cancer Diagnosis Left breast DCIS, status post mastectomy   06/06/2014 Imaging A mammogram and ultrasound showed a irregular 0.7 cm mass at the 12:30 clock position of right breast. Axillary nodes were negative.   08/12/2014 Receptors her2 ER 100% positive, PR 88% positive, HER-2 negative. Ki-67 19%.   08/12/2014 Initial Biopsy Right breast 12:30 o'clock biopsy showed invasive ductal carcinoma, grade 2.   10/30/2014 Surgery Right breast mastectomy and axillary node dissection, surgical margins were negative.    10/30/2014 Initial Diagnosis Breast cancer of upper-outer quadrant of right female breast s/p Right MRM 10/30/14   10/30/2014 Pathology Results Right breast radical mastectomy showed invasive ductal carcinoma, grade 1, tumor measuring 1.0 cm, margins were negative lymphovascular invasion (+), 1 sentinel lymph nodes positive, 15 additional axillary nodes negative.  (+) DCIS    12/30/2014 -  Chemotherapy Docetaxel 75 mg/m, Cytoxan 600 mg/m, every 3 weeks.     HISTORY OF PRESENTING ILLNESS:  Marissa Brooks 59 y.o. female is here because of recently diagnosed right breast cancer.   This was discovered by screening mammogram in January 2016. She did not have palpable breast mass or any other symptoms. Biopsy on 08/12/2014 showed invasive ductal carcinoma, ER/PR positive, HER-2 negative. She underwent right breast  mastectomy by Dr. Marcello Moores on 10/28/2014. Marissa Brooks Kitchen   She has had some pain underneath her R armpit with some swelling. She reports that her "drain fell out" and was seen by Dr. Marlou Starks on 7/15 at which time a seroma was drained. Otherwise, she denies any problems with appetite, energy, pain. She does report having lost weight 195 lbs to 186 lbs though over an unspecified time interval. She is now menopausal with last menstrual period being sometime in her late 39s.  Per records obtained from [facility], she underwent left simple mastectomy on at [facility] on 07/16/04 after detection of multiple abnormalities in the left breast on mammography and was diagnosed with DCIS. On 05/27/05, she underwent placement of left tissue expander but declined additional reconstructive surgery. On 06/15/06, she was seen by Dr. Sophronia Simas for possible adjuvant tamoxifen and was recommended to undergo close surveillance. 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   She follows with Dr. Gala Romney for PCP care and was last seen by them prior to the surgery. She is currently undergoing substance abuse rehab. She reports that 1 pack of cigarettes lasts her a week and has not used cocaine for a month ago. She does report using heroin in the past. She also has history of bipolar for which she is on Abilify. She is widowed, has no children. She lives with a roommate who she has known for the last 10 years.  CURRENT THERAPY: Docetaxel 75 mg/m, Cytoxan 688m/m, every 3 weeks, started on 12/30/2014  INTERIM HISTORY KFondareturns for follow up and  Third  cycle chemotherapy. She tolerated the last treatment very well, she denies significant nausea, diarrhea, pain, or other symptoms after chemotherapy. No fever or chills. She is eating well. She overall lost about 5 pounds since she started the chemotherapy. She has some skin rash at the chemotherapy infusion site on left arm, which has  resolved.  MEDICAL HISTORY:  Past Medical History  Diagnosis Date  . Hypertension   . Shortness of breath dyspnea     with walking  . Pneumonia   . Anxiety   . Depression   . Bipolar disorder   . GERD (gastroesophageal reflux disease)   . Arthritis   . Cancer 2002    left breast cancer tx in [place], right breast cancer 08/2014  . ETOH abuse     as of 10/21/14 none for a week  . Cocaine abuse     last use 10/13/14    SURGICAL HISTORY: Past Surgical History  Procedure Laterality Date  . Mastectomy      and Implant  . Mandible fracture surgery    . Breast surgery  2002    left mastectomy with implant  . Tonsillectomy    . Mastectomy w/ sentinel node biopsy Right 10/30/2014  . Simple mastectomy with axillary sentinel node biopsy Right 10/30/2014    Procedure: RIGHT MASTECTOMY WITH SENTINEL NODE MAPPING;  Surgeon: Autumn Messing III, MD;  Location: [facility];  Service: General;  Laterality: Right;    SOCIAL HISTORY: Social History   Social History  . Marital Status: Widowed    Spouse Name: N/A  . Number of Children: N/A  . Years of Education: N/A   Occupational History  . Not on file.   Social History Main Topics  . Smoking status: Current Every Day Smoker -- 0.25 packs/day    Types: Cigarettes  . Smokeless tobacco: Never Used  . Alcohol Use: Yes     Comment: was a heavy drinker (daily) none for a week (as of 10/21/14.  . Drug Use: Yes    Special: Cocaine, Marijuana     Comment: last use of cocaine ?10/13/14, last use of marijuana 3 weeks ago (as of 10/21/14)  . Sexual Activity: Not Currently    Birth Control/ Protection: Abstinence   Other Topics Concern  . Not on file   Social History Narrative    FAMILY HISTORY: Family History  Problem Relation Age of Onset  . Breast cancer Sister 50  . Mental illness Mother   . HIV/AIDS Sister     Deceased    ALLERGIES:  is allergic to centrum.  MEDICATIONS:  Current Outpatient Prescriptions  Medication Sig Dispense  Refill  . ARIPiprazole (ABILIFY) 5 MG tablet Take 1 tablet (5 mg total) by mouth daily. For mood control 30 tablet 0  . lisinopril (PRINIVIL,ZESTRIL) 10 MG tablet Take 10 mg by mouth daily.    . naproxen (NAPROSYN) 500 MG tablet Take 500 mg by mouth 2 (two) times daily as needed for moderate pain.    Marissa Brooks Kitchen ondansetron (ZOFRAN) 8 MG tablet Take 1 tablet (8 mg total) by mouth 2 (two) times daily. Start the day after chemo for 3 days. Then take as needed for nausea or vomiting. 30 tablet 1  . oxyCODONE (OXY IR/ROXICODONE) 5 MG immediate release tablet Take 1-2 tablets (5-10 mg total) by mouth every 4 (four) hours as needed for moderate pain, severe pain or breakthrough pain. 40 tablet 0  . sertraline (ZOLOFT) 100 MG tablet Take 1 tablet (100 mg total) by  mouth at bedtime. For depression 30 tablet 0  . traZODone (DESYREL) 100 MG tablet Take 1 tablet (100 mg total) by mouth at bedtime. For sleep (Patient taking differently: Take 100 mg by mouth daily. For sleep) 30 tablet 0  . dexamethasone (DECADRON) 4 MG tablet Take 2 tab daily the day before chemo, and daily after chemo for 3 days 16 tablet 1   No current facility-administered medications for this visit.    REVIEW OF SYSTEMS:   Constitutional: Denies fevers, chills or abnormal night sweats Eyes: Denies blurriness of vision, double vision or watery eyes Ears, nose, mouth, throat, and face: Denies mucositis or sore throat Respiratory: Denies cough, dyspnea or wheezes Cardiovascular: Denies palpitation, chest discomfort or lower extremity swelling Gastrointestinal:  Denies nausea, heartburn or change in bowel habits Skin: Denies abnormal skin rashes Lymphatics: Denies new lymphadenopathy or easy bruising Neurological:Denies numbness, tingling or new weaknesses Behavioral/Psych: Mood is stable, no new changes  All other systems were reviewed with the patient and are negative.  PHYSICAL EXAMINATION: ECOG PERFORMANCE STATUS: 0 - Asymptomatic  Filed  Vitals:   02/10/15 1007  BP: 133/69  Pulse: 71  Temp: 99.1 F (37.3 C)  Resp: 18   Filed Weights   02/10/15 1007  Weight: 180 lb 14.4 oz (82.056 kg)    GENERAL:alert, no distress and comfortable SKIN: skin color, texture, turgor are normal, no rashes or significant lesions, except some healed skim rash/ulcers and pigmentation/color change on left front arm EYES: normal, conjunctiva are pink and non-injected, sclera clear OROPHARYNX:no exudate, no erythema and lips, buccal mucosa, and tongue normal  NECK: supple, thyroid normal size, non-tender, without nodularity LYMPH:  no palpable lymphadenopathy in the cervical, axillary or inguinal BREAST: s/p bilateral mastectomy, the swelling noted just inferior the right axilla extending across to the front is stable, exam of bilateral axilla revealed no palpable mass or adenopathy. LUNGS: clear to auscultation and percussion with normal breathing effort HEART: regular rate & rhythm and no murmurs and no lower extremity edema ABDOMEN:abdomen soft, non-tender and normal bowel sounds Musculoskeletal:no cyanosis of digits and no clubbing  PSYCH: alert & oriented x 3 NEURO: no focal motor/sensory deficits  LABORATORY DATA:  I have reviewed the data as listed CBC Latest Ref Rng 02/10/2015 01/27/2015 01/20/2015  WBC 3.9 - 10.3 10e3/uL 4.5 9.7 6.3  Hemoglobin 11.6 - 15.9 g/dL 10.5(L) 11.9(L) 11.8  Hematocrit 34.8 - 46.6 % 31.8(L) 35.6(L) 35.2  Platelets 145 - 400 10e3/uL 145 151 143(L)    CMP Latest Ref Rng 02/10/2015 01/27/2015 01/20/2015  Glucose 70 - 140 mg/dl 85 96 87  BUN 7.0 - 26.0 mg/dL 9.8 10 9.9  Creatinine 0.6 - 1.1 mg/dL 0.8 0.63 0.8  Sodium 136 - 145 mEq/L 139 136 140  Potassium 3.5 - 5.1 mEq/L 4.4 4.0 3.9  Chloride 101 - 111 mmol/L - 103 -  CO2 22 - 29 mEq/L 23 25 21(L)  Calcium 8.4 - 10.4 mg/dL 8.7 9.3 9.2  Total Protein 6.4 - 8.3 g/dL 6.6 - 7.0  Total Bilirubin 0.20 - 1.20 mg/dL 0.32 - 0.34  Alkaline Phos 40 - 150 U/L 56 - 70    AST 5 - 34 U/L 33 - 31  ALT 0 - 55 U/L 32 - 32    PATHOLOGY REPORT 10/30/2014 ADDITIONAL INFORMATION: 2. FLUORESCENCE IN-SITU HYBRIDIZATION  Results: HER2 - NEGATIVE RATIO OF HER2/CEP17 SIGNALS 1.62 AVERAGE HER2 COPY NUMBER PER CELL 2.10 Reference Range: NEGATIVE HER2/CEP17 Ratio <2.0 and average HER2 copy number <4.0 EQUIVOCAL  HER2/CEP17 Ratio <2.0 and average HER2 copy number 4.0 and <6.0 POSITIVE HER2/CEP17 Ratio >=2.0 or <2.0 and average HER2 copy number >=6.0 [place]i RUND DO Pathologist, Electronic Signature ( Signed 11/26/2014) 2. her2 This is NOT signed out FINAL DIAGNOSIS Diagnosis 1. Lymph node, sentinel, biopsy, Right #1 - ONE LYMPH NODE, POSITIVE FOR METASTATIC MAMMARY CARCINOMA (1/1). - INTRANODAL TUMOR DEPOSIT IS 1.2 CM - POSITIVE FOR EXTRACAPSULAR TUMOR EXTENSION. 2. Breast, radical mastectomy (including lymph nodes), Right and axillary contents - INVASIVE DUCTAL CARCINOMA, SEE COMMENT. - POSITIVE FOR LYMPH VASCULAR INVASION. - DUCTAL CARCINOMA IN SITU WITH NECROSIS AND CALCIFICATIONS. - FIFTEEN LYMPH NODE S, NEGATIVE FOR TUMOR (0/15). - PREVIOUS BIOPSY SITE. - SEE TUMOR SYNOPTIC TEMPLATE BELOW  Microscopic Comment 2. BREAST, INVASIVE TUMOR, WITH LYMPH NODES PRESENT Specimen, including laterality and lymph node sampling (sentinel, non-sentinel): Right breast with sentinel lymph node sampling Procedure: Radical mastectomy Histologic type: Ductal Grade: I of III Tubule formation: 2 Nuclear pleomorphism: 2 Mitotic: 1 Tumor size (gross measurement): 1.0 cm Margins: Invasive, distance to closest margin: 1.8 cm (posterior) In-situ, distance to closest margin: 1.8 cm (posterior) If margin positive, focally or broadly: N/A Lymphovascular invasion: Present Ductal carcinoma in situ: Present Grade: 2 of 3 Extensive intraductal component: Absent Lobular neoplasia: Absent Tumor focality: Unifocal Treatment effect: None If present, treatment effect in breast  tissue, lymph nodes or both: N/A Extent of tumor: Skin: N/A Nipple: N/A Skeletal muscle: N/A Lymph nodes: Examined: 1 Sentinel 15 Non-sentinel 16 Total Lymph nodes with metastasis: 1 Isolated tumor cells (< 0.2 mm): 0 Micrometastasis: (> 0.2 mm and < 2.0 mm): 0 Macrometastasis: (> 2.0 mm): x 1 Extracapsular extension: Present Breast prognostic profile: Estrogen receptor: Not repeated, previous study demonstrated 100% positivity ([id number]) Progesterone receptor: Not repeated, previous study demonstrated 88% positivity ([id number]) Her 2 neu: Repeated, previous study demonstrated no amplification ([id number]) Ki-67: Not repeated, previous study demonstrated 19% proliferation rate ([id number]) Non-neoplastic breast: Previous biopsy site, fibrocystic change, columnar cell change, sclerosing adenosis, benign adenosis and calcifications TNM: p T1b, pN1a, pMX   RADIOGRAPHIC STUDIES: I have personally reviewed the radiological images as listed and agreed with the findings in the report. No results found.  ASSESSMENT & PLAN:  Ms. Kupfer is a 59 year old female with h/o left DCIS s/p mastectomy who presents with R invasive ductal carcinoma s/p mastectomy  1. Stage IIa, pT1bpN1a,M0, right invasive ductal carcinoma s/p mastectomy, ER+/PR+/HER- , G1 -Discussed with the patient the results of her surgical path and her cancer staging -The natural history of breast cancer were reviewed with her, and moderate risk of cancer recurrence after her surgery, giving the note positive disease. -Giving the note positive disease, I recommend adjuvant chemotherapy with docetaxel and Cytoxan every 3 weeks for 4 cycles. -She tolerated the first two cycles chemotherapy relatively well. - lab reviewed, adequate for treatment, we'll proceed third cycle chemotherapy today. -I will also refer her to radiation oncology to consider adjuvant irradiation after chemo -Giving the strong ER/PR positivity of her tumor,  I would recommend adjuvant aromatase inhibitor to reduce her risk of cancer recurrence after she completes radiation.  2. Genetics -Giving her asthma history of breast cancer twice, and family history (sister) of breast cancer, I'll refer her to see a genetic counser, she is still thinking about it  -She does not have children, but does have sisters.  2. History of cirrhosis and HCV infection -No imaging or additional workup found in our system though noted to have undergone treatment for HCV in  1999 at [facility] per note by Aurora Mask  [06/02/09] -Most recent LFTs reassuring -She still has very high HCV load, will refer her to liver clinic when she completes adjuvant chemo   3. Polysubstance abuse: Recently hospitalized in February 2016 for substance-induced mood disorder. -Continue to encourage patient to continue with substance rehab and avoid illicit substances -She agrees to quit alcohol and illicit drug (cocane) completely when she is on chemo, she will working on smoking cessation also -Her urine test was still positive for cocaine on August 9, we discussed cocaine cessation again, and she agrees to stop it completely  4. Hypertension:  -Continue follow-up with her primary care physician. -She has been slightly on high  5. Mood disorder: She does not follow with a psychiatrist and reports her PCP prescribes all of her psychiatric medications.  -Defer to PCP  6. Left arm skin rash -Likely secondary to docetaxel infusion. Resolved for now. -I'll give her 100 mL normal saline flush after docetaxel.  -She knows to use topical steroids as needed.  Plan - third cycle chemotherapy TC today - I refilled dexamethasone today, she did not take yesterday, but will take it from tomorrow for 3 days  - return to clinic in 3 weeks for last cycle   Truitt Merle  02/10/2015

## 2015-02-10 NOTE — Patient Instructions (Signed)
[facility] Discharge Instructions for Patients Receiving Chemotherapy  Today you received the following chemotherapy agents taxotere, cytoxan  To help prevent nausea and vomiting after your treatment, we encourage you to take your nausea medication    If you develop nausea and vomiting that is not controlled by your nausea medication, call the clinic.   BELOW ARE SYMPTOMS THAT SHOULD BE REPORTED IMMEDIATELY:  *FEVER GREATER THAN 100.5 F  *CHILLS WITH OR WITHOUT FEVER  NAUSEA AND VOMITING THAT IS NOT CONTROLLED WITH YOUR NAUSEA MEDICATION  *UNUSUAL SHORTNESS OF BREATH  *UNUSUAL BRUISING OR BLEEDING  TENDERNESS IN MOUTH AND THROAT WITH OR WITHOUT PRESENCE OF ULCERS  *URINARY PROBLEMS  *BOWEL PROBLEMS  UNUSUAL RASH Items with * indicate a potential emergency and should be followed up as soon as possible.  Feel free to call the clinic you have any questions or concerns. The clinic phone number is [phone].  Please show the [facility] at check-in to the Emergency Department and triage nurse.

## 2015-02-12 ENCOUNTER — Other Ambulatory Visit: Payer: Self-pay | Admitting: Hematology

## 2015-02-12 ENCOUNTER — Ambulatory Visit (HOSPITAL_BASED_OUTPATIENT_CLINIC_OR_DEPARTMENT_OTHER): Payer: Medicare HMO

## 2015-02-12 VITALS — BP 119/68 | HR 72 | Temp 98.8°F

## 2015-02-12 DIAGNOSIS — C50211 Malignant neoplasm of upper-inner quadrant of right female breast: Secondary | ICD-10-CM

## 2015-02-12 DIAGNOSIS — Z5189 Encounter for other specified aftercare: Secondary | ICD-10-CM | POA: Diagnosis not present

## 2015-02-12 DIAGNOSIS — Z23 Encounter for immunization: Secondary | ICD-10-CM | POA: Diagnosis not present

## 2015-02-12 DIAGNOSIS — C50411 Malignant neoplasm of upper-outer quadrant of right female breast: Secondary | ICD-10-CM

## 2015-02-12 MED ORDER — INFLUENZA VAC SPLIT QUAD 0.5 ML IM SUSY
0.5000 mL | PREFILLED_SYRINGE | Freq: Once | INTRAMUSCULAR | Status: AC
  Administered 2015-02-12: 0.5 mL via INTRAMUSCULAR
  Filled 2015-02-12: qty 0.5

## 2015-02-12 MED ORDER — PEGFILGRASTIM INJECTION 6 MG/0.6ML ~~LOC~~
6.0000 mg | PREFILLED_SYRINGE | Freq: Once | SUBCUTANEOUS | Status: AC
  Administered 2015-02-12: 6 mg via SUBCUTANEOUS
  Filled 2015-02-12: qty 0.6

## 2015-02-21 ENCOUNTER — Emergency Department (HOSPITAL_COMMUNITY)
Admission: EM | Admit: 2015-02-21 | Discharge: 2015-02-22 | Disposition: A | Discharge status: Home / Self Care | Payer: Medicare HMO | Attending: Emergency Medicine | Admitting: Emergency Medicine

## 2015-02-21 ENCOUNTER — Emergency Department (HOSPITAL_COMMUNITY): Payer: Medicare HMO

## 2015-02-21 DIAGNOSIS — A5901 Trichomonal vulvovaginitis: Secondary | ICD-10-CM | POA: Diagnosis not present

## 2015-02-21 DIAGNOSIS — L0291 Cutaneous abscess, unspecified: Secondary | ICD-10-CM

## 2015-02-21 DIAGNOSIS — F1210 Cannabis abuse, uncomplicated: Secondary | ICD-10-CM | POA: Diagnosis not present

## 2015-02-21 DIAGNOSIS — I10 Essential (primary) hypertension: Secondary | ICD-10-CM | POA: Diagnosis not present

## 2015-02-21 DIAGNOSIS — F1010 Alcohol abuse, uncomplicated: Secondary | ICD-10-CM | POA: Insufficient documentation

## 2015-02-21 DIAGNOSIS — Z8701 Personal history of pneumonia (recurrent): Secondary | ICD-10-CM | POA: Diagnosis not present

## 2015-02-21 DIAGNOSIS — L02811 Cutaneous abscess of head [any part, except face]: Secondary | ICD-10-CM | POA: Insufficient documentation

## 2015-02-21 DIAGNOSIS — R55 Syncope and collapse: Secondary | ICD-10-CM | POA: Diagnosis not present

## 2015-02-21 DIAGNOSIS — F1410 Cocaine abuse, uncomplicated: Secondary | ICD-10-CM | POA: Insufficient documentation

## 2015-02-21 DIAGNOSIS — F319 Bipolar disorder, unspecified: Secondary | ICD-10-CM | POA: Diagnosis not present

## 2015-02-21 DIAGNOSIS — Z8719 Personal history of other diseases of the digestive system: Secondary | ICD-10-CM | POA: Diagnosis not present

## 2015-02-21 DIAGNOSIS — W01198A Fall on same level from slipping, tripping and stumbling with subsequent striking against other object, initial encounter: Secondary | ICD-10-CM | POA: Insufficient documentation

## 2015-02-21 DIAGNOSIS — R040 Epistaxis: Secondary | ICD-10-CM | POA: Insufficient documentation

## 2015-02-21 DIAGNOSIS — Y998 Other external cause status: Secondary | ICD-10-CM | POA: Diagnosis not present

## 2015-02-21 DIAGNOSIS — F419 Anxiety disorder, unspecified: Secondary | ICD-10-CM | POA: Diagnosis not present

## 2015-02-21 DIAGNOSIS — Z79899 Other long term (current) drug therapy: Secondary | ICD-10-CM | POA: Insufficient documentation

## 2015-02-21 DIAGNOSIS — Z043 Encounter for examination and observation following other accident: Secondary | ICD-10-CM | POA: Insufficient documentation

## 2015-02-21 DIAGNOSIS — E876 Hypokalemia: Secondary | ICD-10-CM | POA: Diagnosis not present

## 2015-02-21 DIAGNOSIS — Z853 Personal history of malignant neoplasm of breast: Secondary | ICD-10-CM | POA: Insufficient documentation

## 2015-02-21 DIAGNOSIS — Z720 Tobacco use: Secondary | ICD-10-CM | POA: Insufficient documentation

## 2015-02-21 DIAGNOSIS — R531 Weakness: Secondary | ICD-10-CM | POA: Diagnosis present

## 2015-02-21 DIAGNOSIS — A599 Trichomoniasis, unspecified: Secondary | ICD-10-CM

## 2015-02-21 DIAGNOSIS — Y92009 Unspecified place in unspecified non-institutional (private) residence as the place of occurrence of the external cause: Secondary | ICD-10-CM | POA: Diagnosis not present

## 2015-02-21 DIAGNOSIS — Z8739 Personal history of other diseases of the musculoskeletal system and connective tissue: Secondary | ICD-10-CM | POA: Insufficient documentation

## 2015-02-21 DIAGNOSIS — Y9389 Activity, other specified: Secondary | ICD-10-CM | POA: Diagnosis not present

## 2015-02-21 DIAGNOSIS — F1910 Other psychoactive substance abuse, uncomplicated: Secondary | ICD-10-CM

## 2015-02-21 LAB — URINALYSIS, ROUTINE W REFLEX MICROSCOPIC
Bilirubin Urine: NEGATIVE
GLUCOSE, UA: NEGATIVE mg/dL
HGB URINE DIPSTICK: NEGATIVE
Ketones, ur: NEGATIVE mg/dL
Nitrite: NEGATIVE
PH: 6.5 (ref 5.0–8.0)
Protein, ur: NEGATIVE mg/dL
SPECIFIC GRAVITY, URINE: 1.01 (ref 1.005–1.030)
Urobilinogen, UA: 1 mg/dL (ref 0.0–1.0)

## 2015-02-21 LAB — BASIC METABOLIC PANEL
ANION GAP: 8 (ref 5–15)
BUN: 9 mg/dL (ref 6–20)
CALCIUM: 8.3 mg/dL — AB (ref 8.9–10.3)
CHLORIDE: 108 mmol/L (ref 101–111)
CO2: 22 mmol/L (ref 22–32)
Creatinine, Ser: 0.8 mg/dL (ref 0.44–1.00)
GFR calc Af Amer: >60 mL/min (ref >60)
GFR calc non Af Amer: >60 mL/min (ref >60)
GLUCOSE: 123 mg/dL — AB (ref 65–99)
POTASSIUM: 3 mmol/L — AB (ref 3.5–5.1)
Sodium: 138 mmol/L (ref 135–145)

## 2015-02-21 LAB — CBC
HEMATOCRIT: 28.3 % — AB (ref 36.0–46.0)
HEMOGLOBIN: 9.6 g/dL — AB (ref 12.0–15.0)
MCH: 29.9 pg (ref 26.0–34.0)
MCHC: 33.9 g/dL (ref 30.0–36.0)
MCV: 88.2 fL (ref 78.0–100.0)
Platelets: 156 10*3/uL (ref 150–400)
RBC: 3.21 MIL/uL — ABNORMAL LOW (ref 3.87–5.11)
RDW: 18.2 % — ABNORMAL HIGH (ref 11.5–15.5)
WBC: 34.1 10*3/uL — ABNORMAL HIGH (ref 4.0–10.5)

## 2015-02-21 LAB — RAPID URINE DRUG SCREEN, HOSP PERFORMED
AMPHETAMINES: NOT DETECTED
BARBITURATES: NOT DETECTED
BENZODIAZEPINES: NOT DETECTED
COCAINE: POSITIVE — AB
Opiates: NOT DETECTED
Tetrahydrocannabinol: POSITIVE — AB

## 2015-02-21 LAB — URINE MICROSCOPIC-ADD ON

## 2015-02-21 LAB — CBG MONITORING, ED: Glucose-Capillary: 119 mg/dL — ABNORMAL HIGH (ref 65–99)

## 2015-02-21 LAB — I-STAT CG4 LACTIC ACID, ED: Lactic Acid, Venous: 0.45 mmol/L — ABNORMAL LOW (ref 0.5–2.0)

## 2015-02-21 LAB — TROPONIN I: Troponin I: <0.03 ng/mL (ref <0.031)

## 2015-02-21 LAB — ETHANOL

## 2015-02-21 MED ORDER — CLINDAMYCIN HCL 300 MG PO CAPS: 300.0000 mg | ORAL_CAPSULE | Freq: Four times a day (QID) | ORAL | Status: DC

## 2015-02-21 MED ORDER — LIDOCAINE-EPINEPHRINE (PF) 2 %-1:200000 IJ SOLN
10.0000 mL | Freq: Once | INTRAMUSCULAR | Status: AC
  Administered 2015-02-21: 10 mL

## 2015-02-21 MED ORDER — POTASSIUM CHLORIDE CRYS ER 20 MEQ PO TBCR
40.0000 meq | EXTENDED_RELEASE_TABLET | Freq: Once | ORAL | Status: AC
  Administered 2015-02-21: 40 meq via ORAL
  Filled 2015-02-21: qty 2

## 2015-02-21 MED ORDER — SODIUM CHLORIDE 0.9 % IV BOLUS (SEPSIS)
1000.0000 mL | Freq: Once | INTRAVENOUS | Status: AC
  Administered 2015-02-21: 1000 mL via INTRAVENOUS

## 2015-02-21 MED ORDER — LIDOCAINE-EPINEPHRINE (PF) 2 %-1:200000 IJ SOLN
INTRAMUSCULAR | Status: AC
  Administered 2015-02-21: 20 mL
  Filled 2015-02-21: qty 20

## 2015-02-21 MED ORDER — METRONIDAZOLE 500 MG PO TABS: 500.0000 mg | ORAL_TABLET | Freq: Two times a day (BID) | ORAL | Status: DC

## 2015-02-21 NOTE — ED Provider Notes (Signed)
CSN: [id number]     Arrival date & time 02/21/15  2022 History   First MD Initiated Contact with Patient 02/21/15 2112     No chief complaint on file.    (Consider location/radiation/quality/duration/timing/severity/associated sxs/prior Treatment) HPI Comments: Patient presents with syncopal episode. She has a history of breast cancer. Her last chemotherapy was about 2 weeks ago per her report. She also has a history of bipolar disorder in the past history of alcohol and cocaine abuse. She's currently in substance abuse treatment. She states that she was standing up to walk out of the house and she stood up she felt lightheaded and had a syncopal episode. She did hit her face and had some bleeding from her nose. She denies any headache. She denies any neck or back pain. She denies any other injuries from the fall. She denies any facial pain. She says her nose stopped bleeding shortly after it started. She denies any recent illnesses. She denies any cough congestion. There is no chest pain shortness breath or palpitations that preceded the syncope. She does state that she did drink a little bit of wine earlier today. She states it is only about half a glass. She also states that she smoked marijuana earlier today.   Past Medical History  Diagnosis Date  . Hypertension   . Shortness of breath dyspnea     with walking  . Pneumonia   . Anxiety   . Depression   . Bipolar disorder   . GERD (gastroesophageal reflux disease)   . Arthritis   . Cancer 2002    left breast cancer tx in [place], right breast cancer 08/2014  . ETOH abuse     as of 10/21/14 none for a week  . Cocaine abuse     last use 10/13/14   Past Surgical History  Procedure Laterality Date  . Mastectomy      and Implant  . Mandible fracture surgery    . Breast surgery  2002    left mastectomy with implant  . Tonsillectomy    . Mastectomy w/ sentinel node biopsy Right 10/30/2014  . Simple mastectomy with axillary  sentinel node biopsy Right 10/30/2014    Procedure: RIGHT MASTECTOMY WITH SENTINEL NODE MAPPING;  Surgeon: Autumn Messing III, MD;  Location: [facility];  Service: General;  Laterality: Right;   Family History  Problem Relation Age of Onset  . Breast cancer Sister 4  . Mental illness Mother   . HIV/AIDS Sister     Deceased   Social History  Substance Use Topics  . Smoking status: Current Every Day Smoker -- 0.25 packs/day    Types: Cigarettes  . Smokeless tobacco: Never Used  . Alcohol Use: Yes     Comment: was a heavy drinker (daily) none for a week (as of 10/21/14.   OB History    No data available     Review of Systems  Constitutional: Negative for fever, chills and diaphoresis.  HENT: Positive for nosebleeds. Negative for congestion, rhinorrhea and sneezing.   Eyes: Negative.   Respiratory: Negative for cough, chest tightness and shortness of breath.   Cardiovascular: Negative for chest pain and leg swelling.  Gastrointestinal: Negative for nausea, vomiting, abdominal pain, diarrhea and blood in stool.  Genitourinary: Negative for frequency, hematuria, flank pain and difficulty urinating.  Musculoskeletal: Negative for back pain and arthralgias.  Skin: Negative for rash.  Neurological: Positive for syncope and light-headedness. Negative for dizziness, speech difficulty, weakness, numbness and headaches.  Allergies  Centrum  Home Medications   Prior to Admission medications   Medication Sig Start Date End Date Taking? Authorizing Provider  ARIPiprazole (ABILIFY) 5 MG tablet Take 1 tablet (5 mg total) by mouth daily. For mood control 07/12/13  Yes Encarnacion Slates, NP  dexamethasone (DECADRON) 4 MG tablet Take 2 tab daily the day before chemo, and daily after chemo for 3 days 02/10/15  Yes Truitt Merle, MD  lisinopril (PRINIVIL,ZESTRIL) 10 MG tablet Take 10 mg by mouth daily.   Yes Historical Provider, MD  naproxen (NAPROSYN) 500 MG tablet Take 500 mg by mouth 2 (two) times daily as  needed for moderate pain.   Yes Historical Provider, MD  ondansetron (ZOFRAN) 8 MG tablet TAKE 1 TABLET (8 MG TOTAL) BY MOUTH TWO   (TWO) TIMES DAILY. START THE DAY AFTER CHEMO FOR THREE   DAYS. THEN TAKE AS NEEDED FOR NAUSEA OR V 02/13/15  Yes Truitt Merle, MD  sertraline (ZOLOFT) 100 MG tablet Take 1 tablet (100 mg total) by mouth at bedtime. For depression 07/12/13  Yes Encarnacion Slates, NP  traZODone (DESYREL) 100 MG tablet Take 1 tablet (100 mg total) by mouth at bedtime. For sleep Patient taking differently: Take 100 mg by mouth daily. For sleep 07/12/13  Yes Encarnacion Slates, NP  clindamycin (CLEOCIN) 300 MG capsule Take 1 capsule (300 mg total) by mouth 4 (four) times daily. X 7 days 02/21/15   Malvin Johns, MD  metroNIDAZOLE (FLAGYL) 500 MG tablet Take 1 tablet (500 mg total) by mouth 2 (two) times daily. One po bid x 7 days 02/21/15   Malvin Johns, MD  oxyCODONE (OXY IR/ROXICODONE) 5 MG immediate release tablet Take 1-2 tablets (5-10 mg total) by mouth every 4 (four) hours as needed for moderate pain, severe pain or breakthrough pain. Patient not taking: Reported on 02/21/2015 10/31/14   Jackolyn Confer, MD   BP 105/62 mmHg  Pulse 67  Temp(Src) 97.7 F (36.5 C) (Oral)  Resp 16  SpO2 99% Physical Exam  Constitutional: She is oriented to person, place, and time. She appears well-developed and well-nourished.  HENT:  Head: Normocephalic and atraumatic.  She has no bony tenderness to her face. There some dried blood in the nares but no septal hematoma. She does have a 2 cm fluctuant abscess to the left parietal scalp. There is no signs of surrounding cellulitis. No drainage. There is a scab over the top of the wound. She states this is been on for about 2 weeks and has been sore.  Eyes: Pupils are equal, round, and reactive to light.  Neck: Normal range of motion. Neck supple.  No pain along the cervical thoracic or lumbosacral spine  Cardiovascular: Normal rate, regular rhythm and normal heart  sounds.   Pulmonary/Chest: Effort normal and breath sounds normal. No respiratory distress. She has no wheezes. She has no rales. She exhibits no tenderness.  Abdominal: Soft. Bowel sounds are normal. There is no tenderness. There is no rebound and no guarding.  Musculoskeletal: Normal range of motion. She exhibits no edema.  Lymphadenopathy:    She has no cervical adenopathy.  Neurological: She is alert and oriented to person, place, and time. She has normal strength. No cranial nerve deficit or sensory deficit. GCS eye subscore is 4. GCS verbal subscore is 5. GCS motor subscore is 6.  Finger-nose intact, no pronator drift, gait normal  Skin: Skin is warm and dry. No rash noted.  Psychiatric: She has a normal mood  and affect.    ED Course  INCISION AND DRAINAGE Date/Time: 02/21/2015 10:16 PM Performed by: Marlisha Vanwyk Authorized by: Malvin Johns Consent: Verbal consent obtained. Risks and benefits: risks, benefits and alternatives were discussed Consent given by: patient Type: abscess Body area: head Location details: scalp Anesthesia: local infiltration Local anesthetic: lidocaine 1% with epinephrine Anesthetic total: 2 ml Patient sedated: no Scalpel size: 11 Incision type: elliptical Complexity: simple Drainage: purulent Drainage amount: moderate Wound treatment: wound left open Patient tolerance: Patient tolerated the procedure well with no immediate complications   (including critical care time) Labs Review Results for orders placed or performed during the hospital encounter of 41/74/08  Basic metabolic panel  Result Value Ref Range   Sodium 138 135 - 145 mmol/L   Potassium 3.0 (L) 3.5 - 5.1 mmol/L   Chloride 108 101 - 111 mmol/L   CO2 22 22 - 32 mmol/L   Glucose, Bld 123 (H) 65 - 99 mg/dL   BUN 9 6 - 20 mg/dL   Creatinine, Ser 0.80 0.44 - 1.00 mg/dL   Calcium 8.3 (L) 8.9 - 10.3 mg/dL   GFR calc non Af Amer >60 >60 mL/min   GFR calc Af Amer >60 >60 mL/min    Anion gap 8 5 - 15  CBC  Result Value Ref Range   WBC 34.1 (H) 4.0 - 10.5 K/uL   RBC 3.21 (L) 3.87 - 5.11 MIL/uL   Hemoglobin 9.6 (L) 12.0 - 15.0 g/dL   HCT 28.3 (L) 36.0 - 46.0 %   MCV 88.2 78.0 - 100.0 fL   MCH 29.9 26.0 - 34.0 pg   MCHC 33.9 30.0 - 36.0 g/dL   RDW 18.2 (H) 11.5 - 15.5 %   Platelets 156 150 - 400 K/uL  Urinalysis, Routine w reflex microscopic (not at [facility])  Result Value Ref Range   Color, Urine YELLOW YELLOW   APPearance CLOUDY (A) CLEAR   Specific Gravity, Urine 1.010 1.005 - 1.030   pH 6.5 5.0 - 8.0   Glucose, UA NEGATIVE NEGATIVE mg/dL   Hgb urine dipstick NEGATIVE NEGATIVE   Bilirubin Urine NEGATIVE NEGATIVE   Ketones, ur NEGATIVE NEGATIVE mg/dL   Protein, ur NEGATIVE NEGATIVE mg/dL   Urobilinogen, UA 1.0 0.0 - 1.0 mg/dL   Nitrite NEGATIVE NEGATIVE   Leukocytes, UA SMALL (A) NEGATIVE  Urine rapid drug screen (hosp performed)  Result Value Ref Range   Opiates NONE DETECTED NONE DETECTED   Cocaine POSITIVE (A) NONE DETECTED   Benzodiazepines NONE DETECTED NONE DETECTED   Amphetamines NONE DETECTED NONE DETECTED   Tetrahydrocannabinol POSITIVE (A) NONE DETECTED   Barbiturates NONE DETECTED NONE DETECTED  Ethanol  Result Value Ref Range   Alcohol, Ethyl (B) <5 <5 mg/dL  Troponin I  Result Value Ref Range   Troponin I <0.03 <0.031 ng/mL  Urine microscopic-add on  Result Value Ref Range   Squamous Epithelial / LPF MANY (A) RARE   WBC, UA 7-10 <3 WBC/hpf   RBC / HPF 0-2 <3 RBC/hpf   Bacteria, UA FEW (A) RARE   Urine-Other TRICHOMONAS PRESENT   CBG monitoring, ED  Result Value Ref Range   Glucose-Capillary 119 (H) 65 - 99 mg/dL  I-Stat CG4 Lactic Acid, ED  Result Value Ref Range   Lactic Acid, Venous 0.45 (L) 0.5 - 2.0 mmol/L   Dg Chest 2 View  02/21/2015   CLINICAL DATA:  Acute onset of generalized weakness. Status post fall. Initial encounter.  EXAM: CHEST  2 VIEW  COMPARISON:  Chest radiograph performed 10/21/2010  FINDINGS: The lungs are  well-aerated and clear. There is no evidence of focal opacification, pleural effusion or pneumothorax.  The heart is borderline normal in size. No acute osseous abnormalities are seen. Clips are seen at the right axilla.  IMPRESSION: No acute cardiopulmonary process seen.   Electronically Signed   By: Garald Balding M.D.   On: 02/21/2015 22:40      Imaging Review Dg Chest 2 View  02/21/2015   CLINICAL DATA:  Acute onset of generalized weakness. Status post fall. Initial encounter.  EXAM: CHEST  2 VIEW  COMPARISON:  Chest radiograph performed 10/21/2010  FINDINGS: The lungs are well-aerated and clear. There is no evidence of focal opacification, pleural effusion or pneumothorax.  The heart is borderline normal in size. No acute osseous abnormalities are seen. Clips are seen at the right axilla.  IMPRESSION: No acute cardiopulmonary process seen.   Electronically Signed   By: Garald Balding M.D.   On: 02/21/2015 22:40   I have personally reviewed and evaluated these images and lab results as part of my medical decision-making.   EKG Interpretation   Date/Time:  Saturday February 21 2015 20:47:21 EDT Ventricular Rate:  62 PR Interval:  118 QRS Duration: 78 QT Interval:  398 QTC Calculation: 404 R Axis:   -20 Text Interpretation:  Sinus rhythm Borderline short PR interval Borderline  left axis deviation since last tracing no significant change Confirmed by  Vihan Santagata  MD, Gabryella Murfin ([id number]) on 02/21/2015 10:21:12 PM      MDM   Final diagnoses:  Abscess  Vasovagal syncope  Substance abuse  Trichomonal infection    Patient presents after syncopal episode. It sounds as if it was a vasovagal type episode. She's completely asymptomatic now. She was given IV fluids in the ED. She has no symptoms that we more consistent with an arrhythmia. She's had no recent illnesses. She does have an abscess to her scalp it's been there about 2 weeks. It is tender to palpation and I did incise and drain this with  a large amount of purulent drainage. She has no underlying bony tenderness. She does have a white count of 32,000. Other than the abscess I don't see any other evidence of infection. She's nonfebrile. Her lactate is normal. There is no evidence of cellulitis around the abscess. Her urine doesn't look infected although she does have Trichomonas. Patient is asymptomatic now. She's been ambulating without difficulty. She has some mild hypokalemia which was replaced in the ED. She was hydrated. I will start her on clindamycin and Flagyl. I encouraged her to have close follow-up with her oncologist. Return precautions were given.    Malvin Johns, MD 02/22/15 0001

## 2015-02-21 NOTE — ED Notes (Signed)
Bed: WA01 Expected date:  Expected time:  Means of arrival:  Comments: EMS syncope 

## 2015-02-21 NOTE — ED Notes (Signed)
Vague weakness 3-4 hours ago. Fell 1 hour ago. She walked home after she fell from a neighbor's house. She called EMS. She is a cancer patient here. She has had a Rt. Mastectomy. Taking Chemotherapy but has not received treatment in 1-2 weeks. Her BP was low upon arrival for EMS, VS: BP: 82/48 to 110/54 P: 60 SpO2: 98% CBG: 112

## 2015-02-21 NOTE — ED Notes (Signed)
Pt admits to syncope episode, states she passed out for a few seconds. She was at a friends house. She was able to walk back home.

## 2015-02-21 NOTE — Discharge Instructions (Signed)
Abscess An abscess is an infected area that contains a collection of pus and debris.It can occur in almost any part of the body. An abscess is also known as a furuncle or boil. CAUSES  An abscess occurs when tissue gets infected. This can occur from blockage of oil or sweat glands, infection of hair follicles, or a minor injury to the skin. As the body tries to fight the infection, pus collects in the area and creates pressure under the skin. This pressure causes pain. People with weakened immune systems have difficulty fighting infections and get certain abscesses more often.  SYMPTOMS Usually an abscess develops on the skin and becomes a painful mass that is red, warm, and tender. If the abscess forms under the skin, you may feel a moveable soft area under the skin. Some abscesses break open (rupture) on their own, but most will continue to get worse without care. The infection can spread deeper into the body and eventually into the bloodstream, causing you to feel ill.  DIAGNOSIS  Your caregiver will take your medical history and perform a physical exam. A sample of fluid may also be taken from the abscess to determine what is causing your infection. TREATMENT  Your caregiver may prescribe antibiotic medicines to fight the infection. However, taking antibiotics alone usually does not cure an abscess. Your caregiver may need to make a small cut (incision) in the abscess to drain the pus. In some cases, gauze is packed into the abscess to reduce pain and to continue draining the area. HOME CARE INSTRUCTIONS   Only take over-the-counter or prescription medicines for pain, discomfort, or fever as directed by your caregiver.  If you were prescribed antibiotics, take them as directed. Finish them even if you start to feel better.  If gauze is used, follow your caregiver's directions for changing the gauze.  To avoid spreading the infection:  Keep your draining abscess covered with a  bandage.  Wash your hands well.  Do not share personal care items, towels, or whirlpools with others.  Avoid skin contact with others.  Keep your skin and clothes clean around the abscess.  Keep all follow-up appointments as directed by your caregiver. SEEK MEDICAL CARE IF:   You have increased pain, swelling, redness, fluid drainage, or bleeding.  You have muscle aches, chills, or a general ill feeling.  You have a fever. MAKE SURE YOU:   Understand these instructions.  Will watch your condition.  Will get help right away if you are not doing well or get worse. Document Released: 02/16/2005 Document Revised: 11/08/2011 Document Reviewed: 07/22/2011 [facility] Patient Information 2015 [place], [place]. This information is not intended to replace advice given to you by your health care provider. Make sure you discuss any questions you have with your health care provider.  Syncope Syncope is a medical term for fainting or passing out. This means you lose consciousness and drop to the ground. People are generally unconscious for less than 5 minutes. You may have some muscle twitches for up to 15 seconds before waking up and returning to normal. Syncope occurs more often in older adults, but it can happen to anyone. While most causes of syncope are not dangerous, syncope can be a sign of a serious medical problem. It is important to seek medical care.  CAUSES  Syncope is caused by a sudden drop in blood flow to the brain. The specific cause is often not determined. Factors that can bring on syncope include:  Taking medicines  that lower blood pressure.  Sudden changes in posture, such as standing up quickly.  Taking more medicine than prescribed.  Standing in one place for too long.  Seizure disorders.  Dehydration and excessive exposure to heat.  Low blood sugar (hypoglycemia).  Straining to have a bowel movement.  Heart disease, irregular heartbeat, or other circulatory  problems.  Fear, emotional distress, seeing blood, or severe pain. SYMPTOMS  Right before fainting, you may:  Feel dizzy or light-headed.  Feel nauseous.  See all white or all black in your field of vision.  Have cold, clammy skin. DIAGNOSIS  Your health care provider will ask about your symptoms, perform a physical exam, and perform an electrocardiogram (ECG) to record the electrical activity of your heart. Your health care provider may also perform other heart or blood tests to determine the cause of your syncope which may include:  Transthoracic echocardiogram (TTE). During echocardiography, sound waves are used to evaluate how blood flows through your heart.  Transesophageal echocardiogram (TEE).  Cardiac monitoring. This allows your health care provider to monitor your heart rate and rhythm in real time.  Holter monitor. This is a portable device that records your heartbeat and can help diagnose heart arrhythmias. It allows your health care provider to track your heart activity for several days, if needed.  Stress tests by exercise or by giving medicine that makes the heart beat faster. TREATMENT  In most cases, no treatment is needed. Depending on the cause of your syncope, your health care provider may recommend changing or stopping some of your medicines. HOME CARE INSTRUCTIONS  Have someone stay with you until you feel stable.  Do not drive, use machinery, or play sports until your health care provider says it is okay.  Keep all follow-up appointments as directed by your health care provider.  Lie down right away if you start feeling like you might faint. Breathe deeply and steadily. Wait until all the symptoms have passed.  Drink enough fluids to keep your urine clear or pale yellow.  If you are taking blood pressure or heart medicine, get up slowly and take several minutes to sit and then stand. This can reduce dizziness. SEEK IMMEDIATE MEDICAL CARE IF:   You have  a severe headache.  You have unusual pain in the chest, abdomen, or back.  You are bleeding from your mouth or rectum, or you have black or tarry stool.  You have an irregular or very fast heartbeat.  You have pain with breathing.  You have repeated fainting or seizure-like jerking during an episode.  You faint when sitting or lying down.  You have confusion.  You have trouble walking.  You have severe weakness.  You have vision problems. If you fainted, call your local emergency services (911 in [place] Do not drive yourself to the hospital.  MAKE SURE YOU:  Understand these instructions.  Will watch your condition.  Will get help right away if you are not doing well or get worse. Document Released: 05/09/2005 Document Revised: 05/14/2013 Document Reviewed: 07/08/2011 [facility] Patient Information 2015 [place], [place]. This information is not intended to replace advice given to you by your health care provider. Make sure you discuss any questions you have with your health care provider.  Trichomoniasis Trichomoniasis is an infection caused by an organism called Trichomonas. The infection can affect both women and men. In women, the outer female genitalia and the vagina are affected. In men, the penis is mainly affected, but the prostate and  other reproductive organs can also be involved. Trichomoniasis is a sexually transmitted infection (STI) and is most often passed to another person through sexual contact.  RISK FACTORS  Having unprotected sexual intercourse.  Having sexual intercourse with an infected partner. SIGNS AND SYMPTOMS  Symptoms of trichomoniasis in women include:  Abnormal gray-green frothy vaginal discharge.  Itching and irritation of the vagina.  Itching and irritation of the area outside the vagina. Symptoms of trichomoniasis in men include:   Penile discharge with or without pain.  Pain during urination. This results from inflammation of the  urethra. DIAGNOSIS  Trichomoniasis may be found during a Pap test or physical exam. Your health care provider may use one of the following methods to help diagnose this infection:  Examining vaginal discharge under a microscope. For men, urethral discharge would be examined.  Testing the pH of the vagina with a test tape.  Using a vaginal swab test that checks for the Trichomonas organism. A test is available that provides results within a few minutes.  Doing a culture test for the organism. This is not usually needed. TREATMENT   You may be given medicine to fight the infection. Women should inform their health care provider if they could be or are pregnant. Some medicines used to treat the infection should not be taken during pregnancy.  Your health care provider may recommend over-the-counter medicines or creams to decrease itching or irritation.  Your sexual partner will need to be treated if infected. HOME CARE INSTRUCTIONS   Take medicines only as directed by your health care provider.  Take over-the-counter medicine for itching or irritation as directed by your health care provider.  Do not have sexual intercourse while you have the infection.  Women should not douche or wear tampons while they have the infection.  Discuss your infection with your partner. Your partner may have gotten the infection from you, or you may have gotten it from your partner.  Have your sex partner get examined and treated if necessary.  Practice safe, informed, and protected sex.  See your health care provider for other STI testing. SEEK MEDICAL CARE IF:   You still have symptoms after you finish your medicine.  You develop abdominal pain.  You have pain when you urinate.  You have bleeding after sexual intercourse.  You develop a rash.  Your medicine makes you sick or makes you throw up (vomit). MAKE SURE YOU:  Understand these instructions.  Will watch your condition.  Will  get help right away if you are not doing well or get worse. Document Released: 11/02/2000 Document Revised: 09/23/2013 Document Reviewed: 02/18/2013 [facility] Patient Information 2015 [place], [place]. This information is not intended to replace advice given to you by your health care provider. Make sure you discuss any questions you have with your health care provider.

## 2015-02-21 NOTE — ED Notes (Signed)
Patient transported to X-ray 

## 2015-02-22 NOTE — ED Notes (Signed)
Pt abmulated without difficulty, denies dizziness.

## 2015-02-24 LAB — URINE CULTURE: Special Requests: NORMAL

## 2015-03-03 ENCOUNTER — Other Ambulatory Visit (HOSPITAL_BASED_OUTPATIENT_CLINIC_OR_DEPARTMENT_OTHER): Payer: Medicare HMO

## 2015-03-03 ENCOUNTER — Ambulatory Visit (HOSPITAL_BASED_OUTPATIENT_CLINIC_OR_DEPARTMENT_OTHER): Payer: Medicare HMO

## 2015-03-03 ENCOUNTER — Encounter: Payer: Self-pay | Admitting: Hematology

## 2015-03-03 ENCOUNTER — Ambulatory Visit (HOSPITAL_BASED_OUTPATIENT_CLINIC_OR_DEPARTMENT_OTHER): Payer: Medicare HMO | Admitting: Hematology

## 2015-03-03 ENCOUNTER — Telehealth: Payer: Self-pay | Admitting: Hematology

## 2015-03-03 VITALS — BP 136/71 | HR 65 | Temp 98.4°F | Resp 18 | Ht 62.0 in | Wt 176.4 lb

## 2015-03-03 DIAGNOSIS — Z5111 Encounter for antineoplastic chemotherapy: Secondary | ICD-10-CM

## 2015-03-03 DIAGNOSIS — B1920 Unspecified viral hepatitis C without hepatic coma: Secondary | ICD-10-CM

## 2015-03-03 DIAGNOSIS — C50411 Malignant neoplasm of upper-outer quadrant of right female breast: Secondary | ICD-10-CM

## 2015-03-03 DIAGNOSIS — F1910 Other psychoactive substance abuse, uncomplicated: Secondary | ICD-10-CM

## 2015-03-03 DIAGNOSIS — Z86000 Personal history of in-situ neoplasm of breast: Secondary | ICD-10-CM

## 2015-03-03 DIAGNOSIS — F39 Unspecified mood [affective] disorder: Secondary | ICD-10-CM

## 2015-03-03 DIAGNOSIS — Z170 Estrogen receptor positive status [ER+]: Secondary | ICD-10-CM

## 2015-03-03 DIAGNOSIS — I10 Essential (primary) hypertension: Secondary | ICD-10-CM

## 2015-03-03 DIAGNOSIS — R21 Rash and other nonspecific skin eruption: Secondary | ICD-10-CM

## 2015-03-03 DIAGNOSIS — Z9013 Acquired absence of bilateral breasts and nipples: Secondary | ICD-10-CM

## 2015-03-03 LAB — COMPREHENSIVE METABOLIC PANEL (CC13)
ALT: 30 U/L (ref 0–55)
ANION GAP: 9 meq/L (ref 3–11)
AST: 45 U/L — ABNORMAL HIGH (ref 5–34)
Albumin: 3.4 g/dL — ABNORMAL LOW (ref 3.5–5.0)
Alkaline Phosphatase: 74 U/L (ref 40–150)
BUN: 8.8 mg/dL (ref 7.0–26.0)
CALCIUM: 9.1 mg/dL (ref 8.4–10.4)
CHLORIDE: 110 meq/L — AB (ref 98–109)
CO2: 21 mEq/L — ABNORMAL LOW (ref 22–29)
CREATININE: 0.8 mg/dL (ref 0.6–1.1)
EGFR: >90 mL/min/{1.73_m2} (ref >90)
Glucose: 101 mg/dl (ref 70–140)
Potassium: 4.1 mEq/L (ref 3.5–5.1)
Sodium: 140 mEq/L (ref 136–145)
Total Bilirubin: 0.37 mg/dL (ref 0.20–1.20)
Total Protein: 6.9 g/dL (ref 6.4–8.3)

## 2015-03-03 LAB — CBC & DIFF AND RETIC
BASO%: 0.6 % (ref 0.0–2.0)
Basophils Absolute: 0 10*3/uL (ref 0.0–0.1)
EOS ABS: 0 10*3/uL (ref 0.0–0.5)
EOS%: 0.4 % (ref 0.0–7.0)
HCT: 34.3 % — ABNORMAL LOW (ref 34.8–46.6)
HGB: 11.1 g/dL — ABNORMAL LOW (ref 11.6–15.9)
Immature Retic Fract: 12.6 % — ABNORMAL HIGH (ref 1.60–10.00)
LYMPH%: 25.4 % (ref 14.0–49.7)
MCH: 28.9 pg (ref 25.1–34.0)
MCHC: 32.4 g/dL (ref 31.5–36.0)
MCV: 89.3 fL (ref 79.5–101.0)
MONO#: 0.4 10*3/uL (ref 0.1–0.9)
MONO%: 9.3 % (ref 0.0–14.0)
NEUT%: 64.3 % (ref 38.4–76.8)
NEUTROS ABS: 3 10*3/uL (ref 1.5–6.5)
Platelets: 161 10*3/uL (ref 145–400)
RBC: 3.84 10*6/uL (ref 3.70–5.45)
RDW: 19.4 % — AB (ref 11.2–14.5)
Retic %: 4.98 % — ABNORMAL HIGH (ref 0.70–2.10)
Retic Ct Abs: 191.23 10*3/uL — ABNORMAL HIGH (ref 33.70–90.70)
WBC: 4.7 10*3/uL (ref 3.9–10.3)
lymph#: 1.2 10*3/uL (ref 0.9–3.3)

## 2015-03-03 MED ORDER — CYCLOPHOSPHAMIDE CHEMO INJECTION 1 GM
600.0000 mg/m2 | Freq: Once | INTRAMUSCULAR | Status: AC
  Administered 2015-03-03: 1160 mg via INTRAVENOUS
  Filled 2015-03-03: qty 58

## 2015-03-03 MED ORDER — SODIUM CHLORIDE 0.9 % IV SOLN
Freq: Once | INTRAVENOUS | Status: AC
  Administered 2015-03-03: 15:00:00 via INTRAVENOUS
  Filled 2015-03-03: qty 8

## 2015-03-03 MED ORDER — SODIUM CHLORIDE 0.9 % IV SOLN
Freq: Once | INTRAVENOUS | Status: AC
  Administered 2015-03-03: 14:00:00 via INTRAVENOUS

## 2015-03-03 MED ORDER — DEXTROSE 5 % IV SOLN
75.0000 mg/m2 | Freq: Once | INTRAVENOUS | Status: AC
  Administered 2015-03-03: 150 mg via INTRAVENOUS
  Filled 2015-03-03: qty 15

## 2015-03-03 NOTE — Progress Notes (Signed)
Pt noted to be coughing more during treatment time. Marissa Brooks called for evaluation. Pt instructed is OK to go home but any increasing s/s of infection or worsening cold to call.  Per pt request called [facility] to rearrange her ride for her injection shot on Thursday at 10 am

## 2015-03-03 NOTE — Telephone Encounter (Signed)
Gave adn printed appt sched and avs fo rpt for NOV °

## 2015-03-03 NOTE — Progress Notes (Signed)
[place]  [phone] Fax:([phone]  Clinic Follow Up Note   Patient Care Team: Elwyn Reach, MD as PCP - General (Internal Medicine) Autumn Messing III, MD as Consulting Physician (General Surgery) 03/03/2015  CHIEF COMPLAINTS:  Follow-up of breast cancer  Oncology History   Breast cancer of upper-outer quadrant of right female breast s/p Right MRM 10/30/14   Staging form: Breast, AJCC 7th Edition     Pathologic: Stage IIA (T1b, N1a, cM0) - Unsigned       Breast cancer of upper-outer quadrant of right female breast s/p Right MRM 10/30/14   06/03/2004 Cancer Diagnosis Left breast DCIS, status post mastectomy   06/06/2014 Imaging A mammogram and ultrasound showed a irregular 0.7 cm mass at the 12:30 clock position of right breast. Axillary nodes were negative.   08/12/2014 Receptors her2 ER 100% positive, PR 88% positive, HER-2 negative. Ki-67 19%.   08/12/2014 Initial Biopsy Right breast 12:30 o'clock biopsy showed invasive ductal carcinoma, grade 2.   10/30/2014 Surgery Right breast mastectomy and axillary node dissection, surgical margins were negative.    10/30/2014 Initial Diagnosis Breast cancer of upper-outer quadrant of right female breast s/p Right MRM 10/30/14   10/30/2014 Pathology Results Right breast radical mastectomy showed invasive ductal carcinoma, grade 1, tumor measuring 1.0 cm, margins were negative lymphovascular invasion (+), 1 sentinel lymph nodes positive, 15 additional axillary nodes negative.  (+) DCIS    12/30/2014 -  Chemotherapy Docetaxel 75 mg/m, Cytoxan 600 mg/m, every 3 weeks.     HISTORY OF PRESENTING ILLNESS:  Marissa Brooks 59 y.o. female is here because of recently diagnosed right breast cancer.   This was discovered by screening mammogram in January 2016. She did not have palpable breast mass or any other symptoms. Biopsy on 08/12/2014 showed invasive ductal carcinoma, ER/PR positive, HER-2 negative. She underwent right  breast mastectomy by Dr. Marcello Moores on 10/28/2014. Marland Kitchen   She has had some pain underneath her R armpit with some swelling. She reports that her "drain fell out" and was seen by Dr. Marlou Starks on 7/15 at which time a seroma was drained. Otherwise, she denies any problems with appetite, energy, pain. She does report having lost weight 195 lbs to 186 lbs though over an unspecified time interval. She is now menopausal with last menstrual period being sometime in her late 77s.  Per records obtained from [facility], she underwent left simple mastectomy on at [facility] on 07/16/04 after detection of multiple abnormalities in the left breast on mammography and was diagnosed with DCIS. On 05/27/05, she underwent placement of left tissue expander but declined additional reconstructive surgery. On 06/15/06, she was seen by Dr. Sophronia Simas for possible adjuvant tamoxifen and was recommended to undergo close surveillance. 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   She follows with Dr. Gala Romney for PCP care and was last seen by them prior to the surgery. She is currently undergoing substance abuse rehab. She reports that 1 pack of cigarettes lasts her a week and has not used cocaine for a month ago. She does report using heroin in the past. She also has history of bipolar for which she is on Abilify. She is widowed, has no children. She lives with a roommate who she has known for the last 10 years.  CURRENT THERAPY: Docetaxel 75 mg/m, Cytoxan 6105m/m, every 3 weeks, started on 12/30/2014  INTERIM HISTORY [name] for follow up and last cycle  chemo. She has an syncope episode on 10/1 when she stood up. She was evaluated by ED, and felt to be vasovagal. She was also found to have a skin abscess on her scalp, and the ED physician due to incision and drain. She went home with oral antibiotics, which she is still taking. She feels well overall, denies any pain, fever,  chills, or other symptoms. She is eating well.  Fingers are little numb,  MEDICAL HISTORY:  Past Medical History  Diagnosis Date  . Hypertension   . Shortness of breath dyspnea     with walking  . Pneumonia   . Anxiety   . Depression   . Bipolar disorder ([place])   . GERD (gastroesophageal reflux disease)   . Arthritis   . Cancer [facility]) 2002    left breast cancer tx in [place], right breast cancer 08/2014  . ETOH abuse     as of 10/21/14 none for a week  . Cocaine abuse     last use 10/13/14    SURGICAL HISTORY: Past Surgical History  Procedure Laterality Date  . Mastectomy      and Implant  . Mandible fracture surgery    . Breast surgery  2002    left mastectomy with implant  . Tonsillectomy    . Mastectomy w/ sentinel node biopsy Right 10/30/2014  . Simple mastectomy with axillary sentinel node biopsy Right 10/30/2014    Procedure: RIGHT MASTECTOMY WITH SENTINEL NODE MAPPING;  Surgeon: Autumn Messing III, MD;  Location: [facility];  Service: General;  Laterality: Right;    SOCIAL HISTORY: Social History   Social History  . Marital Status: Widowed    Spouse Name: N/A  . Number of Children: N/A  . Years of Education: N/A   Occupational History  . Not on file.   Social History Main Topics  . Smoking status: Current Every Day Smoker -- 0.25 packs/day    Types: Cigarettes  . Smokeless tobacco: Never Used  . Alcohol Use: Yes     Comment: was a heavy drinker (daily) none for a week (as of 10/21/14.  . Drug Use: Yes    Special: Cocaine, Marijuana     Comment: last use of cocaine ?10/13/14, last use of marijuana 3 weeks ago (as of 10/21/14)  . Sexual Activity: Not Currently    Birth Control/ Protection: Abstinence   Other Topics Concern  . Not on file   Social History Narrative    FAMILY HISTORY: Family History  Problem Relation Age of Onset  . Breast cancer Sister 24  . Mental illness Mother   . HIV/AIDS Sister     Deceased    ALLERGIES:  is allergic to  centrum.  MEDICATIONS:  Current Outpatient Prescriptions  Medication Sig Dispense Refill  . ARIPiprazole (ABILIFY) 5 MG tablet Take 1 tablet (5 mg total) by mouth daily. For mood control 30 tablet 0  . clindamycin (CLEOCIN) 300 MG capsule Take 1 capsule (300 mg total) by mouth 4 (four) times daily. X 7 days 28 capsule 0  . dexamethasone (DECADRON) 4 MG tablet Take 2 tab daily the day before chemo, and daily after chemo for 3 days 16 tablet 1  . lisinopril (PRINIVIL,ZESTRIL) 10 MG tablet Take 10 mg by mouth daily.    . metroNIDAZOLE (FLAGYL) 500 MG tablet Take 1 tablet (500 mg total) by mouth 2 (two) times daily. One po bid x 7 days 14 tablet 0  . naproxen (NAPROSYN) 500 MG tablet Take 500 mg  by mouth 2 (two) times daily as needed for moderate pain.    Marland Kitchen ondansetron (ZOFRAN) 8 MG tablet TAKE 1 TABLET (8 MG TOTAL) BY MOUTH TWO   (TWO) TIMES DAILY. START THE DAY AFTER CHEMO FOR THREE   DAYS. THEN TAKE AS NEEDED FOR NAUSEA OR V 30 tablet 1  . sertraline (ZOLOFT) 100 MG tablet Take 1 tablet (100 mg total) by mouth at bedtime. For depression 30 tablet 0  . traZODone (DESYREL) 100 MG tablet Take 1 tablet (100 mg total) by mouth at bedtime. For sleep (Patient taking differently: Take 100 mg by mouth daily. For sleep) 30 tablet 0   No current facility-administered medications for this visit.    REVIEW OF SYSTEMS:   Constitutional: Denies fevers, chills or abnormal night sweats Eyes: Denies blurriness of vision, double vision or watery eyes Ears, nose, mouth, throat, and face: Denies mucositis or sore throat Respiratory: Denies cough, dyspnea or wheezes Cardiovascular: Denies palpitation, chest discomfort or lower extremity swelling Gastrointestinal:  Denies nausea, heartburn or change in bowel habits Skin: Denies abnormal skin rashes Lymphatics: Denies new lymphadenopathy or easy bruising Neurological:Denies numbness, tingling or new weaknesses Behavioral/Psych: Mood is stable, no new changes   All other systems were reviewed with the patient and are negative.  PHYSICAL EXAMINATION: ECOG PERFORMANCE STATUS: 0 - Asymptomatic  Filed Vitals:   03/03/15 1303  BP: 136/71  Pulse: 65  Temp: 98.4 F (36.9 C)  Resp: 18   Filed Weights   03/03/15 1303  Weight: 176 lb 6.4 oz (80.015 kg)    GENERAL:alert, no distress and comfortable SKIN: skin color, texture, turgor are normal, no rashes or significant lesions, except multiple large healing skim rash/ulcers and pigmentation/color change on b/l front arms, L>R. She has a healing skin lesion on her scalp  EYES: normal, conjunctiva are pink and non-injected, sclera clear OROPHARYNX:no exudate, no erythema and lips, buccal mucosa, and tongue normal  NECK: supple, thyroid normal size, non-tender, without nodularity LYMPH:  no palpable lymphadenopathy in the cervical, axillary or inguinal BREAST: s/p bilateral mastectomy, the swelling noted just inferior the right axilla extending across to the front is stable, exam of bilateral axilla revealed no palpable mass or adenopathy. LUNGS: clear to auscultation and percussion with normal breathing effort HEART: regular rate & rhythm and no murmurs and no lower extremity edema ABDOMEN:abdomen soft, non-tender and normal bowel sounds Musculoskeletal:no cyanosis of digits and no clubbing  PSYCH: alert & oriented x 3 NEURO: no focal motor/sensory deficits  LABORATORY DATA:  I have reviewed the data as listed CBC Latest Ref Rng 03/03/2015 02/21/2015 02/10/2015  WBC 3.9 - 10.3 10e3/uL 4.7 34.1(H) 4.5  Hemoglobin 11.6 - 15.9 g/dL 11.1(L) 9.6(L) 10.5(L)  Hematocrit 34.8 - 46.6 % 34.3(L) 28.3(L) 31.8(L)  Platelets 145 - 400 10e3/uL 161 156 145    CMP Latest Ref Rng 03/03/2015 02/21/2015 02/10/2015  Glucose 70 - 140 mg/dl 101 123(H) 85  BUN 7.0 - 26.0 mg/dL 8.8 9 9.8  Creatinine 0.6 - 1.1 mg/dL 0.8 0.80 0.8  Sodium 136 - 145 mEq/L 140 138 139  Potassium 3.5 - 5.1 mEq/L 4.1 3.0(L) 4.4  Chloride  101 - 111 mmol/L - 108 -  CO2 22 - 29 mEq/L 21(L) 22 23  Calcium 8.4 - 10.4 mg/dL 9.1 8.3(L) 8.7  Total Protein 6.4 - 8.3 g/dL 6.9 - 6.6  Total Bilirubin 0.20 - 1.20 mg/dL 0.37 - 0.32  Alkaline Phos 40 - 150 U/L 74 - 56  AST 5 - 34  U/L 45(H) - 33  ALT 0 - 55 U/L 30 - 32    PATHOLOGY REPORT 10/30/2014 ADDITIONAL INFORMATION: 2. FLUORESCENCE IN-SITU HYBRIDIZATION  Results: HER2 - NEGATIVE RATIO OF HER2/CEP17 SIGNALS 1.62 AVERAGE HER2 COPY NUMBER PER CELL 2.10 Reference Range: NEGATIVE HER2/CEP17 Ratio <2.0 and average HER2 copy number <4.0 EQUIVOCAL HER2/CEP17 Ratio <2.0 and average HER2 copy number 4.0 and <6.0 POSITIVE HER2/CEP17 Ratio >=2.0 or <2.0 and average HER2 copy number >=6.0 [place] RUND DO Pathologist, Electronic Signature ( Signed 11/26/2014) 2. her2 This is NOT signed out FINAL DIAGNOSIS Diagnosis 1. Lymph node, sentinel, biopsy, Right #1 - ONE LYMPH NODE, POSITIVE FOR METASTATIC MAMMARY CARCINOMA (1/1). - INTRANODAL TUMOR DEPOSIT IS 1.2 CM - POSITIVE FOR EXTRACAPSULAR TUMOR EXTENSION. 2. Breast, radical mastectomy (including lymph nodes), Right and axillary contents - INVASIVE DUCTAL CARCINOMA, SEE COMMENT. - POSITIVE FOR LYMPH VASCULAR INVASION. - DUCTAL CARCINOMA IN SITU WITH NECROSIS AND CALCIFICATIONS. - FIFTEEN LYMPH NODE S, NEGATIVE FOR TUMOR (0/15). - PREVIOUS BIOPSY SITE. - SEE TUMOR SYNOPTIC TEMPLATE BELOW  Microscopic Comment 2. BREAST, INVASIVE TUMOR, WITH LYMPH NODES PRESENT Specimen, including laterality and lymph node sampling (sentinel, non-sentinel): Right breast with sentinel lymph node sampling Procedure: Radical mastectomy Histologic type: Ductal Grade: I of III Tubule formation: 2 Nuclear pleomorphism: 2 Mitotic: 1 Tumor size (gross measurement): 1.0 cm Margins: Invasive, distance to closest margin: 1.8 cm (posterior) In-situ, distance to closest margin: 1.8 cm (posterior) If margin positive, focally or broadly: N/A Lymphovascular  invasion: Present Ductal carcinoma in situ: Present Grade: 2 of 3 Extensive intraductal component: Absent Lobular neoplasia: Absent Tumor focality: Unifocal Treatment effect: None If present, treatment effect in breast tissue, lymph nodes or both: N/A Extent of tumor: Skin: N/A Nipple: N/A Skeletal muscle: N/A Lymph nodes: Examined: 1 Sentinel 15 Non-sentinel 16 Total Lymph nodes with metastasis: 1 Isolated tumor cells (< 0.2 mm): 0 Micrometastasis: (> 0.2 mm and < 2.0 mm): 0 Macrometastasis: (> 2.0 mm): x 1 Extracapsular extension: Present Breast prognostic profile: Estrogen receptor: Not repeated, previous study demonstrated 100% positivity ([id number]) Progesterone receptor: Not repeated, previous study demonstrated 88% positivity ([id number]) Her 2 neu: Repeated, previous study demonstrated no amplification ([id number]) Ki-67: Not repeated, previous study demonstrated 19% proliferation rate ([id number]) Non-neoplastic breast: Previous biopsy site, fibrocystic change, columnar cell change, sclerosing adenosis, benign adenosis and calcifications TNM: p T1b, pN1a, pMX   RADIOGRAPHIC STUDIES: I have personally reviewed the radiological images as listed and agreed with the findings in the report. Dg Chest 2 View  02/21/2015   CLINICAL DATA:  Acute onset of generalized weakness. Status post fall. Initial encounter.  EXAM: CHEST  2 VIEW  COMPARISON:  Chest radiograph performed 10/21/2010  FINDINGS: The lungs are well-aerated and clear. There is no evidence of focal opacification, pleural effusion or pneumothorax.  The heart is borderline normal in size. No acute osseous abnormalities are seen. Clips are seen at the right axilla.  IMPRESSION: No acute cardiopulmonary process seen.   Electronically Signed   By: Garald Balding M.D.   On: 02/21/2015 22:40    ASSESSMENT & PLAN:  Ms. Coulibaly is a 59 year old female with h/o left DCIS s/p mastectomy who presents with R invasive ductal  carcinoma s/p mastectomy  1. Stage IIa, pT1bpN1a,M0, right invasive ductal carcinoma s/p mastectomy, ER+/PR+/HER- , G1 -Discussed with the patient the results of her surgical path and her cancer staging -The natural history of breast cancer were reviewed with her, and moderate risk of cancer recurrence after her surgery, giving  the note positive disease. -Giving the note positive disease, I recommend adjuvant chemotherapy with docetaxel and Cytoxan every 3 weeks for 4 cycles. - lab reviewed, adequate for treatment, we'll proceed 4TH cycle chemotherapy today. -I will also refer her to radiation oncology today to consider adjuvant irradiation after chemo -Giving the strong ER/PR positivity of her tumor, I would recommend adjuvant aromatase inhibitor to reduce her risk of cancer recurrence after she completes radiation.  2. Genetics -Giving her asthma history of breast cancer twice, and family history (sister) of breast cancer, I'll refer her to see a genetic counser, she is still thinking about it  -She does not have children, but does have sisters.  2. History of cirrhosis and HCV infection -No imaging or additional workup found in our system though noted to have undergone treatment for HCV in 1999 at [facility] per note by Aurora Mask  [06/02/09] -Most recent LFTs reassuring -She still has very high HCV load, will refer her to liver clinic when she completes adjuvant chemo   3. Polysubstance abuse: Recently hospitalized in February 2016 for substance-induced mood disorder. -Continue to encourage patient to continue with substance rehab and avoid illicit substances -She agrees to quit alcohol and illicit drug (cocane) completely when she is on chemo, she will working on smoking cessation also -Her urine test was still positive for cocaine on August 9, we discussed cocaine cessation again, and she agrees to stop it completely  4. Hypertension:  -Continue follow-up with her primary care  physician. -She has been slightly on high  5. Mood disorder: She does not follow with a psychiatrist and reports her PCP prescribes all of her psychiatric medications.  -Defer to PCP  6. Scalp skin abscess -s/p I&D, on oral abx -it's healing very well   6 f/l arm skin rashes -Likely secondary to docetaxel infusion. Improved  -I'll give her 100 mL normal saline flush after docetaxel.  -She knows to use topical steroids as needed.  Plan -last cycle chemotherapy TC today -rad/onc referral today  - return to clinic in 3 weeks    Truitt Merle  03/03/2015

## 2015-03-03 NOTE — Patient Instructions (Signed)
[place] Discharge Instructions for Patients Receiving Chemotherapy  Today you received the following chemotherapy agents taxotere and cytoxan  To help prevent nausea and vomiting after your treatment, we encourage you to take your nausea medication if needed.   If you develop nausea and vomiting that is not controlled by your nausea medication, call the clinic.   BELOW ARE SYMPTOMS THAT SHOULD BE REPORTED IMMEDIATELY:  *FEVER GREATER THAN 100.5 F  *CHILLS WITH OR WITHOUT FEVER  NAUSEA AND VOMITING THAT IS NOT CONTROLLED WITH YOUR NAUSEA MEDICATION  *UNUSUAL SHORTNESS OF BREATH  *UNUSUAL BRUISING OR BLEEDING  TENDERNESS IN MOUTH AND THROAT WITH OR WITHOUT PRESENCE OF ULCERS  *URINARY PROBLEMS  *BOWEL PROBLEMS  UNUSUAL RASH Items with * indicate a potential emergency and should be followed up as soon as possible.  Feel free to call the clinic you have any questions or concerns. The clinic phone number is [phone].  Please show the [place] at check-in to the Emergency Department and triage nurse.

## 2015-03-03 NOTE — Progress Notes (Signed)
Addendum: infusion room nurse reports that patient has a runny nose, some redness of her eyes, and an occasional cough this afternoon.    Patient denies any chest pain, chest pressure, shortness of breath, or pain with inspiration.  On exam, patient's breath sounds clear bilaterally with no bruits.  No acute respiratory distress.  Patient remains afebrile.  Patient  Could possibly be developing a cold.  Advised patient to call/return directly to the emergency department if she develops any worsening  Symptoms or fever/chills whatsoever.  Reviewed all findings with Dr.Feng.

## 2015-03-04 ENCOUNTER — Encounter: Payer: Self-pay | Admitting: *Deleted

## 2015-03-04 ENCOUNTER — Telehealth: Payer: Self-pay | Admitting: Hematology

## 2015-03-04 ENCOUNTER — Ambulatory Visit: Payer: Self-pay

## 2015-03-04 NOTE — Progress Notes (Signed)
[place] Work  Clinical Social Work was notified by [facility] that patient does not have a Materials engineer for her appointment tomorrow. Pt declined offer of other options, bus pass, etc. And stated she planned to cancel said appointment. Pt agrees to contact CSW as needed for possible other transportation options for next apointment.  Pt prefers to use [facility], but they do not always have a driver.     Loren Racer, [place] Worker [place]  [place] Phone: [phone] Fax: [phone]

## 2015-03-04 NOTE — Progress Notes (Signed)
[name]  Clinical Social Work was re-contacted by patient, as patient changed her appointment and was requesting assistance. CSW mailed bus pass for next week's appointment and also educated pt on how to access medicaid transportation as an additional option. Pt appreciated assistance.  Clinical Social Work interventions: Resource education and assistance Marissa Brooks, [place] Worker [place]  [place] Phone: [phone] Fax: [phone]

## 2015-03-04 NOTE — Telephone Encounter (Signed)
per cld to r/s inj-stated no transportation-r/s appt and gave pt r/s time & date

## 2015-03-05 ENCOUNTER — Ambulatory Visit: Payer: Self-pay

## 2015-03-05 ENCOUNTER — Telehealth: Payer: Self-pay | Admitting: *Deleted

## 2015-03-05 NOTE — Telephone Encounter (Signed)
Left vm for pt to return call to confirm future appt and congratulate on completing chemo.

## 2015-03-09 ENCOUNTER — Encounter: Payer: Self-pay | Admitting: Radiation Oncology

## 2015-03-10 ENCOUNTER — Telehealth: Payer: Self-pay | Admitting: Hematology

## 2015-03-10 ENCOUNTER — Ambulatory Visit: Payer: Self-pay

## 2015-03-10 ENCOUNTER — Encounter: Payer: Self-pay | Admitting: Radiation Oncology

## 2015-03-10 NOTE — Progress Notes (Signed)
Location of Breast Cancer: Right Breast  Upper Outer Quadrant   Histology per Pathology Report: Diagnosis 08/12/14: Breast, right, needle core biopsy, 12:30 o'clock - INVASIVE DUCTAL CARCINOMA, SEE COMMENT. - DUCTAL CARCINOMA IN SITU WITH NECROSIS. - CALCIFICATIONS PRESENT.   Receptor Status: ER(100% +), PR (88% +), Her2-neu ( neg  KI-67 19%)  Did patient present with symptoms (if so, please note symptoms) or was this found on screening mammography?: screening mammogram in January 2016, right arm pain under axilla,   Past/Anticipated interventions by surgeon, if PRX:[id number] 10/30/14: Dr. Johnnette Barrios 1. Lymph node, sentinel, biopsy, Right #1 - ONE LYMPH NODE, POSITIVE FOR METASTATIC MAMMARY CARCINOMA (1/1).- INTRANODAL TUMOR DEPOSIT IS 1.2 CM- POSITIVE FOR EXTRACAPSULAR TUMOR EXTENSION. 2. Breast, radical mastectomy (including lymph nodes), Right and axillary contents - INVASIVE DUCTAL CARCINOMA, SEE COMMENT.- POSITIVE FOR LYMPH VASCULAR INVASION.- DUCTAL CARCINOMA IN SITU WITH NECROSIS AND CALCIFICATIONS. 1 of 4FINAL for Marissa Brooks, Marissa Brooks ([id number])- FIFTEEN LYMPH NODE S, NEGATIVE FOR TUMOR (0/15).- PREVIOUS BIOPSY SITE.   Right Breast bx papilloma, with Lumpectomy  10/26/2010 Dr. Eddie Dibbles Toth,MD   Past/Anticipated interventions by medical oncology, if any: Chemotherapy :Dr. Burr Medico,  12/30/2014 -  Chemotherapy Docetaxel 75 mg/m, Cytoxan 600 mg/m, every 3 weeks.         Lymphedema issues, if any:    Pain issues, if any: Currently undergoing substance abuse rehab   SAFETY ISSUES:  Prior radiation?   Pacemaker/ICD? NO  Possible current pregnancy? NO  Is the patient on methotrexate?  NO  Current Complaints / other details: Widowed, no children,   Bipolar/depression/anxiety, Hx Left Breast Cancer mastectomy and implant  tx in [place] 2002,Left simple mastectomy  07/16/04, /, 1/5/7 left tissue expander ,  Right breast cancer 08/2014, Hx  ETOH (last use 10/21/14)  and Cocaine abuse(last use 01/2015) Marijuana use  (last use 10/21/14) Hx heroin in the past, smokes 1 pack cigarettes weekly, sister breast cancer   Allergies:Centrum=Hives  Rebecca Eaton, RN 03/10/2015,9:02 AM

## 2015-03-10 NOTE — Telephone Encounter (Signed)
pt cld to r/s inj appt to 10/25-gave r/s time and date

## 2015-03-11 ENCOUNTER — Ambulatory Visit
Admission: RE | Admit: 2015-03-11 | Discharge: 2015-03-11 | Disposition: A | Discharge status: Home / Self Care | Payer: Medicare HMO | Source: Ambulatory Visit | Attending: Radiation Oncology | Admitting: Radiation Oncology

## 2015-03-11 ENCOUNTER — Ambulatory Visit: Payer: Medicare HMO

## 2015-03-11 HISTORY — DX: Allergy, unspecified, initial encounter: T78.40XA

## 2015-03-11 HISTORY — DX: Malignant neoplasm of unspecified site of unspecified female breast: C50.919

## 2015-03-15 ENCOUNTER — Inpatient Hospital Stay (HOSPITAL_COMMUNITY)
Admission: EM | Admit: 2015-03-15 | Discharge: 2015-03-22 | DRG: 380 | Disposition: A | Discharge status: Home Health | Payer: Medicare HMO | Attending: Internal Medicine | Admitting: Internal Medicine

## 2015-03-15 ENCOUNTER — Encounter (HOSPITAL_COMMUNITY): Payer: Self-pay | Admitting: Internal Medicine

## 2015-03-15 DIAGNOSIS — K228 Other specified diseases of esophagus: Secondary | ICD-10-CM | POA: Diagnosis present

## 2015-03-15 DIAGNOSIS — Z9013 Acquired absence of bilateral breasts and nipples: Secondary | ICD-10-CM | POA: Diagnosis not present

## 2015-03-15 DIAGNOSIS — K219 Gastro-esophageal reflux disease without esophagitis: Secondary | ICD-10-CM | POA: Diagnosis present

## 2015-03-15 DIAGNOSIS — K2210 Ulcer of esophagus without bleeding: Secondary | ICD-10-CM | POA: Diagnosis present

## 2015-03-15 DIAGNOSIS — L658 Other specified nonscarring hair loss: Secondary | ICD-10-CM | POA: Diagnosis present

## 2015-03-15 DIAGNOSIS — D62 Acute posthemorrhagic anemia: Secondary | ICD-10-CM | POA: Diagnosis not present

## 2015-03-15 DIAGNOSIS — Y95 Nosocomial condition: Secondary | ICD-10-CM | POA: Diagnosis present

## 2015-03-15 DIAGNOSIS — E860 Dehydration: Secondary | ICD-10-CM | POA: Diagnosis present

## 2015-03-15 DIAGNOSIS — K209 Esophagitis, unspecified without bleeding: Secondary | ICD-10-CM

## 2015-03-15 DIAGNOSIS — C50411 Malignant neoplasm of upper-outer quadrant of right female breast: Secondary | ICD-10-CM | POA: Diagnosis not present

## 2015-03-15 DIAGNOSIS — D72829 Elevated white blood cell count, unspecified: Secondary | ICD-10-CM

## 2015-03-15 DIAGNOSIS — K922 Gastrointestinal hemorrhage, unspecified: Secondary | ICD-10-CM

## 2015-03-15 DIAGNOSIS — I10 Essential (primary) hypertension: Secondary | ICD-10-CM | POA: Diagnosis present

## 2015-03-15 DIAGNOSIS — R1310 Dysphagia, unspecified: Secondary | ICD-10-CM

## 2015-03-15 DIAGNOSIS — N179 Acute kidney failure, unspecified: Secondary | ICD-10-CM

## 2015-03-15 DIAGNOSIS — F1020 Alcohol dependence, uncomplicated: Secondary | ICD-10-CM | POA: Diagnosis present

## 2015-03-15 DIAGNOSIS — M545 Low back pain, unspecified: Secondary | ICD-10-CM | POA: Diagnosis present

## 2015-03-15 DIAGNOSIS — T451X5A Adverse effect of antineoplastic and immunosuppressive drugs, initial encounter: Secondary | ICD-10-CM | POA: Diagnosis present

## 2015-03-15 DIAGNOSIS — E876 Hypokalemia: Secondary | ICD-10-CM | POA: Diagnosis present

## 2015-03-15 DIAGNOSIS — D899 Disorder involving the immune mechanism, unspecified: Secondary | ICD-10-CM | POA: Diagnosis present

## 2015-03-15 DIAGNOSIS — A419 Sepsis, unspecified organism: Secondary | ICD-10-CM | POA: Diagnosis present

## 2015-03-15 DIAGNOSIS — J156 Pneumonia due to other aerobic Gram-negative bacteria: Secondary | ICD-10-CM | POA: Diagnosis present

## 2015-03-15 DIAGNOSIS — R4702 Dysphasia: Secondary | ICD-10-CM | POA: Diagnosis present

## 2015-03-15 DIAGNOSIS — K920 Hematemesis: Secondary | ICD-10-CM | POA: Insufficient documentation

## 2015-03-15 DIAGNOSIS — Z79899 Other long term (current) drug therapy: Secondary | ICD-10-CM | POA: Diagnosis not present

## 2015-03-15 DIAGNOSIS — E7220 Disorder of urea cycle metabolism, unspecified: Secondary | ICD-10-CM | POA: Diagnosis not present

## 2015-03-15 DIAGNOSIS — Z170 Estrogen receptor positive status [ER+]: Secondary | ICD-10-CM | POA: Diagnosis not present

## 2015-03-15 DIAGNOSIS — F329 Major depressive disorder, single episode, unspecified: Secondary | ICD-10-CM | POA: Diagnosis present

## 2015-03-15 DIAGNOSIS — M1990 Unspecified osteoarthritis, unspecified site: Secondary | ICD-10-CM | POA: Diagnosis present

## 2015-03-15 DIAGNOSIS — K7460 Unspecified cirrhosis of liver: Secondary | ICD-10-CM | POA: Diagnosis present

## 2015-03-15 DIAGNOSIS — D6959 Other secondary thrombocytopenia: Secondary | ICD-10-CM | POA: Diagnosis present

## 2015-03-15 DIAGNOSIS — B182 Chronic viral hepatitis C: Secondary | ICD-10-CM | POA: Diagnosis present

## 2015-03-15 DIAGNOSIS — B1920 Unspecified viral hepatitis C without hepatic coma: Secondary | ICD-10-CM | POA: Diagnosis present

## 2015-03-15 DIAGNOSIS — D696 Thrombocytopenia, unspecified: Secondary | ICD-10-CM | POA: Diagnosis present

## 2015-03-15 DIAGNOSIS — Z803 Family history of malignant neoplasm of breast: Secondary | ICD-10-CM

## 2015-03-15 DIAGNOSIS — F1024 Alcohol dependence with alcohol-induced mood disorder: Secondary | ICD-10-CM | POA: Diagnosis not present

## 2015-03-15 DIAGNOSIS — D638 Anemia in other chronic diseases classified elsewhere: Secondary | ICD-10-CM | POA: Diagnosis present

## 2015-03-15 DIAGNOSIS — R110 Nausea: Secondary | ICD-10-CM | POA: Diagnosis not present

## 2015-03-15 DIAGNOSIS — Z888 Allergy status to other drugs, medicaments and biological substances status: Secondary | ICD-10-CM | POA: Diagnosis not present

## 2015-03-15 DIAGNOSIS — F319 Bipolar disorder, unspecified: Secondary | ICD-10-CM | POA: Diagnosis present

## 2015-03-15 DIAGNOSIS — F17210 Nicotine dependence, cigarettes, uncomplicated: Secondary | ICD-10-CM | POA: Diagnosis present

## 2015-03-15 DIAGNOSIS — F32A Depression, unspecified: Secondary | ICD-10-CM

## 2015-03-15 DIAGNOSIS — J189 Pneumonia, unspecified organism: Secondary | ICD-10-CM | POA: Diagnosis present

## 2015-03-15 DIAGNOSIS — Z7952 Long term (current) use of systemic steroids: Secondary | ICD-10-CM | POA: Diagnosis not present

## 2015-03-15 DIAGNOSIS — Z9221 Personal history of antineoplastic chemotherapy: Secondary | ICD-10-CM | POA: Diagnosis not present

## 2015-03-15 DIAGNOSIS — F419 Anxiety disorder, unspecified: Secondary | ICD-10-CM

## 2015-03-15 DIAGNOSIS — F1910 Other psychoactive substance abuse, uncomplicated: Secondary | ICD-10-CM | POA: Diagnosis present

## 2015-03-15 DIAGNOSIS — Z853 Personal history of malignant neoplasm of breast: Secondary | ICD-10-CM

## 2015-03-15 DIAGNOSIS — Z830 Family history of human immunodeficiency virus [HIV] disease: Secondary | ICD-10-CM | POA: Diagnosis not present

## 2015-03-15 DIAGNOSIS — N178 Other acute kidney failure: Secondary | ICD-10-CM | POA: Diagnosis not present

## 2015-03-15 DIAGNOSIS — C50211 Malignant neoplasm of upper-inner quadrant of right female breast: Secondary | ICD-10-CM | POA: Diagnosis not present

## 2015-03-15 DIAGNOSIS — R112 Nausea with vomiting, unspecified: Secondary | ICD-10-CM | POA: Diagnosis present

## 2015-03-15 DIAGNOSIS — G9340 Encephalopathy, unspecified: Secondary | ICD-10-CM | POA: Diagnosis present

## 2015-03-15 DIAGNOSIS — Z720 Tobacco use: Secondary | ICD-10-CM | POA: Diagnosis present

## 2015-03-15 HISTORY — DX: Tobacco use: Z72.0

## 2015-03-15 HISTORY — DX: Unspecified viral hepatitis C without hepatic coma: B19.20

## 2015-03-15 HISTORY — DX: Acute kidney failure, unspecified: N17.9

## 2015-03-15 LAB — CBC WITH DIFFERENTIAL/PLATELET
BASOS ABS: 0 10*3/uL (ref 0.0–0.1)
BASOS PCT: 0 %
EOS ABS: 0 10*3/uL (ref 0.0–0.7)
Eosinophils Relative: 0 %
HCT: 27.3 % — ABNORMAL LOW (ref 36.0–46.0)
HEMOGLOBIN: 9.3 g/dL — AB (ref 12.0–15.0)
LYMPHS ABS: 0.5 10*3/uL — AB (ref 0.7–4.0)
Lymphocytes Relative: 6 %
MCH: 28.1 pg (ref 26.0–34.0)
MCHC: 34.1 g/dL (ref 30.0–36.0)
MCV: 82.5 fL (ref 78.0–100.0)
Monocytes Absolute: 1.5 10*3/uL — ABNORMAL HIGH (ref 0.1–1.0)
Monocytes Relative: 17 %
NEUTROS ABS: 6.7 10*3/uL (ref 1.7–7.7)
Neutrophils Relative %: 77 %
Platelets: 128 10*3/uL — ABNORMAL LOW (ref 150–400)
RBC: 3.31 MIL/uL — ABNORMAL LOW (ref 3.87–5.11)
RDW: 17.3 % — ABNORMAL HIGH (ref 11.5–15.5)
WBC: 8.7 10*3/uL (ref 4.0–10.5)

## 2015-03-15 LAB — URINALYSIS, ROUTINE W REFLEX MICROSCOPIC
BILIRUBIN URINE: NEGATIVE
GLUCOSE, UA: NEGATIVE mg/dL
Ketones, ur: NEGATIVE mg/dL
Nitrite: NEGATIVE
PH: 5 (ref 5.0–8.0)
Protein, ur: 30 mg/dL — AB
SPECIFIC GRAVITY, URINE: 1.019 (ref 1.005–1.030)
UROBILINOGEN UA: 0.2 mg/dL (ref 0.0–1.0)

## 2015-03-15 LAB — COMPREHENSIVE METABOLIC PANEL
ALBUMIN: 2.8 g/dL — AB (ref 3.5–5.0)
ALK PHOS: 44 U/L (ref 38–126)
ALT: 33 U/L (ref 14–54)
AST: 49 U/L — ABNORMAL HIGH (ref 15–41)
Anion gap: 11 (ref 5–15)
BILIRUBIN TOTAL: 1.1 mg/dL (ref 0.3–1.2)
BUN: 67 mg/dL — ABNORMAL HIGH (ref 6–20)
CALCIUM: 7.5 mg/dL — AB (ref 8.9–10.3)
CO2: 27 mmol/L (ref 22–32)
CREATININE: 1.96 mg/dL — AB (ref 0.44–1.00)
Chloride: 96 mmol/L — ABNORMAL LOW (ref 101–111)
GFR calc non Af Amer: 27 mL/min — ABNORMAL LOW (ref >60)
GFR, EST AFRICAN AMERICAN: 32 mL/min — AB (ref >60)
GLUCOSE: 126 mg/dL — AB (ref 65–99)
Potassium: 2.4 mmol/L — CL (ref 3.5–5.1)
Sodium: 134 mmol/L — ABNORMAL LOW (ref 135–145)
Total Protein: 6.8 g/dL (ref 6.5–8.1)

## 2015-03-15 LAB — URINE MICROSCOPIC-ADD ON

## 2015-03-15 LAB — CBC
HCT: 27.5 % — ABNORMAL LOW (ref 36.0–46.0)
HEMOGLOBIN: 9.5 g/dL — AB (ref 12.0–15.0)
MCH: 28.2 pg (ref 26.0–34.0)
MCHC: 34.5 g/dL (ref 30.0–36.0)
MCV: 81.6 fL (ref 78.0–100.0)
PLATELETS: 129 10*3/uL — AB (ref 150–400)
RBC: 3.37 MIL/uL — ABNORMAL LOW (ref 3.87–5.11)
RDW: 17.4 % — AB (ref 11.5–15.5)
WBC: 10.2 10*3/uL (ref 4.0–10.5)

## 2015-03-15 LAB — RAPID URINE DRUG SCREEN, HOSP PERFORMED
Amphetamines: NOT DETECTED
BARBITURATES: NOT DETECTED
Benzodiazepines: NOT DETECTED
Cocaine: NOT DETECTED
Opiates: NOT DETECTED
TETRAHYDROCANNABINOL: NOT DETECTED

## 2015-03-15 LAB — MRSA PCR SCREENING: MRSA by PCR: NEGATIVE

## 2015-03-15 LAB — APTT: aPTT: 28 seconds (ref 24–37)

## 2015-03-15 LAB — PROTIME-INR
INR: 1.28 (ref 0.00–1.49)
PROTHROMBIN TIME: 16.1 s — AB (ref 11.6–15.2)

## 2015-03-15 LAB — AMMONIA: Ammonia: 25 umol/L (ref 9–35)

## 2015-03-15 MED ORDER — THIAMINE HCL 100 MG/ML IJ SOLN
100.0000 mg | Freq: Every day | INTRAMUSCULAR | Status: DC
  Administered 2015-03-16 – 2015-03-17 (×2): 100 mg via INTRAVENOUS
  Filled 2015-03-15 (×2): qty 1
  Filled 2015-03-15: qty 2
  Filled 2015-03-15 (×2): qty 1
  Filled 2015-03-15: qty 2
  Filled 2015-03-15: qty 1

## 2015-03-15 MED ORDER — POTASSIUM CHLORIDE IN NACL 20-0.9 MEQ/L-% IV SOLN
Freq: Once | INTRAVENOUS | Status: AC
  Administered 2015-03-15: 23:00:00 via INTRAVENOUS
  Filled 2015-03-15: qty 1000

## 2015-03-15 MED ORDER — SERTRALINE HCL 100 MG PO TABS
100.0000 mg | ORAL_TABLET | Freq: Every day | ORAL | Status: DC
  Administered 2015-03-16 – 2015-03-21 (×5): 100 mg via ORAL
  Filled 2015-03-15 (×6): qty 1

## 2015-03-15 MED ORDER — LORAZEPAM 1 MG PO TABS
1.0000 mg | ORAL_TABLET | Freq: Four times a day (QID) | ORAL | Status: AC | PRN
  Administered 2015-03-18: 1 mg via ORAL
  Filled 2015-03-15: qty 1

## 2015-03-15 MED ORDER — DIPHENHYDRAMINE HCL 50 MG/ML IJ SOLN
25.0000 mg | Freq: Once | INTRAMUSCULAR | Status: DC
  Administered 2015-03-15: 25 mg via INTRAVENOUS
  Filled 2015-03-15: qty 1

## 2015-03-15 MED ORDER — NICOTINE 21 MG/24HR TD PT24
21.0000 mg | MEDICATED_PATCH | Freq: Every day | TRANSDERMAL | Status: DC
  Administered 2015-03-15 – 2015-03-22 (×8): 21 mg via TRANSDERMAL
  Filled 2015-03-15 (×8): qty 1

## 2015-03-15 MED ORDER — POTASSIUM CHLORIDE 10 MEQ/100ML IV SOLN
10.0000 meq | INTRAVENOUS | Status: AC
  Administered 2015-03-15 – 2015-03-16 (×2): 10 meq via INTRAVENOUS
  Filled 2015-03-15: qty 100

## 2015-03-15 MED ORDER — POTASSIUM CHLORIDE 10 MEQ/100ML IV SOLN
INTRAVENOUS | Status: AC
  Filled 2015-03-15: qty 100

## 2015-03-15 MED ORDER — SODIUM CHLORIDE 0.9 % IV BOLUS (SEPSIS)
1000.0000 mL | Freq: Once | INTRAVENOUS | Status: AC
  Administered 2015-03-15: 1000 mL via INTRAVENOUS

## 2015-03-15 MED ORDER — ONDANSETRON HCL 4 MG/2ML IJ SOLN: 4.0000 mg | Freq: Four times a day (QID) | INTRAMUSCULAR | Status: DC | PRN

## 2015-03-15 MED ORDER — LORAZEPAM 2 MG/ML IJ SOLN
1.0000 mg | Freq: Four times a day (QID) | INTRAMUSCULAR | Status: AC | PRN
Start: 2015-03-15 — End: 2015-03-18

## 2015-03-15 MED ORDER — HYDRALAZINE HCL 20 MG/ML IJ SOLN: 5.0000 mg | INTRAMUSCULAR | Status: DC | PRN

## 2015-03-15 MED ORDER — SODIUM CHLORIDE 0.9 % IV SOLN
8.0000 mg/h | INTRAVENOUS | Status: AC
  Administered 2015-03-15 – 2015-03-18 (×7): 8 mg/h via INTRAVENOUS
  Filled 2015-03-15 (×13): qty 80

## 2015-03-15 MED ORDER — TRAZODONE HCL 100 MG PO TABS
100.0000 mg | ORAL_TABLET | Freq: Every day | ORAL | Status: DC
  Administered 2015-03-16 – 2015-03-22 (×7): 100 mg via ORAL
  Filled 2015-03-15 (×4): qty 1
  Filled 2015-03-15 (×2): qty 2
  Filled 2015-03-15: qty 1
  Filled 2015-03-15: qty 2

## 2015-03-15 MED ORDER — PANTOPRAZOLE SODIUM 40 MG IV SOLR
40.0000 mg | Freq: Once | INTRAVENOUS | Status: AC
  Administered 2015-03-15: 40 mg via INTRAVENOUS
  Filled 2015-03-15: qty 40

## 2015-03-15 MED ORDER — MORPHINE SULFATE (PF) 2 MG/ML IV SOLN
1.0000 mg | INTRAVENOUS | Status: DC | PRN
  Administered 2015-03-17: 1 mg via INTRAVENOUS
  Filled 2015-03-15: qty 1

## 2015-03-15 MED ORDER — SODIUM CHLORIDE 0.9 % IV SOLN
25.0000 ug/h | INTRAVENOUS | Status: DC
  Administered 2015-03-15 – 2015-03-16 (×2): 25 ug/h via INTRAVENOUS
  Administered 2015-03-17: 50 ug/h via INTRAVENOUS
  Filled 2015-03-15 (×8): qty 1

## 2015-03-15 MED ORDER — OCTREOTIDE LOAD VIA INFUSION
50.0000 ug | Freq: Once | INTRAVENOUS | Status: AC
  Administered 2015-03-15: 50 ug via INTRAVENOUS
  Filled 2015-03-15: qty 25

## 2015-03-15 MED ORDER — ACETAMINOPHEN 325 MG PO TABS: 650.0000 mg | ORAL_TABLET | Freq: Four times a day (QID) | ORAL | Status: DC | PRN

## 2015-03-15 MED ORDER — ONDANSETRON HCL 4 MG/2ML IJ SOLN
4.0000 mg | Freq: Once | INTRAMUSCULAR | Status: AC
  Administered 2015-03-15: 4 mg via INTRAVENOUS
  Filled 2015-03-15: qty 2

## 2015-03-15 MED ORDER — ACETAMINOPHEN 650 MG RE SUPP: 650.0000 mg | Freq: Four times a day (QID) | RECTAL | Status: DC | PRN

## 2015-03-15 MED ORDER — ARIPIPRAZOLE 5 MG PO TABS
5.0000 mg | ORAL_TABLET | Freq: Every day | ORAL | Status: DC
  Administered 2015-03-16 – 2015-03-22 (×7): 5 mg via ORAL
  Filled 2015-03-15 (×10): qty 1

## 2015-03-15 MED ORDER — LORAZEPAM 2 MG/ML IJ SOLN
0.0000 mg | Freq: Four times a day (QID) | INTRAMUSCULAR | Status: AC
  Administered 2015-03-15: 2 mg via INTRAVENOUS
  Filled 2015-03-15: qty 1

## 2015-03-15 MED ORDER — FOLIC ACID 1 MG PO TABS
1.0000 mg | ORAL_TABLET | Freq: Every day | ORAL | Status: DC
  Administered 2015-03-16 – 2015-03-22 (×7): 1 mg via ORAL
  Filled 2015-03-15 (×8): qty 1

## 2015-03-15 MED ORDER — LORAZEPAM 2 MG/ML IJ SOLN
0.0000 mg | Freq: Two times a day (BID) | INTRAMUSCULAR | Status: AC
  Administered 2015-03-18: 2 mg via INTRAVENOUS
  Filled 2015-03-15: qty 1

## 2015-03-15 MED ORDER — ONDANSETRON HCL 4 MG/2ML IJ SOLN
4.0000 mg | Freq: Once | INTRAMUSCULAR | Status: DC
  Administered 2015-03-15: 4 mg via INTRAVENOUS
  Filled 2015-03-15: qty 2

## 2015-03-15 MED ORDER — SODIUM CHLORIDE 0.9 % IV BOLUS (SEPSIS)
1000.0000 mL | Freq: Once | INTRAVENOUS | Status: AC
  Administered 2015-03-16: 1000 mL via INTRAVENOUS

## 2015-03-15 MED ORDER — POTASSIUM CHLORIDE 20 MEQ/15ML (10%) PO SOLN: 40.0000 meq | Freq: Once | ORAL | Status: DC

## 2015-03-15 MED ORDER — ONDANSETRON HCL 4 MG PO TABS: 4.0000 mg | ORAL_TABLET | Freq: Four times a day (QID) | ORAL | Status: DC | PRN

## 2015-03-15 MED ORDER — SODIUM CHLORIDE 0.9 % IJ SOLN
3.0000 mL | Freq: Two times a day (BID) | INTRAMUSCULAR | Status: DC
  Administered 2015-03-16 – 2015-03-22 (×4): 3 mL via INTRAVENOUS

## 2015-03-15 MED ORDER — PANTOPRAZOLE SODIUM 40 MG IV SOLR
40.0000 mg | Freq: Two times a day (BID) | INTRAVENOUS | Status: DC
  Administered 2015-03-19 – 2015-03-22 (×7): 40 mg via INTRAVENOUS
  Filled 2015-03-15 (×8): qty 40

## 2015-03-15 MED ORDER — VITAMIN B-1 100 MG PO TABS
100.0000 mg | ORAL_TABLET | Freq: Every day | ORAL | Status: DC
  Administered 2015-03-18 – 2015-03-22 (×5): 100 mg via ORAL
  Filled 2015-03-15 (×6): qty 1

## 2015-03-15 MED ORDER — PANTOPRAZOLE SODIUM 40 MG IV SOLR
40.0000 mg | Freq: Once | INTRAVENOUS | Status: AC
  Administered 2015-03-16: 40 mg via INTRAVENOUS
  Filled 2015-03-15: qty 40

## 2015-03-15 NOTE — H&P (Addendum)
[facility] History and Physical  Marissa Brooks GHW:[id number] DOB: 05/17/1956 DOA: 03/15/2015  Referring physician: ED physician PCP: Barbette Merino, MD  Specialists:   Chief Complaint: Nausea, vomiting, diarrhea, hematemesis  HPI: Marissa Brooks is a 59 y.o. female with PMH of hypertension, GERD, depression, anxiety, polysubstance abuse including tobacco, alcohol and cocaine abuse, HCV, breast cancer, who presents with nausea, vomiting, diarrhea and hematemesis.  Patient reports that she started having nausea, vomiting, diarrhea, hematemesis since last night. She vomited many times of coffee ground material. She does not have abdominal pain. She has diarrhea with loose stool for many times, but she did not look stool, therefore she is not sure if there is blood in the stools or not. She has subjective fever, but no chills. No chest pain, shortness breath, cough, rashes, symptoms of UTI, unilateral weakness. She states that sometimes she chokes when she eats food or drinks fluid.  In ED, patient was found to have hemoglobin dropped from 11.1 on 03/03/15-->9.3, WBC 8.7, platelets 128, temperature 99.7, potassium 2.4, AKI with creatinine 1.96, BUN 67. Patient is admitted to inpatient for further intervention and treatment. GI was consulted by ED.  Where does patient live?   At home with friend Can patient participate in ADLs?   Barely    Review of Systems:   General: has subjective fevers, no chills, no changes in body weight, has poor appetite, has fatigue HEENT: no blurry vision, hearing changes or sore throat Pulm: no dyspnea, coughing, wheezing. Has chocking. CV: no chest pain, palpitations Abd: has nausea, vomiting, diarrhea, no constipation, abdominal pain, GU: no dysuria, burning on urination, increased urinary frequency, hematuria  Ext: no leg edema Neuro: no unilateral weakness, numbness, or tingling, no vision change or hearing loss Skin: no rash MSK: No muscle spasm, no  deformity, no limitation of range of movement in spin Heme: No easy bruising.  Travel history: No recent long distant travel.  Allergy:  Allergies  Allergen Reactions  . Centrum Hives    Past Medical History  Diagnosis Date  . Hypertension   . Shortness of breath dyspnea     with walking  . Pneumonia   . Anxiety   . Depression   . Bipolar disorder ([place])   . GERD (gastroesophageal reflux disease)   . Arthritis   . Cancer [facility]) 2002    left breast cancer tx in [place], right breast cancer 08/2014  . ETOH abuse     as of 10/21/14 none for a week  . Cocaine abuse     last use 10/13/14  . Breast cancer ([place]) 09/10/14    right breast,hx left breast ca,2002 [place]  . Allergy   . Tobacco abuse   . HCV (hepatitis C virus)     Past Surgical History  Procedure Laterality Date  . Mastectomy      and Implant  . Mandible fracture surgery    . Breast surgery  2002    left mastectomy with implant  . Tonsillectomy    . Mastectomy w/ sentinel node biopsy Right 10/30/2014  . Simple mastectomy with axillary sentinel node biopsy Right 10/30/2014    Procedure: RIGHT MASTECTOMY WITH SENTINEL NODE MAPPING;  Surgeon: Autumn Messing III, MD;  Location: [place];  Service: General;  Laterality: Right;    Social History:  reports that she has been smoking Cigarettes.  She has been smoking about 0.25 packs per day. She has never used smokeless tobacco. She reports that she drinks alcohol. She reports  that she uses illicit drugs (Cocaine and Marijuana).  Family History:  Family History  Problem Relation Age of Onset  . Breast cancer Sister 102  . Mental illness Mother   . HIV/AIDS Sister     Deceased     Prior to Admission medications   Medication Sig Start Date End Date Taking? Authorizing Provider  ARIPiprazole (ABILIFY) 5 MG tablet Take 1 tablet (5 mg total) by mouth daily. For mood control 07/12/13   Encarnacion Slates, NP  clindamycin (CLEOCIN) 300 MG capsule Take 1 capsule (300 mg total)  by mouth 4 (four) times daily. X 7 days 02/21/15   Malvin Johns, MD  dexamethasone (DECADRON) 4 MG tablet Take 2 tab daily the day before chemo, and daily after chemo for 3 days 02/10/15   Truitt Merle, MD  lisinopril (PRINIVIL,ZESTRIL) 10 MG tablet Take 10 mg by mouth daily.    Historical Provider, MD  metroNIDAZOLE (FLAGYL) 500 MG tablet Take 1 tablet (500 mg total) by mouth 2 (two) times daily. One po bid x 7 days 02/21/15   Malvin Johns, MD  naproxen (NAPROSYN) 500 MG tablet Take 500 mg by mouth 2 (two) times daily as needed for moderate pain.    Historical Provider, MD  ondansetron (ZOFRAN) 8 MG tablet TAKE 1 TABLET (8 MG TOTAL) BY MOUTH TWO   (TWO) TIMES DAILY. START THE DAY AFTER CHEMO FOR THREE   DAYS. THEN TAKE AS NEEDED FOR NAUSEA OR V 02/13/15   Truitt Merle, MD  sertraline (ZOLOFT) 100 MG tablet Take 1 tablet (100 mg total) by mouth at bedtime. For depression 07/12/13   Encarnacion Slates, NP  traZODone (DESYREL) 100 MG tablet Take 1 tablet (100 mg total) by mouth at bedtime. For sleep Patient taking differently: Take 100 mg by mouth daily. For sleep 07/12/13   Encarnacion Slates, NP    Physical Exam: Filed Vitals:   03/15/15 1844 03/15/15 1921  BP: 121/65 114/61  Pulse: 97 89  Temp: 99.7 F (37.6 C)   TempSrc: Oral   Resp: 20 19  SpO2: 100% 99%   General: Not in acute distress. Very tired looking. HEENT:       Eyes: PERRL, EOMI, no scleral icterus.       ENT: No discharge from the ears and nose, no pharynx injection, no tonsillar enlargement.        Neck: No JVD, no bruit, no mass felt. Heme: No neck lymph node enlargement. Cardiac: S1/S2, RRR, No murmurs, No gallops or rubs. Pulm: No rales, wheezing, rhonchi or rubs. Abd: Soft, nondistended, nontender, no rebound pain, no organomegaly, BS present. Ext: No pitting leg edema bilaterally. 2+DP/PT pulse bilaterally. Musculoskeletal: No joint deformities, No joint redness or warmth, no limitation of ROM in spin. Skin: No rashes.  Neuro: Alert,  oriented X3, cranial nerves II-XII grossly intact, muscle strength 5/5 in all extremities, sensation to light touch intact.  Psych: Patient is not psychotic, no suicidal or hemocidal ideation.  Labs on Admission:  Basic Metabolic Panel:  Recent Labs Lab 03/15/15 1910  NA 134*  K 2.4*  CL 96*  CO2 27  GLUCOSE 126*  BUN 67*  CREATININE 1.96*  CALCIUM 7.5*   Liver Function Tests:  Recent Labs Lab 03/15/15 1910  AST 49*  ALT 33  ALKPHOS 44  BILITOT 1.1  PROT 6.8  ALBUMIN 2.8*   No results for input(s): LIPASE, AMYLASE in the last 168 hours. No results for input(s): AMMONIA in the last 168 hours.  CBC:  Recent Labs Lab 03/15/15 1910  WBC 8.7  NEUTROABS 6.7  HGB 9.3*  HCT 27.3*  MCV 82.5  PLT 128*   Cardiac Enzymes: No results for input(s): CKTOTAL, CKMB, CKMBINDEX, TROPONINI in the last 168 hours.  BNP (last 3 results) No results for input(s): BNP in the last 8760 hours.  ProBNP (last 3 results) No results for input(s): PROBNP in the last 8760 hours.  CBG: No results for input(s): GLUCAP in the last 168 hours.  Radiological Exams on Admission: No results found.  EKG: Not done in ED, will get one.   Assessment/Plan Principal Problem:   GIB (gastrointestinal bleeding) Active Problems:   HCV (hepatitis C virus)   THROMBOCYTOPENIA   Substance abuse   BACK PAIN, LUMBAR   NEOPLASM, MALIGNANT, BREAST, HX OF   Alcohol dependence ([place])   MDD (major depressive disorder) (HCC)   Tobacco abuse   AKI (acute kidney injury) ([place])   Hypokalemia   Essential hypertension   Depression  GIB (gastrointestinal bleeding): Patient has history of alcoholic abuse, HVC and cirrhosis, it is likely that the patient developed esophageal varices which consistent with his hematemesis. Another potential differential diagnoses is gastric or duodenal ulcer given her Naproxen use. hgb dropped from 11.1-->9.3. Currently, hemodynamically stable. GI was consulted by ED. Pt was  scheduled to have colonoscope on 06/10/10 by Dr.  Earlie Raveling, which seemed to have not completed?   Plan:  - will admit to SDU - NPO - IVF: 2L NS, then 100 cc mL/hr - Start IV pantoprazole infusion - Zofran IV for nausea - hold Naproxen - Maintain IV access (2 large bore IVs if possible). - Monitor closely and follow q6h cbc, transfuse as necessary. - LaB: INR, PTT, type and cross -Octreotide infusion -GI consulted by Ed, will follow up recommendations -SLP for chocking  Nausea, vomiting, diarrhea: No abdominal pain. Not sure patient's diarrhea is due to bloody diarrhea are not. -check c diff pcr -IVF as above -FOBT   THROMBOCYTOPENIA: likely due to cirrhosis. plalete 128 now. -follow up by cbc  Tobacco abuse and Alcohol abuse: -Did counseling about importance of quitting smoking -Nicotine patch -Did counseling about the importance of quitting drinking -CIWA protocol  Hx of cocaine abuse: -Did counseling about importance of quitting -check UDS  Breast CA: She has been followed up by Dr. Burr Medico. 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 She is currently receiving chemotherapy started on 03/03/15, supposed to do 4 cycles every 3 week. -follow with Dr. Burr Medico  History of cirrhosis and HCV infection: per Dr. Ernestina Penna note, no imaging or additional workup found in our system, though noted to have undergone treatment for HCV in 1999 at [facility] per note by Aurora Mask [06/02/09]. Her HCV VL was [id number] on 12/16/14.  -Plan was to refer her to liver clinic when she completes adjuvant chemo per Dr. Burr Medico -check ammonia level  Hypertension:  -Hold lisinopril due to AKI -IV hydralazine  Hypokalemia: K= 2.4 on admission. - Repleted - Check Mg level  AKI: Likely due to prerenal  secondary to dehydration and continuation of ACE and NSAIDs - IVF as above - Check FeNa - US-renal - Follow up renal function by BMP - Hold ACEI and NSAIDs - check UA  Depression and anxiety: Stable, no suicidal or homicidal ideations. -Continue  home medications: Abilify and Zoloft   DVT ppx: SCD  Code Status: Full code Family Communication: None at bed side.     Disposition Plan: Admit to inpatient   Date of Service 03/15/2015    Ivor Costa [facility] Pager [phone]  If 7PM-7AM, please contact night-coverage [email or web address] Password [email or web address] 03/15/2015, 9:36 PM

## 2015-03-15 NOTE — ED Provider Notes (Signed)
CSN: [id number]     Arrival date & time 03/15/15  1811 History   First MD Initiated Contact with Patient 03/15/15 1824     Chief Complaint  Patient presents with  . Hematemesis     (Consider location/radiation/quality/duration/timing/severity/associated sxs/prior Treatment) The history is provided by the patient. No language interpreter was used.   [name] is a 59 year old female with a history of breast cancer (on chemotherapy), hypertension, GERD, alcohol abuse, and cocaine abuse who presents by EMS for vomiting 3 days. She states she has had bright red vomit since last night and diarrhea but without blood. Her last chemotherapy was 12 days ago. She was seen by Dr. Burr Medico, her oncologist one week ago. She smokes one pack per week. Per her oncology note, her last cocaine use was one month ago. She admits to doing heroin in the past. She denies any recent illness, fever, chills, chest pain, shortness of breath, abdominal pain, dysuria, vaginal bleeding or discharge. Past Medical History  Diagnosis Date  . Hypertension   . Shortness of breath dyspnea     with walking  . Pneumonia   . Anxiety   . Depression   . Bipolar disorder ([place])   . GERD (gastroesophageal reflux disease)   . Arthritis   . Cancer [facility]) 2002    left breast cancer tx in [place], right breast cancer 08/2014  . ETOH abuse     as of 10/21/14 none for a week  . Cocaine abuse     last use 10/13/14  . Breast cancer ([place]) 09/10/14    right breast,hx left breast ca,2002 [place]  . Allergy   . Tobacco abuse   . HCV (hepatitis C virus)    Past Surgical History  Procedure Laterality Date  . Mastectomy      and Implant  . Mandible fracture surgery    . Breast surgery  2002    left mastectomy with implant  . Tonsillectomy    . Mastectomy w/ sentinel node biopsy Right 10/30/2014  . Simple mastectomy with axillary sentinel node biopsy Right 10/30/2014    Procedure: RIGHT MASTECTOMY WITH SENTINEL NODE  MAPPING;  Surgeon: Autumn Messing III, MD;  Location: [place];  Service: General;  Laterality: Right;   Family History  Problem Relation Age of Onset  . Breast cancer Sister 47  . Mental illness Mother   . HIV/AIDS Sister     Deceased   Social History  Substance Use Topics  . Smoking status: Current Every Day Smoker -- 0.25 packs/day    Types: Cigarettes  . Smokeless tobacco: Never Used  . Alcohol Use: Yes     Comment: was a heavy drinker (daily) none for a week (as of 10/21/14.   OB History    No data available     Review of Systems  Constitutional: Negative for fever and chills.  Respiratory: Positive for cough. Negative for shortness of breath.   Cardiovascular: Negative for chest pain.  Gastrointestinal: Positive for nausea, vomiting and diarrhea. Negative for abdominal pain.  Genitourinary: Negative for vaginal bleeding.  All other systems reviewed and are negative.     Allergies  Centrum  Home Medications   Prior to Admission medications   Medication Sig Start Date End Date Taking? Authorizing Provider  ARIPiprazole (ABILIFY) 5 MG tablet Take 1 tablet (5 mg total) by mouth daily. For mood control 07/12/13  Yes Encarnacion Slates, NP  dexamethasone (DECADRON) 4 MG tablet Take 2 tab daily the day before  chemo, and daily after chemo for 3 days 02/10/15  Yes Truitt Merle, MD  lisinopril (PRINIVIL,ZESTRIL) 10 MG tablet Take 10 mg by mouth daily.   Yes Historical Provider, MD  naproxen (NAPROSYN) 500 MG tablet Take 500 mg by mouth 2 (two) times daily as needed for moderate pain.   Yes Historical Provider, MD  ondansetron (ZOFRAN) 8 MG tablet TAKE 1 TABLET (8 MG TOTAL) BY MOUTH TWO   (TWO) TIMES DAILY. START THE DAY AFTER CHEMO FOR THREE   DAYS. THEN TAKE AS NEEDED FOR NAUSEA OR V 02/13/15  Yes Truitt Merle, MD  sertraline (ZOLOFT) 100 MG tablet Take 1 tablet (100 mg total) by mouth at bedtime. For depression 07/12/13  Yes Encarnacion Slates, NP  traZODone (DESYREL) 100 MG tablet Take 1 tablet (100 mg  total) by mouth at bedtime. For sleep Patient taking differently: Take 100 mg by mouth daily. For sleep 07/12/13  Yes Encarnacion Slates, NP  clindamycin (CLEOCIN) 300 MG capsule Take 1 capsule (300 mg total) by mouth 4 (four) times daily. X 7 days Patient not taking: Reported on 03/15/2015 02/21/15   Malvin Johns, MD  metroNIDAZOLE (FLAGYL) 500 MG tablet Take 1 tablet (500 mg total) by mouth 2 (two) times daily. One po bid x 7 days Patient not taking: Reported on 03/15/2015 02/21/15   Malvin Johns, MD   BP 114/61 mmHg  Pulse 89  Temp(Src) 99.7 F (37.6 C) (Oral)  Resp 19  SpO2 99% Physical Exam  Constitutional: She is oriented to person, place, and time. She appears well-developed and well-nourished.  She is sickly appearing. She is spitting up bright red blood.  HENT:  Head: Normocephalic and atraumatic.  Eyes: Conjunctivae are normal.  Neck: Normal range of motion. Neck supple.  Cardiovascular: Normal rate, regular rhythm and normal heart sounds.   Pulmonary/Chest: Effort normal and breath sounds normal. No respiratory distress. She has no wheezes. She has no rales.  Lungs clear to auscultation bilaterally. No decreased breath sounds or wheezing.  Abdominal: Soft. She exhibits no distension. There is no tenderness.  Abdomen is soft and nontender. No distention. No guarding or rebound.  Musculoskeletal: Normal range of motion.  Neurological: She is alert and oriented to person, place, and time.  Skin: Skin is warm and dry.  Nursing note and vitals reviewed.   ED Course  Procedures (including critical care time) Labs Review Labs Reviewed  CBC WITH DIFFERENTIAL/PLATELET - Abnormal; Notable for the following:    RBC 3.31 (*)    Hemoglobin 9.3 (*)    HCT 27.3 (*)    RDW 17.3 (*)    Platelets 128 (*)    Lymphs Abs 0.5 (*)    Monocytes Absolute 1.5 (*)    All other components within normal limits  COMPREHENSIVE METABOLIC PANEL - Abnormal; Notable for the following:    Sodium 134  (*)    Potassium 2.4 (*)    Chloride 96 (*)    Glucose, Bld 126 (*)    BUN 67 (*)    Creatinine, Ser 1.96 (*)    Calcium 7.5 (*)    Albumin 2.8 (*)    AST 49 (*)    GFR calc non Af Amer 27 (*)    GFR calc Af Amer 32 (*)    All other components within normal limits  URINALYSIS, ROUTINE W REFLEX MICROSCOPIC (NOT AT [facility]) - Abnormal; Notable for the following:    Color, Urine AMBER (*)    APPearance CLOUDY (*)  Hgb urine dipstick MODERATE (*)    Protein, ur 30 (*)    Leukocytes, UA TRACE (*)    All other components within normal limits  CBC - Abnormal; Notable for the following:    RBC 3.37 (*)    Hemoglobin 9.5 (*)    HCT 27.5 (*)    RDW 17.4 (*)    Platelets 129 (*)    All other components within normal limits  URINE MICROSCOPIC-ADD ON - Abnormal; Notable for the following:    Bacteria, UA MANY (*)    Casts GRANULAR CAST (*)    All other components within normal limits  PROTIME-INR - Abnormal; Notable for the following:    Prothrombin Time 16.1 (*)    All other components within normal limits  C DIFFICILE QUICK SCREEN W PCR REFLEX  MRSA PCR SCREENING  URINE RAPID DRUG SCREEN, HOSP PERFORMED  AMMONIA  APTT  CBC  CBC  MAGNESIUM  HIV ANTIBODY (ROUTINE TESTING)  CREATININE, URINE, RANDOM  SODIUM, URINE, RANDOM  COMPREHENSIVE METABOLIC PANEL  CBC  TYPE AND SCREEN  ABO/RH    Imaging Review No results found. I have personally reviewed and evaluated these images and lab results as part of my medical decision-making.   EKG Interpretation None      MDM   Final diagnoses:  Acute kidney injury (HCC)  Hypokalemia  Upper GI bleed  Essential hypertension  Gastroesophageal reflux disease without esophagitis  Anxiety  AKI (acute kidney injury) ([place])  Patient presents for hematemesis since last night. Last chemotherapy was 12 days ago. She is sickly appearing but vitals are stable. She is actively vomiting bright red blood. Her labs show that she is hypokalemic,  anemic, and has acute kidney injury.  Oncologist: Dr. Burr Medico I spoke to Dr. Blaine Hamper regarding admission and he requested GI consult. I consulted GI regarding this patient.    Ottie Glazier, PA-C 03/15/15 2254  Nat Christen, MD 03/15/15 [phone]

## 2015-03-15 NOTE — ED Notes (Signed)
Delay in blood draw, until pt gets more fluids

## 2015-03-15 NOTE — ED Notes (Signed)
EMS reports pt has been vomiting x 3 days, last night started to vomit blood with blood with clots. Hx breast Ca, chemo 3 weeks ago, HTN, Chest discomfort with vomiting.

## 2015-03-15 NOTE — ED Notes (Signed)
Bed: RP39 Expected date: 03/15/15 Expected time: 6:08 PM Means of arrival: Ambulance Comments: Vomiting blood, breast ca

## 2015-03-16 ENCOUNTER — Ambulatory Visit: Admission: RE | Admit: 2015-03-16 | Payer: Medicare HMO | Source: Ambulatory Visit | Admitting: Radiation Oncology

## 2015-03-16 ENCOUNTER — Inpatient Hospital Stay (HOSPITAL_COMMUNITY): Payer: Medicare HMO

## 2015-03-16 ENCOUNTER — Ambulatory Visit: Admission: RE | Admit: 2015-03-16 | Payer: Medicare HMO | Source: Ambulatory Visit

## 2015-03-16 DIAGNOSIS — R1310 Dysphagia, unspecified: Secondary | ICD-10-CM

## 2015-03-16 DIAGNOSIS — B1920 Unspecified viral hepatitis C without hepatic coma: Secondary | ICD-10-CM

## 2015-03-16 DIAGNOSIS — D62 Acute posthemorrhagic anemia: Secondary | ICD-10-CM | POA: Diagnosis present

## 2015-03-16 DIAGNOSIS — F329 Major depressive disorder, single episode, unspecified: Secondary | ICD-10-CM

## 2015-03-16 DIAGNOSIS — F1020 Alcohol dependence, uncomplicated: Secondary | ICD-10-CM

## 2015-03-16 LAB — CBC
HCT: 22.1 % — ABNORMAL LOW (ref 36.0–46.0)
HEMATOCRIT: 22 % — AB (ref 36.0–46.0)
HEMATOCRIT: 26.3 % — AB (ref 36.0–46.0)
HEMOGLOBIN: 9 g/dL — AB (ref 12.0–15.0)
Hemoglobin: 7.4 g/dL — ABNORMAL LOW (ref 12.0–15.0)
Hemoglobin: 7.6 g/dL — ABNORMAL LOW (ref 12.0–15.0)
MCH: 27.7 pg (ref 26.0–34.0)
MCH: 28.8 pg (ref 26.0–34.0)
MCH: 28.8 pg (ref 26.0–34.0)
MCHC: 33.6 g/dL (ref 30.0–36.0)
MCHC: 34.4 g/dL (ref 30.0–36.0)
MCHC: 34.2 g/dL (ref 30.0–36.0)
MCV: 82.4 fL (ref 78.0–100.0)
MCV: 83.7 fL (ref 78.0–100.0)
MCV: 84.3 fL (ref 78.0–100.0)
PLATELETS: 101 10*3/uL — AB (ref 150–400)
PLATELETS: 114 10*3/uL — AB (ref 150–400)
PLATELETS: 114 10*3/uL — AB (ref 150–400)
RBC: 2.67 MIL/uL — ABNORMAL LOW (ref 3.87–5.11)
RBC: 2.64 MIL/uL — AB (ref 3.87–5.11)
RBC: 3.12 MIL/uL — AB (ref 3.87–5.11)
RDW: 17.8 % — AB (ref 11.5–15.5)
RDW: 18.2 % — ABNORMAL HIGH (ref 11.5–15.5)
RDW: 17.4 % — ABNORMAL HIGH (ref 11.5–15.5)
WBC: 7.3 10*3/uL (ref 4.0–10.5)
WBC: 8.3 10*3/uL (ref 4.0–10.5)
WBC: 12.5 10*3/uL — AB (ref 4.0–10.5)

## 2015-03-16 LAB — PREPARE RBC (CROSSMATCH)

## 2015-03-16 LAB — RETICULOCYTES
RBC.: 2.7 MIL/uL — ABNORMAL LOW (ref 3.87–5.11)
Retic Count, Absolute: 67.5 10*3/uL (ref 19.0–186.0)
Retic Ct Pct: 2.5 % (ref 0.4–3.1)

## 2015-03-16 LAB — COMPREHENSIVE METABOLIC PANEL
ALBUMIN: 2.5 g/dL — AB (ref 3.5–5.0)
ALT: 47 U/L (ref 14–54)
AST: 73 U/L — AB (ref 15–41)
Alkaline Phosphatase: 41 U/L (ref 38–126)
Anion gap: 11 (ref 5–15)
BILIRUBIN TOTAL: 1.2 mg/dL (ref 0.3–1.2)
BUN: 61 mg/dL — AB (ref 6–20)
CHLORIDE: 105 mmol/L (ref 101–111)
CO2: 22 mmol/L (ref 22–32)
CREATININE: 1.73 mg/dL — AB (ref 0.44–1.00)
Calcium: 6.9 mg/dL — ABNORMAL LOW (ref 8.9–10.3)
GFR calc Af Amer: 37 mL/min — ABNORMAL LOW (ref >60)
GFR, EST NON AFRICAN AMERICAN: 32 mL/min — AB (ref >60)
GLUCOSE: 123 mg/dL — AB (ref 65–99)
POTASSIUM: 2.9 mmol/L — AB (ref 3.5–5.1)
Sodium: 138 mmol/L (ref 135–145)
TOTAL PROTEIN: 5.8 g/dL — AB (ref 6.5–8.1)

## 2015-03-16 LAB — C DIFFICILE QUICK SCREEN W PCR REFLEX
C DIFFICLE (CDIFF) ANTIGEN: NEGATIVE
C Diff interpretation: NEGATIVE
C Diff toxin: NEGATIVE

## 2015-03-16 LAB — MAGNESIUM: Magnesium: 2.5 mg/dL — ABNORMAL HIGH (ref 1.7–2.4)

## 2015-03-16 LAB — IRON AND TIBC
IRON: 162 ug/dL (ref 28–170)
SATURATION RATIOS: 73 % — AB (ref 10.4–31.8)
TIBC: 221 ug/dL — AB (ref 250–450)
UIBC: 59 ug/dL

## 2015-03-16 LAB — GLUCOSE, CAPILLARY: Glucose-Capillary: 111 mg/dL — ABNORMAL HIGH (ref 65–99)

## 2015-03-16 LAB — FOLATE: Folate: 9.2 ng/mL (ref >5.9)

## 2015-03-16 LAB — VITAMIN B12: Vitamin B-12: 1333 pg/mL — ABNORMAL HIGH (ref 180–914)

## 2015-03-16 LAB — CREATININE, URINE, RANDOM: CREATININE, URINE: 154.23 mg/dL

## 2015-03-16 LAB — FERRITIN: FERRITIN: 2098 ng/mL — AB (ref 11–307)

## 2015-03-16 LAB — HIV ANTIBODY (ROUTINE TESTING W REFLEX): HIV Screen 4th Generation wRfx: NONREACTIVE

## 2015-03-16 LAB — ABO/RH: ABO/RH(D): A POS

## 2015-03-16 LAB — SODIUM, URINE, RANDOM: Sodium, Ur: <10 mmol/L

## 2015-03-16 MED ORDER — ACETAMINOPHEN 325 MG PO TABS
650.0000 mg | ORAL_TABLET | Freq: Once | ORAL | Status: AC
  Administered 2015-03-16: 650 mg via ORAL
  Filled 2015-03-16: qty 2

## 2015-03-16 MED ORDER — FUROSEMIDE 10 MG/ML IJ SOLN
20.0000 mg | Freq: Once | INTRAMUSCULAR | Status: AC
  Administered 2015-03-16: 20 mg via INTRAVENOUS
  Filled 2015-03-16: qty 2

## 2015-03-16 MED ORDER — SODIUM CHLORIDE 0.9 % IJ SOLN
10.0000 mL | INTRAMUSCULAR | Status: DC | PRN
  Administered 2015-03-18 – 2015-03-21 (×5): 10 mL
  Filled 2015-03-16 (×5): qty 40

## 2015-03-16 MED ORDER — BOOST / RESOURCE BREEZE PO LIQD
1.0000 | Freq: Three times a day (TID) | ORAL | Status: DC
  Administered 2015-03-16 – 2015-03-20 (×7): 1 via ORAL

## 2015-03-16 MED ORDER — SODIUM CHLORIDE 0.9 % IV SOLN
INTRAVENOUS | Status: DC
  Administered 2015-03-16 – 2015-03-17 (×3): via INTRAVENOUS
  Filled 2015-03-16 (×5): qty 1000

## 2015-03-16 MED ORDER — FUROSEMIDE 10 MG/ML IJ SOLN
INTRAMUSCULAR | Status: AC
  Administered 2015-03-16: 20 mg via INTRAVENOUS
  Filled 2015-03-16: qty 2

## 2015-03-16 MED ORDER — ACETAMINOPHEN 325 MG PO TABS
ORAL_TABLET | ORAL | Status: AC
  Filled 2015-03-16: qty 2

## 2015-03-16 MED ORDER — SODIUM CHLORIDE 0.9 % IV SOLN
Freq: Once | INTRAVENOUS | Status: AC
  Administered 2015-03-16: 11:00:00 via INTRAVENOUS

## 2015-03-16 MED ORDER — SODIUM CHLORIDE 0.9 % IJ SOLN
10.0000 mL | Freq: Two times a day (BID) | INTRAMUSCULAR | Status: DC
  Administered 2015-03-17 – 2015-03-19 (×3): 10 mL
  Administered 2015-03-20: 20 mL
  Administered 2015-03-22: 10 mL

## 2015-03-16 MED ORDER — DIPHENHYDRAMINE HCL 25 MG PO CAPS
ORAL_CAPSULE | ORAL | Status: AC
  Filled 2015-03-16: qty 1

## 2015-03-16 MED ORDER — POTASSIUM CHLORIDE CRYS ER 20 MEQ PO TBCR
40.0000 meq | EXTENDED_RELEASE_TABLET | ORAL | Status: AC
  Administered 2015-03-16 (×3): 40 meq via ORAL
  Filled 2015-03-16 (×3): qty 2

## 2015-03-16 MED ORDER — DIPHENHYDRAMINE HCL 25 MG PO CAPS
25.0000 mg | ORAL_CAPSULE | Freq: Once | ORAL | Status: AC
  Administered 2015-03-16: 25 mg via ORAL
  Filled 2015-03-16: qty 1

## 2015-03-16 NOTE — Progress Notes (Signed)
Peripherally Inserted Central Catheter/Midline Placement  The IV Nurse has discussed with the patient and/or persons authorized to consent for the patient, the purpose of this procedure and the potential benefits and risks involved with this procedure.  The benefits include less needle sticks, lab draws from the catheter and patient may be discharged home with the catheter.  Risks include, but not limited to, infection, bleeding, blood clot (thrombus formation), and puncture of an artery; nerve damage and irregular heat beat.  Alternatives to this procedure were also discussed.  PICC/Midline Placement Documentation  PICC Triple Lumen 50/72/25 PICC Left Basilic 43 cm 0 cm (Active)  Indication for Insertion or Continuance of Line Poor Vasculature-patient has had multiple peripheral attempts or PIVs lasting less than 24 hours 03/16/2015  6:00 PM  Exposed Catheter (cm) 0 cm 03/16/2015  6:00 PM  Dressing Change Due 03/23/15 03/16/2015  6:00 PM       [name] 03/16/2015, 6:18 PM

## 2015-03-16 NOTE — Consult Note (Signed)
Referring Provider: Dirk Dress EDP Primary Care Physician:  Barbette Merino, MD Primary Gastroenterologist:  None, unassigned  Reason for Consultation:  Hematemesis  HPI: Marissa Brooks is a 59 y.o. female with PMH of hypertension, GERD, depression, anxiety, polysubstance abuse including tobacco/alcohol/cocaine abuse, HCV, breast cancer.  She presented to [facility] ED on 10/23 with complaints nausea, vomiting, diarrhea, and hematemesis.  Patient reports that she started having nausea, vomiting, diarrhea, hematemesis starting Saturday night. She vomited many times of coffee ground material with some red blood as well. She does not have abdominal pain.  She says that the stool was dark black in color.  She tells me that she has some trouble swallowing, but that has just been for the past couple of days as well.  Says that stuff just does not seem to want to go down.  Per nursing report she had a small amount of blood tinged sputum/saliva at 2 AM but nothing since.  Had a small of dark stool.  Has history of left breast cancer 12 years ago and had chemo for that but no radiation.  Just finished chemo a couple of weeks ago for recurrent breast cancer in the right and was due to start radiation today.  In ED, patient was found to have hemoglobin dropped from 11.1 on 03/03/15-->9.3, platelets 128, temperature 99.7, potassium 2.4, AKI with creatinine 1.96, BUN 67.  BP was soft and she was tachy initially but improved with fluids and is now HDS.  Hgb down further this AM to 7.6 grams so is going to receive 2 units PRBC's.  Potassium is being replaced.  She has been started on PPI gtt and octreotide gtt empirically although she has no definite history of cirrhosis.  Admits that she was using some Naproxen, but not often and definitely not daily according to her report.  Past Medical History  Diagnosis Date  . Hypertension   . Shortness of breath dyspnea     with walking  . Pneumonia   . Anxiety   . Depression   .  Bipolar disorder ([place])   . GERD (gastroesophageal reflux disease)   . Arthritis   . Cancer [facility]) 2002    left breast cancer tx in [place], right breast cancer 08/2014  . ETOH abuse     as of 10/21/14 none for a week  . Cocaine abuse     last use 10/13/14  . Breast cancer ([place]) 09/10/14    right breast,hx left breast ca,2002 [place]  . Allergy   . Tobacco abuse   . HCV (hepatitis C virus)     Past Surgical History  Procedure Laterality Date  . Mastectomy      and Implant  . Mandible fracture surgery    . Breast surgery  2002    left mastectomy with implant  . Tonsillectomy    . Mastectomy w/ sentinel node biopsy Right 10/30/2014  . Simple mastectomy with axillary sentinel node biopsy Right 10/30/2014    Procedure: RIGHT MASTECTOMY WITH SENTINEL NODE MAPPING;  Surgeon: Autumn Messing III, MD;  Location: [place];  Service: General;  Laterality: Right;    Prior to Admission medications   Medication Sig Start Date End Date Taking? Authorizing Provider  ARIPiprazole (ABILIFY) 5 MG tablet Take 1 tablet (5 mg total) by mouth daily. For mood control 07/12/13  Yes Encarnacion Slates, NP  dexamethasone (DECADRON) 4 MG tablet Take 2 tab daily the day before chemo, and daily after chemo for 3 days 02/10/15  Yes Truitt Merle, MD  lisinopril (PRINIVIL,ZESTRIL) 10 MG tablet Take 10 mg by mouth daily.   Yes Historical Provider, MD  naproxen (NAPROSYN) 500 MG tablet Take 500 mg by mouth 2 (two) times daily as needed for moderate pain.   Yes Historical Provider, MD  ondansetron (ZOFRAN) 8 MG tablet TAKE 1 TABLET (8 MG TOTAL) BY MOUTH TWO   (TWO) TIMES DAILY. START THE DAY AFTER CHEMO FOR THREE   DAYS. THEN TAKE AS NEEDED FOR NAUSEA OR V 02/13/15  Yes Truitt Merle, MD  sertraline (ZOLOFT) 100 MG tablet Take 1 tablet (100 mg total) by mouth at bedtime. For depression 07/12/13  Yes Encarnacion Slates, NP  traZODone (DESYREL) 100 MG tablet Take 1 tablet (100 mg total) by mouth at bedtime. For sleep Patient taking  differently: Take 100 mg by mouth daily. For sleep 07/12/13  Yes Encarnacion Slates, NP  clindamycin (CLEOCIN) 300 MG capsule Take 1 capsule (300 mg total) by mouth 4 (four) times daily. X 7 days Patient not taking: Reported on 03/15/2015 02/21/15   Malvin Johns, MD  metroNIDAZOLE (FLAGYL) 500 MG tablet Take 1 tablet (500 mg total) by mouth 2 (two) times daily. One po bid x 7 days Patient not taking: Reported on 03/15/2015 02/21/15   Malvin Johns, MD    Current Facility-Administered Medications  Medication Dose Route Frequency Provider Last Rate Last Dose  . 0.9 %  sodium chloride infusion   Intravenous Once Irine Seal V, MD      . acetaminophen (TYLENOL) 325 MG tablet           . ARIPiprazole (ABILIFY) tablet 5 mg  5 mg Oral Daily Ivor Costa, MD   5 mg at 03/16/15 1002  . diphenhydrAMINE (BENADRYL) 25 mg capsule           . folic acid (FOLVITE) tablet 1 mg  1 mg Oral Daily Ivor Costa, MD   1 mg at 03/16/15 1002  . furosemide (LASIX) injection 20 mg  20 mg Intravenous Once Irine Seal V, MD      . hydrALAZINE (APRESOLINE) injection 5 mg  5 mg Intravenous Q2H PRN Ivor Costa, MD      . LORazepam (ATIVAN) injection 0-4 mg  0-4 mg Intravenous Q6H Ivor Costa, MD   2 mg at 03/15/15 2255   Followed by  . [START ON 03/17/2015] LORazepam (ATIVAN) injection 0-4 mg  0-4 mg Intravenous Q12H Ivor Costa, MD      . LORazepam (ATIVAN) tablet 1 mg  1 mg Oral Q6H PRN Ivor Costa, MD       Or  . LORazepam (ATIVAN) injection 1 mg  1 mg Intravenous Q6H PRN Ivor Costa, MD      . morphine 2 MG/ML injection 1 mg  1 mg Intravenous Q4H PRN Ivor Costa, MD      . nicotine (NICODERM CQ - dosed in mg/24 hours) patch 21 mg  21 mg Transdermal Daily Ivor Costa, MD   21 mg at 03/16/15 1005  . octreotide (SANDOSTATIN) 500 mcg in sodium chloride 0.9 % 250 mL (2 mcg/mL) infusion  25-50 mcg/hr Intravenous Continuous Ivor Costa, MD 12.5 mL/hr at 03/16/15 0100 25 mcg/hr at 03/16/15 0100  . ondansetron (ZOFRAN) tablet 4 mg  4 mg Oral Q6H  PRN Ivor Costa, MD       Or  . ondansetron [facility]) injection 4 mg  4 mg Intravenous Q6H PRN Ivor Costa, MD      . pantoprazole (PROTONIX) 80 mg  in sodium chloride 0.9 % 250 mL (0.32 mg/mL) infusion  8 mg/hr Intravenous Continuous Ivor Costa, MD 25 mL/hr at 03/16/15 0752 8 mg/hr at 03/16/15 0752  . [START ON 03/19/2015] pantoprazole (PROTONIX) injection 40 mg  40 mg Intravenous Q12H Ivor Costa, MD      . potassium chloride 20 MEQ/15ML (10%) solution 40 mEq  40 mEq Oral Once Ivor Costa, MD   40 mEq at 03/16/15 0022  . potassium chloride SA (K-DUR,KLOR-CON) CR tablet 40 mEq  40 mEq Oral Q4H Eugenie Filler, MD   40 mEq at 03/16/15 1001  . sertraline (ZOLOFT) tablet 100 mg  100 mg Oral QHS Ivor Costa, MD   100 mg at 03/16/15 0023  . sodium chloride 0.9 % 1,000 mL with potassium chloride 40 mEq infusion   Intravenous Continuous Eugenie Filler, MD 125 mL/hr at 03/16/15 0751    . sodium chloride 0.9 % injection 3 mL  3 mL Intravenous Q12H Ivor Costa, MD   3 mL at 03/15/15 2256  . thiamine (VITAMIN B-1) tablet 100 mg  100 mg Oral Daily Ivor Costa, MD   100 mg at 03/15/15 2253   Or  . thiamine (B-1) injection 100 mg  100 mg Intravenous Daily Ivor Costa, MD   100 mg at 03/16/15 1002  . traZODone (DESYREL) tablet 100 mg  100 mg Oral Daily Ivor Costa, MD   100 mg at 03/16/15 1001    Allergies as of 03/15/2015 - Review Complete 03/15/2015  Allergen Reaction Noted  . Centrum Hives 12/08/2010    Family History  Problem Relation Age of Onset  . Breast cancer Sister 90  . Mental illness Mother   . HIV/AIDS Sister     Deceased    Social History   Social History  . Marital Status: Widowed    Spouse Name: N/A  . Number of Children: N/A  . Years of Education: N/A   Occupational History  . Not on file.   Social History Main Topics  . Smoking status: Current Every Day Smoker -- 0.25 packs/day    Types: Cigarettes  . Smokeless tobacco: Never Used  . Alcohol Use: Yes     Comment: was a heavy drinker  (daily) none for a week (as of 10/21/14.  . Drug Use: Yes    Special: Cocaine, Marijuana     Comment: last use of cocaine ?10/13/14, last use of marijuana 3 weeks ago (as of 10/21/14)  . Sexual Activity: Not Currently    Birth Control/ Protection: Abstinence   Other Topics Concern  . Not on file   Social History Narrative    Review of Systems: Ten point ROS is O/W negative except as mentioned in HPI.  Physical Exam: Vital signs in last 24 hours: Temp:  [98.6 F (37 C)-99.7 F (37.6 C)] 98.6 F (37 C) (10/24 0800) Pulse Rate:  [89-115] 96 (10/24 0800) Resp:  [18-27] 19 (10/24 0800) BP: (99-130)/(47-105) 128/105 mmHg (10/24 0800) SpO2:  [98 %-100 %] 100 % (10/24 0800) Weight:  [177 lb 0.5 oz (80.3 kg)] 177 lb 0.5 oz (80.3 kg) (10/23 2230) Last BM Date: 03/15/15 General:  Alert, Well-developed, well-nourished, pleasant and cooperative in NAD Head:  Normocephalic and atraumatic.  Full head alopecia from chemo. Eyes:  Sclera clear, no icterus.  Conjunctiva pink. Ears:  Normal auditory acuity. Mouth:  No deformity or lesions.   Lungs:  Clear throughout to auscultation.  No wheezes, crackles, or rhonchi.  Heart:  Regular rate and rhythm;  no murmurs, clicks, rubs, or gallops. Abdomen:  Soft, non-distended.  BS present.  Non-tender. Rectal:  Deferred  Msk:  Symmetrical without gross deformities. Pulses:  Normal pulses noted. Extremities:  Without clubbing or edema. Neurologic:  Alert and  oriented x 4;  grossly normal neurologically. Skin:  Intact without significant lesions or rashes. Psych:  Alert and cooperative. Normal mood and affect.  Intake/Output from previous day: 10/23 0701 - 10/24 0700 In: 2256.3 [I.V.:1056.3; IV Piggyback:1200] Out: 550 [Urine:550]  Lab Results:  Recent Labs  03/15/15 2106 03/16/15 0320 03/16/15 0900  WBC 10.2 7.3 8.3  HGB 9.5* 7.4* 7.6*  HCT 27.5* 22.0* 22.1*  PLT 129* 101* 114*   BMET  Recent Labs  03/15/15 1910 03/16/15 0320  NA  134* 138  K 2.4* 2.9*  CL 96* 105  CO2 27 22  GLUCOSE 126* 123*  BUN 67* 61*  CREATININE 1.96* 1.73*  CALCIUM 7.5* 6.9*   LFT  Recent Labs  03/16/15 0320  PROT 5.8*  ALBUMIN 2.5*  AST 73*  ALT 47  ALKPHOS 41  BILITOT 1.2   PT/INR  Recent Labs  03/15/15 2227  LABPROT 16.1*  INR 1.28   IMPRESSION:  -Hematemesis/CGE:  Rule out ulcer disease vs varices vs esophagitis vs portal HTN gastropathy.  She does not have history of cirrhosis per se but does have long-standing HCV and ETOH abuse.  Platelets are low and she is slightly coagulopathic so may have some chronic liver disease. -Dysphagia:  Acute just for the past 2-3 days.  Likely related to vomiting illness.  No history of radiation although was supposed to start radiation treatments today for her breast cancer. -Breast cancer:  12 year ago and now again diagnosed just recently.  Just completed chemo treatments within the past week or so. -HCV:  Treated unsuccessfully in the past several years. -ETOH abuse and polysubstance abuse:  UDS was negative this admission.  Is on CIWA protocol. -Hypokalemia:  Replacement per primary service. -Acute blood loss anemia:  Hgb down 4 grams from baseline and 2 grams just from yesterday.  Going to receive 2 units PRBC's.  PLAN: -Patient is currently stable.  Will plan to have her optimized with PRBC's, K+ today.  Will plan for EGD with MAC sedation on 10/25.  Agree with PPI gtt and octreotide gtt empirically until then. -Will cancel swallow evaluation for now.  ZEHR, JESSICA D.  03/16/2015, 10:26 AM  Pager number [phone]  [facility] Attending  I have also seen (this AM) and assessed the patient and agree with the advanced practitioner's assessment and plan.  Gatha Mayer, MD, [facility] [phone] (pager) 03/16/2015 6:01 PM

## 2015-03-16 NOTE — Progress Notes (Signed)
PT Cancellation Note  Patient Details Name: Marissa Brooks MRN: [id number] DOB: 1956/05/10   Cancelled Treatment:    Reason Eval/Treat Not Completed: Medical issues which prohibited therapy (RN recommends to check back this PM. May have test.)   Claretha Cooper 03/16/2015, 10:44 AM [name] PT [phone]

## 2015-03-16 NOTE — Progress Notes (Signed)
MD notified of pt inablilty to void and of bladder scan results of 447ml. Order received for in and out cath.

## 2015-03-16 NOTE — Progress Notes (Signed)
OT Cancellation Note  Patient Details Name: Marissa Brooks MRN: [id number] DOB: May 04, 1956   Cancelled Treatment:    Reason Eval/Treat Not Completed: Medical issues which prohibited therapy   Medical issues which prohibited therapy (RN recommends to check back this PM. May have test) Payton Mccallum D 03/16/2015, 11:55 AM

## 2015-03-16 NOTE — Progress Notes (Signed)
[facility] PROGRESS NOTE  Marissa Brooks ZSW:[id number] DOB: 09-12-1955 DOA: 03/15/2015 PCP: Barbette Merino, MD  Assessment/Plan: #1 GI bleed Patient presented with coffee-ground emesis concern for upper GI bleed. Patient with history of alcohol abuse, hepatitis C and cirrhosis concern for possible esophageal variceal bleed versus peptic ulcer disease given patient's use of Naprosyn. Patient's hemoglobin was 11.1 on 03/03/2015 and on admission had dropped to 9.3 and currently this morning at 7.4. Check an anemia panel. Transfuse 2 units packed red blood cells. Continue octreotide drip. Continue Protonix drip. Continue serial CBCs. IV fluids. Supportive care. Continue bowel rest. GI has been consulted per admitting physician pain ED and consultation pending.   #2 acute blood loss anemia Secondary to problem #1. Check an anemia panel. Patient has been typed and crossed. Hemoglobin was 11.1 on 03/03/2015 and dropped to 9.3 on admission yesterday and currently at 7.4 this morning. Will transfuse 2 units packed red blood cells. GI consultation pending.  #3 hypokalemia/hypocalcemia Magnesium level at 2.5. Replete potassium. Corrected calcium at 8.1. Once patient is tolerating oral intake will place on calcium citrate 500 mg 4 times a day with water and vitamin D 1000 international units daily.  #4 acute renal failure Likely secondary to a prerenal azotemia secondary to GI bleed in the setting of ACE inhibitor and NSAIDs. Will hold NSAIDs and ACE inhibitor. Renal ultrasound pending. Creatinine this morning is 1.73 and improving. Continue hydration with IV fluids. Follow.  #5 nausea vomiting diarrhea No abdominal pain. Patient states this morning that did have bloody diarrhea. Patient with no bowel movements this morning. Patient with no emesis this morning. Antiemetics. Supportive care.  #6 tobacco abuse/alcohol abuse Continue nicotine patch. Continue the Ativan withdrawal protocol.  #7 history  of cocaine abuse Patient has been counseled on cocaine cessation. Social work consult.  #8 depression/anxiety Stable. No suicidal or homicidal ideations. Continue Abilify and Zoloft.  #9 hypertension Stable. ACE inhibitor on hold secondary to acute renal failure. BP borderline. Follow.  #10 history of cirrhosis and HCV infection Per Dr. Baxter Flattery note patient was noted to have undergone treatment of HCV in 1999 at [place] per note by Aurora Mask (06/02/09). Her HCV viral load was [phone] on 12/16/2014. Once patient completes adjuvant chemotherapy per Dr. Magdalen Spatz the plan was to refer her to the hepatology clinic.  #11 history of breast cancer Per Dr. 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 Patient is recommended to receive adjuvant chemotherapy every 3 weeks for 4 cycles patient with fourth cycle of chemotherapy on 03/03/2015 and patient is to be referred for adjuvant irradiation after chemotherapy with adjuvant aromatase inhibitor after she completes radiation. Will inform oncology of patient's admission via Epic. Outpatient follow-up.  #12 dysphasia Speech therapy evaluation pending.  #13 prophylaxis PPI for GI prophylaxis. SCDs for DVT prophylaxis.  Code Status: Full Family Communication: Updated patient no family at bedside. Disposition Plan: Remain in the step down unit   Consultants:  GI pending  Procedures:  None  Antibiotics:  None  HPI/Subjective: Patient sleeping easily arousable. Patient noted to have some bloody emesis overnight none this morning. Patient states had some bright red blood per rectum prior to admission. No bloody bowel movements this morning per nursing. Patient denies any shortness of breath. No chest pain.  Objective: Filed Vitals:   03/16/15 0800  BP: 128/105  Pulse: 96  Temp: 98.6 F (37 C)  Resp: 19    Intake/Output  Summary (Last  24 hours) at 03/16/15 0852 Last data filed at 03/16/15 0700  Gross per 24 hour  Intake 2256.25 ml  Output    550 ml  Net 1706.25 ml   Filed Weights   03/15/15 2230  Weight: 80.3 kg (177 lb 0.5 oz)    Exam:   General:  NAD  Cardiovascular: Tachycardia  Respiratory: Clear to auscultation bilaterally  Abdomen: Soft, nontender, nondistended, positive bowel sounds.  Musculoskeletal: No clubbing cyanosis or edema.  Data Reviewed: Basic Metabolic Panel:  Recent Labs Lab 03/15/15 1910  NA 134*  K 2.4*  CL 96*  CO2 27  GLUCOSE 126*  BUN 67*  CREATININE 1.96*  CALCIUM 7.5*   Liver Function Tests:  Recent Labs Lab 03/15/15 1910  AST 49*  ALT 33  ALKPHOS 44  BILITOT 1.1  PROT 6.8  ALBUMIN 2.8*   No results for input(s): LIPASE, AMYLASE in the last 168 hours.  Recent Labs Lab 03/15/15 2131  AMMONIA 25   CBC:  Recent Labs Lab 03/15/15 1910 03/15/15 2106  WBC 8.7 10.2  NEUTROABS 6.7  --   HGB 9.3* 9.5*  HCT 27.3* 27.5*  MCV 82.5 81.6  PLT 128* 129*   Cardiac Enzymes: No results for input(s): CKTOTAL, CKMB, CKMBINDEX, TROPONINI in the last 168 hours. BNP (last 3 results) No results for input(s): BNP in the last 8760 hours.  ProBNP (last 3 results) No results for input(s): PROBNP in the last 8760 hours.  CBG: No results for input(s): GLUCAP in the last 168 hours.  Recent Results (from the past 240 hour(s))  MRSA PCR Screening     Status: None   Collection Time: 03/15/15  9:46 PM  Result Value Ref Range Status   MRSA by PCR NEGATIVE NEGATIVE Final    Comment:        The GeneXpert MRSA Assay (FDA approved for NASAL specimens only), is one component of a comprehensive MRSA colonization surveillance program. It is not intended to diagnose MRSA infection nor to guide or monitor treatment for MRSA infections.      Studies: No results found.  Scheduled Meds: . ARIPiprazole  5 mg Oral Daily  . folic acid  1 mg Oral Daily  . LORazepam   0-4 mg Intravenous Q6H   Followed by  . [START ON 03/17/2015] LORazepam  0-4 mg Intravenous Q12H  . nicotine  21 mg Transdermal Daily  . [START ON 03/19/2015] pantoprazole (PROTONIX) IV  40 mg Intravenous Q12H  . potassium chloride  40 mEq Oral Once  . potassium chloride  40 mEq Oral Q4H  . sertraline  100 mg Oral QHS  . sodium chloride  3 mL Intravenous Q12H  . thiamine  100 mg Oral Daily   Or  . thiamine  100 mg Intravenous Daily  . traZODone  100 mg Oral Daily   Continuous Infusions: . octreotide  (SANDOSTATIN)    IV infusion 25 mcg/hr (03/16/15 0100)  . pantoprozole (PROTONIX) infusion 8 mg/hr (03/16/15 0752)  . sodium chloride 0.9 % 1,000 mL with potassium chloride 40 mEq infusion 125 mL/hr at 03/16/15 0751    Principal Problem:   GIB (gastrointestinal bleeding) Active Problems:   Acute blood loss anemia   ARF (acute renal failure) (HCC)   Hypokalemia   HCV (hepatitis C virus)   THROMBOCYTOPENIA   Substance abuse   BACK PAIN, LUMBAR   NEOPLASM, MALIGNANT, BREAST, HX OF   Alcohol dependence ([place])   MDD (major depressive disorder) ([place])  Tobacco abuse   Essential hypertension   Depression   Dysphagia   Hypocalcemia    Time spent: 80 minutes    Gwyneth Fernandez M.D. [facility] Pager [phone]. If 7PM-7AM, please contact night-coverage at [email or web address], password [email or web address] 03/16/2015, 8:52 AM  LOS: 1 day

## 2015-03-16 NOTE — Progress Notes (Signed)
PT Cancellation Note  Patient Details Name: Marissa Brooks MRN: [id number] DOB: [birth year]   Cancelled Treatment:    Reason Eval/Treat Not Completed: Patient at procedure or test/unavailable   Claretha Cooper 03/16/2015, 3:42 PM

## 2015-03-16 NOTE — Care Management Note (Signed)
Case Management Note  Patient Details  Name: Marissa Brooks MRN: [id number] Date of Birth: [birth year]  Subjective/Objective:        Rectal bleeding, hgb drop requiring transfusion, hypotensive.            Action/Plan: Date: March 16, 2015 Chart reviewed for concurrent status and case management needs. Will continue to follow patient for changes and needs: Velva Harman, RN, BSN, [place]   [phone]  Expected Discharge Date:                  Expected Discharge Plan:     In-House Referral:     Discharge planning Services     Post Acute Care Choice:    Choice offered to:     DME Arranged:    DME Agency:     HH Arranged:    [place] Agency:     Status of Service:     Medicare Important Message Given:    Date Medicare IM Given:    Medicare IM give by:    Date Additional Medicare IM Given:    Additional Medicare Important Message give by:     If discussed at [place] of Stay Meetings, dates discussed:    Additional Comments:  Leeroy Cha, RN 03/16/2015, 10:11 AM

## 2015-03-16 NOTE — Progress Notes (Signed)
Initial Nutrition Assessment  DOCUMENTATION CODES:   Obesity unspecified  INTERVENTION:   Diet advancement per MD Provide Boost Breeze po TID, each supplement provides 250 kcal and 9 grams of protein while on clear liquid diet.  RD to continue to monitor  NUTRITION DIAGNOSIS:   Inadequate oral intake related to dysphagia as evidenced by per patient/family report.  GOAL:   Patient will meet greater than or equal to 90% of their needs  MONITOR:   Diet advancement, Labs, Weight trends, Skin, I & O's  REASON FOR ASSESSMENT:   Malnutrition Screening Tool    ASSESSMENT:   59 y.o. female with PMH of hypertension, GERD, depression, anxiety, polysubstance abuse including tobacco/alcohol/cocaine abuse, HCV, breast cancer. She presented to [facility] ED on 10/23 with complaints nausea, vomiting, diarrhea, and hematemesis.  Patient not able to provide much history, RN in room. Per RN, pt has been having pain with swallowing. Diet has just been advanced to clear liquids. Pt will be NPO after midnight for EGD tomorrow. RD to order nutritional supplement while patient is on clear liquid diet.  Per weight history, pt has lost 8 lb x 2 months, insignificant for time frame.  Nutrition focused physical exam shows no sign of depletion of muscle mass or body fat.  Labs reviewed: Low K Elevated BUN, Creatinine, Mg  Diet Order:  Diet clear liquid Room service appropriate?: Yes; Fluid consistency:: Thin Diet NPO time specified  Skin:  Reviewed, no issues  Last BM:  10/23  Height:   Ht Readings from Last 1 Encounters:  03/15/15 5\' 2"  (1.575 m)    Weight:   Wt Readings from Last 1 Encounters:  03/15/15 177 lb 0.5 oz (80.3 kg)    Ideal Body Weight:  50 kg  BMI:  Body mass index is 32.37 kg/(m^2).  Estimated Nutritional Needs:   Kcal:  1700-1900  Protein:  75-85g  Fluid:  2L/day  EDUCATION NEEDS:   No education needs identified at this time  Clayton Bibles, MS, RD,  LDN Pager: [phone] After Hours Pager: [phone]

## 2015-03-17 ENCOUNTER — Inpatient Hospital Stay (HOSPITAL_COMMUNITY): Payer: Medicare HMO

## 2015-03-17 ENCOUNTER — Inpatient Hospital Stay (HOSPITAL_COMMUNITY): Payer: Medicare HMO | Admitting: Anesthesiology

## 2015-03-17 ENCOUNTER — Encounter (HOSPITAL_COMMUNITY): Payer: Self-pay | Admitting: Certified Registered Nurse Anesthetist

## 2015-03-17 ENCOUNTER — Ambulatory Visit: Payer: Medicare HMO | Admitting: Radiation Oncology

## 2015-03-17 ENCOUNTER — Encounter (HOSPITAL_COMMUNITY)
Admission: EM | Disposition: A | Discharge status: Home Health | Payer: Self-pay | Source: Home / Self Care | Attending: Internal Medicine

## 2015-03-17 ENCOUNTER — Ambulatory Visit: Payer: Self-pay

## 2015-03-17 DIAGNOSIS — K209 Esophagitis, unspecified without bleeding: Secondary | ICD-10-CM | POA: Diagnosis present

## 2015-03-17 DIAGNOSIS — J189 Pneumonia, unspecified organism: Secondary | ICD-10-CM

## 2015-03-17 DIAGNOSIS — D72829 Elevated white blood cell count, unspecified: Secondary | ICD-10-CM

## 2015-03-17 DIAGNOSIS — K920 Hematemesis: Secondary | ICD-10-CM | POA: Insufficient documentation

## 2015-03-17 HISTORY — DX: Pneumonia, unspecified organism: J18.9

## 2015-03-17 HISTORY — PX: ESOPHAGOGASTRODUODENOSCOPY (EGD) WITH PROPOFOL: SHX5813

## 2015-03-17 LAB — TYPE AND SCREEN
ABO/RH(D): A POS
ANTIBODY SCREEN: NEGATIVE
UNIT DIVISION: 0
Unit division: 0

## 2015-03-17 LAB — URINALYSIS, ROUTINE W REFLEX MICROSCOPIC
Bilirubin Urine: NEGATIVE
Glucose, UA: NEGATIVE mg/dL
Ketones, ur: NEGATIVE mg/dL
NITRITE: NEGATIVE
PROTEIN: NEGATIVE mg/dL
SPECIFIC GRAVITY, URINE: 1.016 (ref 1.005–1.030)
UROBILINOGEN UA: 0.2 mg/dL (ref 0.0–1.0)
pH: 5.5 (ref 5.0–8.0)

## 2015-03-17 LAB — CBC
HCT: 31.2 % — ABNORMAL LOW (ref 36.0–46.0)
HCT: 31 % — ABNORMAL LOW (ref 36.0–46.0)
HEMATOCRIT: 31.2 % — AB (ref 36.0–46.0)
HEMOGLOBIN: 10.5 g/dL — AB (ref 12.0–15.0)
Hemoglobin: 10.6 g/dL — ABNORMAL LOW (ref 12.0–15.0)
Hemoglobin: 10.6 g/dL — ABNORMAL LOW (ref 12.0–15.0)
MCH: 28.7 pg (ref 26.0–34.0)
MCH: 29.3 pg (ref 26.0–34.0)
MCH: 28.6 pg (ref 26.0–34.0)
MCHC: 34 g/dL (ref 30.0–36.0)
MCHC: 34.2 g/dL (ref 30.0–36.0)
MCHC: 33.7 g/dL (ref 30.0–36.0)
MCV: 84.6 fL (ref 78.0–100.0)
MCV: 85.6 fL (ref 78.0–100.0)
MCV: 85 fL (ref 78.0–100.0)
PLATELETS: 116 10*3/uL — AB (ref 150–400)
PLATELETS: 113 10*3/uL — AB (ref 150–400)
Platelets: 132 10*3/uL — ABNORMAL LOW (ref 150–400)
RBC: 3.69 MIL/uL — AB (ref 3.87–5.11)
RBC: 3.62 MIL/uL — AB (ref 3.87–5.11)
RBC: 3.67 MIL/uL — AB (ref 3.87–5.11)
RDW: 17.8 % — AB (ref 11.5–15.5)
RDW: 18 % — AB (ref 11.5–15.5)
RDW: 18.2 % — ABNORMAL HIGH (ref 11.5–15.5)
WBC: 15.7 10*3/uL — AB (ref 4.0–10.5)
WBC: 16.5 10*3/uL — AB (ref 4.0–10.5)
WBC: 16.4 10*3/uL — AB (ref 4.0–10.5)

## 2015-03-17 LAB — BASIC METABOLIC PANEL
ANION GAP: 5 (ref 5–15)
BUN: 32 mg/dL — AB (ref 6–20)
CALCIUM: 7.6 mg/dL — AB (ref 8.9–10.3)
CO2: 22 mmol/L (ref 22–32)
Chloride: 117 mmol/L — ABNORMAL HIGH (ref 101–111)
Creatinine, Ser: 1.36 mg/dL — ABNORMAL HIGH (ref 0.44–1.00)
GFR calc Af Amer: 49 mL/min — ABNORMAL LOW (ref >60)
GFR, EST NON AFRICAN AMERICAN: 42 mL/min — AB (ref >60)
GLUCOSE: 111 mg/dL — AB (ref 65–99)
POTASSIUM: 4.5 mmol/L (ref 3.5–5.1)
SODIUM: 144 mmol/L (ref 135–145)

## 2015-03-17 LAB — GLUCOSE, CAPILLARY: GLUCOSE-CAPILLARY: 102 mg/dL — AB (ref 65–99)

## 2015-03-17 LAB — EXPECTORATED SPUTUM ASSESSMENT W REFEX TO RESP CULTURE

## 2015-03-17 LAB — EXPECTORATED SPUTUM ASSESSMENT W GRAM STAIN, RFLX TO RESP C

## 2015-03-17 LAB — URINE MICROSCOPIC-ADD ON

## 2015-03-17 LAB — AMMONIA: AMMONIA: 37 umol/L — AB (ref 9–35)

## 2015-03-17 LAB — MAGNESIUM: MAGNESIUM: 2.5 mg/dL — AB (ref 1.7–2.4)

## 2015-03-17 LAB — STREP PNEUMONIAE URINARY ANTIGEN: STREP PNEUMO URINARY ANTIGEN: NEGATIVE

## 2015-03-17 SURGERY — ESOPHAGOGASTRODUODENOSCOPY (EGD) WITH PROPOFOL
Anesthesia: Monitor Anesthesia Care

## 2015-03-17 MED ORDER — HYDRALAZINE HCL 20 MG/ML IJ SOLN: 10.0000 mg | Freq: Four times a day (QID) | INTRAMUSCULAR | Status: DC | PRN

## 2015-03-17 MED ORDER — PROPOFOL 500 MG/50ML IV EMUL
INTRAVENOUS | Status: DC | PRN
  Administered 2015-03-17: 300 ug/kg/min via INTRAVENOUS

## 2015-03-17 MED ORDER — ONDANSETRON HCL 4 MG/2ML IJ SOLN
INTRAMUSCULAR | Status: AC
Start: 2015-03-17 — End: 2015-03-17
  Filled 2015-03-17: qty 2

## 2015-03-17 MED ORDER — VANCOMYCIN HCL IN DEXTROSE 750-5 MG/150ML-% IV SOLN
750.0000 mg | Freq: Two times a day (BID) | INTRAVENOUS | Status: DC
  Administered 2015-03-17 – 2015-03-20 (×6): 750 mg via INTRAVENOUS
  Filled 2015-03-17 (×8): qty 150

## 2015-03-17 MED ORDER — LACTATED RINGERS IV SOLN
INTRAVENOUS | Status: DC | PRN
  Administered 2015-03-17: 11:00:00 via INTRAVENOUS

## 2015-03-17 MED ORDER — ENALAPRILAT 1.25 MG/ML IV SOLN
0.6250 mg | Freq: Four times a day (QID) | INTRAVENOUS | Status: DC
  Administered 2015-03-17: 0.625 mg via INTRAVENOUS
  Filled 2015-03-17 (×5): qty 0.5

## 2015-03-17 MED ORDER — PROPOFOL 10 MG/ML IV BOLUS
INTRAVENOUS | Status: AC
  Filled 2015-03-17: qty 20

## 2015-03-17 MED ORDER — DEXTROSE-NACL 5-0.45 % IV SOLN
INTRAVENOUS | Status: DC
  Administered 2015-03-17 – 2015-03-18 (×2): via INTRAVENOUS
  Filled 2015-03-17 (×4): qty 1000

## 2015-03-17 MED ORDER — ONDANSETRON HCL 4 MG/2ML IJ SOLN
INTRAMUSCULAR | Status: DC | PRN
  Administered 2015-03-17: 4 mg via INTRAVENOUS

## 2015-03-17 MED ORDER — IOHEXOL 300 MG/ML  SOLN
150.0000 mL | Freq: Once | INTRAMUSCULAR | Status: DC | PRN
  Administered 2015-03-17: 15 mL via ORAL
  Filled 2015-03-17: qty 150

## 2015-03-17 MED ORDER — ACETAMINOPHEN 325 MG PO TABS
650.0000 mg | ORAL_TABLET | ORAL | Status: DC | PRN
  Administered 2015-03-17 – 2015-03-19 (×2): 650 mg via ORAL
  Filled 2015-03-17 (×2): qty 2

## 2015-03-17 MED ORDER — DEXTROSE 5 % IV SOLN: 2.0000 g | Freq: Three times a day (TID) | INTRAVENOUS | Status: DC

## 2015-03-17 MED ORDER — DEXTROSE 5 % IV SOLN
2.0000 g | Freq: Two times a day (BID) | INTRAVENOUS | Status: DC
  Administered 2015-03-17 – 2015-03-19 (×4): 2 g via INTRAVENOUS
  Filled 2015-03-17 (×5): qty 2

## 2015-03-17 MED ORDER — METOPROLOL TARTRATE 1 MG/ML IV SOLN
5.0000 mg | Freq: Three times a day (TID) | INTRAVENOUS | Status: DC
  Administered 2015-03-17 – 2015-03-22 (×15): 5 mg via INTRAVENOUS
  Filled 2015-03-17 (×15): qty 5

## 2015-03-17 SURGICAL SUPPLY — 14 items

## 2015-03-17 NOTE — Transfer of Care (Signed)
Immediate Anesthesia Transfer of Care Note  Patient: Marissa Brooks  Procedure(s) Performed: Procedure(s): ESOPHAGOGASTRODUODENOSCOPY (EGD) WITH PROPOFOL (N/A)  Patient Location: PACU  Anesthesia Type:MAC  Level of Consciousness:  sedated, patient cooperative and responds to stimulation  Airway & Oxygen Therapy:Patient Spontanous Breathing and Patient connected to face mask oxgen  Post-op Assessment:  Report given to PACU RN and Post -op Vital signs reviewed and stable  Post vital signs:  Reviewed and stable  Last Vitals:  Filed Vitals:   03/17/15 1032  BP: 157/67  Pulse: 73  Temp: 37.2 C  Resp: 22    Complications: No apparent anesthesia complications

## 2015-03-17 NOTE — Progress Notes (Signed)
[facility] PROGRESS NOTE  Marissa Brooks XKG:[id number] DOB: Jan 07, 1956 DOA: 03/15/2015 PCP: Barbette Merino, MD  Assessment/Plan: #1 GI bleed Patient presented with coffee-ground emesis concern for upper GI bleed. Patient with history of alcohol abuse, hepatitis C and cirrhosis concern for possible esophageal variceal bleed versus peptic ulcer disease given patient's use of Naprosyn. Patient's hemoglobin was 11.1 on 03/03/2015 and on admission had dropped to 9.3 and at 7.4 on 03/16/2015. Patient with bloody bowel movements approximately 6 maroon-colored stools yesterday per nursing. Anemia panel consistent with anemia of chronic disease. Patient status post 2 units packed red blood cells hemoglobin currently at 10.6. Patient . C has been assessed by gastroenterology and patient for upper endoscopy for further evaluation today. Continue octreotide drip. Continue Protonix drip. Continue serial CBCs. IV fluids. Supportive care. Continue bowel rest.  #2 acute blood loss anemia Secondary to problem #1. Anemia panel consistent with anemia of chronic disease. Ferritin elevated at 2098. Folate at 9.2. Patient status post 2 units packed red blood cells hemoglobin currently at 10.6 from 7.4 on 03/16/2015. Hemoglobin was 11.1 on 03/03/2015. Patient has been seen in consultation by gastroenterology and patient for upper endoscopy today.   #3 hypokalemia/hypocalcemia Magnesium level at 2.5. Repleted potassium. Corrected calcium at 8.8. Once patient is tolerating oral intake will place on calcium citrate 500 mg 4 times a day with water and vitamin D 1000 international units daily if corrected calcium has not normalized.  #4 acute renal failure Likely secondary to a prerenal azotemia secondary to GI bleed in the setting of ACE inhibitor and NSAIDs. Will hold NSAIDs and ACE inhibitor. Renal ultrasound with no acute abnormality. Renal function improving and currently at 1.36. DC IV enalapril. Continue hydration  with IV fluids. Follow.  #5 nausea/ vomiting/ diarrhea No abdominal pain. Patient with bloody bowel movements overnight. No further nausea or vomiting. Antiemetics. Supportive care.  #6 tobacco abuse/alcohol abuse Continue nicotine patch. Continue the Ativan withdrawal protocol.  #7 history of cocaine abuse Patient has been counseled on cocaine cessation. Social work consult.  #8 depression/anxiety/bipolar disorder  Stable. No suicidal or homicidal ideations. Continue Abilify and Zoloft.  #9 hypertension Blood pressure elevated. Place on IV Lopressor. DC IV enalapril.  #10 history of cirrhosis and HCV infection Per Dr. Baxter Flattery note patient was noted to have undergone treatment of HCV in 1999 at [facility] per note by Aurora Mask (06/02/09). Her HCV viral load was [phone] on 12/16/2014. Once patient completes adjuvant chemotherapy per Dr. Burr Medico, the plan was to refer her to the hepatology clinic.  #11 history of breast cancer Per Dr. 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 Patient is recommended to receive adjuvant chemotherapy every 3 weeks for 4 cycles patient with fourth cycle of chemotherapy on 03/03/2015 and patient is to be referred for adjuvant irradiation after chemotherapy with adjuvant aromatase inhibitor after she completes radiation. Will inform oncology of patient's admission via Epic. Outpatient follow-up.  #12 dysphasia Dysphasia noted to be acute. Patient is immunocompromised. Speech evaluation has been canceled by gastroenterology. Patient for upper endoscopy today.   #13 leukocytosis Check a chest x-ray, repeat UA with cultures and sensitivities, check blood cultures 2.  #14 thrombocytopenia Likely chemotherapy-induced. Improving since admission. Follow.  #15 prophylaxis PPI for GI prophylaxis. SCDs for DVT prophylaxis.  Code Status: Full Family Communication:  Updated patient no family at bedside. Disposition Plan: Remain in the step down unit   Consultants:  GI:  Dr. Carlean Purl 03/16/2015  Procedures:  Renal ultrasound 03/16/2015  PICC line 03/16/2015  2 units packed red blood cells 03/16/2015  Antibiotics:  None  HPI/Subjective: Patient alert and following commands. Patient stated had bloody bowel movements last night. Per nursing patient with a total of 6 maroon colored stools over the course of yesterday. Patient denies any shortness of breath. No chest pain. No abdominal pain. No nausea. No emesis.  Objective: Filed Vitals:   03/17/15 0600  BP: 179/69  Pulse: 92  Temp: 99.1 F (37.3 C)  Resp: 20    Intake/Output Summary (Last 24 hours) at 03/17/15 0846 Last data filed at 03/17/15 0800  Gross per 24 hour  Intake 3536.65 ml  Output   1430 ml  Net 2106.65 ml   Filed Weights   03/15/15 2230  Weight: 80.3 kg (177 lb 0.5 oz)    Exam:   General:  NAD. Flat affect  Cardiovascular: RRR  Respiratory: Clear to auscultation bilaterally anterior lung fields  Abdomen: Soft, nontender, nondistended, positive bowel sounds.  Musculoskeletal: No clubbing cyanosis or edema.  Data Reviewed: Basic Metabolic Panel:  Recent Labs Lab 03/15/15 1910 03/16/15 0320 03/17/15 0642  NA 134* 138 144  K 2.4* 2.9* 4.5  CL 96* 105 117*  CO2 27 22 22   GLUCOSE 126* 123* 111*  BUN 67* 61* 32*  CREATININE 1.96* 1.73* 1.36*  CALCIUM 7.5* 6.9* 7.6*  MG  --  2.5* 2.5*   Liver Function Tests:  Recent Labs Lab 03/15/15 1910 03/16/15 0320  AST 49* 73*  ALT 33 47  ALKPHOS 44 41  BILITOT 1.1 1.2  PROT 6.8 5.8*  ALBUMIN 2.8* 2.5*   No results for input(s): LIPASE, AMYLASE in the last 168 hours.  Recent Labs Lab 03/15/15 2131  AMMONIA 25   CBC:  Recent Labs Lab 03/15/15 1910 03/15/15 2106 03/16/15 0320 03/16/15 0900 03/16/15 1900 03/17/15 0641  WBC 8.7 10.2 7.3 8.3 12.5* 15.7*  NEUTROABS 6.7  --   --   --   --    --   HGB 9.3* 9.5* 7.4* 7.6* 9.0* 10.6*  HCT 27.3* 27.5* 22.0* 22.1* 26.3* 31.2*  MCV 82.5 81.6 82.4 83.7 84.3 84.6  PLT 128* 129* 101* 114* 114* 116*   Cardiac Enzymes: No results for input(s): CKTOTAL, CKMB, CKMBINDEX, TROPONINI in the last 168 hours. BNP (last 3 results) No results for input(s): BNP in the last 8760 hours.  ProBNP (last 3 results) No results for input(s): PROBNP in the last 8760 hours.  CBG:  Recent Labs Lab 03/16/15 0821 03/17/15 0758  GLUCAP 111* 102*    Recent Results (from the past 240 hour(s))  MRSA PCR Screening     Status: None   Collection Time: 03/15/15  9:46 PM  Result Value Ref Range Status   MRSA by PCR NEGATIVE NEGATIVE Final    Comment:        The GeneXpert MRSA Assay (FDA approved for NASAL specimens only), is one component of a comprehensive MRSA colonization surveillance program. It is not intended to diagnose MRSA infection nor to guide or monitor treatment for MRSA infections.   C difficile quick scan w PCR reflex     Status: None   Collection Time: 03/16/15  1:13 PM  Result Value Ref Range Status   C Diff antigen NEGATIVE NEGATIVE Final   C Diff toxin NEGATIVE NEGATIVE Final   C Diff interpretation Negative for toxigenic C. difficile  Final     Studies: US Renal  03/16/2015  CLINICAL DATA:  Acute kidney injury. EXAM: RENAL / URINARY TRACT ULTRASOUND COMPLETE COMPARISON:  None. FINDINGS: Right Kidney: Length: 11.3 cm. Echogenicity within normal limits. No mass or hydronephrosis visualized. Left Kidney: Length: 12.8 cm. Echogenicity within normal limits. No mass or hydronephrosis visualized. Bladder: Appears normal for degree of bladder distention. IMPRESSION: Negative exam. Electronically Signed   By: Inge Rise M.D.   On: 03/16/2015 09:37    Scheduled Meds: . ARIPiprazole  5 mg Oral Daily  . enalaprilat  0.625 mg Intravenous 4 times per day  . feeding supplement  1 Container Oral TID BM  . folic acid  1 mg Oral  Daily  . LORazepam  0-4 mg Intravenous Q6H   Followed by  . LORazepam  0-4 mg Intravenous Q12H  . nicotine  21 mg Transdermal Daily  . [START ON 03/19/2015] pantoprazole (PROTONIX) IV  40 mg Intravenous Q12H  . potassium chloride  40 mEq Oral Once  . sertraline  100 mg Oral QHS  . sodium chloride  10-40 mL Intracatheter Q12H  . sodium chloride  3 mL Intravenous Q12H  . thiamine  100 mg Oral Daily   Or  . thiamine  100 mg Intravenous Daily  . traZODone  100 mg Oral Daily   Continuous Infusions: . dextrose 5 % and 0.45% NaCl 1,000 mL infusion 100 mL/hr at 03/17/15 0752  . octreotide  (SANDOSTATIN)    IV infusion 50 mcg/hr (03/17/15 0602)  . pantoprozole (PROTONIX) infusion 8 mg/hr (03/17/15 0602)    Principal Problem:   GIB (gastrointestinal bleeding) Active Problems:   Acute blood loss anemia   ARF (acute renal failure) (HCC)   Hypokalemia   HCV (hepatitis C virus)   THROMBOCYTOPENIA   BIPOLAR AFFECTIVE DISORDER   Substance abuse   BACK PAIN, LUMBAR   NEOPLASM, MALIGNANT, BREAST, HX OF   Alcohol dependence ([place])   MDD (major depressive disorder) (HCC)   Breast cancer of upper-outer quadrant of right female breast s/p Right MRM 10/30/14   Tobacco abuse   Hypertension   Anxiety   Essential hypertension   Depression   Dysphagia   Hypocalcemia   Leukocytosis    Time spent: 33 minutes    THOMPSON,DANIEL M.D. [facility] Pager [phone]. If 7PM-7AM, please contact night-coverage at [email or web address], password [facility] 03/17/2015, 8:46 AM  LOS: 2 days

## 2015-03-17 NOTE — Anesthesia Preprocedure Evaluation (Signed)
Anesthesia Evaluation  Patient identified by MRN, date of birth, ID band Patient awake    Reviewed: Allergy & Precautions, NPO status , Patient's Chart, lab work & pertinent test results  Airway Mallampati: II  TM Distance: >3 FB Neck ROM: Full    Dental no notable dental hx.    Pulmonary shortness of breath and with exertion, pneumonia, resolved, Current Smoker,    Pulmonary exam normal breath sounds clear to auscultation       Cardiovascular hypertension, Pt. on medications Normal cardiovascular exam Rhythm:Regular Rate:Normal     Neuro/Psych PSYCHIATRIC DISORDERS Anxiety Depression Bipolar Disorder negative neurological ROS     GI/Hepatic GERD  Medicated,(+)     substance abuse  alcohol use, Hepatitis -  Endo/Other  negative endocrine ROS  Renal/GU Renal disease  negative genitourinary   Musculoskeletal  (+) Arthritis ,   Abdominal   Peds negative pediatric ROS (+)  Hematology  (+) anemia ,   Anesthesia Other Findings   Reproductive/Obstetrics negative OB ROS                             Anesthesia Physical Anesthesia Plan  ASA: III  Anesthesia Plan: MAC   Post-op Pain Management:    Induction: Intravenous  Airway Management Planned: Natural Airway  Additional Equipment:   Intra-op Plan:   Post-operative Plan:   Informed Consent: I have reviewed the patients History and Physical, chart, labs and discussed the procedure including the risks, benefits and alternatives for the proposed anesthesia with the patient or authorized representative who has indicated his/her understanding and acceptance.   Dental advisory given  Plan Discussed with: CRNA  Anesthesia Plan Comments:         Anesthesia Quick Evaluation

## 2015-03-17 NOTE — Anesthesia Postprocedure Evaluation (Signed)
  Anesthesia Post-op Note  Patient: Marissa Brooks  Procedure(s) Performed: Procedure(s) (LRB): ESOPHAGOGASTRODUODENOSCOPY (EGD) WITH PROPOFOL (N/A)  Patient Location: PACU  Anesthesia Type: MAC  Level of Consciousness: awake and alert   Airway and Oxygen Therapy: Patient Spontanous Breathing  Post-op Pain: mild  Post-op Assessment: Post-op Vital signs reviewed, Patient's Cardiovascular Status Stable, Respiratory Function Stable, Patent Airway and No signs of Nausea or vomiting  Last Vitals:  Filed Vitals:   03/17/15 1225  BP: 157/69  Pulse: 73  Temp:   Resp: 21    Post-op Vital Signs: stable   Complications: No apparent anesthesia complications

## 2015-03-17 NOTE — Evaluation (Signed)
Physical Therapy Evaluation Patient Details Name: Marissa Brooks MRN: [id number] DOB: 1955-06-20 Today's Date: 03/17/2015   History of Present Illness  Pt is a 59 y.o. female with PMH of hypertension, GERD, depression, anxiety, polysubstance abuse including tobacco, alcohol and cocaine abuse, HCV, breast cancer, who presents with nausea, vomiting, diarrhea and hematemesis.  Clinical Impression  Patient quiet and somewhat vague about  Caregiver support at home. Patient will benefit from PT to address problems listed in the note below  To return to independent level.    Follow Up Recommendations Home health PT;Supervision/Assistance - 24 hour    Equipment Recommendations  Rolling walker with 5" wheels    Recommendations for Other Services       Precautions / Restrictions Precautions Precautions: Fall Restrictions Weight Bearing Restrictions: No      Mobility  Bed Mobility Overal bed mobility: Needs Assistance Bed Mobility: Supine to Sit     Supine to sit: Min assist     General bed mobility comments: extra time to move to the bed edge  Transfers Overall transfer level: Needs assistance Equipment used: Rolling walker (2 wheeled) Transfers: Sit to/from [facility] Sit to Stand: Min assist;+2 safety/equipment Stand pivot transfers: +2 safety/equipment;Mod assist       General transfer comment: steady assist to rise at [facility], stood at [facility] for pericare. wide base. small steps to turn to the recliner, side base. knees jerking during standing.  Ambulation/Gait                Stairs            Wheelchair Mobility    Modified Rankin (Stroke Patients Only)       Balance Overall balance assessment: Needs assistance Sitting-balance support: Feet supported;No upper extremity supported Sitting balance-Leahy Scale: Fair     Standing balance support: During functional activity;Bilateral upper extremity supported Standing balance-Leahy Scale:  Poor                               Pertinent Vitals/Pain Pain Assessment: No/denies pain    Home Living Family/patient expects to be discharged to:: Private residence Living Arrangements: Non-relatives/Friends   Type of Home: Apartment Home Access: Level entry     Home Layout: One level Home Equipment: Environmental consultant - 2 wheels Additional Comments: patient  reported she did and did not have RW, uncertain at this time    Prior Function Level of Independence: Independent               Hand Dominance        Extremity/Trunk Assessment   Upper Extremity Assessment: Defer to OT evaluation           Lower Extremity Assessment: Generalized weakness      Cervical / Trunk Assessment: Normal  Communication   Communication:  (soft voice,)  Cognition Arousal/Alertness: Awake/alert Behavior During Therapy: Flat affect Overall Cognitive Status: Within Functional Limits for tasks assessed Area of Impairment: Orientation       Following Commands: Follows one step commands consistently       General Comments: somewaht slow to respond, extra time to follow     General Comments      Exercises        Assessment/Plan    PT Assessment Patient needs continued PT services  PT Diagnosis Difficulty walking;Generalized weakness   PT Problem List Decreased strength;Decreased activity tolerance;Decreased mobility;Decreased balance;Decreased knowledge of use of DME  PT Treatment  Interventions DME instruction;Gait training;Functional mobility training;Therapeutic activities;Therapeutic exercise;Balance training;Patient/family education   PT Goals (Current goals can be found in the Care Plan section) Acute Rehab PT Goals Patient Stated Goal: agreed to OOB PT Goal Formulation: With patient Time For Goal Achievement: 03/31/15 Potential to Achieve Goals: Good    Frequency Min 3X/week   Barriers to discharge Decreased caregiver support      Co-evaluation  PT/OT/SLP Co-Evaluation/Treatment: Yes Reason for Co-Treatment: For patient/therapist safety PT goals addressed during session: Mobility/safety with mobility OT goals addressed during session: ADL's and self-care;Proper use of Adaptive equipment and DME       End of Session   Activity Tolerance: Patient tolerated treatment well Patient left: in chair;with call bell/phone within reach;with chair alarm set Nurse Communication: Mobility status         Time: 0938-1000 PT Time Calculation (min) (ACUTE ONLY): 22 min   Charges:   PT Evaluation $Initial PT Evaluation Tier I: 1 Procedure     PT G CodesClaretha Cooper 03/17/2015, 12:16 PM [name] PT [phone]

## 2015-03-17 NOTE — Progress Notes (Signed)
ANTIBIOTIC CONSULT NOTE - INITIAL  Pharmacy Consult for vancomycin, ceftazidime Indication: pneumonia  Allergies  Allergen Reactions  . Centrum Hives    Patient Measurements: Height: 5\' 2"  (157.5 cm) Weight: 177 lb 0.5 oz (80.3 kg) IBW/kg (Calculated) : 50.1  Vital Signs: Temp: 98.2 F (36.8 C) (10/25 1125) Temp Source: Oral (10/25 1125) BP: 157/69 mmHg (10/25 1225) Pulse Rate: 73 (10/25 1225) Intake/Output from previous day: 10/24 0701 - 10/25 0700 In: 3652.1 [I.V.:2982.1; Blood:670] Out: 1300 [Urine:1300] Intake/Output from this shift: Total I/O In: 150 [I.V.:150] Out: 130 [Urine:130]  Labs:  Recent Labs  03/15/15 1910 03/15/15 2100  03/16/15 0320  03/16/15 1900 03/17/15 0641 03/17/15 0642 03/17/15 0845  WBC 8.7  --   < > 7.3  < > 12.5* 15.7*  --  16.5*  HGB 9.3*  --   < > 7.4*  < > 9.0* 10.6*  --  10.6*  PLT 128*  --   < > 101*  < > 114* 116*  --  113*  LABCREA  --  154.23  --   --   --   --   --   --   --   CREATININE 1.96*  --   --  1.73*  --   --   --  1.36*  --   < > = values in this interval not displayed. Estimated Creatinine Clearance: 44.8 mL/min (by C-G formula based on Cr of 1.36). No results for input(s): VANCOTROUGH, VANCOPEAK, VANCORANDOM, GENTTROUGH, GENTPEAK, GENTRANDOM, TOBRATROUGH, TOBRAPEAK, TOBRARND, AMIKACINPEAK, AMIKACINTROU, AMIKACIN in the last 72 hours.   Microbiology: Recent Results (from the past 720 hour(s))  Urine culture     Status: None   Collection Time: 02/21/15  9:32 PM  Result Value Ref Range Status   Specimen Description URINE, CLEAN CATCH  Final   Special Requests Normal  Final   Culture   Final    MULTIPLE SPECIES PRESENT, SUGGEST RECOLLECTION Performed at [facility]    Report Status 02/24/2015 FINAL  Final  MRSA PCR Screening     Status: None   Collection Time: 03/15/15  9:46 PM  Result Value Ref Range Status   MRSA by PCR NEGATIVE NEGATIVE Final    Comment:        The GeneXpert MRSA Assay  (FDA approved for NASAL specimens only), is one component of a comprehensive MRSA colonization surveillance program. It is not intended to diagnose MRSA infection nor to guide or monitor treatment for MRSA infections.   C difficile quick scan w PCR reflex     Status: None   Collection Time: 03/16/15  1:13 PM  Result Value Ref Range Status   C Diff antigen NEGATIVE NEGATIVE Final   C Diff toxin NEGATIVE NEGATIVE Final   C Diff interpretation Negative for toxigenic C. difficile  Final    Medical History: Past Medical History  Diagnosis Date  . Hypertension   . Shortness of breath dyspnea     with walking  . Pneumonia   . Anxiety   . Depression   . Bipolar disorder ([place])   . GERD (gastroesophageal reflux disease)   . Arthritis   . Cancer [facility]) 2002    left breast cancer tx in [place], right breast cancer 08/2014  . ETOH abuse     as of 10/21/14 none for a week  . Cocaine abuse     last use 10/13/14  . Breast cancer ([place]) 09/10/14    right breast,hx left breast ca,2002 [place]  .  Allergy   . Tobacco abuse   . HCV (hepatitis C virus)     Medications:  Scheduled:  . ARIPiprazole  5 mg Oral Daily  . cefTAZidime (FORTAZ)  IV  2 g Intravenous 3 times per day  . feeding supplement  1 Container Oral TID BM  . folic acid  1 mg Oral Daily  . LORazepam  0-4 mg Intravenous Q6H   Followed by  . LORazepam  0-4 mg Intravenous Q12H  . metoprolol  5 mg Intravenous 3 times per day  . nicotine  21 mg Transdermal Daily  . [START ON 03/19/2015] pantoprazole (PROTONIX) IV  40 mg Intravenous Q12H  . potassium chloride  40 mEq Oral Once  . sertraline  100 mg Oral QHS  . sodium chloride  10-40 mL Intracatheter Q12H  . sodium chloride  3 mL Intravenous Q12H  . thiamine  100 mg Oral Daily   Or  . thiamine  100 mg Intravenous Daily  . traZODone  100 mg Oral Daily   Infusions:  . dextrose 5 % and 0.45% NaCl 1,000 mL infusion 100 mL/hr at 03/17/15 0752  . octreotide   (SANDOSTATIN)    IV infusion 50 mcg/hr (03/17/15 0700)  . pantoprozole (PROTONIX) infusion 8 mg/hr (03/17/15 0700)   Assessment: 59 yo presented to ER on 10/23 with N/V/D and GIB now s/p endo procedure for evaluation of diffuse severe ulcerative esophagitis that was biopsied. Patient completed TC chemotherapy for breast cancer on 10/18 per Dr. Burr Medico and [place] also includes hx HCV/hx cirrhosis with continued alcohol abuse . Post op today, to start vancomycin and ceftazidime for HAP development. At baseline, WBC elevated, SCr up but improving with est CrCl of 45 ml/min and afebrile  Goal of Therapy:  Vancomycin trough level 15-20 mcg/ml  Plan:  1) Ceftazidime 2g IV q12 for CrCl 30-50 ml/min 2) Vancomycin 750mg  IV q12 per current weight and renal function   Adrian Saran, PharmD, BCPS Pager [phone] 03/17/2015 2:16 PM

## 2015-03-17 NOTE — Progress Notes (Signed)
Date: March 17, 2015 Chart reviewed for concurrent status and case management needs. Will continue to follow patient for changes and needs: Rhonda Davis, RN, BSN, CCM   [phone] 

## 2015-03-17 NOTE — Evaluation (Addendum)
Occupational Therapy Evaluation Patient Details Name: Marissa Brooks MRN: [id number] DOB: 09-Mar-1956 Today's Date: 03/17/2015    History of Present Illness Pt is a 59 y.o. female with PMH of hypertension, GERD, depression, anxiety, polysubstance abuse including tobacco, alcohol and cocaine abuse, HCV, breast cancer, who presents with nausea, vomiting, diarrhea and hematemesis.   Clinical Impression   Pt up to recliner with +2 assist today. She is shaky in standing and unable to let go of the walker currently to be able to wash or use UEs in functional ADL. She states she has a friend that can help her at d/c and will be with her all the time but pt doesn't elaborate on information. Will follow on acute and as long as pt progresses and has 24/7, then recommend home with [place].    Follow Up Recommendations  Home health OT;Supervision/Assistance - 24 hour    Equipment Recommendations  3 in 1 bedside comode    Recommendations for Other Services       Precautions / Restrictions Precautions Precautions: Fall Restrictions Weight Bearing Restrictions: No      Mobility Bed Mobility Overal bed mobility: Needs Assistance Bed Mobility: Supine to Sit     Supine to sit: Min assist     General bed mobility comments: extra time to move to the bed edge  Transfers Overall transfer level: Needs assistance Equipment used: Rolling walker (2 wheeled) Transfers: Sit to/from [facility] Sit to Stand: Min assist;+2 safety/equipment Stand pivot transfers: +2 safety/equipment;Mod assist       General transfer comment: steady assist to rise at [facility], stood at [facility] for pericare. wide base. small steps to turn to the recliner, side base. knees jerking during standing.    Balance Overall balance assessment: Needs assistance Sitting-balance support: Feet supported;No upper extremity supported Sitting balance-Leahy Scale: Fair     Standing balance support: During functional  activity;Bilateral upper extremity supported Standing balance-Leahy Scale: Poor                              ADL Overall ADL's : Needs assistance/impaired Eating/Feeding: NPO   Grooming: Wash/dry hands;Set up;Sitting   Upper Body Bathing: Set up;Supervision/ safety;Sitting   Lower Body Bathing: +2 for safety/equipment;Maximal assistance;Sit to/from stand   Upper Body Dressing : Minimal assistance;Sitting   Lower Body Dressing: +2 for safety/equipment;Total assistance;Sit to/from stand   Toilet Transfer: +2 for safety/equipment;Moderate assistance;Stand-pivot;RW   Toileting- Clothing Manipulation and Hygiene: +2 for safety/equipment;Total assistance         General ADL Comments: Pt with noted BM in the bed upon sitting EOB. Assisted pt to stand and currently pt is unable to let go of the walker to use UEs in functional tasks as she requires holding to walker for stability. Pivoted to chair with wakler and +2 for safety. Pt states she has a friend that will be with her at d/c and states she wont be alone but doesnt elaborate on information.      Vision     Perception     Praxis      Pertinent Vitals/Pain Pain Assessment: No/denies pain     Hand Dominance     Extremity/Trunk Assessment Upper Extremity Assessment Upper Extremity Assessment: generalized weakness.      Cervical / Trunk Assessment Cervical / Trunk Assessment: Normal   Communication Communication Communication:  (soft voice,)   Cognition Arousal/Alertness: Awake/alert Behavior During Therapy: Flat affect Overall Cognitive Status: Within Functional  Limits for tasks assessed Area of Impairment: Orientation       Following Commands: Follows one step commands consistently       General Comments: somewaht slow to respond, extra time to follow    General Comments       Exercises       Shoulder Instructions      Home Living Family/patient expects to be discharged to:: Private  residence Living Arrangements: Non-relatives/Friends   Type of Home: Apartment Home Access: Level entry     Home Layout: One level     Bathroom Shower/Tub: Teacher, early years/pre: [place]: Environmental consultant - 2 wheels   Additional Comments: patient  reported she did and did not have RW, uncertain at this time      Prior Functioning/Environment Level of Independence: Independent             OT Diagnosis: Generalized weakness   OT Problem List: Decreased strength;Decreased knowledge of use of DME or AE   OT Treatment/Interventions: Self-care/ADL training;Patient/family education;Therapeutic activities;DME and/or AE instruction    OT Goals(Current goals can be found in the care plan section) Acute Rehab OT Goals Patient Stated Goal: agreed to OOB OT Goal Formulation: With patient Time For Goal Achievement: 03/31/15 Potential to Achieve Goals: Good  OT Frequency: Min 2X/week   Barriers to D/C:            Co-evaluation PT/OT/SLP Co-Evaluation/Treatment: Yes Reason for Co-Treatment: For patient/therapist safety PT goals addressed during session: Mobility/safety with mobility OT goals addressed during session: ADL's and self-care;Proper use of Adaptive equipment and DME      End of Session Equipment Utilized During Treatment: Rolling walker  Activity Tolerance: Patient tolerated treatment well Patient left: in chair;with call bell/phone within reach;with chair alarm set   Time: 0938-1000 OT Time Calculation (min): 22 min Charges:  OT General Charges $OT Visit: 1 Procedure OT Evaluation $Initial OT Evaluation Tier I: 1 Procedure G-Codes:    Jules Schick  [phone] 03/17/2015, 12:20 PM

## 2015-03-17 NOTE — Progress Notes (Signed)
Pt O2 sat were 90-91% on RA. Sending pt back to ICU on 2L/[place]; sats are now 97%.

## 2015-03-17 NOTE — Op Note (Signed)
[place], [id number]   ENDOSCOPY PROCEDURE REPORT  PATIENT: Marissa, Brooks  MR#: [id number] BIRTHDATE: 05-06-1956 , 15  yrs. old GENDER: female ENDOSCOPIST: Gatha Mayer, MD, [facility] PROCEDURE DATE:  03/17/2015 PROCEDURE:  EGD w/ biopsy ASA CLASS:     Class III INDICATIONS:  hematemesis. MEDICATIONS: Monitored anesthesia care and Per Anesthesia TOPICAL ANESTHETIC: none  DESCRIPTION OF PROCEDURE: After the risks benefits and alternatives of the procedure were thoroughly explained, informed consent was obtained.  The [place] [id number] endoscope was introduced through the mouth and advanced to the second portion of the duodenum , Without limitations.  The instrument was slowly withdrawn as the mucosa was fully examined.    1) Diffuese severe ulcerative esophagitis - entire esophagus - biopsied and brushed 2) Clot-caste in mid and distal esophagus 3) ? distal esophageal wall defect vs clot attached to wall and artifact 4) Stomach and duodenum entirely normal.  Retroflexed views revealed no abnormalities.     The scope was then withdrawn from the patient and the procedure completed.  COMPLICATIONS: There were no immediate complications.  ENDOSCOPIC IMPRESSION: 1) Diffuese severe ulcerative esophagitis - entire esophagus - biopsied and brushed 2) Clot-caste in mid and distal esophagus 3) ? distal esophageal wall defect vs clot attached to wall and artifact 4) Stomach and duodenum entirely normal 5) She has transient decline in mental status and needed supported breathing after scope withdrawn but was ok after few mins mask ventilation with some assistance, VSS.  RECOMMENDATIONS: 1.  Gastrograffen esophgeal study to evaluate ? wall defect 2.  Continue PPI 3.  DC Octreotide 4.  Strict NPO for now - avoid oral K 5. NH3 level - hold lorazepam  eSigned:  Gatha Mayer, MD, [facility] 03/17/2015 11:43 AM

## 2015-03-18 ENCOUNTER — Encounter (HOSPITAL_COMMUNITY): Payer: Self-pay | Admitting: Internal Medicine

## 2015-03-18 DIAGNOSIS — D500 Iron deficiency anemia secondary to blood loss (chronic): Secondary | ICD-10-CM

## 2015-03-18 DIAGNOSIS — N178 Other acute kidney failure: Secondary | ICD-10-CM

## 2015-03-18 DIAGNOSIS — R112 Nausea with vomiting, unspecified: Secondary | ICD-10-CM

## 2015-03-18 DIAGNOSIS — I10 Essential (primary) hypertension: Secondary | ICD-10-CM

## 2015-03-18 DIAGNOSIS — D62 Acute posthemorrhagic anemia: Secondary | ICD-10-CM

## 2015-03-18 DIAGNOSIS — C50211 Malignant neoplasm of upper-inner quadrant of right female breast: Secondary | ICD-10-CM

## 2015-03-18 DIAGNOSIS — K521 Toxic gastroenteritis and colitis: Secondary | ICD-10-CM

## 2015-03-18 DIAGNOSIS — F418 Other specified anxiety disorders: Secondary | ICD-10-CM

## 2015-03-18 DIAGNOSIS — F319 Bipolar disorder, unspecified: Secondary | ICD-10-CM

## 2015-03-18 DIAGNOSIS — J189 Pneumonia, unspecified organism: Secondary | ICD-10-CM

## 2015-03-18 DIAGNOSIS — D6959 Other secondary thrombocytopenia: Secondary | ICD-10-CM

## 2015-03-18 DIAGNOSIS — C50411 Malignant neoplasm of upper-outer quadrant of right female breast: Secondary | ICD-10-CM

## 2015-03-18 LAB — CBC
HEMATOCRIT: 30.4 % — AB (ref 36.0–46.0)
HEMOGLOBIN: 10.1 g/dL — AB (ref 12.0–15.0)
MCH: 28.9 pg (ref 26.0–34.0)
MCHC: 33.2 g/dL (ref 30.0–36.0)
MCV: 86.9 fL (ref 78.0–100.0)
Platelets: 126 10*3/uL — ABNORMAL LOW (ref 150–400)
RBC: 3.5 MIL/uL — ABNORMAL LOW (ref 3.87–5.11)
RDW: 18.9 % — AB (ref 11.5–15.5)
WBC: 22.8 10*3/uL — ABNORMAL HIGH (ref 4.0–10.5)

## 2015-03-18 LAB — RENAL FUNCTION PANEL
ANION GAP: 3 — AB (ref 5–15)
Albumin: 2.3 g/dL — ABNORMAL LOW (ref 3.5–5.0)
BUN: 23 mg/dL — ABNORMAL HIGH (ref 6–20)
CO2: 22 mmol/L (ref 22–32)
Calcium: 7.8 mg/dL — ABNORMAL LOW (ref 8.9–10.3)
Chloride: 118 mmol/L — ABNORMAL HIGH (ref 101–111)
Creatinine, Ser: 1.28 mg/dL — ABNORMAL HIGH (ref 0.44–1.00)
GFR calc Af Amer: 53 mL/min — ABNORMAL LOW (ref >60)
GFR calc non Af Amer: 45 mL/min — ABNORMAL LOW (ref >60)
GLUCOSE: 137 mg/dL — AB (ref 65–99)
POTASSIUM: 3.9 mmol/L (ref 3.5–5.1)
Phosphorus: 1.5 mg/dL — ABNORMAL LOW (ref 2.5–4.6)
Sodium: 143 mmol/L (ref 135–145)

## 2015-03-18 LAB — GLUCOSE, CAPILLARY: GLUCOSE-CAPILLARY: 138 mg/dL — AB (ref 65–99)

## 2015-03-18 LAB — HIV ANTIBODY (ROUTINE TESTING W REFLEX): HIV SCREEN 4TH GENERATION: NONREACTIVE

## 2015-03-18 MED ORDER — CALCIUM CITRATE-VITAMIN D 500-400 MG-UNIT PO CHEW
1.0000 | CHEWABLE_TABLET | Freq: Two times a day (BID) | ORAL | Status: DC
  Administered 2015-03-18 – 2015-03-20 (×5): 1 via ORAL
  Filled 2015-03-18 (×10): qty 1

## 2015-03-18 MED ORDER — DEXTROSE-NACL 5-0.45 % IV SOLN
INTRAVENOUS | Status: DC
  Administered 2015-03-18 – 2015-03-21 (×5): via INTRAVENOUS

## 2015-03-18 NOTE — Progress Notes (Signed)
OT Cancellation Note  Patient Details Name: Marissa Brooks MRN: [id number] DOB: 1955-12-28   Cancelled Treatment:    Reason Eval/Treat Not Completed: Other (comment) Pt stating she is "comfortable" right now and doesn't want to get up right now. Explained that OT would like to help pt get up and get stronger but pt still declining OOB right now. She states "maybe later."    Jules Schick  [phone] 03/18/2015, 11:39 AM

## 2015-03-18 NOTE — Progress Notes (Signed)
[facility] Progress Note Patient Name: Marissa Brooks DOB: 03/13/1956 MRN: [id number]   Date of Service  03/18/2015  HPI/Events of Note  EICU rounds GI bleed, no active bleeding EGD today, some confusion afterwards Now stable in ICU Pneumonia Vitals stable, on room air Comfortable on camera check We need ICU beds  eICU Interventions  Transfer to tele     Intervention Category Minor Interventions: Routine modifications to care plan (e.g. PRN medications for pain, fever)  Simonne Maffucci 03/18/2015, 12:12 AM

## 2015-03-18 NOTE — Care Management Note (Signed)
Case Management Note  Patient Details  Name: Marissa Brooks MRN: [id number] Date of Birth: Jan 19, 1956  Subjective/Objective: PT recc HHPT, rw. Patient too sleepy to wake.  Left [place] agency list in rm to discuss in am.  Await choice.Will need HHPT, home rw, face to face order.                   Action/Plan:d/c plan home w/HHC.   Expected Discharge Date:                  Expected Discharge Plan:  [place]  In-House Referral:     Discharge planning Services  CM Consult  Post Acute Care Choice:    Choice offered to:  Patient  DME Arranged:    DME Agency:     HH Arranged:    [place] Agency:     Status of Service:  In process, will continue to follow  Medicare Important Message Given:  Yes-second notification given Date Medicare IM Given:    Medicare IM give by:    Date Additional Medicare IM Given:    Additional Medicare Important Message give by:     If discussed at [place] of Stay Meetings, dates discussed:    Additional Comments:  Dessa Phi, RN 03/18/2015, 3:35 PM

## 2015-03-18 NOTE — Progress Notes (Signed)
     [facility] Progress Note  Subjective:  Feeling better this AM.  Would like to drink something.  Gastrograffin study confirmed esophagitis with no other defects.  No further nausea or vomiting.    Objective:  Vital signs in last 24 hours: Temp:  [98.2 F (36.8 C)-100.6 F (38.1 C)] 99.4 F (37.4 C) (10/26 0257) Pulse Rate:  [65-93] 71 (10/26 0257) Resp:  [15-28] 20 (10/26 0257) BP: (137-167)/(38-73) 159/54 mmHg (10/26 0257) SpO2:  [95 %-100 %] 100 % (10/26 0257) Weight:  [175 lb 7.8 oz (79.6 kg)] 175 lb 7.8 oz (79.6 kg) (10/26 0257) Last BM Date: 03/18/15 General:  Alert, Well-developed, in NAD; alopecia from chemo Heart:  Regular rate and rhythm; no murmurs Pulm:  CTAB.  No W/R/R. Abdomen:  Soft, non-distended. Normal bowel sounds.  Non-tender.  Extremities:  Without edema. Neurologic:  Alert and oriented x 4;  grossly normal neurologically. Psych:  Alert and cooperative. Normal mood and affect.   Lab Results:  Recent Labs  03/17/15 0845 03/17/15 1518 03/18/15 0547  WBC 16.5* 16.4* 22.8*  HGB 10.6* 10.5* 10.1*  HCT 31.0* 31.2* 30.4*  PLT 113* 132* 126*   BMET  Recent Labs  03/16/15 0320 03/17/15 0642 03/18/15 0547  NA 138 144 143  K 2.9* 4.5 3.9  CL 105 117* 118*  CO2 22 22 22   GLUCOSE 123* 111* 137*  BUN 61* 32* 23*  CREATININE 1.73* 1.36* 1.28*  CALCIUM 6.9* 7.6* 7.8*   LFT  Recent Labs  03/16/15 0320 03/18/15 0547  PROT 5.8*  --   ALBUMIN 2.5* 2.3*  AST 73*  --   ALT 47  --   ALKPHOS 41  --   BILITOT 1.2  --    PT/INR  Recent Labs  03/15/15 2227  LABPROT 16.1*  INR 1.28    Assessment / Plan: -Severe diffuse esophagitis with clot seen on EGD 10/25.  Gastrograffin study showed only esophagitis but no other defects.  Will allow clear liquids today.  Continue IV PPI.   -Hematemesis/CGE:  Secondary to above. -Dysphagia: Secondary to above. -Breast cancer: 12 year ago and now again diagnosed just recently. Just  completed chemo treatments within the past week or so. -HCV: Treated unsuccessfully in the past several years. -ETOH abuse and polysubstance abuse: UDS was negative this admission. Is on CIWA protocol. -Hypokalemia: Resolved. -Acute blood loss anemia: Hgb stable this AM.  Received 2 units PRBC's this admission.    LOS: 3 days   ZEHR, JESSICA D.  03/18/2015, 9:41 AM  Pager number [phone]  [place] Attending  I have also seen and assessed the patient and agree with the advanced practitioner's assessment and plan. Improved - no vomiting, no odynophagia Cytology brushings no infection'Path pending  Gatha Mayer, MD, [facility] [phone] (pager) 03/18/2015 8:06 PM

## 2015-03-18 NOTE — Progress Notes (Signed)
Discussed w/ Dr Wyline Copas hgb results willl monitor

## 2015-03-18 NOTE — Progress Notes (Signed)
[facility] PROGRESS NOTE  Florabel Faulks Kershaw BTD:[id number] DOB: [birth year] DOA: 03/15/2015 PCP: Barbette Merino, MD  HPI/Brief narrative Marissa Brooks is a 59 y.o. female with PMH of hypertension, GERD, depression, anxiety, polysubstance abuse including tobacco, alcohol and cocaine abuse, HCV, breast cancer, who presents with nausea, vomiting, diarrhea and hematemesis. GI was consulted and the patient was admitted for further work up.  Assessment/Plan: #1 GI bleed Patient presented with coffee-ground emesis concern for upper GI bleed. Patient with history of alcohol abuse, hepatitis C and cirrhosis. Patient's hemoglobin was 11.1 on 03/03/2015 and on admission had dropped to 9.3 and at 7.4 on 03/16/2015. Anemia panel consistent with anemia of chronic disease. Patient status post 2 units packed red blood cells hemoglobin currently at 10.6. Patient . Patient consulted by GI and pt is s/p EGD on 10/25 with findings of esophagitis. Octreotide drip was stopped. Continued Protonix. -Diet to be advanced per GI  #2 acute blood loss anemia Secondary to problem #1. Anemia panel consistent with anemia of chronic disease. Ferritin elevated at 2098. Folate at 9.2. Patient status post 2 units packed red blood cells hemoglobin currently at 10.6 from 7.4 on 03/16/2015. Hemoglobin was 11.1 on 03/03/2015. GI was consulted and pt underwent EGD per above  #3 hypokalemia/hypocalcemia Magnesium level at 2.5. Repleted potassium. Corrected calcium at 8.8. -Will start oral calcium replacement  #4 acute renal failure Likely secondary to a prerenal azotemia secondary to GI bleed in the setting of ACE inhibitor and NSAIDs. Holding NSAIDs and ACE inhibitor. Renal ultrasound with no acute abnormality. Continue hydration with IV fluids - Renal function improving  #5 nausea/ vomiting/ diarrhea No abdominal pain. Patient with bloody bowel movements overnight. No further nausea or vomiting. Continue antiemetics. Supportive  care. - Recent Cdiff was neg  #6 tobacco abuse/alcohol abuse Continue nicotine patch. Continue the Ativan withdrawal protocol.  #7 history of cocaine abuse Patient has been counseled on cocaine cessation. Social work consulted -tox screen was neg  #8 depression/anxiety/bipolar disorder  Stable. No suicidal or homicidal ideations. Continue Abilify and Zoloft.  #9 hypertension Blood pressure elevated. Place on IV Lopressor. DC IV enalapril.  #10 history of cirrhosis and HCV infection Per Dr. Baxter Flattery note patient was noted to have undergone treatment of HCV in 1999 at [facility] per note by Aurora Mask (06/02/09). Her HCV viral load was [phone] on 12/16/2014. Once patient completes adjuvant chemotherapy per Dr. Burr Medico, the plan was to refer her to the hepatology clinic.  #11 history of breast cancer Per Dr. 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 Patient is recommended to receive adjuvant chemotherapy every 3 weeks for 4 cycles patient with fourth cycle of chemotherapy on 03/03/2015 and patient is to be referred for adjuvant irradiation after chemotherapy with adjuvant aromatase inhibitor after she completes radiation. Will inform oncology of patient's admission via Epic. Outpatient follow-up.  #12 dysphasia Dysphasia noted to be acute. Patient is immunocompromised.  - Esophagitis noted on EGD -Gastrograffin study without defects  #13 leukocytosis chest x-ray with findings suggestive of PNA involving the lingula -Pt is continued on vancomycin and fortaz empirically  #14 thrombocytopenia Likely chemotherapy-induced. Improving since admission..  #15 prophylaxis PPI for GI prophylaxis. SCDs for DVT prophylaxis.  Code Status: Full Family Communication: Pt in room Disposition Plan: Pending   Consultants:  GI  Procedures:  EGD 10/25  Antibiotics: Anti-infectives    Start      Dose/Rate Route Frequency Ordered Stop  03/17/15 1600  vancomycin (VANCOCIN) IVPB 750 mg/150 ml premix     750 mg 150 mL/hr over 60 Minutes Intravenous Every 12 hours 03/17/15 1417     03/17/15 1500  cefTAZidime (FORTAZ) 2 g in dextrose 5 % 50 mL IVPB     2 g 100 mL/hr over 30 Minutes Intravenous Every 12 hours 03/17/15 1410     03/17/15 1415  cefTAZidime (FORTAZ) 2 g in dextrose 5 % 50 mL IVPB  Status:  Discontinued     2 g 100 mL/hr over 30 Minutes Intravenous 3 times per day 03/17/15 1406 03/17/15 1410      HPI/Subjective: States feeling better today. Reports diarrhea seems to be slowing  Objective: Filed Vitals:   03/18/15 0000 03/18/15 0200 03/18/15 0257 03/18/15 1006  BP: 137/38 159/62 159/54 157/62  Pulse: 73 72 71 66  Temp: 100.2 F (37.9 C) 99.9 F (37.7 C) 99.4 F (37.4 C) 98.7 F (37.1 C)  TempSrc: Core (Comment)  Oral Oral  Resp: 26 15 20 20   Height:   5\' 2"  (1.575 m)   Weight:   79.6 kg (175 lb 7.8 oz)   SpO2: 97% 100% 100% 100%    Intake/Output Summary (Last 24 hours) at 03/18/15 1342 Last data filed at 03/18/15 0245  Gross per 24 hour  Intake 2687.42 ml  Output    250 ml  Net 2437.42 ml   Filed Weights   03/15/15 2230 03/18/15 0257  Weight: 80.3 kg (177 lb 0.5 oz) 79.6 kg (175 lb 7.8 oz)    Exam:   General:  Awake, in nad  Cardiovascular: regular, s1, s2  Respiratory: Normal resp effort, no wheezing  Abdomen: soft, nondistended  Musculoskeletal: perfused, no clubbing   Data Reviewed: Basic Metabolic Panel:  Recent Labs Lab 03/15/15 1910 03/16/15 0320 03/17/15 0642 03/18/15 0547  NA 134* 138 144 143  K 2.4* 2.9* 4.5 3.9  CL 96* 105 117* 118*  CO2 27 22 22 22   GLUCOSE 126* 123* 111* 137*  BUN 67* 61* 32* 23*  CREATININE 1.96* 1.73* 1.36* 1.28*  CALCIUM 7.5* 6.9* 7.6* 7.8*  MG  --  2.5* 2.5*  --   PHOS  --   --   --  1.5*   Liver Function Tests:  Recent Labs Lab 03/15/15 1910 03/16/15 0320 03/18/15 0547  AST 49* 73*  --    ALT 33 47  --   ALKPHOS 44 41  --   BILITOT 1.1 1.2  --   PROT 6.8 5.8*  --   ALBUMIN 2.8* 2.5* 2.3*   No results for input(s): LIPASE, AMYLASE in the last 168 hours.  Recent Labs Lab 03/15/15 2131 03/17/15 1521  AMMONIA 25 37*   CBC:  Recent Labs Lab 03/15/15 1910  03/16/15 1900 03/17/15 0641 03/17/15 0845 03/17/15 1518 03/18/15 0547  WBC 8.7  < > 12.5* 15.7* 16.5* 16.4* 22.8*  NEUTROABS 6.7  --   --   --   --   --   --   HGB 9.3*  < > 9.0* 10.6* 10.6* 10.5* 10.1*  HCT 27.3*  < > 26.3* 31.2* 31.0* 31.2* 30.4*  MCV 82.5  < > 84.3 84.6 85.6 85.0 86.9  PLT 128*  < > 114* 116* 113* 132* 126*  < > = values in this interval not displayed. Cardiac Enzymes: No results for input(s): CKTOTAL, CKMB, CKMBINDEX, TROPONINI in the last 168 hours. BNP (last 3 results) No results for input(s): BNP in the last 8760  hours.  ProBNP (last 3 results) No results for input(s): PROBNP in the last 8760 hours.  CBG:  Recent Labs Lab 03/16/15 0821 03/17/15 0758 03/18/15 0726  GLUCAP 111* 102* 138*    Recent Results (from the past 240 hour(s))  MRSA PCR Screening     Status: None   Collection Time: 03/15/15  9:46 PM  Result Value Ref Range Status   MRSA by PCR NEGATIVE NEGATIVE Final    Comment:        The GeneXpert MRSA Assay (FDA approved for NASAL specimens only), is one component of a comprehensive MRSA colonization surveillance program. It is not intended to diagnose MRSA infection nor to guide or monitor treatment for MRSA infections.   C difficile quick scan w PCR reflex     Status: None   Collection Time: 03/16/15  1:13 PM  Result Value Ref Range Status   C Diff antigen NEGATIVE NEGATIVE Final   C Diff toxin NEGATIVE NEGATIVE Final   C Diff interpretation Negative for toxigenic C. difficile  Final  Culture, Urine     Status: None (Preliminary result)   Collection Time: 03/17/15  7:57 AM  Result Value Ref Range Status   Specimen Description URINE, CATHETERIZED   Final   Special Requests NONE  Final   Culture   Final    CULTURE REINCUBATED FOR BETTER GROWTH Performed at [facility]    Report Status PENDING  Incomplete  Culture, blood (routine x 2)     Status: None (Preliminary result)   Collection Time: 03/17/15  8:25 AM  Result Value Ref Range Status   Specimen Description BLOOD LEFT FOOT  Final   Special Requests BOTTLES DRAWN AEROBIC AND ANAEROBIC [place]  Final   Culture   Final    NO GROWTH 1 DAY Performed at [facility]    Report Status PENDING  Incomplete  Culture, blood (routine x 2)     Status: None (Preliminary result)   Collection Time: 03/17/15  8:40 AM  Result Value Ref Range Status   Specimen Description BLOOD LEFT PICC  Final   Special Requests BOTTLES DRAWN AEROBIC AND ANAEROBIC 10CC  Final   Culture   Final    NO GROWTH 1 DAY Performed at [facility]    Report Status PENDING  Incomplete  Culture, sputum-assessment     Status: None   Collection Time: 03/17/15  3:08 PM  Result Value Ref Range Status   Specimen Description SPUTUM  Final   Special Requests Immunocompromised  Final   Sputum evaluation   Final    THIS SPECIMEN IS ACCEPTABLE. RESPIRATORY CULTURE REPORT TO FOLLOW.   Report Status 03/17/2015 FINAL  Final  Culture, respiratory (NON-Expectorated)     Status: None (Preliminary result)   Collection Time: 03/17/15  4:00 PM  Result Value Ref Range Status   Specimen Description SPUTUM  Final   Special Requests NONE  Final   Gram Stain   Final    RARE WBC PRESENT, PREDOMINANTLY PMN RARE SQUAMOUS EPITHELIAL CELLS PRESENT RARE GRAM POSITIVE COCCI IN PAIRS RARE GRAM NEGATIVE RODS Performed at [facility]    Culture PENDING  Incomplete   Report Status PENDING  Incomplete     Studies: Dg Chest Port 1 View  03/17/2015  CLINICAL DATA:  Leukocytosis, patient smokes EXAM: PORTABLE CHEST 1 VIEW COMPARISON:  02/21/2015 FINDINGS: Left PICC line with tip all about 10 mm into the right  atrium. No pneumothorax. Stable mild cardiac enlargement. Right  lung clear. Mild infiltrate in the lingula. IMPRESSION: 1. Infiltrate in the lingula concerning for pneumonia 2. PICC line tip projects 10 mm into the right atrium. Optimal positioning would have the tip about 1 cm proximal at the cavoatrial junction. Electronically Signed   By: Skipper Cliche M.D.   On: 03/17/2015 10:04   Dg Esophagus W/water Sol Cm  03/17/2015  CLINICAL DATA:  59 year old female status post endoscopy earlier today demonstrating diffuse severe esophagitis. Query perforation/leak. Initial encounter. EXAM: ESOPHOGRAM/BARIUM SWALLOW TECHNIQUE: Single contrast examination was performed using initially water-soluble contrast (Omnipaque 300), ultimately thin barium. FLUOROSCOPY TIME:  Radiation Exposure Index (as provided by the fluoroscopic device): If the device does not provide the exposure index: Fluoroscopy Time:  2 minutes 56 seconds Number of Acquired Images:  Two COMPARISON:  Portable chest radiograph 0835 hours today and earlier. FINDINGS: The patient tolerated the study well. She drank 15 mL of Omnipaque 300 which demonstrated severe mucosal irregularity in the distal third of the thoracic esophagus (series 6), but no obstruction to the flow of contrast through the esophagus and into the stomach, and no extravasation or leak. The study was then repeated with thin barium. The severe ulceration at the distal third of the esophagus persisted. Barium was slower to pass through the esophagus. Still, barium did reach the stomach with no extravasation or abnormal accumulation identified. IMPRESSION: Severe ulceration distal third thoracic esophagus with a degree of narrowing but no evidence of perforation or leak. Electronically Signed   By: Genevie Ann M.D.   On: 03/17/2015 16:07    Scheduled Meds: . ARIPiprazole  5 mg Oral Daily  . calcium citrate-vitamin D  1 tablet Oral BID  . cefTAZidime (FORTAZ)  IV  2 g Intravenous Q12H  .  feeding supplement  1 Container Oral TID BM  . folic acid  1 mg Oral Daily  . LORazepam  0-4 mg Intravenous Q12H  . metoprolol  5 mg Intravenous 3 times per day  . nicotine  21 mg Transdermal Daily  . [START ON 03/19/2015] pantoprazole (PROTONIX) IV  40 mg Intravenous Q12H  . potassium chloride  40 mEq Oral Once  . sertraline  100 mg Oral QHS  . sodium chloride  10-40 mL Intracatheter Q12H  . sodium chloride  3 mL Intravenous Q12H  . thiamine  100 mg Oral Daily   Or  . thiamine  100 mg Intravenous Daily  . traZODone  100 mg Oral Daily  . vancomycin  750 mg Intravenous Q12H   Continuous Infusions: . dextrose 5 % and 0.45% NaCl 100 mL/hr at 03/18/15 0630  . pantoprozole (PROTONIX) infusion 8 mg/hr (03/18/15 0431)    Principal Problem:   GIB (gastrointestinal bleeding) Active Problems:   HCV (hepatitis C virus)   THROMBOCYTOPENIA   BIPOLAR AFFECTIVE DISORDER   Substance abuse   BACK PAIN, LUMBAR   NEOPLASM, MALIGNANT, BREAST, HX OF   Alcohol dependence ([place])   MDD (major depressive disorder) ([place])   Breast cancer of upper-outer quadrant of right female breast s/p Right MRM 10/30/14   Tobacco abuse   Hypertension   Anxiety   ARF (acute renal failure) ([facility])   Hypokalemia   Essential hypertension   Depression   Acute blood loss anemia   Dysphagia   Hypocalcemia   Leukocytosis   Acute esophagitis   Hematemesis with nausea   HCAP (healthcare-associated pneumonia)    Kiffany Schelling, [facility] Pager [phone]. If 7PM-7AM, please contact night-coverage at [email or web address], password [facility]  03/18/2015, 1:42 PM  LOS: 3 days

## 2015-03-18 NOTE — Progress Notes (Signed)
Marissa Brooks   DOB:Jul 24, 1955   WU#:[id number]   GNF#:[id number]  Subjective: Pt was admitted on 10/23 for upper GI bleeding. She underwent EGD yesterday, which showed severe diffuse esophagitis with clot. She is on clear liquids now. She feels better overall, received 2 units blood transfusion since admission. She is also thinking Zosyn for pneumonia.   Objective:  Filed Vitals:   03/18/15 1404  BP: 142/55  Pulse: 67  Temp: 99.7 F (37.6 C)  Resp:     Body mass index is 32.09 kg/(m^2).  Intake/Output Summary (Last 24 hours) at 03/18/15 1932 Last data filed at 03/18/15 1800  Gross per 24 hour  Intake   2345 ml  Output    600 ml  Net   1745 ml     Sclerae unicteric  Oropharynx clear  No peripheral adenopathy  Lungs clear -- no rales or rhonchi  Heart regular rate and rhythm  Abdomen benign  MSK no focal spinal tenderness, no peripheral edema  Neuro nonfocal    CBG (last 3)   Recent Labs  03/16/15 0821 03/17/15 0758 03/18/15 0726  GLUCAP 111* 102* 138*     Labs:  Lab Results  Component Value Date   WBC 22.8* 03/18/2015   HGB 10.1* 03/18/2015   HCT 30.4* 03/18/2015   MCV 86.9 03/18/2015   PLT 126* 03/18/2015   NEUTROABS 6.7 03/15/2015    @LASTCHEMISTRY @  Urine Studies No results for input(s): UHGB, CRYS in the last 72 hours.  Invalid input(s): UACOL, UAPR, USPG, UPH, UTP, UGL, UKET, UBIL, UNIT, UROB, [place], UEPI, UWBC, [place], [place], [place], [place], [place]  Basic Metabolic Panel:  Recent Labs Lab 03/15/15 1910 03/16/15 0320 03/17/15 0642 03/18/15 0547  NA 134* 138 144 143  K 2.4* 2.9* 4.5 3.9  CL 96* 105 117* 118*  CO2 27 22 22 22   GLUCOSE 126* 123* 111* 137*  BUN 67* 61* 32* 23*  CREATININE 1.96* 1.73* 1.36* 1.28*  CALCIUM 7.5* 6.9* 7.6* 7.8*  MG  --  2.5* 2.5*  --   PHOS  --   --   --  1.5*   GFR Estimated Creatinine Clearance: 47.4 mL/min (by C-G formula based on Cr of 1.28). Liver Function Tests:  Recent Labs Lab 03/15/15 1910  03/16/15 0320 03/18/15 0547  AST 49* 73*  --   ALT 33 47  --   ALKPHOS 44 41  --   BILITOT 1.1 1.2  --   PROT 6.8 5.8*  --   ALBUMIN 2.8* 2.5* 2.3*   No results for input(s): LIPASE, AMYLASE in the last 168 hours.  Recent Labs Lab 03/15/15 2131 03/17/15 1521  AMMONIA 25 37*   Coagulation profile  Recent Labs Lab 03/15/15 2227  INR 1.28    CBC:  Recent Labs Lab 03/15/15 1910  03/16/15 1900 03/17/15 0641 03/17/15 0845 03/17/15 1518 03/18/15 0547  WBC 8.7  < > 12.5* 15.7* 16.5* 16.4* 22.8*  NEUTROABS 6.7  --   --   --   --   --   --   HGB 9.3*  < > 9.0* 10.6* 10.6* 10.5* 10.1*  HCT 27.3*  < > 26.3* 31.2* 31.0* 31.2* 30.4*  MCV 82.5  < > 84.3 84.6 85.6 85.0 86.9  PLT 128*  < > 114* 116* 113* 132* 126*  < > = values in this interval not displayed. Cardiac Enzymes: No results for input(s): CKTOTAL, CKMB, CKMBINDEX, TROPONINI in the last 168 hours. BNP: Invalid input(s): POCBNP CBG:  Recent Labs  Lab 03/16/15 0821 03/17/15 0758 03/18/15 0726  GLUCAP 111* 102* 138*   D-Dimer No results for input(s): DDIMER in the last 72 hours. Hgb A1c No results for input(s): HGBA1C in the last 72 hours. Lipid Profile No results for input(s): CHOL, HDL, LDLCALC, TRIG, CHOLHDL, LDLDIRECT in the last 72 hours. Thyroid function studies No results for input(s): TSH, T4TOTAL, T3FREE, THYROIDAB in the last 72 hours.  Invalid input(s): FREET3 Anemia work up  Recent Labs  03/16/15 0900  VITAMINB12 1333*  FOLATE 9.2  FERRITIN 2098*  TIBC 221*  IRON 162  RETICCTPCT 2.5   Microbiology Recent Results (from the past 240 hour(s))  MRSA PCR Screening     Status: None   Collection Time: 03/15/15  9:46 PM  Result Value Ref Range Status   MRSA by PCR NEGATIVE NEGATIVE Final    Comment:        The GeneXpert MRSA Assay (FDA approved for NASAL specimens only), is one component of a comprehensive MRSA colonization surveillance program. It is not intended to diagnose  MRSA infection nor to guide or monitor treatment for MRSA infections.   C difficile quick scan w PCR reflex     Status: None   Collection Time: 03/16/15  1:13 PM  Result Value Ref Range Status   C Diff antigen NEGATIVE NEGATIVE Final   C Diff toxin NEGATIVE NEGATIVE Final   C Diff interpretation Negative for toxigenic C. difficile  Final  Culture, Urine     Status: None (Preliminary result)   Collection Time: 03/17/15  7:57 AM  Result Value Ref Range Status   Specimen Description URINE, CATHETERIZED  Final   Special Requests NONE  Final   Culture   Final    CULTURE REINCUBATED FOR BETTER GROWTH Performed at [facility]    Report Status PENDING  Incomplete  Culture, blood (routine x 2)     Status: None (Preliminary result)   Collection Time: 03/17/15  8:25 AM  Result Value Ref Range Status   Specimen Description BLOOD LEFT FOOT  Final   Special Requests BOTTLES DRAWN AEROBIC AND ANAEROBIC [place]  Final   Culture   Final    NO GROWTH 1 DAY Performed at [facility]    Report Status PENDING  Incomplete  Culture, blood (routine x 2)     Status: None (Preliminary result)   Collection Time: 03/17/15  8:40 AM  Result Value Ref Range Status   Specimen Description BLOOD LEFT PICC  Final   Special Requests BOTTLES DRAWN AEROBIC AND ANAEROBIC 10CC  Final   Culture   Final    NO GROWTH 1 DAY Performed at [facility]    Report Status PENDING  Incomplete  Culture, sputum-assessment     Status: None   Collection Time: 03/17/15  3:08 PM  Result Value Ref Range Status   Specimen Description SPUTUM  Final   Special Requests Immunocompromised  Final   Sputum evaluation   Final    THIS SPECIMEN IS ACCEPTABLE. RESPIRATORY CULTURE REPORT TO FOLLOW.   Report Status 03/17/2015 FINAL  Final  Culture, respiratory (NON-Expectorated)     Status: None (Preliminary result)   Collection Time: 03/17/15  4:00 PM  Result Value Ref Range Status   Specimen Description SPUTUM   Final   Special Requests NONE  Final   Gram Stain   Final    RARE WBC PRESENT, PREDOMINANTLY PMN RARE SQUAMOUS EPITHELIAL CELLS PRESENT RARE GRAM POSITIVE COCCI IN PAIRS RARE GRAM NEGATIVE  RODS Performed at [facility]    Culture PENDING  Incomplete   Report Status PENDING  Incomplete      Studies:  Dg Chest Port 1 View  03/17/2015  CLINICAL DATA:  Leukocytosis, patient smokes EXAM: PORTABLE CHEST 1 VIEW COMPARISON:  02/21/2015 FINDINGS: Left PICC line with tip all about 10 mm into the right atrium. No pneumothorax. Stable mild cardiac enlargement. Right lung clear. Mild infiltrate in the lingula. IMPRESSION: 1. Infiltrate in the lingula concerning for pneumonia 2. PICC line tip projects 10 mm into the right atrium. Optimal positioning would have the tip about 1 cm proximal at the cavoatrial junction. Electronically Signed   By: Skipper Cliche M.D.   On: 03/17/2015 10:04   Dg Esophagus W/water Sol Cm  03/17/2015  CLINICAL DATA:  59 year old female status post endoscopy earlier today demonstrating diffuse severe esophagitis. Query perforation/leak. Initial encounter. EXAM: ESOPHOGRAM/BARIUM SWALLOW TECHNIQUE: Single contrast examination was performed using initially water-soluble contrast (Omnipaque 300), ultimately thin barium. FLUOROSCOPY TIME:  Radiation Exposure Index (as provided by the fluoroscopic device): If the device does not provide the exposure index: Fluoroscopy Time:  2 minutes 56 seconds Number of Acquired Images:  Two COMPARISON:  Portable chest radiograph 0835 hours today and earlier. FINDINGS: The patient tolerated the study well. She drank 15 mL of Omnipaque 300 which demonstrated severe mucosal irregularity in the distal third of the thoracic esophagus (series 6), but no obstruction to the flow of contrast through the esophagus and into the stomach, and no extravasation or leak. The study was then repeated with thin barium. The severe ulceration at the distal third  of the esophagus persisted. Barium was slower to pass through the esophagus. Still, barium did reach the stomach with no extravasation or abnormal accumulation identified. IMPRESSION: Severe ulceration distal third thoracic esophagus with a degree of narrowing but no evidence of perforation or leak. Electronically Signed   By: Genevie Ann M.D.   On: 03/17/2015 16:07    Assessment: 59 y.o. female  1. Upper GI bleeding from severe esophagitis 2. Anemia secondary to GI bleeding, received 2 units RBC 3. History of stage IIa breast cancer, completed adjuvant chemotherapy on 03/03/2015 4. Acute renal failure secondary to GI bleeding and dehydration 5. Nausea, vomiting and diarrhea, likely secondary to chemotherapy. C. difficile was negative 6. Left pneumonia, on broad antibiotics 7. Mild thrombocytopenia, secondary to chemotherapy 8. Deconditioning 9. Anxiety, depression, history of substance abuse 10. History of cirrhosis and hepatitis C  Plan: -appreciate GI and hospitalist team care -agree with PPI and antibiotics -monitoring CBC, and consider blood transfusion if Hb<8 -I encourage her to have PT/OT -she has completed adjuvant chemo, I will follow her in 1-2 weeks after her discharge   Truitt Merle, MD 03/18/2015  7:32 PM

## 2015-03-18 NOTE — Care Management Important Message (Signed)
Important Message  Patient Details  Name: Marissa Brooks MRN: [id number] Date of Birth: Mar 28, 1956   Medicare Important Message Given:  [facility] notification given    Camillo Flaming 03/18/2015, 11:26 AMImportant Message  Patient Details  Name: Marissa Brooks MRN: [id number] Date of Birth: Jan 25, 1956   Medicare Important Message Given:  Yes-second notification given    Camillo Flaming 03/18/2015, 11:26 AM

## 2015-03-19 DIAGNOSIS — F419 Anxiety disorder, unspecified: Secondary | ICD-10-CM

## 2015-03-19 DIAGNOSIS — K920 Hematemesis: Secondary | ICD-10-CM

## 2015-03-19 DIAGNOSIS — R110 Nausea: Secondary | ICD-10-CM

## 2015-03-19 LAB — CBC
HCT: 28.6 % — ABNORMAL LOW (ref 36.0–46.0)
Hemoglobin: 9.3 g/dL — ABNORMAL LOW (ref 12.0–15.0)
MCH: 28.1 pg (ref 26.0–34.0)
MCHC: 32.5 g/dL (ref 30.0–36.0)
MCV: 86.4 fL (ref 78.0–100.0)
PLATELETS: 135 10*3/uL — AB (ref 150–400)
RBC: 3.31 MIL/uL — AB (ref 3.87–5.11)
RDW: 19.1 % — AB (ref 11.5–15.5)
WBC: 21.2 10*3/uL — ABNORMAL HIGH (ref 4.0–10.5)

## 2015-03-19 LAB — COMPREHENSIVE METABOLIC PANEL
ALBUMIN: 2.1 g/dL — AB (ref 3.5–5.0)
ALT: 24 U/L (ref 14–54)
ANION GAP: 4 — AB (ref 5–15)
AST: 29 U/L (ref 15–41)
Alkaline Phosphatase: 37 U/L — ABNORMAL LOW (ref 38–126)
BUN: 15 mg/dL (ref 6–20)
CALCIUM: 7.7 mg/dL — AB (ref 8.9–10.3)
CO2: 21 mmol/L — AB (ref 22–32)
Chloride: 114 mmol/L — ABNORMAL HIGH (ref 101–111)
Creatinine, Ser: 1.35 mg/dL — ABNORMAL HIGH (ref 0.44–1.00)
GFR calc non Af Amer: 43 mL/min — ABNORMAL LOW (ref >60)
GFR, EST AFRICAN AMERICAN: 49 mL/min — AB (ref >60)
GLUCOSE: 103 mg/dL — AB (ref 65–99)
POTASSIUM: 3.3 mmol/L — AB (ref 3.5–5.1)
SODIUM: 139 mmol/L (ref 135–145)
TOTAL PROTEIN: 5 g/dL — AB (ref 6.5–8.1)
Total Bilirubin: 0.6 mg/dL (ref 0.3–1.2)

## 2015-03-19 LAB — URINE CULTURE: Culture: 9000

## 2015-03-19 LAB — GLUCOSE, CAPILLARY: GLUCOSE-CAPILLARY: 99 mg/dL (ref 65–99)

## 2015-03-19 LAB — LEGIONELLA PNEUMOPHILA SEROGP 1 UR AG: L. PNEUMOPHILA SEROGP 1 UR AG: POSITIVE — AB

## 2015-03-19 LAB — AMMONIA: AMMONIA: 22 umol/L (ref 9–35)

## 2015-03-19 MED ORDER — PIPERACILLIN-TAZOBACTAM 3.375 G IVPB
3.3750 g | Freq: Three times a day (TID) | INTRAVENOUS | Status: DC
  Administered 2015-03-19 – 2015-03-20 (×3): 3.375 g via INTRAVENOUS
  Filled 2015-03-19 (×4): qty 50

## 2015-03-19 MED ORDER — LACTULOSE 10 GM/15ML PO SOLN
20.0000 g | Freq: Two times a day (BID) | ORAL | Status: DC
  Administered 2015-03-19 – 2015-03-21 (×5): 20 g via ORAL
  Filled 2015-03-19 (×8): qty 30

## 2015-03-19 MED ORDER — POTASSIUM CHLORIDE CRYS ER 20 MEQ PO TBCR
40.0000 meq | EXTENDED_RELEASE_TABLET | Freq: Once | ORAL | Status: AC
  Administered 2015-03-19: 40 meq via ORAL
  Filled 2015-03-19: qty 2

## 2015-03-19 MED ORDER — POTASSIUM CHLORIDE CRYS ER 20 MEQ PO TBCR
40.0000 meq | EXTENDED_RELEASE_TABLET | Freq: Two times a day (BID) | ORAL | Status: DC
Start: 2015-03-19 — End: 2015-03-19

## 2015-03-19 MED ORDER — AZITHROMYCIN 500 MG IV SOLR
500.0000 mg | INTRAVENOUS | Status: DC
  Administered 2015-03-19 – 2015-03-21 (×3): 500 mg via INTRAVENOUS
  Filled 2015-03-19 (×4): qty 500

## 2015-03-19 NOTE — Progress Notes (Signed)
Occupational Therapy Treatment Patient Details Name: JADELIN Brooks MRN: [id number] DOB: 1955-11-19 Today's Date: 03/19/2015    History of present illness Pt is a 59 y.o. female with PMH of hypertension, GERD, depression, anxiety, polysubstance abuse including tobacco, alcohol and cocaine abuse, HCV, breast cancer, who presents with nausea, vomiting, diarrhea and hematemesis.   OT comments  Pt up to chair with min assist and walker but fatigues quickly. Pt did assist with washing periarea after having a small amount of BM in bed that she did not appear aware of. She has flat affect and doesn't elaborate on responses--she states there is someone that can help her at d/c and she states 24/7 available. As long as pt can continue to progress activity and help is available 24/7 then pt can d/c home with [facility]; otherwise she may need to consider SNF.    Follow Up Recommendations  Home health OT;Supervision/Assistance - 24 hour;Other (comment)    Equipment Recommendations  3 in 1 bedside comode;Tub/shower seat (if pt agreeable to a tubseat/cost)    Recommendations for Other Services      Precautions / Restrictions Precautions Precautions: Fall Restrictions Weight Bearing Restrictions: No       Mobility Bed Mobility Overal bed mobility: Needs Assistance Bed Mobility: Supine to Sit     Supine to sit: Min guard     General bed mobility comments: increased time.  Transfers Overall transfer level: Needs assistance Equipment used: Rolling walker (2 wheeled) Transfers: Sit to/from Stand Sit to Stand: Min assist Stand pivot transfers: Min assist       General transfer comment: min assist to rise and steady. Cues for hand placement.     Balance     Sitting balance-Leahy Scale: Fair       Standing balance-Leahy Scale: Poor                     ADL                       Lower Body Dressing: Min guard;Sitting/lateral leans Lower Body Dressing Details  (indicate cue type and reason): don/doff socks. Toilet Transfer: Minimal assistance;Stand-pivot;RW   Toileting- Clothing Manipulation and Hygiene: Minimal assistance;Moderate assistance;Sit to/from stand         General ADL Comments: Pt with flat affect during session but agreeable today to OOB to chair to eat lunch. Pt stood at EOB and noted BM on pad and pt was not aware. Assisted pt with periarea hygiene for thoroughness but she did dry posterior periarea in standing with min assist. She fatigues rapidly with activity and started to prop on walker after only briefly standing (less than 2 minutes). Pivoted to recliner with min assist. Pt states she has a friend that can help her 24/7 but doesnt elaborate on who this is, etc. Discussed tub seat option to help  with bathing as she is weak in standing and also discussed 3in1.       Vision                     Perception     Praxis      Cognition   Behavior During Therapy: Flat affect Overall Cognitive Status: Within Functional Limits for tasks assessed          Following Commands: Follows one step commands consistently       General Comments: still slow to follow commands and answer questions. Able to state current month, date,  year.     Extremity/Trunk Assessment               Exercises     Shoulder Instructions       General Comments      Pertinent Vitals/ Pain       Pain Assessment: No/denies pain  Home Living                                          Prior Functioning/Environment              Frequency Min 2X/week     Progress Toward Goals  OT Goals(current goals can now be found in the care plan section)  Progress towards OT goals: Progressing toward goals     Plan Discharge plan remains appropriate    Co-evaluation                 End of Session Equipment Utilized During Treatment: Rolling walker   Activity Tolerance Patient limited by fatigue    Patient Left in chair;with call bell/phone within reach;with chair alarm set   Nurse Communication          Time: 1250-1315 OT Time Calculation (min): 25 min  Charges: OT General Charges $OT Visit: 1 Procedure OT Treatments $Self Care/Home Management : 8-22 mins $Therapeutic Activity: 8-22 mins  Jules Schick  [phone] 03/19/2015, 1:37 PM

## 2015-03-19 NOTE — Progress Notes (Signed)
     [facility] Progress Note  Subjective:  Pathology:Esophagus, biopsy - MARKED ACUTE ESOPHAGITIS WITH ULCERATION. - NO VIRAL CYTOPATHIC EFFECT OR FUNGAL ORGANISMS. - NO INTESTINAL METAPLASIA, DYSPLASIA OR MALIGNANCY. Tol clear liquids. Hungry. Had a larger BM earlier today.No nausea or vomiting.   Objective:  Vital signs in last 24 hours: Temp:  [98.1 F (36.7 C)-99.7 F (37.6 C)] 98.2 F (36.8 C) (10/27 0619) Pulse Rate:  [64-67] 64 (10/27 0619) Resp:  [20] 20 (10/27 0619) BP: (142-157)/(55-76) 142/76 mmHg (10/27 0619) SpO2:  [100 %] 100 % (10/27 0619) Last BM Date: 03/18/15 General:   Alert,  Well-developed, in NAD, alopecia from chemo Heart:  Regular rate and rhythm; no murmurs Pulm;lungs clear Abdomen:  Soft, nontender and nondistended. Normal bowel sounds, without guarding, and without rebound.   Extremities:  Without edema. Neurologic:  Alert and  oriented x4;  grossly normal neurologically. Psych:  Alert and cooperative. Normal mood and affect.  Intake/Output from previous day: 10/26 0701 - 10/27 0700 In: 2970 [P.O.:120; I.V.:2450; IV Piggyback:400] Out: 2633 [Urine:1550] Intake/Output this shift:    Lab Results:  Recent Labs  03/17/15 1518 03/18/15 0547 03/19/15 0310  WBC 16.4* 22.8* 21.2*  HGB 10.5* 10.1* 9.3*  HCT 31.2* 30.4* 28.6*  PLT 132* 126* 135*      ASSESSMENT/PLAN:   -Severe diffuse esophagitis with clot seen on EGD 10/25. Gastrograffin study showed only esophagitis but no other defects. Will allow full liquids today. Continue IV PPI.  -Hematemesis/CGE: Secondary to above. -Dysphagia: Secondary to above. -Breast cancer: 12 year ago and now again diagnosed just recently. Just completed chemo treatments within the past week or so. -HCV: Treated unsuccessfully in the past several years. -ETOH abuse and polysubstance abuse: UDS was negative this admission. Is on CIWA protocol. -Hypokalemia:K+ 3.3 today--replacement  ordered -Acute blood loss anemia: Hgb 9.3 this am- Received 2 units PRBC's this admission.     LOS: 4 days   Hvozdovic, Vita Barley PA-C 03/19/2015, Pager [phone] Mon-Fri 8a-5p [phone] after 5p, weekends, holidays  Agree w/ Ms. Hvozdovic's note and mangement. I have added lactulose 30 cc bid given somnolence and elevated NH3.  Pathology of esopgus = ulceration - no infection. Cause not clear.  Gatha Mayer, MD, [facility] [phone] (pager) 03/19/2015 4:53 PM

## 2015-03-19 NOTE — Care Management Note (Signed)
Case Management Note  Patient Details  Name: KARSEN FELLOWS MRN: [id number] Date of Birth: [birth year]  Subjective/Objective:  Patient chose [facility] for HHPT.[facility] rep Kristen aware, & following. Await HHPT order, & face to face. [facility] dme rep Lecretia aware of home rw recc.                  Action/Plan:d/c home w/HHC/DME   Expected Discharge Date:                  Expected Discharge Plan:  [place]  In-House Referral:     Discharge planning Services  CM Consult  Post Acute Care Choice:    Choice offered to:  Patient  DME Arranged:    DME Agency:     HH Arranged:    [place] Agency:  [employer]  Status of Service:  In process, will continue to follow  Medicare Important Message Given:  Yes-second notification given Date Medicare IM Given:    Medicare IM give by:    Date Additional Medicare IM Given:    Additional Medicare Important Message give by:     If discussed at [place] of Stay Meetings, dates discussed:    Additional Comments:  Dessa Phi, RN 03/19/2015, 3:39 PM

## 2015-03-19 NOTE — Progress Notes (Signed)
ANTIBIOTIC CONSULT NOTE - INITIAL  Pharmacy Consult for vancomycin, Zosyn Indication: pneumonia  Allergies  Allergen Reactions  . Centrum Hives    Patient Measurements: Height: 5\' 2"  (157.5 cm) Weight: 175 lb 7.8 oz (79.6 kg) IBW/kg (Calculated) : 50.1  Vital Signs: Temp: 98.2 F (36.8 C) (10/27 0619) Temp Source: Oral (10/27 0619) BP: 142/76 mmHg (10/27 0619) Pulse Rate: 64 (10/27 0619) Intake/Output from previous day: 10/26 0701 - 10/27 0700 In: 2970 [P.O.:120; I.V.:2450; IV Piggyback:400] Out: 1550 [GQBVQ:9450]  Labs:  Recent Labs  03/17/15 0642  03/17/15 1518 03/18/15 0547 03/19/15 0310  WBC  --   < > 16.4* 22.8* 21.2*  HGB  --   < > 10.5* 10.1* 9.3*  PLT  --   < > 132* 126* 135*  CREATININE 1.36*  --   --  1.28* 1.35*  < > = values in this interval not displayed. Estimated Creatinine Clearance: 44.9 mL/min (by C-G formula based on Cr of 1.35). No results for input(s): VANCOTROUGH, VANCOPEAK, VANCORANDOM, GENTTROUGH, GENTPEAK, GENTRANDOM, TOBRATROUGH, TOBRAPEAK, TOBRARND, AMIKACINPEAK, AMIKACINTROU, AMIKACIN in the last 72 hours.   Assessment: 59 yo presented to ER on 10/23 with N/V/D and GIB now s/p endo procedure for evaluation of diffuse severe ulcerative esophagitis that was biopsied. Patient completed TC chemotherapy for breast cancer on 10/18 per Dr. Burr Medico and [place] also includes hx HCV/hx cirrhosis with continued alcohol abuse.  Pharmacy was consulted to dose vancomycin and ceftazidime postop on 10/25 for HAP development.  Today, ceftazidime changed to Zosyn for improved anaerobe coverage.  10/25 >> vanc >> 10/25 >> ceftaz >>  10/27 10/27 >> Zosyn >>  Today, 03/19/2015:  Tm 99.7  WBC elevated but slightly improved to 21.2  SCr increased to 1.35 with CrCl ~ 45 ml/min  Goal of Therapy:  Vancomycin trough level 15-20 mcg/ml Appropriate abx dosing, eradication of infection.   Plan:   Zosyn 3.375g IV Q8H infused over 4hrs.  Continue Vancomycin 750  mg IV q12h.  Measure Vanc trough at steady state.  Follow up renal fxn, culture results, and clinical course.  MD to consider narrowing antibiotics when improving.  Gretta Arab PharmD, BCPS Pager [phone] 03/19/2015 2:08 PM

## 2015-03-19 NOTE — Progress Notes (Signed)
[facility] PROGRESS NOTE  Marissa Brooks EUM:[id number] DOB: 1955/09/04 DOA: 03/15/2015 PCP: Barbette Merino, MD  HPI/Brief narrative Marissa Brooks is a 59 y.o. female with PMH of hypertension, GERD, depression, anxiety, polysubstance abuse including tobacco, alcohol and cocaine abuse, HCV, breast cancer, who presents with nausea, vomiting, diarrhea and hematemesis. GI was consulted and the patient was admitted for further work up.  Assessment/Plan: #1 GI bleed Patient presented with coffee-ground emesis concern for upper GI bleed. Patient with history of alcohol abuse, hepatitis C and cirrhosis. Patient's hemoglobin was 11.1 on 03/03/2015 and on admission had dropped to 9.3 and at 7.4 on 03/16/2015. Anemia panel consistent with anemia of chronic disease. Patient status post 2 units packed red blood cells hemoglobin currently at 10.6. Patient . Patient consulted by GI and pt is s/p EGD on 10/25 with findings of esophagitis. Octreotide drip was stopped. Continued Protonix. -Diet currently being advanced per GI  #2 acute blood loss anemia -Secondary to problem #1. Anemia panel consistent with anemia of chronic disease. Ferritin elevated at 2098. Folate at 9.2. Patient status post 2 units packed red blood cells hemoglobin currently at 10.6 from 7.4 on 03/16/2015. Hemoglobin was 11.1 on 03/03/2015. GI was consulted and pt underwent EGD per above - Hgb remains stable thus far  #3 hypokalemia/hypocalcemia Magnesium level at 2.5. Repleted potassium. Corrected calcium at 8.8. -On oral calcium replacement  #4 acute renal failure Likely secondary to a prerenal azotemia secondary to GI bleed in the setting of ACE inhibitor and NSAIDs. Holding NSAIDs and ACE inhibitor. Renal ultrasound with no acute abnormality. Continue hydration with IV fluids - Renal function stable  #5 nausea/ vomiting/ diarrhea No abdominal pain. Patient with bloody bowel movements overnight. No further nausea or vomiting.  Continue antiemetics. Supportive care. - Recent Cdiff was neg  #6 tobacco abuse/alcohol abuse - Continue nicotine patch. Continue the Ativan withdrawal protocol.  #7 history of cocaine abuse - Patient has been counseled on cocaine cessation. Social work was consulted - tox screen was neg this admission  #8 depression/anxiety/bipolar disorder  Stable. No suicidal or homicidal ideations. Continue Abilify and Zoloft.  #9 hypertension Blood pressure elevated. On IV Lopressor. DC IV enalapril.  #10 history of cirrhosis and HCV infection Per Dr. Baxter Flattery note patient was noted to have undergone treatment of HCV in 1999 at [place] per note by Aurora Mask (06/02/09). Her HCV viral load was [phone] on 12/16/2014. Once patient completes adjuvant chemotherapy per Dr. Burr Medico, the plan was to refer her to the hepatology clinic.  #11 history of breast cancer Per Dr. 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 Patient is recommended to receive adjuvant chemotherapy every 3 weeks for 4 cycles patient with fourth cycle of chemotherapy on 03/03/2015 and patient is to be referred for adjuvant irradiation after chemotherapy with adjuvant aromatase inhibitor after she completes radiation. Have notified oncology of patient's admission via Epic. Outpatient follow-up.  #12 dysphasia Dysphasia noted to be acute. Patient is immunocompromised.  - Esophagitis noted on EGD -Gastrograffin study without defects  #13 leukocytosis chest x-ray with findings suggestive of PNA involving the lingula -Pt is continued on vancomycin and fortaz empirically  #14 thrombocytopenia Likely chemotherapy-induced. Improving since admission..  #15 prophylaxis PPI for GI prophylaxis. SCDs for DVT prophylaxis.  Code Status: Full Family Communication: Pt in room Disposition Plan: Pending   Consultants:  GI  Procedures:  EGD  10/25  Antibiotics: Anti-infectives  Start     Dose/Rate Route Frequency Ordered Stop   03/17/15 1600  vancomycin (VANCOCIN) IVPB 750 mg/150 ml premix     750 mg 150 mL/hr over 60 Minutes Intravenous Every 12 hours 03/17/15 1417     03/17/15 1500  cefTAZidime (FORTAZ) 2 g in dextrose 5 % 50 mL IVPB     2 g 100 mL/hr over 30 Minutes Intravenous Every 12 hours 03/17/15 1410     03/17/15 1415  cefTAZidime (FORTAZ) 2 g in dextrose 5 % 50 mL IVPB  Status:  Discontinued     2 g 100 mL/hr over 30 Minutes Intravenous 3 times per day 03/17/15 1406 03/17/15 1410      HPI/Subjective: States feeling better today. Reports diarrhea seems to be slowing  Objective: Filed Vitals:   03/18/15 1006 03/18/15 1404 03/18/15 2137 03/19/15 0619  BP: 157/62 142/55 145/65 142/76  Pulse: 66 67 65 64  Temp: 98.7 F (37.1 C) 99.7 F (37.6 C) 98.1 F (36.7 C) 98.2 F (36.8 C)  TempSrc: Oral Oral Oral Oral  Resp: 20  20 20   Height:      Weight:      SpO2: 100% 100% 100% 100%    Intake/Output Summary (Last 24 hours) at 03/19/15 1342 Last data filed at 03/19/15 0700  Gross per 24 hour  Intake   2970 ml  Output   1550 ml  Net   1420 ml   Filed Weights   03/15/15 2230 03/18/15 0257  Weight: 80.3 kg (177 lb 0.5 oz) 79.6 kg (175 lb 7.8 oz)    Exam:   General:  Awake, in nad  Cardiovascular: regular, s1, s2  Respiratory: Normal resp effort, no wheezing  Abdomen: soft, nondistended  Musculoskeletal: perfused, no clubbing   Data Reviewed: Basic Metabolic Panel:  Recent Labs Lab 03/15/15 1910 03/16/15 0320 03/17/15 0642 03/18/15 0547 03/19/15 0310  NA 134* 138 144 143 139  K 2.4* 2.9* 4.5 3.9 3.3*  CL 96* 105 117* 118* 114*  CO2 27 22 22 22  21*  GLUCOSE 126* 123* 111* 137* 103*  BUN 67* 61* 32* 23* 15  CREATININE 1.96* 1.73* 1.36* 1.28* 1.35*  CALCIUM 7.5* 6.9* 7.6* 7.8* 7.7*  MG  --  2.5* 2.5*  --   --   PHOS  --   --   --  1.5*  --    Liver Function Tests:  Recent  Labs Lab 03/15/15 1910 03/16/15 0320 03/18/15 0547 03/19/15 0310  AST 49* 73*  --  29  ALT 33 47  --  24  ALKPHOS 44 41  --  37*  BILITOT 1.1 1.2  --  0.6  PROT 6.8 5.8*  --  5.0*  ALBUMIN 2.8* 2.5* 2.3* 2.1*   No results for input(s): LIPASE, AMYLASE in the last 168 hours.  Recent Labs Lab 03/15/15 2131 03/17/15 1521  AMMONIA 25 37*   CBC:  Recent Labs Lab 03/15/15 1910  03/17/15 0641 03/17/15 0845 03/17/15 1518 03/18/15 0547 03/19/15 0310  WBC 8.7  < > 15.7* 16.5* 16.4* 22.8* 21.2*  NEUTROABS 6.7  --   --   --   --   --   --   HGB 9.3*  < > 10.6* 10.6* 10.5* 10.1* 9.3*  HCT 27.3*  < > 31.2* 31.0* 31.2* 30.4* 28.6*  MCV 82.5  < > 84.6 85.6 85.0 86.9 86.4  PLT 128*  < > 116* 113* 132* 126* 135*  < > = values in this interval  not displayed. Cardiac Enzymes: No results for input(s): CKTOTAL, CKMB, CKMBINDEX, TROPONINI in the last 168 hours. BNP (last 3 results) No results for input(s): BNP in the last 8760 hours.  ProBNP (last 3 results) No results for input(s): PROBNP in the last 8760 hours.  CBG:  Recent Labs Lab 03/16/15 0821 03/17/15 0758 03/18/15 0726 03/19/15 0731  GLUCAP 111* 102* 138* 99    Recent Results (from the past 240 hour(s))  MRSA PCR Screening     Status: None   Collection Time: 03/15/15  9:46 PM  Result Value Ref Range Status   MRSA by PCR NEGATIVE NEGATIVE Final    Comment:        The GeneXpert MRSA Assay (FDA approved for NASAL specimens only), is one component of a comprehensive MRSA colonization surveillance program. It is not intended to diagnose MRSA infection nor to guide or monitor treatment for MRSA infections.   C difficile quick scan w PCR reflex     Status: None   Collection Time: 03/16/15  1:13 PM  Result Value Ref Range Status   C Diff antigen NEGATIVE NEGATIVE Final   C Diff toxin NEGATIVE NEGATIVE Final   C Diff interpretation Negative for toxigenic C. difficile  Final  Culture, Urine     Status: None    Collection Time: 03/17/15  7:57 AM  Result Value Ref Range Status   Specimen Description URINE, CATHETERIZED  Final   Special Requests NONE  Final   Culture   Final    9,000 COLONIES/mL INSIGNIFICANT GROWTH Performed at [facility]    Report Status 03/19/2015 FINAL  Final  Culture, blood (routine x 2)     Status: None (Preliminary result)   Collection Time: 03/17/15  8:25 AM  Result Value Ref Range Status   Specimen Description BLOOD LEFT FOOT  Final   Special Requests BOTTLES DRAWN AEROBIC AND ANAEROBIC [place]  Final   Culture   Final    NO GROWTH 2 DAYS Performed at [facility]    Report Status PENDING  Incomplete  Culture, blood (routine x 2)     Status: None (Preliminary result)   Collection Time: 03/17/15  8:40 AM  Result Value Ref Range Status   Specimen Description BLOOD LEFT PICC  Final   Special Requests BOTTLES DRAWN AEROBIC AND ANAEROBIC 10CC  Final   Culture   Final    NO GROWTH 2 DAYS Performed at [facility]    Report Status PENDING  Incomplete  Culture, sputum-assessment     Status: None   Collection Time: 03/17/15  3:08 PM  Result Value Ref Range Status   Specimen Description SPUTUM  Final   Special Requests Immunocompromised  Final   Sputum evaluation   Final    THIS SPECIMEN IS ACCEPTABLE. RESPIRATORY CULTURE REPORT TO FOLLOW.   Report Status 03/17/2015 FINAL  Final  Culture, respiratory (NON-Expectorated)     Status: None (Preliminary result)   Collection Time: 03/17/15  4:00 PM  Result Value Ref Range Status   Specimen Description SPUTUM  Final   Special Requests NONE  Final   Gram Stain   Final    RARE WBC PRESENT, PREDOMINANTLY PMN RARE SQUAMOUS EPITHELIAL CELLS PRESENT RARE GRAM POSITIVE COCCI IN PAIRS RARE GRAM NEGATIVE RODS Performed at [facility]    Culture   Final    NORMAL OROPHARYNGEAL FLORA Performed at [facility]    Report Status PENDING  Incomplete     Studies: Dg  Esophagus W/water Sol  Cm  03/17/2015  CLINICAL DATA:  59 year old female status post endoscopy earlier today demonstrating diffuse severe esophagitis. Query perforation/leak. Initial encounter. EXAM: ESOPHOGRAM/BARIUM SWALLOW TECHNIQUE: Single contrast examination was performed using initially water-soluble contrast (Omnipaque 300), ultimately thin barium. FLUOROSCOPY TIME:  Radiation Exposure Index (as provided by the fluoroscopic device): If the device does not provide the exposure index: Fluoroscopy Time:  2 minutes 56 seconds Number of Acquired Images:  Two COMPARISON:  Portable chest radiograph 0835 hours today and earlier. FINDINGS: The patient tolerated the study well. She drank 15 mL of Omnipaque 300 which demonstrated severe mucosal irregularity in the distal third of the thoracic esophagus (series 6), but no obstruction to the flow of contrast through the esophagus and into the stomach, and no extravasation or leak. The study was then repeated with thin barium. The severe ulceration at the distal third of the esophagus persisted. Barium was slower to pass through the esophagus. Still, barium did reach the stomach with no extravasation or abnormal accumulation identified. IMPRESSION: Severe ulceration distal third thoracic esophagus with a degree of narrowing but no evidence of perforation or leak. Electronically Signed   By: Genevie Ann M.D.   On: 03/17/2015 16:07    Scheduled Meds: . ARIPiprazole  5 mg Oral Daily  . calcium citrate-vitamin D  1 tablet Oral BID  . cefTAZidime (FORTAZ)  IV  2 g Intravenous Q12H  . feeding supplement  1 Container Oral TID BM  . folic acid  1 mg Oral Daily  . lactulose  20 g Oral BID  . LORazepam  0-4 mg Intravenous Q12H  . metoprolol  5 mg Intravenous 3 times per day  . nicotine  21 mg Transdermal Daily  . pantoprazole (PROTONIX) IV  40 mg Intravenous Q12H  . sertraline  100 mg Oral QHS  . sodium chloride  10-40 mL Intracatheter Q12H  . sodium chloride  3 mL Intravenous Q12H  .  thiamine  100 mg Oral Daily   Or  . thiamine  100 mg Intravenous Daily  . traZODone  100 mg Oral Daily  . vancomycin  750 mg Intravenous Q12H   Continuous Infusions: . dextrose 5 % and 0.45% NaCl 100 mL/hr at 03/19/15 0542    Principal Problem:   GIB (gastrointestinal bleeding) Active Problems:   HCV (hepatitis C virus)   THROMBOCYTOPENIA   BIPOLAR AFFECTIVE DISORDER   Substance abuse   BACK PAIN, LUMBAR   NEOPLASM, MALIGNANT, BREAST, HX OF   Alcohol dependence ([place])   MDD (major depressive disorder) ([place])   Breast cancer of upper-outer quadrant of right female breast s/p Right MRM 10/30/14   Tobacco abuse   Hypertension   Anxiety   ARF (acute renal failure) ([facility])   Hypokalemia   Essential hypertension   Depression   Acute blood loss anemia   Dysphagia   Hypocalcemia   Leukocytosis   Acute esophagitis   Hematemesis with nausea   HCAP (healthcare-associated pneumonia)    [facility], [facility] Pager [phone]. If 7PM-7AM, please contact night-coverage at [email or web address], password [facility] 03/19/2015, 1:42 PM  LOS: 4 days

## 2015-03-20 DIAGNOSIS — R1310 Dysphagia, unspecified: Secondary | ICD-10-CM

## 2015-03-20 DIAGNOSIS — K209 Esophagitis, unspecified: Secondary | ICD-10-CM

## 2015-03-20 LAB — CULTURE, RESPIRATORY W GRAM STAIN

## 2015-03-20 LAB — CULTURE, RESPIRATORY: CULTURE: NORMAL

## 2015-03-20 LAB — BASIC METABOLIC PANEL
ANION GAP: 4 — AB (ref 5–15)
BUN: 11 mg/dL (ref 6–20)
CALCIUM: 8.2 mg/dL — AB (ref 8.9–10.3)
CO2: 21 mmol/L — AB (ref 22–32)
Chloride: 117 mmol/L — ABNORMAL HIGH (ref 101–111)
Creatinine, Ser: 1.28 mg/dL — ABNORMAL HIGH (ref 0.44–1.00)
GFR, EST AFRICAN AMERICAN: 53 mL/min — AB (ref >60)
GFR, EST NON AFRICAN AMERICAN: 45 mL/min — AB (ref >60)
Glucose, Bld: 124 mg/dL — ABNORMAL HIGH (ref 65–99)
POTASSIUM: 3.4 mmol/L — AB (ref 3.5–5.1)
Sodium: 142 mmol/L (ref 135–145)

## 2015-03-20 LAB — CBC
HCT: 30.7 % — ABNORMAL LOW (ref 36.0–46.0)
HEMOGLOBIN: 10.2 g/dL — AB (ref 12.0–15.0)
MCH: 29.1 pg (ref 26.0–34.0)
MCHC: 33.2 g/dL (ref 30.0–36.0)
MCV: 87.5 fL (ref 78.0–100.0)
Platelets: 189 10*3/uL (ref 150–400)
RBC: 3.51 MIL/uL — AB (ref 3.87–5.11)
RDW: 19.3 % — ABNORMAL HIGH (ref 11.5–15.5)
WBC: 21.9 10*3/uL — ABNORMAL HIGH (ref 4.0–10.5)

## 2015-03-20 LAB — GLUCOSE, CAPILLARY: Glucose-Capillary: 107 mg/dL — ABNORMAL HIGH (ref 65–99)

## 2015-03-20 LAB — AMMONIA: Ammonia: 49 umol/L — ABNORMAL HIGH (ref 9–35)

## 2015-03-20 MED ORDER — ENSURE ENLIVE PO LIQD
237.0000 mL | Freq: Two times a day (BID) | ORAL | Status: DC
  Administered 2015-03-20 – 2015-03-22 (×3): 237 mL via ORAL

## 2015-03-20 MED ORDER — POTASSIUM CHLORIDE CRYS ER 20 MEQ PO TBCR
40.0000 meq | EXTENDED_RELEASE_TABLET | Freq: Once | ORAL | Status: AC
  Administered 2015-03-20: 40 meq via ORAL
  Filled 2015-03-20: qty 2

## 2015-03-20 NOTE — Progress Notes (Signed)
[facility] PROGRESS NOTE  Marissa Brooks VZD:[id number] DOB: 1955-11-02 DOA: 03/15/2015 PCP: Barbette Merino, MD  HPI/Brief narrative Marissa Brooks is a 59 y.o. female with PMH of hypertension, GERD, depression, anxiety, polysubstance abuse including tobacco, alcohol and cocaine abuse, HCV, breast cancer, who presents with nausea, vomiting, diarrhea and hematemesis. GI was consulted and the patient was admitted for further work up.  Assessment/Plan: #1 GI bleed Patient presented with coffee-ground emesis concern for upper GI bleed. Patient with history of alcohol abuse, hepatitis C and cirrhosis. Patient's hemoglobin was 11.1 on 03/03/2015 and on admission had dropped to 9.3 and at 7.4 on 03/16/2015. Anemia panel consistent with anemia of chronic disease. Patient status post 2 units packed red blood cells hemoglobin currently at 10.6. Patient . Patient consulted by GI and pt is s/p EGD on 10/25 with findings of esophagitis. Octreotide drip was stopped. Continued Protonix. -Diet currently being advanced per GI  #2 acute blood loss anemia -Secondary to problem #1. Anemia panel consistent with anemia of chronic disease. Ferritin elevated at 2098. Folate at 9.2. Patient status post 2 units packed red blood cells hemoglobin currently at 10.6 from 7.4 on 03/16/2015. Hemoglobin was 11.1 on 03/03/2015. GI was consulted and pt underwent EGD per above - Hgb remains stable thus far  #3 hypokalemia/hypocalcemia Magnesium level at 2.5. Repleted potassium. Corrected calcium at 8.8. -On oral calcium replacement  #4 acute renal failure Likely secondary to a prerenal azotemia secondary to GI bleed in the setting of ACE inhibitor and NSAIDs. Holding NSAIDs and ACE inhibitor. Renal ultrasound with no acute abnormality. Continue hydration with IV fluids - Renal function stable  #5 nausea/ vomiting/ diarrhea No abdominal pain. Patient with bloody bowel movements overnight. No further nausea or vomiting.  Continue antiemetics. Supportive care. - Recent Cdiff was neg  #6 tobacco abuse/alcohol abuse - Continue nicotine patch. Continue the Ativan withdrawal protocol.  #7 history of cocaine abuse - Patient has been counseled on cocaine cessation. Social work was consulted - tox screen was neg this admission  #8 depression/anxiety/bipolar disorder  Stable. No suicidal or homicidal ideations. Continue Abilify and Zoloft.  #9 hypertension Blood pressure elevated. On IV Lopressor. Had Strawberry IV enalapril.  #10 history of cirrhosis and HCV infection Per Dr. Baxter Flattery note patient was noted to have undergone treatment of HCV in 1999 at [facility] per note by Aurora Mask (06/02/09). Her HCV viral load was [phone] on 12/16/2014. Once patient completes adjuvant chemotherapy per Dr. Burr Medico, the plan was to refer her to the hepatology clinic.  #11 history of breast cancer Per Dr. 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 Patient is recommended to receive adjuvant chemotherapy every 3 weeks for 4 cycles patient with fourth cycle of chemotherapy on 03/03/2015 and patient is to be referred for adjuvant irradiation after chemotherapy with adjuvant aromatase inhibitor after she completes radiation. Have notified oncology of patient's admission via Epic. Outpatient follow-up.  #12 dysphasia Dysphasia noted to be acute. Patient is immunocompromised.  - Esophagitis noted on EGD -Gastrograffin study without defects  #13 Sepsis with Legionella Pneumonia - hest x-ray with findings suggestive of PNA involving the lingula -Initially continued on vancomycin and fortaz empirically -Legionella antigen positive -Narrowed antibiotics to azithrymycin alone -WBC remains elevated at over 21k. Low grade temps of 75F. Pt recently encephalopathic  #14 thrombocytopenia Likely chemotherapy-induced. Improving since  admission..  #15 prophylaxis PPI for GI prophylaxis. SCDs for DVT prophylaxis.  Code  Status: Full Family Communication: Pt in room Disposition Plan: Pending   Consultants:  GI  Procedures:  EGD 10/25  Antibiotics: Anti-infectives    Start     Dose/Rate Route Frequency Ordered Stop   03/19/15 2200  azithromycin (ZITHROMAX) 500 mg in dextrose 5 % 250 mL IVPB     500 mg 250 mL/hr over 60 Minutes Intravenous Every 24 hours 03/19/15 2055     03/19/15 1430  piperacillin-tazobactam (ZOSYN) IVPB 3.375 g  Status:  Discontinued     3.375 g 12.5 mL/hr over 240 Minutes Intravenous Every 8 hours 03/19/15 1410 03/20/15 1317   03/17/15 1600  vancomycin (VANCOCIN) IVPB 750 mg/150 ml premix  Status:  Discontinued     750 mg 150 mL/hr over 60 Minutes Intravenous Every 12 hours 03/17/15 1417 03/20/15 1317   03/17/15 1500  cefTAZidime (FORTAZ) 2 g in dextrose 5 % 50 mL IVPB  Status:  Discontinued     2 g 100 mL/hr over 30 Minutes Intravenous Every 12 hours 03/17/15 1410 03/19/15 1409   03/17/15 1415  cefTAZidime (FORTAZ) 2 g in dextrose 5 % 50 mL IVPB  Status:  Discontinued     2 g 100 mL/hr over 30 Minutes Intravenous 3 times per day 03/17/15 1406 03/17/15 1410      HPI/Subjective: Reports still feeling generally ill. States still having loose stools  Objective: Filed Vitals:   03/19/15 1500 03/19/15 2102 03/20/15 0555 03/20/15 1300  BP: 155/60 142/78 143/76 152/89  Pulse: 68 87 76 69  Temp: 98.6 F (37 C) 99.6 F (37.6 C) 99.4 F (37.4 C) 99.7 F (37.6 C)  TempSrc: Oral Oral Oral Oral  Resp: 19 18 18 18   Height:      Weight:      SpO2:  100% 100% 100%    Intake/Output Summary (Last 24 hours) at 03/20/15 1520 Last data filed at 03/20/15 1324  Gross per 24 hour  Intake   3190 ml  Output   1600 ml  Net   1590 ml   Filed Weights   03/15/15 2230 03/18/15 0257  Weight: 80.3 kg (177 lb 0.5 oz) 79.6 kg (175 lb 7.8 oz)    Exam:   General:  Awake, in nad, laying in  bed  Cardiovascular: regular, s1, s2  Respiratory: Normal resp effort, no wheezing  Abdomen: soft, nondistended, pos BS  Musculoskeletal: perfused, no clubbing, no cyanosis  Data Reviewed: Basic Metabolic Panel:  Recent Labs Lab 03/16/15 0320 03/17/15 0642 03/18/15 0547 03/19/15 0310 03/20/15 0450  NA 138 144 143 139 142  K 2.9* 4.5 3.9 3.3* 3.4*  CL 105 117* 118* 114* 117*  CO2 22 22 22  21* 21*  GLUCOSE 123* 111* 137* 103* 124*  BUN 61* 32* 23* 15 11  CREATININE 1.73* 1.36* 1.28* 1.35* 1.28*  CALCIUM 6.9* 7.6* 7.8* 7.7* 8.2*  MG 2.5* 2.5*  --   --   --   PHOS  --   --  1.5*  --   --    Liver Function Tests:  Recent Labs Lab 03/15/15 1910 03/16/15 0320 03/18/15 0547 03/19/15 0310  AST 49* 73*  --  29  ALT 33 47  --  24  ALKPHOS 44 41  --  37*  BILITOT 1.1 1.2  --  0.6  PROT 6.8 5.8*  --  5.0*  ALBUMIN 2.8* 2.5* 2.3* 2.1*   No results for input(s): LIPASE, AMYLASE in the last 168 hours.  Recent Labs Lab 03/15/15 2131 03/17/15 1521  03/19/15 1420 03/20/15 0450  AMMONIA 25 37* 22 49*   CBC:  Recent Labs Lab 03/15/15 1910  03/17/15 0845 03/17/15 1518 03/18/15 0547 03/19/15 0310 03/20/15 0450  WBC 8.7  < > 16.5* 16.4* 22.8* 21.2* 21.9*  NEUTROABS 6.7  --   --   --   --   --   --   HGB 9.3*  < > 10.6* 10.5* 10.1* 9.3* 10.2*  HCT 27.3*  < > 31.0* 31.2* 30.4* 28.6* 30.7*  MCV 82.5  < > 85.6 85.0 86.9 86.4 87.5  PLT 128*  < > 113* 132* 126* 135* 189  < > = values in this interval not displayed. Cardiac Enzymes: No results for input(s): CKTOTAL, CKMB, CKMBINDEX, TROPONINI in the last 168 hours. BNP (last 3 results) No results for input(s): BNP in the last 8760 hours.  ProBNP (last 3 results) No results for input(s): PROBNP in the last 8760 hours.  CBG:  Recent Labs Lab 03/16/15 0821 03/17/15 0758 03/18/15 0726 03/19/15 0731 03/20/15 0738  GLUCAP 111* 102* 138* 99 107*    Recent Results (from the past 240 hour(s))  MRSA PCR Screening      Status: None   Collection Time: 03/15/15  9:46 PM  Result Value Ref Range Status   MRSA by PCR NEGATIVE NEGATIVE Final    Comment:        The GeneXpert MRSA Assay (FDA approved for NASAL specimens only), is one component of a comprehensive MRSA colonization surveillance program. It is not intended to diagnose MRSA infection nor to guide or monitor treatment for MRSA infections.   C difficile quick scan w PCR reflex     Status: None   Collection Time: 03/16/15  1:13 PM  Result Value Ref Range Status   C Diff antigen NEGATIVE NEGATIVE Final   C Diff toxin NEGATIVE NEGATIVE Final   C Diff interpretation Negative for toxigenic C. difficile  Final  Culture, Urine     Status: None   Collection Time: 03/17/15  7:57 AM  Result Value Ref Range Status   Specimen Description URINE, CATHETERIZED  Final   Special Requests NONE  Final   Culture   Final    9,000 COLONIES/mL INSIGNIFICANT GROWTH Performed at [facility]    Report Status 03/19/2015 FINAL  Final  Culture, blood (routine x 2)     Status: None (Preliminary result)   Collection Time: 03/17/15  8:25 AM  Result Value Ref Range Status   Specimen Description BLOOD LEFT FOOT  Final   Special Requests BOTTLES DRAWN AEROBIC AND ANAEROBIC [place]  Final   Culture   Final    NO GROWTH 3 DAYS Performed at [facility]    Report Status PENDING  Incomplete  Culture, blood (routine x 2)     Status: None (Preliminary result)   Collection Time: 03/17/15  8:40 AM  Result Value Ref Range Status   Specimen Description BLOOD LEFT PICC  Final   Special Requests BOTTLES DRAWN AEROBIC AND ANAEROBIC 10CC  Final   Culture   Final    NO GROWTH 3 DAYS Performed at [facility]    Report Status PENDING  Incomplete  Culture, sputum-assessment     Status: None   Collection Time: 03/17/15  3:08 PM  Result Value Ref Range Status   Specimen Description SPUTUM  Final   Special Requests Immunocompromised  Final   Sputum  evaluation   Final    THIS SPECIMEN IS ACCEPTABLE. RESPIRATORY CULTURE  REPORT TO FOLLOW.   Report Status 03/17/2015 FINAL  Final  Culture, respiratory (NON-Expectorated)     Status: None   Collection Time: 03/17/15  4:00 PM  Result Value Ref Range Status   Specimen Description SPUTUM  Final   Special Requests NONE  Final   Gram Stain   Final    RARE WBC PRESENT, PREDOMINANTLY PMN RARE SQUAMOUS EPITHELIAL CELLS PRESENT RARE GRAM POSITIVE COCCI IN PAIRS RARE GRAM NEGATIVE RODS Performed at [facility]    Culture   Final    NORMAL OROPHARYNGEAL FLORA Performed at [facility]    Report Status 03/20/2015 FINAL  Final     Studies: No results found.  Scheduled Meds: . ARIPiprazole  5 mg Oral Daily  . azithromycin  500 mg Intravenous Q24H  . calcium citrate-vitamin D  1 tablet Oral BID  . feeding supplement (ENSURE ENLIVE)  237 mL Oral BID BM  . folic acid  1 mg Oral Daily  . lactulose  20 g Oral BID  . metoprolol  5 mg Intravenous 3 times per day  . nicotine  21 mg Transdermal Daily  . pantoprazole (PROTONIX) IV  40 mg Intravenous Q12H  . sertraline  100 mg Oral QHS  . sodium chloride  10-40 mL Intracatheter Q12H  . sodium chloride  3 mL Intravenous Q12H  . thiamine  100 mg Oral Daily   Or  . thiamine  100 mg Intravenous Daily  . traZODone  100 mg Oral Daily   Continuous Infusions: . dextrose 5 % and 0.45% NaCl 100 mL/hr at 03/20/15 0355    Principal Problem:   GIB (gastrointestinal bleeding) Active Problems:   HCV (hepatitis C virus)   THROMBOCYTOPENIA   BIPOLAR AFFECTIVE DISORDER   Substance abuse   BACK PAIN, LUMBAR   NEOPLASM, MALIGNANT, BREAST, HX OF   Alcohol dependence ([place])   MDD (major depressive disorder) ([place])   Breast cancer of upper-outer quadrant of right female breast s/p Right MRM 10/30/14   Tobacco abuse   Hypertension   Anxiety   ARF (acute renal failure) ([facility])   Hypokalemia   Essential hypertension   Depression   Acute  blood loss anemia   Dysphagia   Hypocalcemia   Leukocytosis   Acute esophagitis   Hematemesis with nausea   HCAP (healthcare-associated pneumonia)    Marissa Brooks, [facility] Pager [phone]. If 7PM-7AM, please contact night-coverage at [email or web address], password [facility] 03/20/2015, 3:20 PM  LOS: 5 days

## 2015-03-20 NOTE — Progress Notes (Signed)
Nutrition Follow-up  DOCUMENTATION CODES:   Obesity unspecified  INTERVENTION:  - Will d/c Boost Breeze  - Will order Ensure Enlive  BID, each supplement provides 350 kcal and 20 grams of protein - Encourage PO intakes at meals and with supplements - RD will continue to monitor for needs  NUTRITION DIAGNOSIS:   Inadequate oral intake related to dysphagia as evidenced by per patient/family report. -ongoing, improving  GOAL:   Patient will meet greater than or equal to 90% of their needs -unmet  MONITOR:   PO intake, Supplement acceptance, Weight trends, Labs, Skin, I & O's  ASSESSMENT:   59 y.o. female with PMH of hypertension, GERD, depression, anxiety, polysubstance abuse including tobacco/alcohol/cocaine abuse, HCV, breast cancer. She presented to [facility] ED on 10/23 with complaints nausea, vomiting, diarrhea, and hematemesis.  10/28 Pt consumed 0% breakfast and lunch on CLD yesterday and then advanced to FLD for dinner; states she consumed a small amount of dinner. She was just starting breakfast of toast and grits at time of RD visit and states she is feeling hungry and denies abdominal pain or nausea. Pt not overly interested in talking to RD at this time. EGD done 10/25 and showed severe ulcerative esophagitis.   Will d/c Boost Breeze and order Ensure Enlive now that diet advanced as Ensure provides more protein which would be beneficial for pt. Not meeting needs; will continue to monitor for needs. Medications reviewed. Labs reviewed; K: 3.4 mmol/L, Cl: 117 mmol/L, creatinine elevated, Ca: 8.2 mg/dL, GFR: 53.    10/24 Patient not able to provide much history, RN in room. Per RN, pt has been having pain with swallowing. Diet has just been advanced to clear liquids. Pt will be NPO after midnight for EGD tomorrow. RD to order nutritional supplement while patient is on clear liquid diet.  Per weight history, pt has lost 8 lb x 2 months, insignificant for time frame.  Nutrition  focused physical exam shows no sign of depletion of muscle mass or body fat.   Diet Order:  DIET SOFT Room service appropriate?: Yes; Fluid consistency:: Thin  Skin:  Reviewed, no issues  Last BM:  10/28  Height:   Ht Readings from Last 1 Encounters:  03/18/15 5\' 2"  (1.575 m)    Weight:   Wt Readings from Last 1 Encounters:  03/18/15 175 lb 7.8 oz (79.6 kg)    Ideal Body Weight:  50 kg  BMI:  Body mass index is 32.09 kg/(m^2).  Estimated Nutritional Needs:   Kcal:  1700-1900  Protein:  75-85g  Fluid:  2L/day  EDUCATION NEEDS:   No education needs identified at this time     Jarome Matin, RD, LDN Inpatient Clinical Dietitian Pager # [phone] After hours/weekend pager # [phone]

## 2015-03-20 NOTE — Progress Notes (Signed)
Quick Note:  Reviewed at hospital - no letter/recall ______

## 2015-03-20 NOTE — Care Management Important Message (Signed)
Important Message  Patient Details  Name: Marissa Brooks MRN: [id number] Date of Birth: 07-27-1955   Medicare Important Message Given:  Yes-third notification given    Shelda Altes 03/20/2015, 2:47 [place] Message  Patient Details  Name: Marissa Brooks MRN: [id number] Date of Birth: [birth year]   Medicare Important Message Given:  Yes-third notification given    Shelda Altes 03/20/2015, 2:47 PM

## 2015-03-20 NOTE — Progress Notes (Signed)
     [facility] Progress Note  Subjective:   Subjective: Pathology:Esophagus, biopsy - MARKED ACUTE ESOPHAGITIS WITH ULCERATION. - NO VIRAL CYTOPATHIC EFFECT OR FUNGAL ORGANISMS. - NO INTESTINAL METAPLASIA, DYSPLASIA OR MALIGNANCY. Tol clear liquids. Hungry. Had a larger BM earlier today.No nausea or vomiting.Was started on lactulose yesterday. Ammonia today 45. Pt alert, oriented to time, place, person.  Objective:  Vital signs in last 24 hours: Temp:  [98.6 F (37 C)-99.6 F (37.6 C)] 99.4 F (37.4 C) (10/28 0555) Pulse Rate:  [68-87] 76 (10/28 0555) Resp:  [18-19] 18 (10/28 0555) BP: (142-155)/(60-78) 143/76 mmHg (10/28 0555) SpO2:  [100 %] 100 % (10/28 0555) Last BM Date: 03/20/15 General:   Alert,  Well-developed,  in NAD Heart:  Regular rate and rhythm; no murmurs Pulm;lungs clear Abdomen:  Soft, nontender and nondistended. Normal bowel sounds, without guarding, and without rebound.   Extremities:  Without edema. Neurologic: Alert and  oriented x4;  grossly normal neurologically. Psych:  Alert and cooperative. Affect flat.  Lab Results:  Recent Labs  03/18/15 0547 03/19/15 0310 03/20/15 0450  WBC 22.8* 21.2* 21.9*  HGB 10.1* 9.3* 10.2*  HCT 30.4* 28.6* 30.7*  PLT 126* 135* 189   BMET  Recent Labs  03/18/15 0547 03/19/15 0310 03/20/15 0450  NA 143 139 142  K 3.9 3.3* 3.4*  CL 118* 114* 117*  CO2 22 21* 21*  GLUCOSE 137* 103* 124*  BUN 23* 15 11  CREATININE 1.28* 1.35* 1.28*  CALCIUM 7.8* 7.7* 8.2*    ASSESSMENT/PLAN:  -Severe diffuse esophagitis with clot seen on EGD 10/25. Gastrograffin study showed only esophagitis but no other defects. Will allow soft diet today. Continue PPI.  -Hematemesis/CGE: Secondary to above. -Dysphagia: Secondary to above. -Breast cancer: 12 year ago and now again diagnosed just recently. Just completed chemo treatments within the past week or so. -HCV: Treated unsuccessfully in the past several  years. -ETOH abuse and polysubstance abuse: UDS was negative this admission. Is on CIWA protocol. -Hypokalemia:K+ 3.4 today--replacement ordered -Acute blood loss anemia: Hgb 9.3 this am- Received 2 units PRBC's this admission.      LOS: 5 days   Hvozdovic, Vita Barley PA-C 03/20/2015, Pager [phone] Mon-Fri 8a-5p [phone] after 5p, weekends, holidays  [place] [facility] Attending  I have also seen and assessed the patient and agree with the advanced practitioner's assessment and plan. She seems to be slowly improving - Tx w/ bid PPI and carafate.  Lactulose for presumed hepatic encephalopathy  Gatha Mayer, MD, [facility] [phone] (pager) 03/20/2015 7:15 PM

## 2015-03-20 NOTE — Progress Notes (Signed)
Physical Therapy Treatment Patient Details Name: Marissa Brooks MRN: [id number] DOB: June 10, 1955 Today's Date: 03/20/2015    History of Present Illness Pt is a 59 y.o. female with PMH of hypertension, GERD, depression, anxiety, polysubstance abuse including tobacco, alcohol and cocaine abuse, HCV, breast cancer, who presents with nausea, vomiting, diarrhea and hematemesis.    PT Comments    Progressing with mobility.   Follow Up Recommendations  Home health PT;Supervision/Assistance - 24 hour     Equipment Recommendations  Rolling walker with 5" wheels    Recommendations for Other Services       Precautions / Restrictions Precautions Precautions: Fall Restrictions Weight Bearing Restrictions: No    Mobility  Bed Mobility Overal bed mobility: Needs Assistance Bed Mobility: Supine to Sit     Supine to sit: HOB elevated;Supervision     General bed mobility comments: increased time.  Transfers Overall transfer level: Needs assistance Equipment used: Rolling walker (2 wheeled) Transfers: Sit to/from Stand Sit to Stand: Min assist         General transfer comment: small amount of assist to rise, stabilize, control descent. VCs safety, hand placement  Ambulation/Gait Ambulation/Gait assistance: Min assist Ambulation Distance (Feet): 60 Feet Assistive device: Rolling walker (2 wheeled) Gait Pattern/deviations: Step-through pattern;Decreased stride length;Wide base of support     General Gait Details: assist to stabilize. VCs safety.    Stairs            Wheelchair Mobility    Modified Rankin (Stroke Patients Only)       Balance Overall balance assessment: Needs assistance         Standing balance support: Bilateral upper extremity supported;During functional activity Standing balance-Leahy Scale: Poor                      Cognition Arousal/Alertness: Awake/alert Behavior During Therapy: Flat affect           Following  Commands: Follows one step commands with increased time;Follows one step commands consistently            Exercises      General Comments        Pertinent Vitals/Pain Pain Assessment: No/denies pain    Home Living                      Prior Function            PT Goals (current goals can now be found in the care plan section) Progress towards PT goals: Progressing toward goals    Frequency  Min 3X/week    PT Plan Current plan remains appropriate    Co-evaluation             End of Session Equipment Utilized During Treatment: Gait belt Activity Tolerance: Patient tolerated treatment well Patient left: in chair;with call bell/phone within reach;with chair alarm set     Time: 1435-1446 PT Time Calculation (min) (ACUTE ONLY): 11 min  Charges:  $Gait Training: 8-22 mins                    G Codes:      Weston Anna, MPT Pager: [phone]

## 2015-03-21 DIAGNOSIS — K209 Esophagitis, unspecified without bleeding: Secondary | ICD-10-CM | POA: Insufficient documentation

## 2015-03-21 DIAGNOSIS — E7220 Disorder of urea cycle metabolism, unspecified: Secondary | ICD-10-CM

## 2015-03-21 DIAGNOSIS — N179 Acute kidney failure, unspecified: Secondary | ICD-10-CM | POA: Insufficient documentation

## 2015-03-21 LAB — BASIC METABOLIC PANEL
Anion gap: 2 — ABNORMAL LOW (ref 5–15)
BUN: 8 mg/dL (ref 6–20)
CHLORIDE: 119 mmol/L — AB (ref 101–111)
CO2: 21 mmol/L — ABNORMAL LOW (ref 22–32)
CREATININE: 1.18 mg/dL — AB (ref 0.44–1.00)
Calcium: 8.1 mg/dL — ABNORMAL LOW (ref 8.9–10.3)
GFR calc Af Amer: 58 mL/min — ABNORMAL LOW (ref >60)
GFR, EST NON AFRICAN AMERICAN: 50 mL/min — AB (ref >60)
Glucose, Bld: 105 mg/dL — ABNORMAL HIGH (ref 65–99)
Potassium: 3.7 mmol/L (ref 3.5–5.1)
SODIUM: 142 mmol/L (ref 135–145)

## 2015-03-21 LAB — GLUCOSE, CAPILLARY: GLUCOSE-CAPILLARY: 95 mg/dL (ref 65–99)

## 2015-03-21 LAB — CBC
HCT: 27.2 % — ABNORMAL LOW (ref 36.0–46.0)
Hemoglobin: 9.1 g/dL — ABNORMAL LOW (ref 12.0–15.0)
MCH: 28.9 pg (ref 26.0–34.0)
MCHC: 33.5 g/dL (ref 30.0–36.0)
MCV: 86.3 fL (ref 78.0–100.0)
PLATELETS: 194 10*3/uL (ref 150–400)
RBC: 3.15 MIL/uL — AB (ref 3.87–5.11)
RDW: 19.1 % — AB (ref 11.5–15.5)
WBC: 14.7 10*3/uL — ABNORMAL HIGH (ref 4.0–10.5)

## 2015-03-21 NOTE — Progress Notes (Signed)
[facility] PROGRESS NOTE  Marissa Brooks EXN:[id number] DOB: [birth year] DOA: 03/15/2015 PCP: Barbette Merino, MD  HPI/Brief narrative Marissa Brooks is a 59 y.o. female with PMH of hypertension, GERD, depression, anxiety, polysubstance abuse including tobacco, alcohol and cocaine abuse, HCV, breast cancer, who presents with nausea, vomiting, diarrhea and hematemesis. GI was consulted and the patient was admitted for further work up.  Assessment/Plan: #1 GI bleed Patient presented with coffee-ground emesis concern for upper GI bleed. Patient with history of alcohol abuse, hepatitis C and cirrhosis. Patient's hemoglobin was 11.1 on 03/03/2015 and on admission had dropped to 9.3 and at 7.4 on 03/16/2015. Anemia panel consistent with anemia of chronic disease. Patient status post 2 units packed red blood cells hemoglobin currently at 10.6. Patient . Patient consulted by GI and pt is s/p EGD on 10/25 with findings of esophagitis. Octreotide drip was stopped. Continued Protonix. -GI recs for d/c on PPI and carafate with PRN low dose lactulose  #2 acute blood loss anemia -Secondary to problem #1. Anemia panel consistent with anemia of chronic disease. Ferritin elevated at 2098. Folate at 9.2. Patient status post 2 units packed red blood cells hemoglobin currently at 10.6 from 7.4 on 03/16/2015. Hemoglobin was 11.1 on 03/03/2015. GI was consulted and pt underwent EGD per above - Hgb remains stable thus far  #3 hypokalemia/hypocalcemia Magnesium level was at 2.5. Repleted potassium. Corrected calcium at 8.8. -On oral calcium replacement  #4 acute renal failure Likely secondary to a prerenal azotemia secondary to GI bleed in the setting of ACE inhibitor and NSAIDs. Holding NSAIDs and ACE inhibitor. Renal ultrasound with no acute abnormality. Continue hydration with IV fluids - Renal function stable  #5 nausea/ vomiting/ diarrhea No abdominal pain. Much improved. - Recent Cdiff was neg  #6 tobacco  abuse/alcohol abuse - Continue nicotine patch. Continue the Ativan withdrawal protocol.  #7 history of cocaine abuse - Patient has been counseled on cocaine cessation. Social work was consulted - tox screen was neg this admission  #8 depression/anxiety/bipolar disorder  Stable. No suicidal or homicidal ideations. Continue Abilify and Zoloft.  #9 hypertension Blood pressure elevated. On IV Lopressor. Had [place] IV enalapril.  #10 history of cirrhosis and HCV infection Per Dr. Baxter Flattery note patient was noted to have undergone treatment of HCV in 1999 at [facility] per note by Aurora Mask (06/02/09). Her HCV viral load was [phone] on 12/16/2014. Once patient completes adjuvant chemotherapy per Dr. Burr Medico, the plan was to refer her to the hepatology clinic.  #11 history of breast cancer Per Dr. 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 Patient is recommended to receive adjuvant chemotherapy every 3 weeks for 4 cycles patient with fourth cycle of chemotherapy on 03/03/2015 and patient is to be referred for adjuvant irradiation after chemotherapy with adjuvant aromatase inhibitor after she completes radiation. Have notified oncology of patient's admission via Epic. Outpatient follow-up.  #12 dysphasia Dysphasia noted to be acute. Patient is immunocompromised.  - Esophagitis noted on EGD -Gastrograffin study without defects  #13 Sepsis with Legionella Pneumonia - chest x-ray with findings suggestive of PNA involving the lingula -Initially continued on vancomycin and fortaz empirically -Legionella antigen positive -Narrowed antibiotics to azithrymycin alone -Initially WBC peaked at over 22k and pt was encephalopathic -WBC has improved nicely overnight, currently 14k  #14 thrombocytopenia Likely chemotherapy-induced. Improving since admission..  #15 prophylaxis PPI for GI prophylaxis. SCDs for  DVT prophylaxis.  Code Status: Full  Family Communication: Pt in room Disposition Plan: Pending   Consultants:  GI  Procedures:  EGD 10/25  Antibiotics: Anti-infectives    Start     Dose/Rate Route Frequency Ordered Stop   03/19/15 2200  azithromycin (ZITHROMAX) 500 mg in dextrose 5 % 250 mL IVPB     500 mg 250 mL/hr over 60 Minutes Intravenous Every 24 hours 03/19/15 2055     03/19/15 1430  piperacillin-tazobactam (ZOSYN) IVPB 3.375 g  Status:  Discontinued     3.375 g 12.5 mL/hr over 240 Minutes Intravenous Every 8 hours 03/19/15 1410 03/20/15 1317   03/17/15 1600  vancomycin (VANCOCIN) IVPB 750 mg/150 ml premix  Status:  Discontinued     750 mg 150 mL/hr over 60 Minutes Intravenous Every 12 hours 03/17/15 1417 03/20/15 1317   03/17/15 1500  cefTAZidime (FORTAZ) 2 g in dextrose 5 % 50 mL IVPB  Status:  Discontinued     2 g 100 mL/hr over 30 Minutes Intravenous Every 12 hours 03/17/15 1410 03/19/15 1409   03/17/15 1415  cefTAZidime (FORTAZ) 2 g in dextrose 5 % 50 mL IVPB  Status:  Discontinued     2 g 100 mL/hr over 30 Minutes Intravenous 3 times per day 03/17/15 1406 03/17/15 1410      HPI/Subjective: Feels much better  Objective: Filed Vitals:   03/20/15 1300 03/20/15 2119 03/21/15 0245 03/21/15 0539  BP: 152/89 177/68 155/87 141/65  Pulse: 69 60  65  Temp: 99.7 F (37.6 C) 99.2 F (37.3 C)  98.3 F (36.8 C)  TempSrc: Oral Oral  Oral  Resp: 18 18  18   Height:      Weight:      SpO2: 100% 100%  100%    Intake/Output Summary (Last 24 hours) at 03/21/15 1312 Last data filed at 03/21/15 1025  Gross per 24 hour  Intake   2840 ml  Output   2200 ml  Net    640 ml   Filed Weights   03/15/15 2230 03/18/15 0257  Weight: 80.3 kg (177 lb 0.5 oz) 79.6 kg (175 lb 7.8 oz)    Exam:   General:  Awake, in nad, laying in bed, working on puzzle  Cardiovascular: regular, s1, s2  Respiratory: Normal resp effort, no wheezing  Abdomen: soft, nondistended, pos  BS  Musculoskeletal: perfused, no clubbing, no cyanosis  Data Reviewed: Basic Metabolic Panel:  Recent Labs Lab 03/16/15 0320 03/17/15 0642 03/18/15 0547 03/19/15 0310 03/20/15 0450 03/21/15 0500  NA 138 144 143 139 142 142  K 2.9* 4.5 3.9 3.3* 3.4* 3.7  CL 105 117* 118* 114* 117* 119*  CO2 22 22 22  21* 21* 21*  GLUCOSE 123* 111* 137* 103* 124* 105*  BUN 61* 32* 23* 15 11 8   CREATININE 1.73* 1.36* 1.28* 1.35* 1.28* 1.18*  CALCIUM 6.9* 7.6* 7.8* 7.7* 8.2* 8.1*  MG 2.5* 2.5*  --   --   --   --   PHOS  --   --  1.5*  --   --   --    Liver Function Tests:  Recent Labs Lab 03/15/15 1910 03/16/15 0320 03/18/15 0547 03/19/15 0310  AST 49* 73*  --  29  ALT 33 47  --  24  ALKPHOS 44 41  --  37*  BILITOT 1.1 1.2  --  0.6  PROT 6.8 5.8*  --  5.0*  ALBUMIN 2.8* 2.5* 2.3* 2.1*   No results for input(s): LIPASE, AMYLASE in the last 168 hours.  Recent Labs Lab 03/15/15 2131 03/17/15 1521 03/19/15 1420 03/20/15 0450  AMMONIA 25 37* 22 49*   CBC:  Recent Labs Lab 03/15/15 1910  03/17/15 1518 03/18/15 0547 03/19/15 0310 03/20/15 0450 03/21/15 0500  WBC 8.7  < > 16.4* 22.8* 21.2* 21.9* 14.7*  NEUTROABS 6.7  --   --   --   --   --   --   HGB 9.3*  < > 10.5* 10.1* 9.3* 10.2* 9.1*  HCT 27.3*  < > 31.2* 30.4* 28.6* 30.7* 27.2*  MCV 82.5  < > 85.0 86.9 86.4 87.5 86.3  PLT 128*  < > 132* 126* 135* 189 194  < > = values in this interval not displayed. Cardiac Enzymes: No results for input(s): CKTOTAL, CKMB, CKMBINDEX, TROPONINI in the last 168 hours. BNP (last 3 results) No results for input(s): BNP in the last 8760 hours.  ProBNP (last 3 results) No results for input(s): PROBNP in the last 8760 hours.  CBG:  Recent Labs Lab 03/17/15 0758 03/18/15 0726 03/19/15 0731 03/20/15 0738 03/21/15 0737  GLUCAP 102* 138* 99 107* 95    Recent Results (from the past 240 hour(s))  MRSA PCR Screening     Status: None   Collection Time: 03/15/15  9:46 PM  Result  Value Ref Range Status   MRSA by PCR NEGATIVE NEGATIVE Final    Comment:        The GeneXpert MRSA Assay (FDA approved for NASAL specimens only), is one component of a comprehensive MRSA colonization surveillance program. It is not intended to diagnose MRSA infection nor to guide or monitor treatment for MRSA infections.   C difficile quick scan w PCR reflex     Status: None   Collection Time: 03/16/15  1:13 PM  Result Value Ref Range Status   C Diff antigen NEGATIVE NEGATIVE Final   C Diff toxin NEGATIVE NEGATIVE Final   C Diff interpretation Negative for toxigenic C. difficile  Final  Culture, Urine     Status: None   Collection Time: 03/17/15  7:57 AM  Result Value Ref Range Status   Specimen Description URINE, CATHETERIZED  Final   Special Requests NONE  Final   Culture   Final    9,000 COLONIES/mL INSIGNIFICANT GROWTH Performed at [facility]    Report Status 03/19/2015 FINAL  Final  Culture, blood (routine x 2)     Status: None (Preliminary result)   Collection Time: 03/17/15  8:25 AM  Result Value Ref Range Status   Specimen Description BLOOD LEFT FOOT  Final   Special Requests BOTTLES DRAWN AEROBIC AND ANAEROBIC [place]  Final   Culture   Final    NO GROWTH 3 DAYS Performed at [facility]    Report Status PENDING  Incomplete  Culture, blood (routine x 2)     Status: None (Preliminary result)   Collection Time: 03/17/15  8:40 AM  Result Value Ref Range Status   Specimen Description BLOOD LEFT PICC  Final   Special Requests BOTTLES DRAWN AEROBIC AND ANAEROBIC 10CC  Final   Culture   Final    NO GROWTH 3 DAYS Performed at [facility]    Report Status PENDING  Incomplete  Culture, sputum-assessment     Status: None   Collection Time: 03/17/15  3:08 PM  Result Value Ref Range Status   Specimen Description SPUTUM  Final   Special Requests Immunocompromised  Final   Sputum evaluation   Final  THIS SPECIMEN IS ACCEPTABLE. RESPIRATORY  CULTURE REPORT TO FOLLOW.   Report Status 03/17/2015 FINAL  Final  Culture, respiratory (NON-Expectorated)     Status: None   Collection Time: 03/17/15  4:00 PM  Result Value Ref Range Status   Specimen Description SPUTUM  Final   Special Requests NONE  Final   Gram Stain   Final    RARE WBC PRESENT, PREDOMINANTLY PMN RARE SQUAMOUS EPITHELIAL CELLS PRESENT RARE GRAM POSITIVE COCCI IN PAIRS RARE GRAM NEGATIVE RODS Performed at [facility]    Culture   Final    NORMAL OROPHARYNGEAL FLORA Performed at [facility]    Report Status 03/20/2015 FINAL  Final     Studies: No results found.  Scheduled Meds: . ARIPiprazole  5 mg Oral Daily  . azithromycin  500 mg Intravenous Q24H  . calcium citrate-vitamin D  1 tablet Oral BID  . feeding supplement (ENSURE ENLIVE)  237 mL Oral BID BM  . folic acid  1 mg Oral Daily  . lactulose  20 g Oral BID  . metoprolol  5 mg Intravenous 3 times per day  . nicotine  21 mg Transdermal Daily  . pantoprazole (PROTONIX) IV  40 mg Intravenous Q12H  . sertraline  100 mg Oral QHS  . sodium chloride  10-40 mL Intracatheter Q12H  . sodium chloride  3 mL Intravenous Q12H  . thiamine  100 mg Oral Daily   Or  . thiamine  100 mg Intravenous Daily  . traZODone  100 mg Oral Daily   Continuous Infusions: . dextrose 5 % and 0.45% NaCl 100 mL/hr at 03/20/15 2357    Principal Problem:   GIB (gastrointestinal bleeding) Active Problems:   HCV (hepatitis C virus)   THROMBOCYTOPENIA   BIPOLAR AFFECTIVE DISORDER   Substance abuse   BACK PAIN, LUMBAR   NEOPLASM, MALIGNANT, BREAST, HX OF   Alcohol dependence ([place])   MDD (major depressive disorder) ([place])   Breast cancer of upper-outer quadrant of right female breast s/p Right MRM 10/30/14   Tobacco abuse   Hypertension   Anxiety   ARF (acute renal failure) ([facility])   Hypokalemia   Essential hypertension   Depression   Acute blood loss anemia   Dysphagia   Hypocalcemia   Leukocytosis    Acute esophagitis   Hematemesis with nausea   HCAP (healthcare-associated pneumonia)    Fortunato Nordin, [facility] Pager [phone]. If 7PM-7AM, please contact night-coverage at [email or web address], password [facility] 03/21/2015, 1:12 PM  LOS: 6 days

## 2015-03-21 NOTE — Progress Notes (Signed)
   Patient Name: Marissa Brooks Date of Encounter: 03/21/2015, 12:26 PM    Subjective  Improving, tolerating solids. Less lethargic   Objective  BP 141/65 mmHg  Pulse 65  Temp(Src) 98.3 F (36.8 C) (Oral)  Resp 18  Ht 5\' 2"  (1.575 m)  Wt 175 lb 7.8 oz (79.6 kg)  BMI 32.09 kg/m2  SpO2 100%  NAD  CBC Latest Ref Rng 03/21/2015 03/20/2015 03/19/2015  WBC 4.0 - 10.5 K/uL 14.7(H) 21.9(H) 21.2(H)  Hemoglobin 12.0 - 15.0 g/dL 9.1(L) 10.2(L) 9.3(L)  Hematocrit 36.0 - 46.0 % 27.2(L) 30.7(L) 28.6(L)  Platelets 150 - 400 K/uL 194 189 135(L)      Assessment and Plan  Esophagitis Hyperammonemia - possible encephalopathy  Continue current Rx Am signing off but available if needed  Would dc on PPI and carafate and low dose lactulose may be needed  Gatha Mayer, MD, [facility] [phone] (pager) 03/21/2015 12:26 PM

## 2015-03-22 LAB — CBC
HEMATOCRIT: 25.6 % — AB (ref 36.0–46.0)
Hemoglobin: 8.4 g/dL — ABNORMAL LOW (ref 12.0–15.0)
MCH: 28.8 pg (ref 26.0–34.0)
MCHC: 32.8 g/dL (ref 30.0–36.0)
MCV: 87.7 fL (ref 78.0–100.0)
Platelets: 166 10*3/uL (ref 150–400)
RBC: 2.92 MIL/uL — ABNORMAL LOW (ref 3.87–5.11)
RDW: 19.2 % — AB (ref 11.5–15.5)
WBC: 9.6 10*3/uL (ref 4.0–10.5)

## 2015-03-22 LAB — CULTURE, BLOOD (ROUTINE X 2)
CULTURE: NO GROWTH
Culture: NO GROWTH

## 2015-03-22 LAB — GLUCOSE, CAPILLARY: GLUCOSE-CAPILLARY: 103 mg/dL — AB (ref 65–99)

## 2015-03-22 LAB — BASIC METABOLIC PANEL
Anion gap: 3 — ABNORMAL LOW (ref 5–15)
BUN: 6 mg/dL (ref 6–20)
CHLORIDE: 116 mmol/L — AB (ref 101–111)
CO2: 21 mmol/L — ABNORMAL LOW (ref 22–32)
Calcium: 7.7 mg/dL — ABNORMAL LOW (ref 8.9–10.3)
Creatinine, Ser: 1 mg/dL (ref 0.44–1.00)
GFR calc Af Amer: >60 mL/min (ref >60)
GFR calc non Af Amer: >60 mL/min (ref >60)
GLUCOSE: 112 mg/dL — AB (ref 65–99)
POTASSIUM: 3.3 mmol/L — AB (ref 3.5–5.1)
Sodium: 140 mmol/L (ref 135–145)

## 2015-03-22 MED ORDER — POTASSIUM CHLORIDE CRYS ER 20 MEQ PO TBCR
40.0000 meq | EXTENDED_RELEASE_TABLET | Freq: Once | ORAL | Status: AC
  Administered 2015-03-22: 40 meq via ORAL
  Filled 2015-03-22: qty 2

## 2015-03-22 MED ORDER — ONDANSETRON HCL 4 MG PO TABS: 4.0000 mg | ORAL_TABLET | Freq: Four times a day (QID) | ORAL | Status: DC | PRN

## 2015-03-22 MED ORDER — AZITHROMYCIN 500 MG PO TABS: 500.0000 mg | ORAL_TABLET | Freq: Every day | ORAL | Status: DC

## 2015-03-22 MED ORDER — PANTOPRAZOLE SODIUM 40 MG PO TBEC: 40.0000 mg | DELAYED_RELEASE_TABLET | Freq: Every day | ORAL | Status: DC

## 2015-03-22 MED ORDER — SUCRALFATE 1 GM/10ML PO SUSP: 1.0000 g | Freq: Three times a day (TID) | ORAL | Status: DC

## 2015-03-22 NOTE — Discharge Summary (Signed)
Physician Discharge Summary  Marissa Brooks ZHG:[id number] DOB: [birth year] DOA: 03/15/2015  PCP: Barbette Merino, MD  Admit date: 03/15/2015 Discharge date: 03/22/2015  Time spent: 20 minutes  Recommendations for Outpatient Follow-up:  1. Follow up with PCP in 1-2 weeks   Discharge Diagnoses:  Principal Problem:   GIB (gastrointestinal bleeding) Active Problems:   HCV (hepatitis C virus)   THROMBOCYTOPENIA   BIPOLAR AFFECTIVE DISORDER   Substance abuse   BACK PAIN, LUMBAR   NEOPLASM, MALIGNANT, BREAST, HX OF   Alcohol dependence ([place])   MDD (major depressive disorder) ([place])   Breast cancer of upper-outer quadrant of right female breast s/p Right MRM 10/30/14   Tobacco abuse   Hypertension   Anxiety   ARF (acute renal failure) ([facility])   Hypokalemia   Essential hypertension   Depression   Acute blood loss anemia   Dysphagia   Hypocalcemia   Leukocytosis   Acute esophagitis   Hematemesis with nausea   HCAP (healthcare-associated pneumonia)   AKI (acute kidney injury) ([place])   Esophagitis   Discharge Condition: Improved  Diet recommendation: Regular  Filed Weights   03/15/15 2230 03/18/15 0257  Weight: 80.3 kg (177 lb 0.5 oz) 79.6 kg (175 lb 7.8 oz)    History of present illness:  Please review dictated H and P from 10/23 for details. Briefly, pt is a 59 y.o. female with PMH of hypertension, GERD, depression, anxiety, polysubstance abuse including tobacco, alcohol and cocaine abuse, HCV, breast cancer, who presents with nausea, vomiting, diarrhea and hematemesis. GI was consulted and the patient was admitted for further work up.  Hospital Course:  #1 GI bleed Patient presented with coffee-ground emesis concern for upper GI bleed. Patient with history of alcohol abuse, hepatitis C and cirrhosis. Patient's hemoglobin was 11.1 on 03/03/2015 and on admission had dropped to 9.3 and at 7.4 on 03/16/2015. Anemia panel consistent with anemia of chronic disease. Patient status post 2  units packed red blood cells hemoglobin currently at 10.6. Patient . Patient consulted by GI and pt is s/p EGD on 10/25 with findings of esophagitis. Octreotide drip was stopped. Continued on Protonix. -GI recs for d/c on PPI and carafate. Would have patient follow up closely as an outpatient  #2 acute blood loss anemia -Secondary to problem #1. Anemia panel consistent with anemia of chronic disease. Ferritin elevated at 2098. Folate at 9.2. Patient status post 2 units packed red blood cells hemoglobin currently at 10.6 from 7.4 on 03/16/2015. Hemoglobin was 11.1 on 03/03/2015. GI was consulted and pt underwent EGD per above - Hgb remains stable thus far  #3 hypokalemia/hypocalcemia Magnesium level was at 2.5. Repleted potassium. Corrected calcium at 8.8. -On oral calcium replacement  #4 acute renal failure Likely secondary to a prerenal azotemia secondary to GI bleed in the setting of ACE inhibitor and NSAIDs. Holding NSAIDs and ACE inhibitor. Renal ultrasound with no acute abnormality. Continue hydration with IV fluids - Renal function stable  #5 nausea/ vomiting/ diarrhea No abdominal pain. Much improved. - Recent Cdiff was neg  #6 tobacco abuse/alcohol abuse - Continue nicotine patch. Continue the Ativan withdrawal protocol.  #7 history of cocaine abuse - Patient has been counseled on cocaine cessation. Social work was consulted - tox screen was neg this admission  #8 depression/anxiety/bipolar disorder  Stable. No suicidal or homicidal ideations. Continue Abilify and Zoloft.  #9 hypertension Blood pressure elevated. On IV Lopressor. Had [place] IV enalapril.  #10 history of cirrhosis and HCV infection Per Dr. Baxter Flattery note patient  was noted to have undergone treatment of HCV in 1999 at [facility] per note by Aurora Mask (06/02/09). Her HCV viral load was [phone] on 12/16/2014. Once patient completes adjuvant chemotherapy per Dr. Burr Medico, the plan was to refer her to the  hepatology clinic.  #11 history of breast cancer Per Dr. 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 Patient is recommended to receive adjuvant chemotherapy every 3 weeks for 4 cycles patient with fourth cycle of chemotherapy on 03/03/2015 and patient is to be referred for adjuvant irradiation after chemotherapy with adjuvant aromatase inhibitor after she completes radiation. Have notified oncology of patient's admission via Epic. Outpatient follow-up.  #12 dysphasia Dysphasia noted to be acute. Patient is immunocompromised.  - Esophagitis noted on EGD -Gastrograffin study without defects  #13 Sepsis with Legionella Pneumonia - chest x-ray with findings suggestive of PNA involving the lingula -Initially continued on vancomycin and fortaz empirically -Legionella antigen positive -Narrowed antibiotics to azithrymycin alone -Initially WBC peaked at over 22k and pt was encephalopathic -WBC has improved rapidly to normal and patient clinically improved nicely -Will discharge on 7 more days of azithromycin for total of 10 days of treatment  #14 thrombocytopenia Likely chemotherapy-induced. Improving since admission..  #15 prophylaxis PPI for GI prophylaxis. SCDs for DVT prophylaxis.  Procedures:  EGD 10/25  Consultations:  GI  Discharge Exam: Filed Vitals:   03/21/15 1335 03/21/15 1345 03/21/15 2040 03/22/15 0511  BP: 170/72 153/43 164/60 172/67  Pulse: 57 54 65 61  Temp: 98.4 F (36.9 C)  98.2 F (36.8 C) 98.5 F (36.9 C)  TempSrc: Oral  Oral Oral  Resp: 20  20 20   Height:      Weight:      SpO2: 100%  100% 100%    General: Awake, in nad Cardiovascular: regular, s1, s2 Respiratory: normal resp effort, no wheezing  Discharge Instructions     Medication List    STOP taking these medications        clindamycin 300 MG capsule  Commonly known as:  CLEOCIN      metroNIDAZOLE 500 MG tablet  Commonly known as:  FLAGYL     naproxen 500 MG tablet  Commonly known as:  NAPROSYN      TAKE these medications        ARIPiprazole 5 MG tablet  Commonly known as:  ABILIFY  Take 1 tablet (5 mg total) by mouth daily. For mood control     azithromycin 500 MG tablet  Commonly known as:  ZITHROMAX  Take 1 tablet (500 mg total) by mouth daily.     dexamethasone 4 MG tablet  Commonly known as:  DECADRON  Take 2 tab daily the day before chemo, and daily after chemo for 3 days     lisinopril 10 MG tablet  Commonly known as:  PRINIVIL,ZESTRIL  Take 10 mg by mouth daily.     ondansetron 4 MG tablet  Commonly known as:  ZOFRAN  Take 1 tablet (4 mg total) by mouth every 6 (six) hours as needed for nausea.     pantoprazole 40 MG tablet  Commonly known as:  PROTONIX  Take 1 tablet (40 mg total) by mouth daily.     sertraline 100 MG tablet  Commonly known as:  ZOLOFT  Take 1 tablet (100 mg total) by mouth at bedtime. For depression     sucralfate 1 GM/10ML suspension  Commonly known as:  CARAFATE  Take 10 mLs (1 g total) by mouth 4 (four) times daily -  with meals and at bedtime.     traZODone 100 MG tablet  Commonly known as:  DESYREL  Take 1 tablet (100 mg total) by mouth at bedtime. For sleep       Allergies  Allergen Reactions  . Centrum Hives   Follow-up Information    Follow up with GARBA,LAWAL, MD. Schedule an appointment as soon as possible for a visit in 1 week.   Specialty:  Internal Medicine   Why:  Hospital follow up   Contact information:   [address] [place] [id number] [phone]        The results of significant diagnostics from this hospitalization (including imaging, microbiology, ancillary and laboratory) are listed below for reference.    Significant Diagnostic Studies: Dg Chest 2 View  02/21/2015  CLINICAL DATA:  Acute onset of generalized weakness. Status post fall. Initial encounter. EXAM: CHEST  2 VIEW  COMPARISON:  Chest radiograph performed 10/21/2010 FINDINGS: The lungs are well-aerated and clear. There is no evidence of focal opacification, pleural effusion or pneumothorax. The heart is borderline normal in size. No acute osseous abnormalities are seen. Clips are seen at the right axilla. IMPRESSION: No acute cardiopulmonary process seen. Electronically Signed   By: Garald Balding M.D.   On: 02/21/2015 22:40   US Renal  03/16/2015  CLINICAL DATA:  Acute kidney injury. EXAM: RENAL / URINARY TRACT ULTRASOUND COMPLETE COMPARISON:  None. FINDINGS: Right Kidney: Length: 11.3 cm. Echogenicity within normal limits. No mass or hydronephrosis visualized. Left Kidney: Length: 12.8 cm. Echogenicity within normal limits. No mass or hydronephrosis visualized. Bladder: Appears normal for degree of bladder distention. IMPRESSION: Negative exam. Electronically Signed   By: Inge Rise M.D.   On: 03/16/2015 09:37   Dg Chest Port 1 View  03/17/2015  CLINICAL DATA:  Leukocytosis, patient smokes EXAM: PORTABLE CHEST 1 VIEW COMPARISON:  02/21/2015 FINDINGS: Left PICC line with tip all about 10 mm into the right atrium. No pneumothorax. Stable mild cardiac enlargement. Right lung clear. Mild infiltrate in the lingula. IMPRESSION: 1. Infiltrate in the lingula concerning for pneumonia 2. PICC line tip projects 10 mm into the right atrium. Optimal positioning would have the tip about 1 cm proximal at the cavoatrial junction. Electronically Signed   By: Skipper Cliche M.D.   On: 03/17/2015 10:04   Dg Esophagus W/water Sol Cm  03/17/2015  CLINICAL DATA:  59 year old female status post endoscopy earlier today demonstrating diffuse severe esophagitis. Query perforation/leak. Initial encounter. EXAM: ESOPHOGRAM/BARIUM SWALLOW TECHNIQUE: Single contrast examination was performed using initially water-soluble contrast (Omnipaque 300), ultimately thin barium. FLUOROSCOPY TIME:  Radiation Exposure Index (as provided by the  fluoroscopic device): If the device does not provide the exposure index: Fluoroscopy Time:  2 minutes 56 seconds Number of Acquired Images:  Two COMPARISON:  Portable chest radiograph 0835 hours today and earlier. FINDINGS: The patient tolerated the study well. She drank 15 mL of Omnipaque 300 which demonstrated severe mucosal irregularity in the distal third of the thoracic esophagus (series 6), but no obstruction to the flow of contrast through the esophagus and into the stomach, and no extravasation or leak. The study was then repeated with thin barium. The severe ulceration at the distal third of the esophagus persisted. Barium was slower to pass through the esophagus. Still, barium did reach the stomach with no extravasation or abnormal accumulation identified. IMPRESSION: Severe ulceration distal third thoracic esophagus with a degree  of narrowing but no evidence of perforation or leak. Electronically Signed   By: Genevie Ann M.D.   On: 03/17/2015 16:07    Microbiology: Recent Results (from the past 240 hour(s))  MRSA PCR Screening     Status: None   Collection Time: 03/15/15  9:46 PM  Result Value Ref Range Status   MRSA by PCR NEGATIVE NEGATIVE Final    Comment:        The GeneXpert MRSA Assay (FDA approved for NASAL specimens only), is one component of a comprehensive MRSA colonization surveillance program. It is not intended to diagnose MRSA infection nor to guide or monitor treatment for MRSA infections.   C difficile quick scan w PCR reflex     Status: None   Collection Time: 03/16/15  1:13 PM  Result Value Ref Range Status   C Diff antigen NEGATIVE NEGATIVE Final   C Diff toxin NEGATIVE NEGATIVE Final   C Diff interpretation Negative for toxigenic C. difficile  Final  Culture, Urine     Status: None   Collection Time: 03/17/15  7:57 AM  Result Value Ref Range Status   Specimen Description URINE, CATHETERIZED  Final   Special Requests NONE  Final   Culture   Final    9,000  COLONIES/mL INSIGNIFICANT GROWTH Performed at [facility]    Report Status 03/19/2015 FINAL  Final  Culture, blood (routine x 2)     Status: None   Collection Time: 03/17/15  8:25 AM  Result Value Ref Range Status   Specimen Description BLOOD LEFT FOOT  Final   Special Requests BOTTLES DRAWN AEROBIC AND ANAEROBIC Advance  Final   Culture   Final    NO GROWTH 5 DAYS Performed at [facility]    Report Status 03/22/2015 FINAL  Final  Culture, blood (routine x 2)     Status: None   Collection Time: 03/17/15  8:40 AM  Result Value Ref Range Status   Specimen Description BLOOD LEFT PICC  Final   Special Requests BOTTLES DRAWN AEROBIC AND ANAEROBIC 10CC  Final   Culture   Final    NO GROWTH 5 DAYS Performed at [facility]    Report Status 03/22/2015 FINAL  Final  Culture, sputum-assessment     Status: None   Collection Time: 03/17/15  3:08 PM  Result Value Ref Range Status   Specimen Description SPUTUM  Final   Special Requests Immunocompromised  Final   Sputum evaluation   Final    THIS SPECIMEN IS ACCEPTABLE. RESPIRATORY CULTURE REPORT TO FOLLOW.   Report Status 03/17/2015 FINAL  Final  Culture, respiratory (NON-Expectorated)     Status: None   Collection Time: 03/17/15  4:00 PM  Result Value Ref Range Status   Specimen Description SPUTUM  Final   Special Requests NONE  Final   Gram Stain   Final    RARE WBC PRESENT, PREDOMINANTLY PMN RARE SQUAMOUS EPITHELIAL CELLS PRESENT RARE GRAM POSITIVE COCCI IN PAIRS RARE GRAM NEGATIVE RODS Performed at [facility]    Culture   Final    NORMAL OROPHARYNGEAL FLORA Performed at [facility]    Report Status 03/20/2015 FINAL  Final     Labs: Basic Metabolic Panel:  Recent Labs Lab 03/16/15 0320 03/17/15 1517 03/18/15 0547 03/19/15 0310 03/20/15 0450 03/21/15 0500 03/22/15 0400  NA 138 144 143 139 142 142 140  K 2.9* 4.5 3.9 3.3* 3.4* 3.7 3.3*  CL 105 117* 118* 114* 117*  119* 116*   CO2 22 22 22  21* 21* 21* 21*  GLUCOSE 123* 111* 137* 103* 124* 105* 112*  BUN 61* 32* 23* 15 11 8 6   CREATININE 1.73* 1.36* 1.28* 1.35* 1.28* 1.18* 1.00  CALCIUM 6.9* 7.6* 7.8* 7.7* 8.2* 8.1* 7.7*  MG 2.5* 2.5*  --   --   --   --   --   PHOS  --   --  1.5*  --   --   --   --    Liver Function Tests:  Recent Labs Lab 03/15/15 1910 03/16/15 0320 03/18/15 0547 03/19/15 0310  AST 49* 73*  --  29  ALT 33 47  --  24  ALKPHOS 44 41  --  37*  BILITOT 1.1 1.2  --  0.6  PROT 6.8 5.8*  --  5.0*  ALBUMIN 2.8* 2.5* 2.3* 2.1*   No results for input(s): LIPASE, AMYLASE in the last 168 hours.  Recent Labs Lab 03/15/15 2131 03/17/15 1521 03/19/15 1420 03/20/15 0450  AMMONIA 25 37* 22 49*   CBC:  Recent Labs Lab 03/15/15 1910  03/18/15 0547 03/19/15 0310 03/20/15 0450 03/21/15 0500 03/22/15 0400  WBC 8.7  < > 22.8* 21.2* 21.9* 14.7* 9.6  NEUTROABS 6.7  --   --   --   --   --   --   HGB 9.3*  < > 10.1* 9.3* 10.2* 9.1* 8.4*  HCT 27.3*  < > 30.4* 28.6* 30.7* 27.2* 25.6*  MCV 82.5  < > 86.9 86.4 87.5 86.3 87.7  PLT 128*  < > 126* 135* 189 194 166  < > = values in this interval not displayed. Cardiac Enzymes: No results for input(s): CKTOTAL, CKMB, CKMBINDEX, TROPONINI in the last 168 hours. BNP: BNP (last 3 results) No results for input(s): BNP in the last 8760 hours.  ProBNP (last 3 results) No results for input(s): PROBNP in the last 8760 hours.  CBG:  Recent Labs Lab 03/18/15 0726 03/19/15 0731 03/20/15 0738 03/21/15 0737 03/22/15 0734  GLUCAP 138* 99 107* 95 103*     Signed:  CHIU, STEPHEN K  [facility] 03/22/2015, 11:55 AM

## 2015-03-23 ENCOUNTER — Other Ambulatory Visit: Payer: Self-pay | Admitting: *Deleted

## 2015-03-24 ENCOUNTER — Ambulatory Visit: Payer: Self-pay | Admitting: Hematology

## 2015-03-24 ENCOUNTER — Other Ambulatory Visit: Payer: Self-pay

## 2015-03-24 ENCOUNTER — Telehealth: Payer: Self-pay | Admitting: Hematology

## 2015-03-24 ENCOUNTER — Other Ambulatory Visit: Payer: Self-pay | Admitting: *Deleted

## 2015-03-24 NOTE — Telephone Encounter (Signed)
Spoke with patient re appointments for 11/15.

## 2015-04-01 ENCOUNTER — Ambulatory Visit: Payer: Medicare HMO

## 2015-04-01 ENCOUNTER — Ambulatory Visit: Admission: RE | Admit: 2015-04-01 | Payer: Medicare HMO | Source: Ambulatory Visit | Admitting: Radiation Oncology

## 2015-04-03 ENCOUNTER — Other Ambulatory Visit: Payer: Self-pay | Admitting: Hematology

## 2015-04-07 ENCOUNTER — Ambulatory Visit: Payer: Self-pay | Admitting: Hematology

## 2015-04-07 ENCOUNTER — Other Ambulatory Visit: Payer: Self-pay

## 2015-04-07 ENCOUNTER — Telehealth: Payer: Self-pay | Admitting: Hematology

## 2015-04-07 NOTE — Telephone Encounter (Signed)
pt cld to cx appt-stated no r/s @ this time

## 2015-04-08 ENCOUNTER — Ambulatory Visit: Admission: RE | Admit: 2015-04-08 | Payer: Medicare HMO | Source: Ambulatory Visit | Admitting: Radiation Oncology

## 2015-04-08 ENCOUNTER — Ambulatory Visit
Admission: RE | Admit: 2015-04-08 | Discharge: 2015-04-08 | Disposition: A | Discharge status: Home / Self Care | Payer: Medicare HMO | Source: Ambulatory Visit | Attending: Radiation Oncology | Admitting: Radiation Oncology

## 2015-04-08 ENCOUNTER — Telehealth: Payer: Self-pay | Admitting: *Deleted

## 2015-04-08 ENCOUNTER — Ambulatory Visit: Admission: RE | Admit: 2015-04-08 | Payer: Medicare HMO | Source: Ambulatory Visit

## 2015-04-08 NOTE — Telephone Encounter (Signed)
Called patient asked her if she was going to make her 230pm appt today with Dr. Lisbeth Renshaw?'I forgot, I'm in the process of moving to [place], will not be coming to see DR at all, asked her if she  Had mad arrangements to see Medical Dr's in [place] as yet?'I'm going to do that  When I get Moved, thanks" will infomr MD 2:46 PM

## 2015-04-08 NOTE — Telephone Encounter (Signed)
error 

## 2015-04-15 ENCOUNTER — Telehealth: Payer: Self-pay | Admitting: *Deleted

## 2015-04-15 NOTE — Telephone Encounter (Signed)
Received message from [place], [place] with  [facility] stating that pt needs a follow up appt.  Spoke with pt, and was informed that pt was discharged from the hospital 3 - 4 weeks ago.  Pt completed home PT today 04/15/15.   Pt would like a follow up appt with Dr. Burr Medico after Dec. 3 - due to transportation issue.   Informed pt that a scheduler will contact pt with appt .  Pt voiced understanding. Pt's  Phone    [phone].

## 2015-04-16 ENCOUNTER — Other Ambulatory Visit: Payer: Self-pay | Admitting: Hematology

## 2015-04-16 NOTE — Telephone Encounter (Signed)
I sent a POF today. We did send a POF to schedule her lab and MD visit on 10/30, not sure why her appointment was not scheduled.   Truitt Merle  04/16/2015

## 2015-04-17 ENCOUNTER — Other Ambulatory Visit: Payer: Self-pay | Admitting: *Deleted

## 2015-04-17 ENCOUNTER — Telehealth: Payer: Self-pay | Admitting: Hematology

## 2015-04-17 NOTE — Telephone Encounter (Signed)
per pof to sch pt appt-cld & left pt a message r/s time & date

## 2015-04-17 NOTE — Telephone Encounter (Signed)
Apparently pt cancelled her appt on 11/15.  She is r/s for early Dec.

## 2015-04-24 ENCOUNTER — Telehealth: Payer: Self-pay | Admitting: *Deleted

## 2015-04-24 NOTE — Telephone Encounter (Signed)
Called pt to see if she has moved and started xrt treatment. Pt relate she is still in  and is trying to figure out where she is going to move, "[place]". Requested pt to call me once she had moved and whom she is going to see for treatment. Pt relate she will call me once she has a radiation oncologist.

## 2015-04-27 ENCOUNTER — Telehealth: Payer: Self-pay | Admitting: Hematology

## 2015-04-27 NOTE — Telephone Encounter (Signed)
pt cld to CX appt-no r/s at this time

## 2015-04-28 ENCOUNTER — Ambulatory Visit: Payer: Self-pay | Admitting: Hematology

## 2015-04-28 ENCOUNTER — Other Ambulatory Visit: Payer: Self-pay

## 2015-05-22 ENCOUNTER — Telehealth: Payer: Self-pay | Admitting: Internal Medicine

## 2015-05-22 ENCOUNTER — Telehealth: Payer: Self-pay | Admitting: *Deleted

## 2015-05-22 NOTE — Telephone Encounter (Signed)
Called pt to discuss r/s appts for med onc and to see if she has a new rad onc in [place]. Received msg stating that the person being called is unavailable at this time. Sent msg to Dr. Ethlyn Gallery nurse to let her know we are having difficulty getting in touch with her and to see if she would be susceptible to taking calls from Dr. Ethlyn Gallery office.

## 2015-05-22 NOTE — Telephone Encounter (Signed)
Patient called for appointment. R/s 12/6 appointment fot 1/23 and gave patient new date/time.

## 2015-06-15 ENCOUNTER — Ambulatory Visit
Admission: RE | Admit: 2015-06-15 | Discharge: 2015-06-15 | Disposition: A | Discharge status: Home / Self Care | Payer: Medicare HMO | Source: Ambulatory Visit | Attending: Radiation Oncology | Admitting: Radiation Oncology

## 2015-06-15 ENCOUNTER — Other Ambulatory Visit (HOSPITAL_BASED_OUTPATIENT_CLINIC_OR_DEPARTMENT_OTHER): Payer: Medicare HMO

## 2015-06-15 ENCOUNTER — Encounter: Payer: Self-pay | Admitting: Hematology

## 2015-06-15 ENCOUNTER — Encounter: Payer: Self-pay | Admitting: Radiation Oncology

## 2015-06-15 ENCOUNTER — Ambulatory Visit (HOSPITAL_BASED_OUTPATIENT_CLINIC_OR_DEPARTMENT_OTHER): Payer: Medicare HMO | Admitting: Hematology

## 2015-06-15 VITALS — BP 119/76 | HR 59 | Temp 98.1°F | Resp 18 | Ht 62.0 in | Wt 171.1 lb

## 2015-06-15 VITALS — BP 119/76 | HR 59 | Temp 98.1°F | Resp 18 | Ht 62.0 in | Wt 171.0 lb

## 2015-06-15 DIAGNOSIS — C50411 Malignant neoplasm of upper-outer quadrant of right female breast: Secondary | ICD-10-CM | POA: Diagnosis not present

## 2015-06-15 DIAGNOSIS — T451X5A Adverse effect of antineoplastic and immunosuppressive drugs, initial encounter: Secondary | ICD-10-CM

## 2015-06-15 DIAGNOSIS — F39 Unspecified mood [affective] disorder: Secondary | ICD-10-CM

## 2015-06-15 DIAGNOSIS — Z170 Estrogen receptor positive status [ER+]: Secondary | ICD-10-CM | POA: Diagnosis not present

## 2015-06-15 DIAGNOSIS — I10 Essential (primary) hypertension: Secondary | ICD-10-CM | POA: Diagnosis not present

## 2015-06-15 DIAGNOSIS — Z853 Personal history of malignant neoplasm of breast: Secondary | ICD-10-CM

## 2015-06-15 DIAGNOSIS — F1910 Other psychoactive substance abuse, uncomplicated: Secondary | ICD-10-CM

## 2015-06-15 DIAGNOSIS — K7460 Unspecified cirrhosis of liver: Secondary | ICD-10-CM | POA: Diagnosis not present

## 2015-06-15 DIAGNOSIS — G620 Drug-induced polyneuropathy: Secondary | ICD-10-CM

## 2015-06-15 DIAGNOSIS — C50911 Malignant neoplasm of unspecified site of right female breast: Secondary | ICD-10-CM

## 2015-06-15 DIAGNOSIS — Z720 Tobacco use: Secondary | ICD-10-CM

## 2015-06-15 HISTORY — DX: Adverse effect of antineoplastic and immunosuppressive drugs, initial encounter: G62.0

## 2015-06-15 HISTORY — DX: Drug-induced polyneuropathy: T45.1X5A

## 2015-06-15 LAB — COMPREHENSIVE METABOLIC PANEL
ALT: 38 U/L (ref 0–55)
ANION GAP: 8 meq/L (ref 3–11)
AST: 33 U/L (ref 5–34)
Albumin: 3.9 g/dL (ref 3.5–5.0)
Alkaline Phosphatase: 96 U/L (ref 40–150)
BUN: 19.9 mg/dL (ref 7.0–26.0)
CO2: 20 meq/L — AB (ref 22–29)
CREATININE: 0.9 mg/dL (ref 0.6–1.1)
Calcium: 9.5 mg/dL (ref 8.4–10.4)
Chloride: 110 mEq/L — ABNORMAL HIGH (ref 98–109)
EGFR: 84 mL/min/{1.73_m2} — AB (ref >90)
Glucose: 101 mg/dl (ref 70–140)
Potassium: 4.7 mEq/L (ref 3.5–5.1)
Sodium: 138 mEq/L (ref 136–145)
TOTAL PROTEIN: 7.7 g/dL (ref 6.4–8.3)

## 2015-06-15 LAB — CBC & DIFF AND RETIC
BASO%: 0.7 % (ref 0.0–2.0)
Basophils Absolute: 0 10*3/uL (ref 0.0–0.1)
EOS%: 6.7 % (ref 0.0–7.0)
Eosinophils Absolute: 0.4 10*3/uL (ref 0.0–0.5)
HCT: 36.2 % (ref 34.8–46.6)
HGB: 12.2 g/dL (ref 11.6–15.9)
Immature Retic Fract: 1.3 % — ABNORMAL LOW (ref 1.60–10.00)
LYMPH%: 32.9 % (ref 14.0–49.7)
MCH: 28.6 pg (ref 25.1–34.0)
MCHC: 33.7 g/dL (ref 31.5–36.0)
MCV: 84.8 fL (ref 79.5–101.0)
MONO#: 0.3 10*3/uL (ref 0.1–0.9)
MONO%: 5.2 % (ref 0.0–14.0)
NEUT#: 3 10*3/uL (ref 1.5–6.5)
NEUT%: 54.5 % (ref 38.4–76.8)
PLATELETS: 156 10*3/uL (ref 145–400)
RBC: 4.27 10*6/uL (ref 3.70–5.45)
RDW: 13.1 % (ref 11.2–14.5)
Retic %: 0.84 % (ref 0.70–2.10)
Retic Ct Abs: 35.87 10*3/uL (ref 33.70–90.70)
WBC: 5.5 10*3/uL (ref 3.9–10.3)
lymph#: 1.8 10*3/uL (ref 0.9–3.3)

## 2015-06-15 MED ORDER — GABAPENTIN 100 MG PO CAPS: 100.0000 mg | ORAL_CAPSULE | Freq: Three times a day (TID) | ORAL | Status: DC

## 2015-06-15 NOTE — Progress Notes (Signed)
[place]  [phone] [phone]  Clinic Follow Up Note   Patient Care Team: Elwyn Reach, MD as PCP - General (Internal Medicine) Autumn Messing III, MD as Consulting Physician (General Surgery) 06/15/2015  CHIEF COMPLAINTS:  Follow-up of breast cancer  Oncology History   Breast cancer of upper-outer quadrant of right female breast s/p Right MRM 10/30/14   Staging form: Breast, AJCC 7th Edition     Pathologic: Stage IIA (T1b, N1a, cM0) - Unsigned       Breast cancer of upper-outer quadrant of right female breast s/p Right MRM 10/30/14   06/03/2004 Cancer Diagnosis Left breast DCIS, status post mastectomy   06/06/2014 Imaging A mammogram and ultrasound showed a irregular 0.7 cm mass at the 12:30 clock position of right breast. Axillary nodes were negative.   08/12/2014 Receptors her2 ER 100% positive, PR 88% positive, HER-2 negative. Ki-67 19%.   08/12/2014 Initial Biopsy Right breast 12:30 o'clock biopsy showed invasive ductal carcinoma, grade 2.   10/30/2014 Surgery Right breast mastectomy and axillary node dissection, surgical margins were negative.    10/30/2014 Initial Diagnosis Breast cancer of upper-outer quadrant of right female breast s/p Right MRM 10/30/14   10/30/2014 Pathology Results Right breast radical mastectomy showed invasive ductal carcinoma, grade 1, tumor measuring 1.0 cm, margins were negative lymphovascular invasion (+), 1 sentinel lymph nodes positive, 15 additional axillary nodes negative.  (+) DCIS    12/30/2014 - 03/03/2015 Chemotherapy Docetaxel 75 mg/m, Cytoxan 600 mg/m, every 3 weeks, s/p 4 cycles    03/15/2015 - 03/22/2015 Hospital Admission Patient was admitted for GI bleeding, required blood transfusion, EDC showed esophagitis.     HISTORY OF PRESENTING ILLNESS:  Yazmina Pareja Robb 60 y.o. female is here because of recently diagnosed right breast cancer.   This was discovered by screening mammogram in January 2016. She did not  have palpable breast mass or any other symptoms. Biopsy on 08/12/2014 showed invasive ductal carcinoma, ER/PR positive, HER-2 negative. She underwent right breast mastectomy by Dr. Marcello Moores on 10/28/2014. Marland Kitchen   She has had some pain underneath her R armpit with some swelling. She reports that her "drain fell out" and was seen by Dr. Marlou Starks on 7/15 at which time a seroma was drained. Otherwise, she denies any problems with appetite, energy, pain. She does report having lost weight 195 lbs to 186 lbs though over an unspecified time interval. She is now menopausal with last menstrual period being sometime in her late 14s.  Per records obtained from [facility], she underwent left simple mastectomy on at [facility] on 07/16/04 after detection of multiple abnormalities in the left breast on mammography and was diagnosed with DCIS. On 05/27/05, she underwent placement of left tissue expander but declined additional reconstructive surgery. On 06/15/06, she was seen by Dr. Sophronia Simas for possible adjuvant tamoxifen and was recommended to undergo close surveillance. 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   She follows with Dr. Gala Romney for PCP care and was last seen by them prior to the surgery. She is currently undergoing substance abuse rehab. She reports that 1 pack of cigarettes lasts her a week and has not used cocaine for a month ago. She does report using heroin in the past. She also has history of bipolar for which she is on Abilify. She is widowed, has no children. She lives with a roommate who she has known for the last 10 years.  CURRENT  THERAPY: observation   INTERIM HISTORY Prisma returns for follow up. She completed the last cycle of chemotherapy on 03/03/2015. She was admitted to hospital 2 weeks later for GI bleeding. She received a blood transfusion, and underwent upper endoscopy which showed esophagitis. She was discharged back to home after 1  week hospital stay. She lost follow-up with me since then, and returned to clinic to see me and radiation oncologist Dr. Lisbeth Renshaw today. She states she has mild numbness and tingling on the fingers and toes, she lost a few toenails, her fingernails are not back to normal yet. Her appetite and energy level has recovered well, no other new complaints.  MEDICAL HISTORY:  Past Medical History  Diagnosis Date  . Hypertension   . Shortness of breath dyspnea     with walking  . Pneumonia   . Anxiety   . Depression   . Bipolar disorder ([place])   . GERD (gastroesophageal reflux disease)   . Arthritis   . Cancer [facility]) 2002    left breast cancer tx in [place], right breast cancer 08/2014  . ETOH abuse     as of 10/21/14 none for a week  . Cocaine abuse     last use 10/13/14  . Breast cancer ([place]) 09/10/14    right breast,hx left breast ca,2002 [place]  . Allergy   . Tobacco abuse   . HCV (hepatitis C virus)     SURGICAL HISTORY: Past Surgical History  Procedure Laterality Date  . Mastectomy      and Implant  . Mandible fracture surgery    . Breast surgery  2002    left mastectomy with implant  . Tonsillectomy    . Mastectomy w/ sentinel node biopsy Right 10/30/2014  . Simple mastectomy with axillary sentinel node biopsy Right 10/30/2014    Procedure: RIGHT MASTECTOMY WITH SENTINEL NODE MAPPING;  Surgeon: Autumn Messing III, MD;  Location: [place];  Service: General;  Laterality: Right;  . Esophagogastroduodenoscopy (egd) with propofol N/A 03/17/2015    Procedure: ESOPHAGOGASTRODUODENOSCOPY (EGD) WITH PROPOFOL;  Surgeon: Gatha Mayer, MD;  Location: [facility];  Service: Endoscopy;  Laterality: N/A;    SOCIAL HISTORY: Social History   Social History  . Marital Status: Widowed    Spouse Name: N/A  . Number of Children: N/A  . Years of Education: N/A   Occupational History  . Not on file.   Social History Main Topics  . Smoking status: Current Every Day Smoker -- 0.25  packs/day    Types: Cigarettes  . Smokeless tobacco: Never Used  . Alcohol Use: Yes     Comment: was a heavy drinker (daily) none for a week (as of 10/21/14.  . Drug Use: Yes    Special: Cocaine, Marijuana     Comment: last use of cocaine ?10/13/14, last use of marijuana 3 weeks ago (as of 10/21/14)  . Sexual Activity: Not Currently    Birth Control/ Protection: Abstinence   Other Topics Concern  . Not on file   Social History Narrative    FAMILY HISTORY: Family History  Problem Relation Age of Onset  . Breast cancer Sister 46  . Mental illness Mother   . HIV/AIDS Sister     Deceased    ALLERGIES:  is allergic to centrum.  MEDICATIONS:  Current Outpatient Prescriptions  Medication Sig Dispense Refill  . ARIPiprazole (ABILIFY) 5 MG tablet Take 1 tablet (5 mg total) by mouth daily. For mood control 30 tablet 0  .  lisinopril (PRINIVIL,ZESTRIL) 10 MG tablet Take 10 mg by mouth daily.    . pantoprazole (PROTONIX) 40 MG tablet Take 1 tablet (40 mg total) by mouth daily. 60 tablet 0  . sertraline (ZOLOFT) 100 MG tablet Take 1 tablet (100 mg total) by mouth at bedtime. For depression 30 tablet 0  . sucralfate (CARAFATE) 1 GM/10ML suspension Take 10 mLs (1 g total) by mouth 4 (four) times daily -  with meals and at bedtime. (Patient not taking: Reported on 06/15/2015) 420 mL 0  . traZODone (DESYREL) 100 MG tablet Take 1 tablet (100 mg total) by mouth at bedtime. For sleep (Patient taking differently: Take 100 mg by mouth daily. For sleep) 30 tablet 0  . dexamethasone (DECADRON) 4 MG tablet Take 2 tab daily the day before chemo, and daily after chemo for 3 days (Patient not taking: Reported on 06/15/2015) 16 tablet 1  . gabapentin (NEURONTIN) 100 MG capsule Take 1 capsule (100 mg total) by mouth 3 (three) times daily. (Patient not taking: Reported on 06/15/2015) 90 capsule 2  . ondansetron (ZOFRAN) 8 MG tablet TAKE ONE TABLET   (8 MG TOTAL) BY MOUTH   TWICE A DAY   START THE DAY AFTER CHEMO  FOR THREE   DAYS. THEN TAKE AS NEEDED   FOR NAUSEA OR VOMI (Patient not taking: Reported on 06/15/2015) 20 tablet 3   No current facility-administered medications for this visit.    REVIEW OF SYSTEMS:   Constitutional: Denies fevers, chills or abnormal night sweats Eyes: Denies blurriness of vision, double vision or watery eyes Ears, nose, mouth, throat, and face: Denies mucositis or sore throat Respiratory: Denies cough, dyspnea or wheezes Cardiovascular: Denies palpitation, chest discomfort or lower extremity swelling Gastrointestinal:  Denies nausea, heartburn or change in bowel habits Skin: Denies abnormal skin rashes Lymphatics: Denies new lymphadenopathy or easy bruising Neurological:Denies numbness, tingling or new weaknesses Behavioral/Psych: Mood is stable, no new changes  All other systems were reviewed with the patient and are negative.  PHYSICAL EXAMINATION: ECOG PERFORMANCE STATUS: 0 - Asymptomatic  Filed Vitals:   06/15/15 1355  BP: 119/76  Pulse: 59  Temp: 98.1 F (36.7 C)  Resp: 18   Filed Weights   06/15/15 1355  Weight: 171 lb 1.6 oz (77.61 kg)    GENERAL:alert, no distress and comfortable SKIN: skin color, texture, turgor are normal, no rashes or significant lesions, except multiple large healing skim rash/ulcers and pigmentation/color change on b/l front arms, L>R. She has a healing skin lesion on her scalp  EYES: normal, conjunctiva are pink and non-injected, sclera clear OROPHARYNX:no exudate, no erythema and lips, buccal mucosa, and tongue normal  NECK: supple, thyroid normal size, non-tender, without nodularity LYMPH:  no palpable lymphadenopathy in the cervical, axillary or inguinal BREAST: s/p bilateral mastectomy, s/p left implant placement. exam of the left breast, right chest wall and bilateral axilla revealed no palpable mass or adenopathy. LUNGS: clear to auscultation and percussion with normal breathing effort HEART: regular rate & rhythm and  no murmurs and no lower extremity edema ABDOMEN:abdomen soft, non-tender and normal bowel sounds Musculoskeletal:no cyanosis of digits and no clubbing  PSYCH: alert & oriented x 3 NEURO: no focal motor/sensory deficits  LABORATORY DATA:  I have reviewed the data as listed CBC Latest Ref Rng 06/15/2015 03/22/2015 03/21/2015  WBC 3.9 - 10.3 10e3/uL 5.5 9.6 14.7(H)  Hemoglobin 11.6 - 15.9 g/dL 12.2 8.4(L) 9.1(L)  Hematocrit 34.8 - 46.6 % 36.2 25.6(L) 27.2(L)  Platelets 145 - 400 10e3/uL  156 166 194    CMP Latest Ref Rng 06/15/2015 03/22/2015 03/21/2015  Glucose 70 - 140 mg/dl 101 112(H) 105(H)  BUN 7.0 - 26.0 mg/dL 19._0 Creatinine 0.6 - 1.1 mg/dL 0.9 1.00 1.18(H)  Sodium 136 - 145 mEq/L 138 140 142  Potassium 3.5 - 5.1 mEq/L 4.7 3.3(L) 3.7  Chloride 101 - 111 mmol/L - 116(H) 119(H)  CO2 22 - 29 mEq/L 20(L) 21(L) 21(L)  Calcium 8.4 - 10.4 mg/dL 9.5 7.7(L) 8.1(L)  Total Protein 6.4 - 8.3 g/dL 7.7 - -  Total Bilirubin 0.20 - 1.20 mg/dL <0.30 - -  Alkaline Phos 40 - 150 U/L 96 - -  AST 5 - 34 U/L 33 - -  ALT 0 - 55 U/L 38 - -    PATHOLOGY REPORT 10/30/2014 ADDITIONAL INFORMATION: 2. FLUORESCENCE IN-SITU HYBRIDIZATION  Results: HER2 - NEGATIVE RATIO OF HER2/CEP17 SIGNALS 1.62 AVERAGE HER2 COPY NUMBER PER CELL 2.10 Reference Range: NEGATIVE HER2/CEP17 Ratio <2.0 and average HER2 copy number <4.0 EQUIVOCAL HER2/CEP17 Ratio <2.0 and average HER2 copy number 4.0 and <6.0 POSITIVE HER2/CEP17 Ratio >=2.0 or <2.0 and average HER2 copy number >=6.0 Mali RUND DO Pathologist, Electronic Signature ( Signed 11/26/2014) 2. her2 This is NOT signed out FINAL DIAGNOSIS Diagnosis 1. Lymph node, sentinel, biopsy, Right #1 - ONE LYMPH NODE, POSITIVE FOR METASTATIC MAMMARY CARCINOMA (1/1). - INTRANODAL TUMOR DEPOSIT IS 1.2 CM - POSITIVE FOR EXTRACAPSULAR TUMOR EXTENSION. 2. Breast, radical mastectomy (including lymph nodes), Right and axillary contents - INVASIVE DUCTAL CARCINOMA, SEE  COMMENT. - POSITIVE FOR LYMPH VASCULAR INVASION. - DUCTAL CARCINOMA IN SITU WITH NECROSIS AND CALCIFICATIONS. - FIFTEEN LYMPH NODE S, NEGATIVE FOR TUMOR (0/15). - PREVIOUS BIOPSY SITE. - SEE TUMOR SYNOPTIC TEMPLATE BELOW  Microscopic Comment 2. BREAST, INVASIVE TUMOR, WITH LYMPH NODES PRESENT Specimen, including laterality and lymph node sampling (sentinel, non-sentinel): Right breast with sentinel lymph node sampling Procedure: Radical mastectomy Histologic type: Ductal Grade: I of III Tubule formation: 2 Nuclear pleomorphism: 2 Mitotic: 1 Tumor size (gross measurement): 1.0 cm Margins: Invasive, distance to closest margin: 1.8 cm (posterior) In-situ, distance to closest margin: 1.8 cm (posterior) If margin positive, focally or broadly: N/A Lymphovascular invasion: Present Ductal carcinoma in situ: Present Grade: 2 of 3 Extensive intraductal component: Absent Lobular neoplasia: Absent Tumor focality: Unifocal Treatment effect: None If present, treatment effect in breast tissue, lymph nodes or both: N/A Extent of tumor: Skin: N/A Nipple: N/A Skeletal muscle: N/A Lymph nodes: Examined: 1 Sentinel 15 Non-sentinel 16 Total Lymph nodes with metastasis: 1 Isolated tumor cells (< 0.2 mm): 0 Micrometastasis: (> 0.2 mm and < 2.0 mm): 0 Macrometastasis: (> 2.0 mm): x 1 Extracapsular extension: Present Breast prognostic profile: Estrogen receptor: Not repeated, previous study demonstrated 100% positivity ([id number]) Progesterone receptor: Not repeated, previous study demonstrated 88% positivity ([id number]) Her 2 neu: Repeated, previous study demonstrated no amplification ([id number]) Ki-67: Not repeated, previous study demonstrated 19% proliferation rate ([id number]) Non-neoplastic breast: Previous biopsy site, fibrocystic change, columnar cell change, sclerosing adenosis, benign adenosis and calcifications TNM: p T1b, pN1a, pMX   RADIOGRAPHIC STUDIES: I have personally  reviewed the radiological images as listed and agreed with the findings in the report. No results found.  ASSESSMENT & PLAN:  Ms. Neyens is a 60 year old female with h/o left DCIS s/p mastectomy who presents with R invasive ductal carcinoma s/p mastectomy  1. Stage IIa, pT1bpN1a,M0, right invasive ductal carcinoma s/p mastectomy, ER+/PR+/HER- , G1 -Discussed with the patient the results of her surgical path  and her cancer staging -The natural history of breast cancer were reviewed with her, and moderate risk of cancer recurrence after her surgery, giving the note positive disease. -She has now completed adjuvant chemotherapy. -She is scheduled to see radiation oncologist Dr. Lisbeth Renshaw today. Her radiation has been delayed due to her hospitalization in October 2016 and loss of follow-up -Giving her ER and PR positive tumor, I recommend antiestrogen therapy after she completes breast irradiation. -She would like to see a plastic surgeon for right implant placement   2. Genetics -Giving her asthma history of breast cancer twice, and family history (sister) of breast cancer, I recommended genetic counseling but she declined.   -She does not have children, but does have sisters.  2. History of cirrhosis and HCV infection -No imaging or additional workup found in our system though noted to have undergone treatment for HCV in 1999 at [facility] per note by Aurora Mask  [06/02/09] -Most recent LFTs reassuring -She still has very high HCV load, will refer her to liver clinic or ID   3. Polysubstance abuse: Recently hospitalized in February 2016 for substance-induced mood disorder. -Continue to encourage patient to continue with substance rehab and avoid illicit substances -She agrees to quit alcohol and illicit drug (cocane) completely when she is on chemo, she will working on smoking cessation also -Her urine test was still positive for cocaine on August 9, we discussed cocaine cessation again, and  she agrees to stop it completely  4. Hypertension:  -Continue follow-up with her primary care physician. -She has been slightly on high  5. Mood disorder: She does not follow with a psychiatrist and reports her PCP prescribes all of her psychiatric medications.  -Defer to PCP   Plan --she will see radd/onc today  - return to clinic in 4 weeks for follow up  -Referral to plastics surgeon   Truitt Merle  06/15/2015

## 2015-06-15 NOTE — Progress Notes (Addendum)
Location of Breast Cancer: Right Breast  Upper Outer Quadrant   Histology per Pathology Report: Diagnosis 08/12/14: Breast, right, needle core biopsy, 12:30 o'clock - INVASIVE DUCTAL CARCINOMA, SEE COMMENT. - DUCTAL CARCINOMA IN SITU WITH NECROSIS. - CALCIFICATIONS PRESENT.   Receptor Status: ER(100% +), PR (88% +), Her2-neu ( neg  KI-67 19%)  Did patient present with symptoms (if so, please note symptoms) or was this found on screening mammography?: screening mammogram in January 2016, right arm pain under axilla,   Past/Anticipated interventions by surgeon, if VUY:EBXIDHWYS 10/30/14: Dr. Johnnette Barrios 1. Lymph node, sentinel, biopsy, Right #1 - ONE LYMPH NODE, POSITIVE FOR METASTATIC MAMMARY CARCINOMA (1/1).- INTRANODAL TUMOR DEPOSIT IS 1.2 CM- POSITIVE FOR EXTRACAPSULAR TUMOR EXTENSION. 2. Breast, radical mastectomy (including lymph nodes), Right and axillary contents - INVASIVE DUCTAL CARCINOMA, SEE COMMENT.- POSITIVE FOR LYMPH VASCULAR INVASION.- DUCTAL CARCINOMA IN SITU WITH NECROSIS AND CALCIFICATIONS. 1 of 4FINAL for BORA, BOST ([id number])- FIFTEEN LYMPH NODE S, NEGATIVE FOR TUMOR (0/15).- PREVIOUS BIOPSY SITE.   Right Breast bx papilloma, with Lumpectomy  10/26/2010 Dr. Eddie Dibbles Toth,MD  Left breast  Simple mastectomy [facility] 07/16/04,   Past/Anticipated interventions by medical oncology, if any: Chemotherapy :Dr. Burr Medico, last seen 06/15/15 ,neurotin rx today to start taking for neuropathy in hands and feet  12/30/2014 -  Chemotherapy Docetaxel 75 mg/m, Cytoxan 600 mg/m, every 3 weeks.         Lymphedema issues, if any:  no  Pain issues, if any: Currently undergoing substance abuse rehab , 1pack cigarettes lasts 1 week, and hasn't used cocaine  Or marijuana  As of 10/21/14   SAFETY ISSUES:  Prior radiation?  NO  Pacemaker/ICD? NO  Possible current pregnancy? NO  Is the patient on methotrexate?  NO  Current Complaints / other details: Widowed, no  children,   Bipolar/depression/anxiety, Hx Left Breast Cancer mastectomy and implant  tx in [place] 2002,Left simple mastectomy  07/16/04, /, 1/5/7 left tissue expander ,  Right breast cancer 08/2014, Hx  ETOH (last use 10/21/14) and Cocaine abuse(last use 01/2015) Marijuana use  (last use 10/21/14) Hx heroin in the past, smokes 1 pack cigarettes weekly, sister breast cancer   Allergies:Centrum=Hives  Rebecca Eaton, RN 06/15/2015,8:47 AM  Diagnosis 03/17/15: Esophagus, biopsy - MARKED ACUTE ESOPHAGITIS WITH ULCERATION. - NO VIRAL CYTOPATHIC EFFECT OR FUNGAL ORGANISMS. - NO INTESTINAL METAPLASIA, DYSPLASIA OR MALIGNANCY. .Diagnosis 03/17/15: Dr. Ronney Lion ESOPHAGEAL BRUSHING(SPECIMEN 1 OF 1 COLLECTED 03/17/15): BENIGN REACTIVE/REPARATIVE CHANGES. ACUTE INFLAMMATION  BP 119/76 mmHg  Pulse 59  Temp(Src) 98.1 F (36.7 C) (Oral)  Resp 18  Ht _0  (1.575 m)  Wt 171 lb (77.565 kg)  BMI 31.27 kg/m2  Wt Readings from Last 3 Encounters:  06/15/15 171 lb (77.565 kg)  06/15/15 171 lb 1.6 oz (77.61 kg)  03/18/15 175 lb 7.8 oz (79.6 kg)

## 2015-06-15 NOTE — Progress Notes (Signed)
Radiation Oncology         [phone] ________________________________  Name: Marissa Brooks MRN: [id number]  Date: 06/15/2015  DOB: 1956/01/22  IO:XBDZH,GDJME, MD  Truitt Merle, MD     REFERRING PHYSICIAN: Truitt Merle, MD  DIAGNOSIS: The primary encounter diagnosis was NEOPLASM, MALIGNANT, BREAST, HX OF. A diagnosis of Breast cancer of upper-outer quadrant of right female breast s/p Right MRM 10/30/14 was also pertinent to this visit.    Oncology History   Breast cancer of upper-outer quadrant of right female breast s/p Right MRM 10/30/14   Staging form: Breast, AJCC 7th Edition     Pathologic: Stage IIA (T1b, N1a, cM0) - Unsigned       Breast cancer of upper-outer quadrant of right female breast s/p Right MRM 10/30/14   06/03/2004 Cancer Diagnosis Left breast DCIS, status post mastectomy   06/06/2014 Imaging A mammogram and ultrasound showed a irregular 0.7 cm mass at the 12:30 clock position of right breast. Axillary nodes were negative.   08/12/2014 Receptors her2 ER 100% positive, PR 88% positive, HER-2 negative. Ki-67 19%.   08/12/2014 Initial Biopsy Right breast 12:30 o'clock biopsy showed invasive ductal carcinoma, grade 2.   10/30/2014 Surgery Right breast mastectomy and axillary node dissection, surgical margins were negative.    10/30/2014 Initial Diagnosis Breast cancer of upper-outer quadrant of right female breast s/p Right MRM 10/30/14   10/30/2014 Pathology Results Right breast radical mastectomy showed invasive ductal carcinoma, grade 1, tumor measuring 1.0 cm, margins were negative lymphovascular invasion (+), 1 sentinel lymph nodes positive, 15 additional axillary nodes negative.  (+) DCIS    12/30/2014 - 03/24/2015 Chemotherapy Docetaxel 75 mg/m, Cytoxan 600 mg/m, every 3 weeks, s/p 4 cycles    HISTORY OF PRESENT ILLNESS::Marissa Brooks is a 60 y.o. female who is seen for an initial consultation visit regarding the patient's diagnosis of right breast cancer. Per records obtained from  [facility], she underwent left simple mastectomy on at [facility] on 07/16/04 after detection of multiple abnormalities in the left breast on mammography and was diagnosed with DCIS. On 05/27/05, she underwent placement of left tissue expander but declined additional reconstructive surgery. On 06/15/06, she was seen by Dr. Sophronia Simas for possible adjuvant tamoxifen and was recommended to undergo close surveillance. In June 2012, she underwent right breast lumpectomy due to intraductal papilloma changes.  Screening mammogram in January 2016 found a 0.7 cm mass at the 12 o'clock position of the right breast. She did not have palpable breast mass or other symptoms. Biopsy on 08/12/2014 showed invasive ductal carcinoma, ER/PR positive, HER-2 negative. She underwent right breast mastectomy with axillary node dissection by Dr. Marlou Starks on 10/28/2014. There were no surgical complications, surgical margins were negative, 1 sentinel node was positive with extracapsular extension.    She follows with Dr. Gala Romney for PCP care and was last seen by them prior to the surgery. She is currently undergoing substance abuse rehab. She reports that 1 pack of cigarettes lasts her a week and has not used cocaine for one month. She does report using heroin in the past. She also has history of bipolar for which she is on Abilify. She is widowed, has no children. She lives with a roommate who she has known for the last 10 years.   The patient has completed chemotherapy and last met with Dr.Feng on 06/15/15. The patient was originally supposed meet with me in October 2016, however due to transportation issues was unable to.   Patient  lives in [place].   PREVIOUS RADIATION THERAPY: No   PAST MEDICAL HISTORY:  has a past medical history of Hypertension; Shortness of breath dyspnea; Pneumonia; Anxiety; Depression; Bipolar disorder ([address]); GERD (gastroesophageal reflux disease); Arthritis; Cancer ([place]) (2002); ETOH abuse; Cocaine  abuse; Breast cancer ([place]) (09/10/14); Allergy; Tobacco abuse; and HCV (hepatitis C virus).     PAST SURGICAL HISTORY: Past Surgical History  Procedure Laterality Date  . Mastectomy      and Implant  . Mandible fracture surgery    . Breast surgery  2002    left mastectomy with implant  . Tonsillectomy    . Mastectomy w/ sentinel node biopsy Right 10/30/2014  . Simple mastectomy with axillary sentinel node biopsy Right 10/30/2014    Procedure: RIGHT MASTECTOMY WITH SENTINEL NODE MAPPING;  Surgeon: Autumn Messing III, MD;  Location: [place];  Service: General;  Laterality: Right;  . Esophagogastroduodenoscopy (egd) with propofol N/A 03/17/2015    Procedure: ESOPHAGOGASTRODUODENOSCOPY (EGD) WITH PROPOFOL;  Surgeon: Gatha Mayer, MD;  Location: [facility];  Service: Endoscopy;  Laterality: N/A;     FAMILY HISTORY: family history includes Breast cancer (age of onset: 37) in her sister; HIV/AIDS in her sister; Mental illness in her mother.   SOCIAL HISTORY:  reports that she has been smoking Cigarettes.  She has been smoking about 0.25 packs per day. She has never used smokeless tobacco. She reports that she drinks alcohol. She reports that she uses illicit drugs (Cocaine and Marijuana).   ALLERGIES: Centrum   MEDICATIONS:  Current Outpatient Prescriptions  Medication Sig Dispense Refill  . ARIPiprazole (ABILIFY) 5 MG tablet Take 1 tablet (5 mg total) by mouth daily. For mood control 30 tablet 0  . lisinopril (PRINIVIL,ZESTRIL) 10 MG tablet Take 10 mg by mouth daily.    . pantoprazole (PROTONIX) 40 MG tablet Take 1 tablet (40 mg total) by mouth daily. 60 tablet 0  . sertraline (ZOLOFT) 100 MG tablet Take 1 tablet (100 mg total) by mouth at bedtime. For depression 30 tablet 0  . traZODone (DESYREL) 100 MG tablet Take 1 tablet (100 mg total) by mouth at bedtime. For sleep (Patient taking differently: Take 100 mg by mouth daily. For sleep) 30 tablet 0  . dexamethasone (DECADRON) 4 MG tablet  Take 2 tab daily the day before chemo, and daily after chemo for 3 days (Patient not taking: Reported on 06/15/2015) 16 tablet 1  . gabapentin (NEURONTIN) 100 MG capsule Take 1 capsule (100 mg total) by mouth 3 (three) times daily. (Patient not taking: Reported on 06/15/2015) 90 capsule 2  . ondansetron (ZOFRAN) 8 MG tablet TAKE ONE TABLET   (8 MG TOTAL) BY MOUTH   TWICE A DAY   START THE DAY AFTER CHEMO FOR THREE   DAYS. THEN TAKE AS NEEDED   FOR NAUSEA OR VOMI (Patient not taking: Reported on 06/15/2015) 20 tablet 3  . sucralfate (CARAFATE) 1 GM/10ML suspension Take 10 mLs (1 g total) by mouth 4 (four) times daily -  with meals and at bedtime. (Patient not taking: Reported on 06/15/2015) 420 mL 0   No current facility-administered medications for this encounter.     REVIEW OF SYSTEMS:  On review of systems, the patient reports she is doing well overall since her surgery. She denies any pain at her surgical site. She has been noticing right upper extremity lymphedema and has not had this evaluated yet. She states this comes and goes since her surgery but usually some part of her  arm is swollen. She denies any difficulty since completing chemotherapy and reports that she is gaining strength and energy since completing this in October 2016. She does have some joint pain and uses a cane to mobilize. She denies any new joint pains and states that she has been having pains in her foot for many weeks. She is planning to discuss this with her PCP. She denies chest pain, shortness of breath, fevers or chills. Complete review of systems is obtained and is otherwise negative.     PHYSICAL EXAM:  height is _0  (1.575 m) and weight is 171 lb (77.565 kg). Her oral temperature is 98.1 F (36.7 C). Her blood pressure is 119/76 and her pulse is 59. Her respiration is 18.   Pain scale 0/10  In general this is a well appearing African American female in no acute distress. She is alert and oriented x4 ann appropriate  throughout the visit. Cardiovascular exam reveals a regular rate and rhythm. Chest is clear to auscultation bilaterally. Lymphatic review is performed and does not reveal palpable adenopathy in the supraclavicular, cervical, or axillary chains bilaterally. Skin is intact and is somewhat dry. No excoriations are noted. Chest wall is evaluated and post surgical changes are noted of the right breast consistent with prior mastectomy. No palpable abnormalities are noted. Along the left chest, there is a mastectomy scar with induration at the midline of the left breast, and a tissue expander present. The contours of this are somewhat contracted. No skin abnormalities are otherwise noted. The abdomen is soft, nontender, nondistended. Lower extremities are negative for edema, though there is trace edema of the right upper extremity.  ECOG = 1  0 - Asymptomatic (Fully active, able to carry on all predisease activities without restriction)  1 - Symptomatic but completely ambulatory (Restricted in physically strenuous activity but ambulatory and able to carry out work of a light or sedentary nature. For example, light housework, office work)  2 - Symptomatic, <50% in bed during the day (Ambulatory and capable of all self care but unable to carry out any work activities. Up and about more than 50% of waking hours)  3 - Symptomatic, >50% in bed, but not bedbound (Capable of only limited self-care, confined to bed or chair 50% or more of waking hours)  4 - Bedbound (Completely disabled. Cannot carry on any self-care. Totally confined to bed or chair)  5 - Death   Eustace Pen MM, Creech RH, Tormey DC, et al. [id number]). "Toxicity and response criteria of the [facility] Group". [place] Oncol. 5 (6): 649-55  _   LABORATORY DATA:  Lab Results  Component Value Date   WBC 5.5 06/15/2015   HGB 12.2 06/15/2015   HCT 36.2 06/15/2015   MCV 84.8 06/15/2015   PLT 156 06/15/2015   Lab Results    Component Value Date   NA 138 06/15/2015   K 4.7 06/15/2015   CL 116* 03/22/2015   CO2 20* 06/15/2015   Lab Results  Component Value Date   ALT 38 06/15/2015   AST 33 06/15/2015   ALKPHOS 96 06/15/2015   BILITOT <0.30 06/15/2015     RADIOGRAPHY: No results found.     IMPRESSION: The patient has been diagnosed with stage IIa, pT1bpN1a,M0, right invasive ductal carcinoma s/p mastectomy. The patient has successfully completed surgery and chemotherapy. The patient would be a good candidate to receive radiation treatment.   PLAN: Dr. Lisbeth Renshaw discusses the role of radiotherapy to the chest  wall and recommends 6 1/2 weeks (33 treatments) of external radiation to the right breast and regional lymph nodes. The risks, benefits, and side effects of the treatment were outlined.We will proceed with radiation simulation on Wednesday of this week and plan to begin treatment next week. The patient is given contact information and an appointment card today. She will follow up with her PCP for further evaluation of her foot pain. We will also refer her to lymphedema clinic for education on manual massage and compression.   The above documentation reflects my direct findings during this shared patient visit. Please see the separate note by Dr. Lisbeth Renshaw on this date for the remainder of the patient's plan of care.  Carola Rhine, PAC

## 2015-06-15 NOTE — Progress Notes (Signed)
Please see the Nurse Progress Note in the MD Initial Consult Encounter for this patient. 

## 2015-06-15 NOTE — Patient Instructions (Signed)
Contact our office if you have any questions following today's appointment: [phone].  

## 2015-06-16 ENCOUNTER — Telehealth: Payer: Self-pay | Admitting: *Deleted

## 2015-06-16 NOTE — Telephone Encounter (Signed)
Called patient to inform of appt. For PT on 06-23-15- arrival time - 1:30 pm @ [facility], no answer will call later.

## 2015-06-17 ENCOUNTER — Other Ambulatory Visit: Payer: Self-pay | Admitting: Radiation Oncology

## 2015-06-17 ENCOUNTER — Ambulatory Visit: Payer: Medicare HMO | Admitting: Radiation Oncology

## 2015-06-17 ENCOUNTER — Telehealth: Payer: Self-pay | Admitting: Radiation Oncology

## 2015-06-17 DIAGNOSIS — C50411 Malignant neoplasm of upper-outer quadrant of right female breast: Secondary | ICD-10-CM

## 2015-06-17 NOTE — Telephone Encounter (Signed)
error 

## 2015-06-18 ENCOUNTER — Telehealth: Payer: Self-pay | Admitting: Hematology

## 2015-06-18 NOTE — Telephone Encounter (Signed)
Scheduled appt with Dr Iran Planas on 2/6 at 9:30am arrival 9:15. Attempted to call pt no working numbers; forwarded all information to Ms. Quita Skye

## 2015-06-19 ENCOUNTER — Encounter: Payer: Self-pay | Admitting: *Deleted

## 2015-06-19 ENCOUNTER — Ambulatory Visit
Admission: RE | Admit: 2015-06-19 | Discharge: 2015-06-19 | Disposition: A | Discharge status: Home / Self Care | Payer: Medicare HMO | Source: Ambulatory Visit | Attending: Radiation Oncology | Admitting: Radiation Oncology

## 2015-06-19 DIAGNOSIS — C50411 Malignant neoplasm of upper-outer quadrant of right female breast: Secondary | ICD-10-CM

## 2015-06-19 NOTE — Progress Notes (Signed)
[place] Psychosocial Distress Screening Clinical Social Work  Clinical Social Work was referred by distress screening protocol.  The patient scored a 8 on the Psychosocial Distress Thermometer which indicates severe distress. CSW has worked with pt in the past around issues of transportation. Clinical Social Worker attempted to phone pt to assess for distress and other psychosocial needs. Pt appears to not have a working phone currently. CSW will attempt to follow up at future radiation appointment.   ONCBCN DISTRESS SCREENING 06/15/2015  Screening Type Initial Screening  Distress experienced in past week (1-10) 8  Practical problem type Housing  Emotional problem type Depression;Nervousness/Anxiety;Adjusting to illness  Spiritual/Religous concerns type Relating to God  Physical Problem type Pain;Getting around;Tingling hands/feet;Swollen arms/legs  Physician notified of physical symptoms Yes  Referral to clinical social work Yes    Clinical Social Worker follow up needed: Yes.    If yes, follow up plan: See above  Loren Racer, [place] Worker [place]  [facility] [address] Phone: [phone] Fax: [phone]

## 2015-06-22 NOTE — Progress Notes (Signed)
  Radiation Oncology         [phone] ________________________________  Name: SHRUTHI WACHOWIAK MRN: [id number]  Date: 06/19/2015  DOB: 08/22/1955  Optical Surface Tracking Plan:  Since intensity modulated radiotherapy (IMRT) and 3D conformal radiation treatment methods are predicated on accurate and precise positioning for treatment, intrafraction motion monitoring is medically necessary to ensure accurate and safe treatment delivery.  The ability to quantify intrafraction motion without excessive ionizing radiation dose can only be performed with optical surface tracking. Accordingly, surface imaging offers the opportunity to obtain 3D measurements of patient position throughout IMRT and 3D treatments without excessive radiation exposure.  I am ordering optical surface tracking for this patient's upcoming course of radiotherapy. ________________________________  Kyung Rudd, MD 06/22/2015 7:12 PM    Reference:   Particia Jasper, et al. Surface imaging-based analysis of intrafraction motion for breast radiotherapy patients.Journal of [place], n. 6, nov. 2014. ISSN [id number].   Available at: <http://www.jacmp.org/index.php/jacmp/article/view/4957>.

## 2015-06-22 NOTE — Progress Notes (Signed)
  Radiation Oncology         [phone] ________________________________  Name: Marissa Brooks MRN: [id number]  Date: 06/19/2015  DOB: August 07, 1955  DIAGNOSIS:     ICD-9-CM ICD-10-CM   1. Breast cancer of upper-outer quadrant of right female breast s/p Right MRM 10/30/14 174.4 C50.411      SIMULATION AND TREATMENT PLANNING NOTE  The patient presented for simulation prior to beginning her course of radiation treatment for her diagnosis of right-sided breast cancer. The patient was placed in a supine position on a breast board. A customized vac-lock bag was also constructed and this complex treatment device will be used on a daily basis during her treatment. In this fashion, a CT scan was obtained through the chest area and an isocenter was placed near the chest wall at the upper aspect of the right chest.  The patient will be planned to receive a course of radiation initially to a dose of 50.4 gray. This will consist of a 4 field technique targeting the right chest wall as well as the supraclavicular region. Therefore 2 customized medial and lateral tangent fields have been created targeting the chest wall, and also 2 additional customized fields have been designed to treat the supraclavicular region both with a right supraclavicular field and a right posterior axillary boost field. A forward planning/reduced field technique will also be evaluated to determine if this significantly improves the dose homogeneity of the overall plan. Therefore, additional customized blocks/fields may be necessary.  This initial treatment will be accomplished at 1.8 gray per fraction.   The initial plan will consist of a 3-D conformal technique. The target volume/scar, heart and lungs have been contoured and dose volume histograms of each of these structures will be evaluated as part of the 3-D conformal treatment planning process.   It is anticipated that the patient will then receive a 10 gray boost to the surgical  scar. This will be accomplished at 2 gray per fraction. The final anticipated total dose therefore will correspond to 60.4 gray.    _______________________________   Jodelle Gross, MD, PhD

## 2015-06-23 ENCOUNTER — Ambulatory Visit: Payer: Medicare HMO | Attending: Radiation Oncology | Admitting: Physical Therapy

## 2015-06-23 NOTE — Addendum Note (Signed)
Encounter addended by: Doreen Beam, RN on: 06/23/2015  9:00 AM<BR>     Documentation filed: Charges VN, Visit Diagnoses

## 2015-06-23 NOTE — Telephone Encounter (Signed)
Still not able to reach patient re appointments. Schedule mail, including appointment with Dr. Iran Planas for 06/29/15 @ 9:30 am - [address] - [phone].

## 2015-06-24 DIAGNOSIS — C50411 Malignant neoplasm of upper-outer quadrant of right female breast: Secondary | ICD-10-CM | POA: Diagnosis not present

## 2015-06-26 ENCOUNTER — Ambulatory Visit
Admission: RE | Admit: 2015-06-26 | Discharge: 2015-06-26 | Disposition: A | Discharge status: Home / Self Care | Payer: Medicare HMO | Source: Ambulatory Visit | Attending: Radiation Oncology | Admitting: Radiation Oncology

## 2015-06-26 DIAGNOSIS — C50411 Malignant neoplasm of upper-outer quadrant of right female breast: Secondary | ICD-10-CM | POA: Diagnosis not present

## 2015-06-26 NOTE — Telephone Encounter (Signed)
Patient stopped by today and was given 2/6 appointment with Dr. Iran Planas and February schedule for next appointment for lab/YF.

## 2015-06-29 ENCOUNTER — Ambulatory Visit
Admission: RE | Admit: 2015-06-29 | Discharge: 2015-06-29 | Disposition: A | Discharge status: Home / Self Care | Payer: Medicare HMO | Source: Ambulatory Visit | Attending: Radiation Oncology | Admitting: Radiation Oncology

## 2015-06-29 VITALS — BP 121/74 | HR 56 | Temp 98.8°F | Ht 62.0 in

## 2015-06-29 DIAGNOSIS — C50411 Malignant neoplasm of upper-outer quadrant of right female breast: Secondary | ICD-10-CM | POA: Diagnosis present

## 2015-06-29 MED ORDER — ALRA NON-METALLIC DEODORANT (RAD-ONC)
1.0000 "application " | Freq: Once | TOPICAL | Status: AC
  Administered 2015-06-29: 1 via TOPICAL

## 2015-06-29 MED ORDER — RADIAPLEXRX EX GEL
Freq: Once | CUTANEOUS | Status: AC
  Administered 2015-06-29: 12:00:00 via TOPICAL

## 2015-06-30 ENCOUNTER — Ambulatory Visit
Admission: RE | Admit: 2015-06-30 | Discharge: 2015-06-30 | Disposition: A | Discharge status: Home / Self Care | Payer: Medicare HMO | Source: Ambulatory Visit | Attending: Radiation Oncology | Admitting: Radiation Oncology

## 2015-06-30 DIAGNOSIS — C50411 Malignant neoplasm of upper-outer quadrant of right female breast: Secondary | ICD-10-CM | POA: Diagnosis not present

## 2015-07-01 ENCOUNTER — Ambulatory Visit
Admission: RE | Admit: 2015-07-01 | Discharge: 2015-07-01 | Disposition: A | Discharge status: Home / Self Care | Payer: Medicare HMO | Source: Ambulatory Visit | Attending: Radiation Oncology | Admitting: Radiation Oncology

## 2015-07-01 DIAGNOSIS — C50411 Malignant neoplasm of upper-outer quadrant of right female breast: Secondary | ICD-10-CM | POA: Diagnosis not present

## 2015-07-02 ENCOUNTER — Ambulatory Visit
Admission: RE | Admit: 2015-07-02 | Discharge: 2015-07-02 | Disposition: A | Discharge status: Home / Self Care | Payer: Medicare HMO | Source: Ambulatory Visit | Attending: Radiation Oncology | Admitting: Radiation Oncology

## 2015-07-02 DIAGNOSIS — C50411 Malignant neoplasm of upper-outer quadrant of right female breast: Secondary | ICD-10-CM | POA: Diagnosis not present

## 2015-07-03 ENCOUNTER — Encounter: Payer: Self-pay | Admitting: Radiation Oncology

## 2015-07-03 ENCOUNTER — Ambulatory Visit
Admission: RE | Admit: 2015-07-03 | Discharge: 2015-07-03 | Disposition: A | Discharge status: Home / Self Care | Payer: Medicare HMO | Source: Ambulatory Visit | Attending: Radiation Oncology | Admitting: Radiation Oncology

## 2015-07-03 VITALS — BP 136/58 | HR 48 | Temp 98.7°F | Resp 18 | Wt 171.5 lb

## 2015-07-03 DIAGNOSIS — C50411 Malignant neoplasm of upper-outer quadrant of right female breast: Secondary | ICD-10-CM

## 2015-07-03 NOTE — Progress Notes (Signed)
Weekly rad txs  Right breast/chest wall 5/25 completd, no skin changes, dry ness slight, using radiaplex bid, occasional shooting pains in right chest area,   Appetite good , energy level fair 3:25 PM BP 136/58 mmHg  Pulse 48  Temp(Src) 98.7 F (37.1 C) (Oral)  Resp 18  Wt 171 lb 8 oz (77.792 kg)  Wt Readings from Last 3 Encounters:  07/03/15 171 lb 8 oz (77.792 kg)  06/15/15 171 lb (77.565 kg)  06/15/15 171 lb 1.6 oz (77.61 kg)

## 2015-07-03 NOTE — Progress Notes (Signed)
Department of Radiation Oncology  Phone:  [phone] Fax:        [phone]  Weekly Treatment Note    Name: Marissa Brooks Date: 07/03/2015 MRN: [id number] DOB: 1956-04-12   Diagnosis:     ICD-9-CM ICD-10-CM   1. Breast cancer of upper-outer quadrant of right female breast s/p Right MRM 10/30/14 174.4 C50.411      Current dose: 9 Gy  Current fraction: 5   MEDICATIONS: Current Outpatient Prescriptions  Medication Sig Dispense Refill  . ARIPiprazole (ABILIFY) 5 MG tablet Take 1 tablet (5 mg total) by mouth daily. For mood control 30 tablet 0  . hyaluronate sodium (RADIAPLEXRX) GEL Apply 1 application topically 2 (two) times daily.    Marland Kitchen lisinopril (PRINIVIL,ZESTRIL) 10 MG tablet Take 10 mg by mouth daily.    . non-metallic deodorant Jethro Poling) MISC Apply 1 application topically daily as needed.    . sertraline (ZOLOFT) 100 MG tablet Take 1 tablet (100 mg total) by mouth at bedtime. For depression 30 tablet 0  . traZODone (DESYREL) 100 MG tablet Take 1 tablet (100 mg total) by mouth at bedtime. For sleep (Patient taking differently: Take 100 mg by mouth daily. For sleep) 30 tablet 0  . gabapentin (NEURONTIN) 100 MG capsule Take 1 capsule (100 mg total) by mouth 3 (three) times daily. (Patient not taking: Reported on 06/15/2015) 90 capsule 2  . ondansetron (ZOFRAN) 8 MG tablet TAKE ONE TABLET   (8 MG TOTAL) BY MOUTH   TWICE A DAY   START THE DAY AFTER CHEMO FOR THREE   DAYS. THEN TAKE AS NEEDED   FOR NAUSEA OR VOMI (Patient not taking: Reported on 06/15/2015) 20 tablet 3  . pantoprazole (PROTONIX) 40 MG tablet Take 1 tablet (40 mg total) by mouth daily. (Patient not taking: Reported on 07/03/2015) 60 tablet 0   No current facility-administered medications for this encounter.     ALLERGIES: Centrum   LABORATORY DATA:  Lab Results  Component Value Date   WBC 5.5 06/15/2015   HGB 12.2 06/15/2015   HCT 36.2 06/15/2015   MCV 84.8 06/15/2015   PLT 156 06/15/2015   Lab Results    Component Value Date   NA 138 06/15/2015   K 4.7 06/15/2015   CL 116* 03/22/2015   CO2 20* 06/15/2015   Lab Results  Component Value Date   ALT 38 06/15/2015   AST 33 06/15/2015   ALKPHOS 96 06/15/2015   BILITOT <0.30 06/15/2015     NARRATIVE: Marissa Brooks was seen today for weekly treatment management. The chart was checked and the patient's films were reviewed.  Weekly rad txs  Right breast/chest wall 5/25 completd, no skin changes, dry ness slight, using radiaplex bid, occasional shooting pains in right chest area,   Appetite good , energy level fair 6:31 PM BP 136/58 mmHg  Pulse 48  Temp(Src) 98.7 F (37.1 C) (Oral)  Resp 18  Wt 171 lb 8 oz (77.792 kg)  Wt Readings from Last 3 Encounters:  07/03/15 171 lb 8 oz (77.792 kg)  06/15/15 171 lb (77.565 kg)  06/15/15 171 lb 1.6 oz (77.61 kg)    PHYSICAL EXAMINATION: weight is 171 lb 8 oz (77.792 kg). Her oral temperature is 98.7 F (37.1 C). Her blood pressure is 136/58 and her pulse is 48. Her respiration is 18.        ASSESSMENT: The patient is doing satisfactorily with treatment.  PLAN: We will continue with the patient's radiation treatment as planned.

## 2015-07-06 ENCOUNTER — Ambulatory Visit
Admission: RE | Admit: 2015-07-06 | Discharge: 2015-07-06 | Disposition: A | Discharge status: Home / Self Care | Payer: Medicare HMO | Source: Ambulatory Visit | Attending: Radiation Oncology | Admitting: Radiation Oncology

## 2015-07-06 DIAGNOSIS — C50411 Malignant neoplasm of upper-outer quadrant of right female breast: Secondary | ICD-10-CM | POA: Diagnosis not present

## 2015-07-07 ENCOUNTER — Ambulatory Visit
Admission: RE | Admit: 2015-07-07 | Discharge: 2015-07-07 | Disposition: A | Discharge status: Home / Self Care | Payer: Medicare HMO | Source: Ambulatory Visit | Attending: Radiation Oncology | Admitting: Radiation Oncology

## 2015-07-07 DIAGNOSIS — C50411 Malignant neoplasm of upper-outer quadrant of right female breast: Secondary | ICD-10-CM | POA: Diagnosis not present

## 2015-07-08 ENCOUNTER — Ambulatory Visit
Admission: RE | Admit: 2015-07-08 | Discharge: 2015-07-08 | Disposition: A | Discharge status: Home / Self Care | Payer: Medicare HMO | Source: Ambulatory Visit | Attending: Radiation Oncology | Admitting: Radiation Oncology

## 2015-07-08 DIAGNOSIS — C50411 Malignant neoplasm of upper-outer quadrant of right female breast: Secondary | ICD-10-CM | POA: Diagnosis not present

## 2015-07-09 ENCOUNTER — Ambulatory Visit
Admission: RE | Admit: 2015-07-09 | Discharge: 2015-07-09 | Disposition: A | Discharge status: Home / Self Care | Payer: Medicare HMO | Source: Ambulatory Visit | Attending: Radiation Oncology | Admitting: Radiation Oncology

## 2015-07-09 DIAGNOSIS — C50411 Malignant neoplasm of upper-outer quadrant of right female breast: Secondary | ICD-10-CM | POA: Diagnosis not present

## 2015-07-10 ENCOUNTER — Encounter: Payer: Self-pay | Admitting: Radiation Oncology

## 2015-07-10 ENCOUNTER — Ambulatory Visit: Payer: Self-pay | Admitting: Physical Therapy

## 2015-07-10 ENCOUNTER — Ambulatory Visit
Admission: RE | Admit: 2015-07-10 | Discharge: 2015-07-10 | Disposition: A | Discharge status: Home / Self Care | Payer: Medicare HMO | Source: Ambulatory Visit | Attending: Radiation Oncology | Admitting: Radiation Oncology

## 2015-07-10 VITALS — BP 171/55 | HR 48 | Temp 98.1°F | Ht 62.0 in | Wt 170.6 lb

## 2015-07-10 DIAGNOSIS — C50411 Malignant neoplasm of upper-outer quadrant of right female breast: Secondary | ICD-10-CM

## 2015-07-10 NOTE — Progress Notes (Signed)
Marissa Brooks has received 10 fractions to her right breast.  Skin to right breast with normal color, using Radiaplex as instructed.  Denies pain or discomfort to right breast.  Appetite is good.  Energy level has decreased, still  able to do some of her household chores. BP 171/55 mmHg  Pulse 48  Temp(Src) 98.1 F (36.7 C) (Oral)  Ht 5\' 2"  (1.575 m)  Wt 170 lb 9.6 oz (77.384 kg)  BMI 31.20 kg/m2  SpO2 100%

## 2015-07-12 NOTE — Progress Notes (Signed)
Department of Radiation Oncology  Phone:  [phone] Fax:        [phone]  Weekly Treatment Note    Name: Marissa Brooks Date: 07/12/2015 MRN: [id number] DOB: [birth year]   Diagnosis:     ICD-9-CM ICD-10-CM   1. Breast cancer of upper-outer quadrant of right female breast s/p Right MRM 10/30/14 174.4 C50.411      Current dose: 19.8 Gy  Current fraction: 10   MEDICATIONS: Current Outpatient Prescriptions  Medication Sig Dispense Refill  . ARIPiprazole (ABILIFY) 5 MG tablet Take 1 tablet (5 mg total) by mouth daily. For mood control 30 tablet 0  . gabapentin (NEURONTIN) 100 MG capsule Take 1 capsule (100 mg total) by mouth 3 (three) times daily. 90 capsule 2  . hyaluronate sodium (RADIAPLEXRX) GEL Apply 1 application topically 2 (two) times daily.    Marland Kitchen lisinopril (PRINIVIL,ZESTRIL) 10 MG tablet Take 10 mg by mouth daily.    . sertraline (ZOLOFT) 100 MG tablet Take 1 tablet (100 mg total) by mouth at bedtime. For depression 30 tablet 0  . traZODone (DESYREL) 100 MG tablet Take 1 tablet (100 mg total) by mouth at bedtime. For sleep (Patient taking differently: Take 100 mg by mouth daily. For sleep) 30 tablet 0  . non-metallic deodorant (ALRA) MISC Apply 1 application topically daily as needed. Reported on 07/10/2015    . ondansetron (ZOFRAN) 8 MG tablet TAKE ONE TABLET   (8 MG TOTAL) BY MOUTH   TWICE A DAY   START THE DAY AFTER CHEMO FOR THREE   DAYS. THEN TAKE AS NEEDED   FOR NAUSEA OR VOMI (Patient not taking: Reported on 06/15/2015) 20 tablet 3  . pantoprazole (PROTONIX) 40 MG tablet Take 1 tablet (40 mg total) by mouth daily. (Patient not taking: Reported on 07/03/2015) 60 tablet 0   No current facility-administered medications for this encounter.     ALLERGIES: Centrum   LABORATORY DATA:  Lab Results  Component Value Date   WBC 5.5 06/15/2015   HGB 12.2 06/15/2015   HCT 36.2 06/15/2015   MCV 84.8 06/15/2015   PLT 156 06/15/2015   Lab Results  Component Value  Date   NA 138 06/15/2015   K 4.7 06/15/2015   CL 116* 03/22/2015   CO2 20* 06/15/2015   Lab Results  Component Value Date   ALT 38 06/15/2015   AST 33 06/15/2015   ALKPHOS 96 06/15/2015   BILITOT <0.30 06/15/2015     NARRATIVE: Marissa Brooks was seen today for weekly treatment management. The chart was checked and the patient's films were reviewed.  Marissa Brooks has received 10 fractions to her right breast.  Skin to right breast with normal color, using Radiaplex as instructed.  Denies pain or discomfort to right breast.  Appetite is good.  Energy level has decreased, still  able to do some of her household chores. BP 171/55 mmHg  Pulse 48  Temp(Src) 98.1 F (36.7 C) (Oral)  Ht 5\' 2"  (1.575 m)  Wt 170 lb 9.6 oz (77.384 kg)  BMI 31.20 kg/m2  SpO2 100%  PHYSICAL EXAMINATION: height is 5\' 2"  (1.575 m) and weight is 170 lb 9.6 oz (77.384 kg). Her oral temperature is 98.1 F (36.7 C). Her blood pressure is 171/55 and her pulse is 48. Her oxygen saturation is 100%.      minimal skin change duty radiation  ASSESSMENT: The patient is doing satisfactorily with treatment.  PLAN: We will continue with the patient's radiation treatment as  planned.

## 2015-07-13 ENCOUNTER — Ambulatory Visit
Admission: RE | Admit: 2015-07-13 | Discharge: 2015-07-13 | Disposition: A | Discharge status: Home / Self Care | Payer: Medicare HMO | Source: Ambulatory Visit | Attending: Radiation Oncology | Admitting: Radiation Oncology

## 2015-07-13 ENCOUNTER — Telehealth: Payer: Self-pay | Admitting: General Practice

## 2015-07-13 DIAGNOSIS — C50411 Malignant neoplasm of upper-outer quadrant of right female breast: Secondary | ICD-10-CM | POA: Diagnosis not present

## 2015-07-14 ENCOUNTER — Ambulatory Visit
Admission: RE | Admit: 2015-07-14 | Discharge: 2015-07-14 | Disposition: A | Discharge status: Home / Self Care | Payer: Medicare HMO | Source: Ambulatory Visit | Attending: Radiation Oncology | Admitting: Radiation Oncology

## 2015-07-14 ENCOUNTER — Encounter: Payer: Self-pay | Admitting: General Practice

## 2015-07-14 DIAGNOSIS — C50411 Malignant neoplasm of upper-outer quadrant of right female breast: Secondary | ICD-10-CM | POA: Diagnosis not present

## 2015-07-14 NOTE — Progress Notes (Signed)
First individual session with client. Discussed and signed PDS and informed consent form to get audio recordings. Client denied S/I and H/I/ Client reported never having S/I in her life. Used open question to inquire about client's concerns. Client reported losing her father to alcoholism and her mother was killed when she was 60 years old. Client stated raising herself, and being an independent person. Used reflection to capture feelings of sadness and hurt. Counselor and client discussed client's battle with alcoholism, smoking, and crack. Client reported not currently using any alcohol or smoking, but smoking crack about twice a month. Counselor and client explored client's desire for coming to counseling which included having a space to talk about her feelings and begin working/developing herself towards a better life. Counselor and client explored what client meant by a better life which included finding a new place to live, getting plugged into her old church, completely stopping the drug use, and potentially looking for job/volunteer opportunities. Counselor inquired about client's drug/alcohol history, where she stated receiving support at the [facility] in the past. Client and counselor discussed client's diagnosis of breast cancer, and talked about how difficult chemo was, and about the radiation treatment that she is currently getting (March, 22nd being the final treatment). Counselor and client talked about getting support for drug use, and counselor will follow up with potential referrals next week. Counselor and client discussed client's current support and coping strategies which included God, her "adopted" parents, and her siblings. Client reported that her coping strategies included her faith, reading, walking outside, listening to music, and deep breathing. Client reported struggling with depression and anxiety. Client's strengths include being resilient, open, optimistic, hopeful, and willing  to seek help. Counselor and client discussed goals for counseling could be managing depression and anxiety, having a space to process her feelings, and working towards developing/bettering her life that includes (cessation of drug use, new housing, connected to her old church, and job/volunteer opportunities). Counselor and client are scheduled to meet next Friday for another appointment.     Wendall Papa, MS, [place], LPCA Counseling Intern, [place] [phone] Supervisor, Lorrin Jackson, MDiv, [facility]

## 2015-07-15 ENCOUNTER — Ambulatory Visit
Admission: RE | Admit: 2015-07-15 | Discharge: 2015-07-15 | Disposition: A | Discharge status: Home / Self Care | Payer: Medicare HMO | Source: Ambulatory Visit | Attending: Radiation Oncology | Admitting: Radiation Oncology

## 2015-07-15 DIAGNOSIS — C50411 Malignant neoplasm of upper-outer quadrant of right female breast: Secondary | ICD-10-CM | POA: Diagnosis not present

## 2015-07-16 ENCOUNTER — Ambulatory Visit (HOSPITAL_BASED_OUTPATIENT_CLINIC_OR_DEPARTMENT_OTHER): Payer: Medicare HMO | Admitting: Hematology

## 2015-07-16 ENCOUNTER — Other Ambulatory Visit (HOSPITAL_BASED_OUTPATIENT_CLINIC_OR_DEPARTMENT_OTHER): Payer: Medicare HMO

## 2015-07-16 ENCOUNTER — Telehealth: Payer: Self-pay | Admitting: Hematology

## 2015-07-16 ENCOUNTER — Ambulatory Visit
Admission: RE | Admit: 2015-07-16 | Discharge: 2015-07-16 | Disposition: A | Discharge status: Home / Self Care | Payer: Medicare HMO | Source: Ambulatory Visit | Attending: Radiation Oncology | Admitting: Radiation Oncology

## 2015-07-16 ENCOUNTER — Encounter: Payer: Self-pay | Admitting: Hematology

## 2015-07-16 ENCOUNTER — Ambulatory Visit: Payer: Medicare HMO | Admitting: Physical Therapy

## 2015-07-16 VITALS — BP 138/49 | HR 56 | Temp 98.5°F | Resp 18 | Ht 62.0 in | Wt 171.7 lb

## 2015-07-16 DIAGNOSIS — I890 Lymphedema, not elsewhere classified: Secondary | ICD-10-CM | POA: Diagnosis not present

## 2015-07-16 DIAGNOSIS — I10 Essential (primary) hypertension: Secondary | ICD-10-CM

## 2015-07-16 DIAGNOSIS — M79601 Pain in right arm: Secondary | ICD-10-CM | POA: Diagnosis not present

## 2015-07-16 DIAGNOSIS — K7460 Unspecified cirrhosis of liver: Secondary | ICD-10-CM | POA: Diagnosis not present

## 2015-07-16 DIAGNOSIS — Z853 Personal history of malignant neoplasm of breast: Secondary | ICD-10-CM

## 2015-07-16 DIAGNOSIS — T451X5A Adverse effect of antineoplastic and immunosuppressive drugs, initial encounter: Secondary | ICD-10-CM

## 2015-07-16 DIAGNOSIS — G620 Drug-induced polyneuropathy: Secondary | ICD-10-CM

## 2015-07-16 DIAGNOSIS — C50411 Malignant neoplasm of upper-outer quadrant of right female breast: Secondary | ICD-10-CM

## 2015-07-16 DIAGNOSIS — F39 Unspecified mood [affective] disorder: Secondary | ICD-10-CM

## 2015-07-16 DIAGNOSIS — F1910 Other psychoactive substance abuse, uncomplicated: Secondary | ICD-10-CM

## 2015-07-16 DIAGNOSIS — M79674 Pain in right toe(s): Secondary | ICD-10-CM

## 2015-07-16 LAB — CBC & DIFF AND RETIC
BASO%: 0.6 % (ref 0.0–2.0)
BASOS ABS: 0 10*3/uL (ref 0.0–0.1)
EOS ABS: 0.2 10*3/uL (ref 0.0–0.5)
EOS%: 5.6 % (ref 0.0–7.0)
HEMATOCRIT: 36.1 % (ref 34.8–46.6)
HEMOGLOBIN: 12.2 g/dL (ref 11.6–15.9)
Immature Retic Fract: 1.9 % (ref 1.60–10.00)
LYMPH%: 21 % (ref 14.0–49.7)
MCH: 28.3 pg (ref 25.1–34.0)
MCHC: 33.8 g/dL (ref 31.5–36.0)
MCV: 83.8 fL (ref 79.5–101.0)
MONO#: 0.3 10*3/uL (ref 0.1–0.9)
MONO%: 9.2 % (ref 0.0–14.0)
NEUT%: 63.6 % (ref 38.4–76.8)
NEUTROS ABS: 2.2 10*3/uL (ref 1.5–6.5)
PLATELETS: 115 10*3/uL — AB (ref 145–400)
RBC: 4.31 10*6/uL (ref 3.70–5.45)
RDW: 13.8 % (ref 11.2–14.5)
Retic %: 1.26 % (ref 0.70–2.10)
Retic Ct Abs: 54.31 10*3/uL (ref 33.70–90.70)
WBC: 3.4 10*3/uL — AB (ref 3.9–10.3)
lymph#: 0.7 10*3/uL — ABNORMAL LOW (ref 0.9–3.3)

## 2015-07-16 LAB — COMPREHENSIVE METABOLIC PANEL
ALBUMIN: 3.8 g/dL (ref 3.5–5.0)
ALK PHOS: 96 U/L (ref 40–150)
ALT: 56 U/L — ABNORMAL HIGH (ref 0–55)
ANION GAP: 7 meq/L (ref 3–11)
AST: 57 U/L — ABNORMAL HIGH (ref 5–34)
BILIRUBIN TOTAL: 0.33 mg/dL (ref 0.20–1.20)
BUN: 13.4 mg/dL (ref 7.0–26.0)
CALCIUM: 9.4 mg/dL (ref 8.4–10.4)
CO2: 26 mEq/L (ref 22–29)
Chloride: 108 mEq/L (ref 98–109)
Creatinine: 0.9 mg/dL (ref 0.6–1.1)
EGFR: 84 mL/min/{1.73_m2} — AB (ref >90)
Glucose: 74 mg/dl (ref 70–140)
POTASSIUM: 4.1 meq/L (ref 3.5–5.1)
Sodium: 141 mEq/L (ref 136–145)
Total Protein: 7.3 g/dL (ref 6.4–8.3)

## 2015-07-16 NOTE — Telephone Encounter (Signed)
per pof to sch pt appt-adv refereral of [facility] all to sch appt with her in their [facility]

## 2015-07-16 NOTE — Progress Notes (Signed)
[facility]  Telephone:([phone]) [phone] Fax:([phone]  Clinic Follow Up Note   Patient Care Team: Elwyn Reach, MD as PCP - General (Internal Medicine) Autumn Messing III, MD as Consulting Physician (General Surgery) 07/16/2015  CHIEF COMPLAINTS:  Follow-up of breast cancer  Oncology History   Breast cancer of upper-outer quadrant of right female breast s/p Right MRM 10/30/14   Staging form: Breast, AJCC 7th Edition     Pathologic: Stage IIA (T1b, N1a, cM0) - Unsigned       Breast cancer of upper-outer quadrant of right female breast s/p Right MRM 10/30/14   06/03/2004 Cancer Diagnosis Left breast DCIS, status post mastectomy   06/06/2014 Imaging A mammogram and ultrasound showed a irregular 0.7 cm mass at the 12:30 clock position of right breast. Axillary nodes were negative.   08/12/2014 Receptors her2 ER 100% positive, PR 88% positive, HER-2 negative. Ki-67 19%.   08/12/2014 Initial Biopsy Right breast 12:30 o'clock biopsy showed invasive ductal carcinoma, grade 2.   10/30/2014 Surgery Right breast mastectomy and axillary node dissection, surgical margins were negative.    10/30/2014 Initial Diagnosis Breast cancer of upper-outer quadrant of right female breast s/p Right MRM 10/30/14   10/30/2014 Pathology Results Right breast radical mastectomy showed invasive ductal carcinoma, grade 1, tumor measuring 1.0 cm, margins were negative lymphovascular invasion (+), 1 sentinel lymph nodes positive, 15 additional axillary nodes negative.  (+) DCIS    12/30/2014 - 03/03/2015 Chemotherapy Docetaxel 75 mg/m, Cytoxan 600 mg/m, every 3 weeks, s/p 4 cycles    03/15/2015 - 03/22/2015 Hospital Admission Patient was admitted for GI bleeding, required blood transfusion, EDC showed esophagitis.     HISTORY OF PRESENTING ILLNESS:  Marissa Brooks 60 y.o. female is here because of recently diagnosed right breast cancer.   This was discovered by screening mammogram in January 2016. She did not  have palpable breast mass or any other symptoms. Biopsy on 08/12/2014 showed invasive ductal carcinoma, ER/PR positive, HER-2 negative. She underwent right breast mastectomy by Dr. Marcello Moores on 10/28/2014. Marland Kitchen   She has had some pain underneath her R armpit with some swelling. She reports that her "drain fell out" and was seen by Dr. Marlou Starks on 7/15 at which time a seroma was drained. Otherwise, she denies any problems with appetite, energy, pain. She does report having lost weight 195 lbs to 186 lbs though over an unspecified time interval. She is now menopausal with last menstrual period being sometime in her late 37s.  Per records obtained from [facility], she underwent left simple mastectomy on at [facility] on 07/16/04 after detection of multiple abnormalities in the left breast on mammography and was diagnosed with DCIS. On 05/27/05, she underwent placement of left tissue expander but declined additional reconstructive surgery. On 06/15/06, she was seen by Dr. Sophronia Simas for possible adjuvant tamoxifen and was recommended to undergo close surveillance. 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   She follows with Dr. Gala Romney for PCP care and was last seen by them prior to the surgery. She is currently undergoing substance abuse rehab. She reports that 1 pack of cigarettes lasts her a week and has not used cocaine for a month ago. She does report using heroin in the past. She also has history of bipolar for which she is on Abilify. She is widowed, has no children. She lives with a roommate who she has known for the last 10 years.  CURRENT  THERAPY: Adjuvant radiation   INTERIM HISTORY Marissa Brooks returns for follow up. She has started adjuvant breast radiation on 06/29/2015. She has been tolerating well so far, has mild fatigue, no other significant toxicity. She complains her right arm lymphedema has been getting worse, and cause moderate pain. She has  started physical therapy 2 days ago. She also complains of pain at her toes and she wishes, she lost both of big toenails from chemotherapy. She otherwise is doing well overall, denies any other new symptoms.   MEDICAL HISTORY:  Past Medical History  Diagnosis Date  . Hypertension   . Shortness of breath dyspnea     with walking  . Pneumonia   . Anxiety   . Depression   . Bipolar disorder ([place])   . GERD (gastroesophageal reflux disease)   . Arthritis   . Cancer [facility]) 2002    left breast cancer tx in [place], right breast cancer 08/2014  . ETOH abuse     as of 10/21/14 none for a week  . Cocaine abuse     last use 10/13/14  . Breast cancer ([place]) 09/10/14    right breast,hx left breast ca,2002 [place]  . Allergy   . Tobacco abuse   . HCV (hepatitis C virus)     SURGICAL HISTORY: Past Surgical History  Procedure Laterality Date  . Mastectomy      and Implant  . Mandible fracture surgery    . Breast surgery  2002    left mastectomy with implant  . Tonsillectomy    . Mastectomy w/ sentinel node biopsy Right 10/30/2014  . Simple mastectomy with axillary sentinel node biopsy Right 10/30/2014    Procedure: RIGHT MASTECTOMY WITH SENTINEL NODE MAPPING;  Surgeon: Autumn Messing III, MD;  Location: [facility];  Service: General;  Laterality: Right;  . Esophagogastroduodenoscopy (egd) with propofol N/A 03/17/2015    Procedure: ESOPHAGOGASTRODUODENOSCOPY (EGD) WITH PROPOFOL;  Surgeon: Gatha Mayer, MD;  Location: [facility];  Service: Endoscopy;  Laterality: N/A;    SOCIAL HISTORY: Social History   Social History  . Marital Status: Widowed    Spouse Name: N/A  . Number of Children: N/A  . Years of Education: N/A   Occupational History  . Not on file.   Social History Main Topics  . Smoking status: Current Every Day Smoker -- 0.25 packs/day    Types: Cigarettes  . Smokeless tobacco: Never Used  . Alcohol Use: Yes     Comment: was a heavy drinker (daily) none for a week  (as of 10/21/14.  . Drug Use: Yes    Special: Cocaine, Marijuana     Comment: last use of cocaine ?10/13/14, last use of marijuana 3 weeks ago (as of 10/21/14)  . Sexual Activity: Not Currently    Birth Control/ Protection: Abstinence   Other Topics Concern  . Not on file   Social History Narrative    FAMILY HISTORY: Family History  Problem Relation Age of Onset  . Breast cancer Sister 68  . Mental illness Mother   . HIV/AIDS Sister     Deceased    ALLERGIES:  is allergic to centrum.  MEDICATIONS:  Current Outpatient Prescriptions  Medication Sig Dispense Refill  . ARIPiprazole (ABILIFY) 5 MG tablet Take 1 tablet (5 mg total) by mouth daily. For mood control 30 tablet 0  . gabapentin (NEURONTIN) 100 MG capsule Take 1 capsule (100 mg total) by mouth 3 (three) times daily. 90 capsule 2  . hyaluronate sodium (RADIAPLEXRX)  GEL Apply 1 application topically 2 (two) times daily.    Marland Kitchen lisinopril (PRINIVIL,ZESTRIL) 10 MG tablet Take 10 mg by mouth daily.    . non-metallic deodorant Jethro Poling) MISC Apply 1 application topically daily as needed. Reported on 07/10/2015    . sertraline (ZOLOFT) 100 MG tablet Take 1 tablet (100 mg total) by mouth at bedtime. For depression 30 tablet 0  . traZODone (DESYREL) 100 MG tablet Take 1 tablet (100 mg total) by mouth at bedtime. For sleep (Patient taking differently: Take 100 mg by mouth daily. For sleep) 30 tablet 0  . ondansetron (ZOFRAN) 8 MG tablet TAKE ONE TABLET   (8 MG TOTAL) BY MOUTH   TWICE A DAY   START THE DAY AFTER CHEMO FOR THREE   DAYS. THEN TAKE AS NEEDED   FOR NAUSEA OR VOMI (Patient not taking: Reported on 06/15/2015) 20 tablet 3  . pantoprazole (PROTONIX) 40 MG tablet Take 1 tablet (40 mg total) by mouth daily. (Patient not taking: Reported on 07/03/2015) 60 tablet 0   No current facility-administered medications for this visit.    REVIEW OF SYSTEMS:   Constitutional: Denies fevers, chills or abnormal night sweats Eyes: Denies blurriness  of vision, double vision or watery eyes Ears, nose, mouth, throat, and face: Denies mucositis or sore throat Respiratory: Denies cough, dyspnea or wheezes Cardiovascular: Denies palpitation, chest discomfort or lower extremity swelling Gastrointestinal:  Denies nausea, heartburn or change in bowel habits Skin: Denies abnormal skin rashes Lymphatics: Denies new lymphadenopathy or easy bruising Neurological:Denies numbness, tingling or new weaknesses Behavioral/Psych: Mood is stable, no new changes  All other systems were reviewed with the patient and are negative.  PHYSICAL EXAMINATION: ECOG PERFORMANCE STATUS: 0 - Asymptomatic  Filed Vitals:   07/16/15 1104  BP: 138/49  Pulse: 56  Temp: 98.5 F (36.9 C)  Resp: 18   Filed Weights   07/16/15 1104  Weight: 171 lb 11.2 oz (77.883 kg)    GENERAL:alert, no distress and comfortable SKIN: skin color, texture, turgor are normal, no rashes or significant lesions, except multiple large healing skim rash/ulcers and pigmentation/color change on b/l front arms, L>R. She has a healing skin lesion on her scalp  EYES: normal, conjunctiva are pink and non-injected, sclera clear OROPHARYNX:no exudate, no erythema and lips, buccal mucosa, and tongue normal  NECK: supple, thyroid normal size, non-tender, without nodularity LYMPH:  no palpable lymphadenopathy in the cervical, axillary or inguinal BREAST: s/p bilateral mastectomy, s/p left implant placement. exam of the left breast, right chest wall and bilateral axilla revealed no palpable mass or adenopathy. LUNGS: clear to auscultation and percussion with normal breathing effort HEART: regular rate & rhythm and no murmurs and no lower extremity edema ABDOMEN:abdomen soft, non-tender and normal bowel sounds Musculoskeletal:no cyanosis of digits and no clubbing  PSYCH: alert & oriented x 3 NEURO: no focal motor/sensory deficits Ext: Moderate right arm lymphedema involving the forearm and hand,  right shoulder mobility is normal.  LABORATORY DATA:  I have reviewed the data as listed CBC Latest Ref Rng 07/16/2015 06/15/2015 03/22/2015  WBC 3.9 - 10.3 10e3/uL 3.4(L) 5.5 9.6  Hemoglobin 11.6 - 15.9 g/dL 12.2 12.2 8.4(L)  Hematocrit 34.8 - 46.6 % 36.1 36.2 25.6(L)  Platelets 145 - 400 10e3/uL 115(L) 156 166    CMP Latest Ref Rng 07/16/2015 06/15/2015 03/22/2015  Glucose 70 - 140 mg/dl 74 101 112(H)  BUN 7.0 - 26.0 mg/dL 13.4 19.9 6  Creatinine 0.6 - 1.1 mg/dL 0.9 0.9 1.00  Sodium  136 - 145 mEq/L 141 138 140  Potassium 3.5 - 5.1 mEq/L 4.1 4.7 3.3(L)  Chloride 101 - 111 mmol/L - - 116(H)  CO2 22 - 29 mEq/L 26 20(L) 21(L)  Calcium 8.4 - 10.4 mg/dL 9.4 9.5 7.7(L)  Total Protein 6.4 - 8.3 g/dL 7.3 7.7 -  Total Bilirubin 0.20 - 1.20 mg/dL 0.33 <0.30 -  Alkaline Phos 40 - 150 U/L 96 96 -  AST 5 - 34 U/L 57(H) 33 -  ALT 0 - 55 U/L 56(H) 38 -    PATHOLOGY REPORT 10/30/2014 ADDITIONAL INFORMATION: 2. FLUORESCENCE IN-SITU HYBRIDIZATION  Results: HER2 - NEGATIVE RATIO OF HER2/CEP17 SIGNALS 1.62 AVERAGE HER2 COPY NUMBER PER CELL 2.10 Reference Range: NEGATIVE HER2/CEP17 Ratio <2.0 and average HER2 copy number <4.0 EQUIVOCAL HER2/CEP17 Ratio <2.0 and average HER2 copy number 4.0 and <6.0 POSITIVE HER2/CEP17 Ratio >=2.0 or <2.0 and average HER2 copy number >=6.0 [place]i RUND DO Pathologist, Electronic Signature ( Signed 11/26/2014) 2. her2 This is NOT signed out FINAL DIAGNOSIS Diagnosis 1. Lymph node, sentinel, biopsy, Right #1 - ONE LYMPH NODE, POSITIVE FOR METASTATIC MAMMARY CARCINOMA (1/1). - INTRANODAL TUMOR DEPOSIT IS 1.2 CM - POSITIVE FOR EXTRACAPSULAR TUMOR EXTENSION. 2. Breast, radical mastectomy (including lymph nodes), Right and axillary contents - INVASIVE DUCTAL CARCINOMA, SEE COMMENT. - POSITIVE FOR LYMPH VASCULAR INVASION. - DUCTAL CARCINOMA IN SITU WITH NECROSIS AND CALCIFICATIONS. - FIFTEEN LYMPH NODE S, NEGATIVE FOR TUMOR (0/15). - PREVIOUS BIOPSY SITE. - SEE  TUMOR SYNOPTIC TEMPLATE BELOW  Microscopic Comment 2. BREAST, INVASIVE TUMOR, WITH LYMPH NODES PRESENT Specimen, including laterality and lymph node sampling (sentinel, non-sentinel): Right breast with sentinel lymph node sampling Procedure: Radical mastectomy Histologic type: Ductal Grade: I of III Tubule formation: 2 Nuclear pleomorphism: 2 Mitotic: 1 Tumor size (gross measurement): 1.0 cm Margins: Invasive, distance to closest margin: 1.8 cm (posterior) In-situ, distance to closest margin: 1.8 cm (posterior) If margin positive, focally or broadly: N/A Lymphovascular invasion: Present Ductal carcinoma in situ: Present Grade: 2 of 3 Extensive intraductal component: Absent Lobular neoplasia: Absent Tumor focality: Unifocal Treatment effect: None If present, treatment effect in breast tissue, lymph nodes or both: N/A Extent of tumor: Skin: N/A Nipple: N/A Skeletal muscle: N/A Lymph nodes: Examined: 1 Sentinel 15 Non-sentinel 16 Total Lymph nodes with metastasis: 1 Isolated tumor cells (< 0.2 mm): 0 Micrometastasis: (> 0.2 mm and < 2.0 mm): 0 Macrometastasis: (> 2.0 mm): x 1 Extracapsular extension: Present Breast prognostic profile: Estrogen receptor: Not repeated, previous study demonstrated 100% positivity ([id number]) Progesterone receptor: Not repeated, previous study demonstrated 88% positivity ([id number]) Her 2 neu: Repeated, previous study demonstrated no amplification ([id number]) Ki-67: Not repeated, previous study demonstrated 19% proliferation rate ([id number]) Non-neoplastic breast: Previous biopsy site, fibrocystic change, columnar cell change, sclerosing adenosis, benign adenosis and calcifications TNM: p T1b, pN1a, pMX   RADIOGRAPHIC STUDIES: I have personally reviewed the radiological images as listed and agreed with the findings in the report. No results found.  ASSESSMENT & PLAN:  Ms. Mcenery is a 60 year old female with h/o left DCIS s/p  mastectomy who presents with R invasive ductal carcinoma s/p mastectomy  1. Stage IIa, pT1bpN1a,M0, right invasive ductal carcinoma s/p mastectomy, ER+/PR+/HER- , G1 -Discussed with the patient the results of her surgical path and her cancer staging -The natural history of breast cancer were reviewed with her, and moderate risk of cancer recurrence after her surgery, giving the note positive disease. -She has now completed adjuvant chemotherapy, and is undergoing adjuvant radiation. -Giving her ER  and PR positive tumor, I recommend antiestrogen therapy after she completes breast irradiation. -She would like to see a plastic surgeon for right implant placement in the future   2. Genetics -Giving her asthma history of breast cancer twice, and family history (sister) of breast cancer, I recommended genetic counseling but she declined.   -She does not have children, but does have sisters.  3. History of cirrhosis and HCV infection -No imaging or additional workup found in our system though noted to have undergone treatment for HCV in 1999 at [facility] per note by Aurora Mask  [06/02/09] -Most recent LFTs reassuring -She still has very high HCV load, will refer her to liver clinic or ID   4. Polysubstance abuse: Recently hospitalized in February 2016 for substance-induced mood disorder. -Continue to encourage patient to continue with substance rehab and avoid illicit substances -She agrees to quit alcohol and illicit drug (cocane) completely when she is on chemo, she will working on smoking cessation also -Her urine test was still positive for cocaine on August 9, we discussed cocaine cessation again, and she agrees to stop it completely  5. Hypertension:  -Continue follow-up with her primary care physician. -She has been slightly on high  6. Mood disorder:  -She does not follow with a psychiatrist and reports her PCP prescribes all of her psychiatric medications.  -Defer to PCP  7.  Right arm lymph edema  -continue PT  8. Pain from right arm and toes -I recommend her to increase Neurontin gradually from 100 mg 3 times daily to 255m 3 times daily if she can tolerate -She will also use Tylenol or ibuprofen as needed -I would avoid the narcotics due to the history of substance abuse  Plan -Continue breast radiation -I'll see her back in 5-6 weeks to start her on aromatase inhibitor -Increase Neurontin for pain control -She'll continue physical therapy for her right arm lymphedema  FTruitt Merle 07/16/2015

## 2015-07-17 ENCOUNTER — Ambulatory Visit
Admission: RE | Admit: 2015-07-17 | Discharge: 2015-07-17 | Disposition: A | Discharge status: Home / Self Care | Payer: Medicare HMO | Source: Ambulatory Visit | Attending: Radiation Oncology | Admitting: Radiation Oncology

## 2015-07-17 ENCOUNTER — Encounter: Payer: Self-pay | Admitting: Radiation Oncology

## 2015-07-17 ENCOUNTER — Telehealth: Payer: Self-pay | Admitting: *Deleted

## 2015-07-17 VITALS — BP 139/74 | HR 51 | Temp 98.1°F | Ht 62.0 in | Wt 172.0 lb

## 2015-07-17 DIAGNOSIS — C50411 Malignant neoplasm of upper-outer quadrant of right female breast: Secondary | ICD-10-CM

## 2015-07-17 MED ORDER — RADIAPLEXRX EX GEL
Freq: Once | CUTANEOUS | Status: AC
  Administered 2015-07-17: 11:00:00 via TOPICAL

## 2015-07-17 NOTE — Progress Notes (Addendum)
Marissa Brooks has completed 15 fractions to her right breast.  She reports pain in her right arm from lymphedema.  She had to cancel her first PT appointment yesterday.  She reports slight fatigue.  She is using radiaplex and has been given another tube.  The skin on her right chest has hyperpigmentation.  BP 139/74 mmHg  Pulse 51  Temp(Src) 98.1 F (36.7 C) (Oral)  Ht 5\' 2"  (1.575 m)  Wt 172 lb (78.019 kg)  BMI 31.45 kg/m2

## 2015-07-17 NOTE — Progress Notes (Signed)
Department of Radiation Oncology  Phone:  [phone] Fax:        [phone]  Weekly Treatment Note    Name: Marissa Brooks Date: 07/17/2015 MRN: [id number] DOB: 08-03-1955   Diagnosis:     ICD-9-CM ICD-10-CM   1. Breast cancer of upper-outer quadrant of right female breast s/p Right MRM 10/30/14 174.4 C50.411 hyaluronate sodium (RADIAPLEXRX) gel     Current dose: 27 Gy  Current fraction: 15   MEDICATIONS: Current Outpatient Prescriptions  Medication Sig Dispense Refill  . ARIPiprazole (ABILIFY) 5 MG tablet Take 1 tablet (5 mg total) by mouth daily. For mood control 30 tablet 0  . gabapentin (NEURONTIN) 100 MG capsule Take 1 capsule (100 mg total) by mouth 3 (three) times daily. 90 capsule 2  . hyaluronate sodium (RADIAPLEXRX) GEL Apply 1 application topically 2 (two) times daily.    Marland Kitchen lisinopril (PRINIVIL,ZESTRIL) 10 MG tablet Take 10 mg by mouth daily.    . non-metallic deodorant Jethro Poling) MISC Apply 1 application topically daily as needed. Reported on 07/10/2015    . sertraline (ZOLOFT) 100 MG tablet Take 1 tablet (100 mg total) by mouth at bedtime. For depression 30 tablet 0  . traZODone (DESYREL) 100 MG tablet Take 1 tablet (100 mg total) by mouth at bedtime. For sleep (Patient taking differently: Take 100 mg by mouth daily. For sleep) 30 tablet 0  . ondansetron (ZOFRAN) 8 MG tablet TAKE ONE TABLET   (8 MG TOTAL) BY MOUTH   TWICE A DAY   START THE DAY AFTER CHEMO FOR THREE   DAYS. THEN TAKE AS NEEDED   FOR NAUSEA OR VOMI (Patient not taking: Reported on 06/15/2015) 20 tablet 3  . pantoprazole (PROTONIX) 40 MG tablet Take 1 tablet (40 mg total) by mouth daily. (Patient not taking: Reported on 07/03/2015) 60 tablet 0   No current facility-administered medications for this encounter.     ALLERGIES: Centrum   LABORATORY DATA:  Lab Results  Component Value Date   WBC 3.4* 07/16/2015   HGB 12.2 07/16/2015   HCT 36.1 07/16/2015   MCV 83.8 07/16/2015   PLT 115* 07/16/2015     Lab Results  Component Value Date   NA 141 07/16/2015   K 4.1 07/16/2015   CL 116* 03/22/2015   CO2 26 07/16/2015   Lab Results  Component Value Date   ALT 56* 07/16/2015   AST 57* 07/16/2015   ALKPHOS 96 07/16/2015   BILITOT 0.33 07/16/2015     NARRATIVE: Marissa Brooks was seen today for weekly treatment management. The chart was checked and the patient's films were reviewed.  Marissa Brooks has completed 15 fractions to her right breast.  She reports pain in her right arm from lymphedema.  She had to cancel her first PT appointment yesterday.  She reports slight fatigue.  She is using radiaplex and has been given another tube.  The skin on her right chest has hyperpigmentation.  BP 139/74 mmHg  Pulse 51  Temp(Src) 98.1 F (36.7 C) (Oral)  Ht 5\' 2"  (1.575 m)  Wt 172 lb (78.019 kg)  BMI 31.45 kg/m2  PHYSICAL EXAMINATION: height is 5\' 2"  (1.575 m) and weight is 172 lb (78.019 kg). Her oral temperature is 98.1 F (36.7 C). Her blood pressure is 139/74 and her pulse is 51.      The patient's skin shows hyperpigmentation without significant desquamation.  ASSESSMENT: The patient is doing satisfactorily with treatment.  PLAN: We will continue with the patient's radiation  treatment as planned. The patient is complaining of some lymphedema in the right arm. She is scheduled to be seen by physical therapy in the near future.

## 2015-07-17 NOTE — Telephone Encounter (Signed)
Attempted to call pt but unable to leave message on recorder to tell pt that Dr Ritta Slot office called & suggested Dental office near [place]. ? [facility], ph # [phone].  They will see medicaid patients.  She just needs to call that office to set up appt.

## 2015-07-20 ENCOUNTER — Ambulatory Visit: Payer: Medicare HMO | Admitting: Physical Therapy

## 2015-07-20 ENCOUNTER — Ambulatory Visit
Admission: RE | Admit: 2015-07-20 | Discharge: 2015-07-20 | Disposition: A | Discharge status: Home / Self Care | Payer: Medicare HMO | Source: Ambulatory Visit | Attending: Radiation Oncology | Admitting: Radiation Oncology

## 2015-07-20 DIAGNOSIS — C50411 Malignant neoplasm of upper-outer quadrant of right female breast: Secondary | ICD-10-CM | POA: Diagnosis not present

## 2015-07-21 ENCOUNTER — Ambulatory Visit
Admission: RE | Admit: 2015-07-21 | Discharge: 2015-07-21 | Disposition: A | Discharge status: Home / Self Care | Payer: Medicare HMO | Source: Ambulatory Visit | Attending: Radiation Oncology | Admitting: Radiation Oncology

## 2015-07-21 DIAGNOSIS — C50411 Malignant neoplasm of upper-outer quadrant of right female breast: Secondary | ICD-10-CM | POA: Diagnosis not present

## 2015-07-22 ENCOUNTER — Ambulatory Visit
Admission: RE | Admit: 2015-07-22 | Discharge: 2015-07-22 | Disposition: A | Discharge status: Home / Self Care | Payer: Medicare HMO | Source: Ambulatory Visit | Attending: Radiation Oncology | Admitting: Radiation Oncology

## 2015-07-22 DIAGNOSIS — C50411 Malignant neoplasm of upper-outer quadrant of right female breast: Secondary | ICD-10-CM | POA: Diagnosis not present

## 2015-07-23 ENCOUNTER — Ambulatory Visit: Payer: Medicare HMO | Attending: Radiation Oncology | Admitting: Physical Therapy

## 2015-07-23 ENCOUNTER — Ambulatory Visit
Admission: RE | Admit: 2015-07-23 | Discharge: 2015-07-23 | Disposition: A | Discharge status: Home / Self Care | Payer: Medicare HMO | Source: Ambulatory Visit | Attending: Radiation Oncology | Admitting: Radiation Oncology

## 2015-07-23 ENCOUNTER — Encounter: Payer: Self-pay | Admitting: Physical Therapy

## 2015-07-23 DIAGNOSIS — I972 Postmastectomy lymphedema syndrome: Secondary | ICD-10-CM | POA: Insufficient documentation

## 2015-07-23 DIAGNOSIS — M25611 Stiffness of right shoulder, not elsewhere classified: Secondary | ICD-10-CM | POA: Insufficient documentation

## 2015-07-23 DIAGNOSIS — E8989 Other postprocedural endocrine and metabolic complications and disorders: Secondary | ICD-10-CM | POA: Insufficient documentation

## 2015-07-23 DIAGNOSIS — M79601 Pain in right arm: Secondary | ICD-10-CM

## 2015-07-23 DIAGNOSIS — C50411 Malignant neoplasm of upper-outer quadrant of right female breast: Secondary | ICD-10-CM | POA: Diagnosis not present

## 2015-07-23 DIAGNOSIS — R293 Abnormal posture: Secondary | ICD-10-CM | POA: Insufficient documentation

## 2015-07-23 DIAGNOSIS — I890 Lymphedema, not elsewhere classified: Secondary | ICD-10-CM | POA: Insufficient documentation

## 2015-07-23 NOTE — Telephone Encounter (Signed)
Left patient a voicemail to confirm the counseling appointment scheduled for tomorrow morning.

## 2015-07-23 NOTE — Therapy (Signed)
[place], [place], [address] Phone: [phone]   Fax:  [phone]  Physical Therapy Evaluation  Patient Details  Name: Marissa Brooks MRN: [id number] Date of Birth: 14-Jan-1956 Referring Provider: Shona Simpson, PA-C, Dr. Iran Planas  Encounter Date: 07/23/2015      PT End of Session - 07/23/15 1009    Visit Number 1   Number of Visits 24   Date for PT Re-Evaluation 09/17/15   PT Start Time 0930   PT Stop Time 1015   PT Time Calculation (min) 45 min   Activity Tolerance Patient tolerated treatment well   Behavior During Therapy [facility] for tasks assessed/performed      Past Medical History  Diagnosis Date  . Hypertension   . Shortness of breath dyspnea     with walking  . Pneumonia   . Anxiety   . Depression   . Bipolar disorder ([place])   . GERD (gastroesophageal reflux disease)   . Arthritis   . Cancer [facility]) 2002    left breast cancer tx in [place], right breast cancer 08/2014  . ETOH abuse     as of 10/21/14 none for a week  . Cocaine abuse     last use 10/13/14  . Breast cancer ([place]) 09/10/14    right breast,hx left breast ca,2002 [place]  . Allergy   . Tobacco abuse   . HCV (hepatitis C virus)     Past Surgical History  Procedure Laterality Date  . Mastectomy      and Implant  . Mandible fracture surgery    . Breast surgery  2002    left mastectomy with implant  . Tonsillectomy    . Mastectomy w/ sentinel node biopsy Right 10/30/2014  . Simple mastectomy with axillary sentinel node biopsy Right 10/30/2014    Procedure: RIGHT MASTECTOMY WITH SENTINEL NODE MAPPING;  Surgeon: Autumn Messing III, MD;  Location: [facility];  Service: General;  Laterality: Right;  . Esophagogastroduodenoscopy (egd) with propofol N/A 03/17/2015    Procedure: ESOPHAGOGASTRODUODENOSCOPY (EGD) WITH PROPOFOL;  Surgeon: Gatha Mayer, MD;  Location: [facility];  Service: Endoscopy;  Laterality: N/A;     There were no vitals filed for this visit.  Visit Diagnosis:  Post-lymphadenectomy lymphedema of arm - Plan: PT plan of care cert/re-cert  Right arm pain - Plan: PT plan of care cert/re-cert  Abnormal posture - Plan: PT plan of care cert/re-cert  Shoulder stiffness, right - Plan: PT plan of care cert/re-cert      Subjective Assessment - 07/23/15 0938    Subjective Pt reports right arm swelling began in 11/16 while in ICU for vomiting blood and pneumonia per her report.  She also reports left arm and left leg weakness while in hospital.  She underwent home health PT for weakness which ended in 12/16.   Pertinent History Bilateral mastectomies (left 2005 and right 10/30/14) with right SLNB.  Adjuvant chemotherapy 2016 and currently doing radiation for right chest.  Bipolar disorder managed with medication. Patient admits to alcohol and crack use.   Patient Stated Goals reduce right arm swelling.   Currently in Pain? Yes   Pain Score 6    Pain Location Arm   Pain Orientation Right   Pain Descriptors / Indicators Numbness;Throbbing   Pain Type Chronic pain   Pain Onset More than a month ago   Pain Frequency Intermittent   Aggravating Factors  Increase in swelling   Pain Relieving Factors reduction in  swelling   Multiple Pain Sites No            [facility] PT Assessment - 07/23/15 0001    Assessment   Medical Diagnosis Right arm lymphedema   Referring Provider Shona Simpson, PA-C, Dr. Iran Planas   Onset Date/Surgical Date 03/24/15   Hand Dominance Left   Prior Therapy none   Precautions   Precautions Other (comment)   Precaution Comments Hx bilateral cancer; bil UE lymphedema risk; current drug user; bipolar disorder   Restrictions   Weight Bearing Restrictions No   Balance Screen   Has the patient fallen in the past 6 months No   Has the patient had a decrease in activity level because of a fear of falling?  No   Is the patient reluctant to leave their home because of a  fear of falling?  No   Home Environment   Living Environment Other (Comment)  Rents room in house   Prior Function   Level of Independence Independent   Vocation On disability   Leisure She does not exercise   Cognition   Overall Cognitive Status Difficult to assess   Difficult to assess due to Level of arousal  Was asleep in lobby before eval; appears sleepy   Observation/Other Assessments   Observations Lymphedema Life Impact Scale score 29 with 43% functional limitation and Medicare G-code score of CK indicating 40-60% impairment   Posture/Postural Control   Posture/Postural Control Postural limitations   Postural Limitations Rounded Shoulders;Forward head   ROM / Strength   AROM / PROM / Strength AROM;Strength   AROM   AROM Assessment Site Shoulder   Right/Left Shoulder Right;Left   Right Shoulder Extension 40 Degrees   Right Shoulder Flexion 144 Degrees   Right Shoulder ABduction 143 Degrees   Right Shoulder Internal Rotation 70 Degrees   Right Shoulder External Rotation 83 Degrees   Left Shoulder Extension 43 Degrees   Left Shoulder Flexion 147 Degrees   Left Shoulder ABduction 136 Degrees   Left Shoulder Internal Rotation 61 Degrees   Left Shoulder External Rotation 74 Degrees   Strength   Strength Assessment Site Shoulder   Right/Left Shoulder Right;Left   Right Shoulder Flexion 5/5   Right Shoulder Extension 5/5   Right Shoulder ABduction 5/5   Right Shoulder Internal Rotation 5/5   Right Shoulder External Rotation 5/5   Left Shoulder Flexion 4/5   Left Shoulder Extension 5/5   Left Shoulder ABduction 4/5   Left Shoulder Internal Rotation 4/5   Left Shoulder External Rotation 4/5   Palpation   Palpation comment Pitting edema present on right anterior nd posterior forearm, dorsal hand and distal upper lateral arm; very fibrotic and thick tissue present           LYMPHEDEMA/ONCOLOGY QUESTIONNAIRE - 07/23/15 0956    Type   Cancer Type Right breast and hx  of left breast   Surgeries   Mastectomy Date 10/30/14   Sentinel Lymph Node Biopsy Date 10/30/14   Number Lymph Nodes Removed 1   Date Lymphedema/Swelling Started   Date 03/24/15   Treatment   Active Chemotherapy Treatment No   Past Chemotherapy Treatment Yes   Date 02/21/15   Active Radiation Treatment Yes   Date 06/26/15   Body Site right breast/axilla   Past Radiation Treatment No   What other symptoms do you have   Are you Having Heaviness or Tightness Yes   Are you having Pain Yes   Are you having pitting edema  Yes   Body Site Right forearm, hand, upper arm distally   Is it Hard or Difficult finding clothes that fit No   Do you have infections No   Is there Decreased scar mobility Yes  around incision with visible edema present lateral incision   [place] Sign No   Lymphedema Stage   Stage STAGE 2 SPONTANEOUSLY IRREVERSIBLE   Lymphedema Assessments   Lymphedema Assessments Upper extremities   Right Upper Extremity Lymphedema   15 cm Proximal to Olecranon Process 34.9 cm   10 cm Proximal to Olecranon Process 33.7 cm   Olecranon Process 28.6 cm   15 cm Proximal to Ulnar Styloid Process 28.1 cm   10 cm Proximal to Ulnar Styloid Process 24.2 cm   Just Proximal to Ulnar Styloid Process 19.6 cm   Across Hand at [employer] 19.3 cm   At [place] of 2nd Digit 6.3 cm   Left Upper Extremity Lymphedema   15 cm Proximal to Olecranon Process 30.2 cm   10 cm Proximal to Olecranon Process 29 cm   Olecranon Process 24 cm   15 cm Proximal to Ulnar Styloid Process 22.9 cm   10 cm Proximal to Ulnar Styloid Process 20 cm   Just Proximal to Ulnar Styloid Process 16 cm   Across Hand at [employer] 18.8 cm   At [place] of 2nd Digit 6.2 cm           Quick Dash - 07/23/15 0001    Open a tight or new jar Moderate difficulty   Carry a shopping bag or briefcase Mild difficulty   Wash your back Moderate difficulty   Use a knife to cut food Moderate difficulty   Recreational  activities in which you take some force or impact through your arm, shoulder, or hand (golf, hammering, tennis) Moderate difficulty   During the past week, to what extent has your arm, shoulder or hand problem interfered with your normal social activities with family, friends, neighbors, or groups? Quite a bit   During the past week, to what extent has your arm, shoulder or hand problem limited your work or other regular daily activities Modererately   Arm, shoulder, or hand pain. Moderate   Tingling (pins and needles) in your arm, shoulder, or hand Moderate   Difficulty Sleeping Moderate difficulty   DASH Score 43.18 %                     PT Education - 07/23/15 1041    Education provided Yes   Education Details Lymphedema causes and treatment; importance of compliance with attending appointments   Person(s) Educated Patient   Methods Explanation   Comprehension Verbalized understanding           Short Term Clinic Goals - 07/23/15 1255    CC Short Term Goal  #1   Title Patient will verbalize understanding of lymphedema risk reduction practices.   Time 4   Period Weeks   Status New   CC Short Term Goal  #2   Title Patient will reduce right UE at 10 cm proximal to olecranon process to </= 32 cm.   Baseline 33.7 cm   Time 4   Period Weeks   Status New   CC Short Term Goal  #3   Title Patient will reduce right UE at 10 cm proximal to ulnar styloid process to </= 22 cm.   Baseline 24.2   Time 4   Period Weeks  Status New   CC Short Term Goal  #4   Title Patient will improve her lymphedema life impact scale score to </= 24 for improved function   Baseline 29   Time 4   Period Weeks   Status New   CC Short Term Goal  #5   Title Patient will report >/= 25% improvement in right UE pain and tightness.   Time 4   Period Weeks   Status New             Long Term Clinic Goals - 2015-08-11 1257    CC Long Term Goal  #1   Title Patient will verbalize  understanding of where and how to be fitted for a compression sleeve and glove.   Time 8   Period Weeks   Status New   CC Long Term Goal  #2   Title Patient will reduce right UE at 10 cm proximal to olecranon process to </= 30 cm.   Baseline 33.7 cm   Time 8   Period Weeks   Status New   CC Long Term Goal  #3   Title Patient will reduce right UE at 10 cm proximal to ulnar styloid process process to </= 21 cm.   Baseline 24.2 cm   Time 8   Period Weeks   Status New   CC Long Term Goal  #4   Title Patient will improve her lymphedema life impact scale score to </= 20 for improved function   Baseline 29   Time 8   Period Weeks   Status New   CC Long Term Goal  #5   Title Patient will report >/= 50% improvement in right UE pain and tightness.   Time 8   Period Weeks   Status New            Plan - 08/11/2015 1041    Clinical Impression Statement Patient is a 60 y.o. woman s/p right mastectomy with sentinel node biopsy who is currently undergoing radiation.  She developed right arm swelling in 11/16. Her swelling is significant with fibrotic tissue and pitting edema.  She will benefit from PT to reduce swelling and instruct pt with self management.  She also has decreased shoulder ROM which can be addressed after lymphedema is addressed.   Pt will benefit from skilled therapeutic intervention in order to improve on the following deficits Increased edema;Pain;Decreased knowledge of precautions;Decreased range of motion;Impaired UE functional use   Rehab Potential Good   Clinical Impairments Affecting Rehab Potential Drug use and compliance concerns (pt canceled first 2 evals)   PT Frequency 3x / week   PT Duration 8 weeks   PT Treatment/Interventions Therapeutic exercise;Manual techniques;Patient/family education;Passive range of motion;Manual lymph drainage;ADLs/Self Care Home Management   PT Next Visit Plan Begin complete decongestive therapy; order was faxed to Dr. Iran Planas today  for garments   Consulted and Agree with Plan of Care Patient          G-Codes - 2015-08-11 1259    Functional Assessment Tool Used Lymphedema Life Impact Scale   Functional Limitation Other PT primary   Other PT Primary Current Status [id number]) At least 40 percent but less than 60 percent impaired, limited or restricted   Other PT Primary Goal Status [id number]) At least 20 percent but less than 40 percent impaired, limited or restricted       Problem List Patient Active Problem List   Diagnosis Date Noted  . Peripheral neuropathy due to chemotherapy (  [place]) 06/15/2015  . AKI (acute kidney injury) ([place])   . Esophagitis   . Leukocytosis 03/17/2015  . Acute esophagitis 03/17/2015  . HCAP (healthcare-associated pneumonia) 03/17/2015  . Hematemesis with nausea   . Acute blood loss anemia 03/16/2015  . Dysphagia 03/16/2015  . Hypocalcemia 03/16/2015  . GIB (gastrointestinal bleeding) 03/15/2015  . Tobacco abuse 03/15/2015  . ARF (acute renal failure) ([place]) 03/15/2015  . Hypokalemia 03/15/2015  . Depression 03/15/2015  . Hypertension   . GERD (gastroesophageal reflux disease)   . Anxiety   . Acute kidney injury ([place])   . Essential hypertension   . Gastroesophageal reflux disease without esophagitis   . Breast cancer of upper-outer quadrant of right female breast s/p Right MRM 10/30/14 10/30/2014  . Alcohol dependence ([place]) 07/09/2013  . MDD (major depressive disorder) ([place]) 07/09/2013  . Alcohol abuse with alcohol-induced mood disorder ([place]) 07/08/2013  . 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 OBESITY 05/13/2010  . TONGUE DISORDER 01/20/2010  . BACK PAIN, LUMBAR 09/03/2008  . THROMBOCYTOPENIA 06/08/2007  . HCV (hepatitis C virus) 06/07/2007  . BIPOLAR AFFECTIVE DISORDER 06/07/2007  . Substance abuse 06/07/2007  . PTSD 06/07/2007  . CONSTIPATION 06/07/2007  . HEPATIC CIRRHOSIS 06/07/2007  . UNSPECIFIED CONCUSSION 06/07/2007  . NEOPLASM, MALIGNANT, BREAST, HX OF 06/07/2007   Annia Friendly, PT 07/23/2015 1:03 PM  [place], [place], [address] Phone: [phone]   Fax:  [phone]  Name: Marissa Brooks MRN: [id number] Date of Birth: 1955/07/31

## 2015-07-24 ENCOUNTER — Ambulatory Visit
Admission: RE | Admit: 2015-07-24 | Discharge: 2015-07-24 | Disposition: A | Discharge status: Home / Self Care | Payer: Medicare HMO | Source: Ambulatory Visit | Attending: Radiation Oncology | Admitting: Radiation Oncology

## 2015-07-24 ENCOUNTER — Encounter: Payer: Self-pay | Admitting: Radiation Oncology

## 2015-07-24 ENCOUNTER — Encounter: Payer: Self-pay | Admitting: General Practice

## 2015-07-24 VITALS — BP 152/66 | HR 51 | Temp 98.3°F | Ht 62.0 in | Wt 170.7 lb

## 2015-07-24 DIAGNOSIS — C50411 Malignant neoplasm of upper-outer quadrant of right female breast: Secondary | ICD-10-CM

## 2015-07-24 NOTE — Progress Notes (Signed)
Counseling session  Second individual counseling session with client. Counselor used open question to check in with how client has been doing. Client reported that she has been feeling positive over the past week, but that she has been experiencing pain from her swollen arm that is full of fluid. Counselor used reflection of feeling and validation regarding client's discomfort she is experiencing. Counselor used open question inquiring how client has been feeling symptomatically. Client reported feeling like her depression and anxiety has been under control, and that she has been using coping strategies such as praying, being outside, reading, and writing. Counselor and client processed client's excitement about moving into a new house today, which she feels like will be a better and healthier environment. Counselor used open question to ask how this will be better. Client stated that the person she was living with was toxic, and also it was easier access to drugs. Counselor and client processed client's history around being clean, and explored how she was able to be clean in her past. Client reported it being "self-determination", support, and environmental factors that enabled being clean in the past. Counselor summarized aspects of last session and reminded client of goal of wanting to move towards a "better life". Counselor and client continued exploring what that could potentially mean for her, including new housing, continued educational development, and developing support system. Counselor will follow up with client in another session next week.   Wendall Papa, MS, [place], [place] [phone]

## 2015-07-24 NOTE — Progress Notes (Signed)
   Department of Radiation Oncology  Phone:  [phone] Fax:        [phone]  Weekly Treatment Note    Name: Marissa Brooks Date: 07/24/2015 MRN: [id number] DOB: March 06, 1956   Diagnosis:     ICD-9-CM ICD-10-CM   1. Breast cancer of upper-outer quadrant of right female breast s/p Right MRM 10/30/14 174.4 C50.411      Current dose: 39.8 Gy  Current fraction: 20   MEDICATIONS: Current Outpatient Prescriptions  Medication Sig Dispense Refill  . ARIPiprazole (ABILIFY) 5 MG tablet Take 1 tablet (5 mg total) by mouth daily. For mood control 30 tablet 0  . gabapentin (NEURONTIN) 100 MG capsule Take 1 capsule (100 mg total) by mouth 3 (three) times daily. 90 capsule 2  . hyaluronate sodium (RADIAPLEXRX) GEL Apply 1 application topically 2 (two) times daily.    Marland Kitchen lisinopril (PRINIVIL,ZESTRIL) 10 MG tablet Take 10 mg by mouth daily.    . non-metallic deodorant Jethro Poling) MISC Apply 1 application topically daily as needed. Reported on 07/10/2015    . sertraline (ZOLOFT) 100 MG tablet Take 1 tablet (100 mg total) by mouth at bedtime. For depression 30 tablet 0  . ondansetron (ZOFRAN) 8 MG tablet TAKE ONE TABLET   (8 MG TOTAL) BY MOUTH   TWICE A DAY   START THE DAY AFTER CHEMO FOR THREE   DAYS. THEN TAKE AS NEEDED   FOR NAUSEA OR VOMI (Patient not taking: Reported on 06/15/2015) 20 tablet 3  . pantoprazole (PROTONIX) 40 MG tablet Take 1 tablet (40 mg total) by mouth daily. (Patient not taking: Reported on 07/03/2015) 60 tablet 0  . traZODone (DESYREL) 100 MG tablet Take 1 tablet (100 mg total) by mouth at bedtime. For sleep (Patient not taking: Reported on 07/24/2015) 30 tablet 0   No current facility-administered medications for this encounter.     ALLERGIES: Centrum   LABORATORY DATA:  Lab Results  Component Value Date   WBC 3.4* 07/16/2015   HGB 12.2 07/16/2015   HCT 36.1 07/16/2015   MCV 83.8 07/16/2015   PLT 115* 07/16/2015   Lab Results  Component Value Date   NA 141 07/16/2015     K 4.1 07/16/2015   CL 116* 03/22/2015   CO2 26 07/16/2015   Lab Results  Component Value Date   ALT 56* 07/16/2015   AST 57* 07/16/2015   ALKPHOS 96 07/16/2015   BILITOT 0.33 07/16/2015     NARRATIVE: Marissa Brooks was seen today for weekly treatment management. The chart was checked and the patient's films were reviewed.  Marissa Brooks has completed 20 fractions to her right chest.  She denies having pain or fatigue.  The skin on her right chest has hyperpigmentation.  She is using radiaplex.  BP 152/66 mmHg  Pulse 51  Temp(Src) 98.3 F (36.8 C) (Oral)  Ht 5\' 2"  (1.575 m)  Wt 170 lb 11.2 oz (77.429 kg)  BMI 31.21 kg/m2  PHYSICAL EXAMINATION: height is 5\' 2"  (1.575 m) and weight is 170 lb 11.2 oz (77.429 kg). Her oral temperature is 98.3 F (36.8 C). Her blood pressure is 152/66 and her pulse is 51.      The patient's skin shows some mild hyperpigmentation. Overall looks very good.  ASSESSMENT: The patient is doing satisfactorily with treatment.  PLAN: We will continue with the patient's radiation treatment as planned.

## 2015-07-24 NOTE — Progress Notes (Signed)
Marissa Brooks has completed 20 fractions to her right chest.  She denies having pain or fatigue.  The skin on her right chest has hyperpigmentation.  She is using radiaplex.  BP 152/66 mmHg  Pulse 51  Temp(Src) 98.3 F (36.8 C) (Oral)  Ht 5\' 2"  (1.575 m)  Wt 170 lb 11.2 oz (77.429 kg)  BMI 31.21 kg/m2

## 2015-07-27 ENCOUNTER — Ambulatory Visit
Admission: RE | Admit: 2015-07-27 | Discharge: 2015-07-27 | Disposition: A | Discharge status: Home / Self Care | Payer: Medicare HMO | Source: Ambulatory Visit | Attending: Radiation Oncology | Admitting: Radiation Oncology

## 2015-07-27 DIAGNOSIS — C50411 Malignant neoplasm of upper-outer quadrant of right female breast: Secondary | ICD-10-CM | POA: Diagnosis not present

## 2015-07-28 ENCOUNTER — Ambulatory Visit: Payer: Medicare HMO

## 2015-07-28 ENCOUNTER — Ambulatory Visit
Admission: RE | Admit: 2015-07-28 | Discharge: 2015-07-28 | Disposition: A | Discharge status: Home / Self Care | Payer: Medicare HMO | Source: Ambulatory Visit | Attending: Radiation Oncology | Admitting: Radiation Oncology

## 2015-07-28 DIAGNOSIS — C50411 Malignant neoplasm of upper-outer quadrant of right female breast: Secondary | ICD-10-CM | POA: Diagnosis not present

## 2015-07-29 ENCOUNTER — Encounter: Payer: Self-pay | Admitting: Radiation Oncology

## 2015-07-29 ENCOUNTER — Ambulatory Visit
Admission: RE | Admit: 2015-07-29 | Discharge: 2015-07-29 | Disposition: A | Discharge status: Home / Self Care | Payer: Medicare HMO | Source: Ambulatory Visit | Attending: Radiation Oncology | Admitting: Radiation Oncology

## 2015-07-29 ENCOUNTER — Ambulatory Visit: Payer: Medicare HMO | Admitting: Physical Therapy

## 2015-07-29 DIAGNOSIS — C50411 Malignant neoplasm of upper-outer quadrant of right female breast: Secondary | ICD-10-CM

## 2015-07-29 MED ORDER — ALRA NON-METALLIC DEODORANT (RAD-ONC)
1.0000 "application " | Freq: Once | TOPICAL | Status: AC
  Administered 2015-07-29: 1 via TOPICAL

## 2015-07-29 NOTE — Progress Notes (Signed)
   Department of Radiation Oncology  Phone:  [phone] Fax:        [phone]  Weekly Treatment Note    Name: Marissa Brooks Date: 07/29/2015 MRN: [id number] DOB: 14-Aug-1955   Diagnosis:   No diagnosis found.   Current dose: 45.8 Gy  Current fraction: 23   MEDICATIONS: Current Outpatient Prescriptions  Medication Sig Dispense Refill  . ARIPiprazole (ABILIFY) 5 MG tablet Take 1 tablet (5 mg total) by mouth daily. For mood control 30 tablet 0  . gabapentin (NEURONTIN) 100 MG capsule Take 1 capsule (100 mg total) by mouth 3 (three) times daily. 90 capsule 2  . hyaluronate sodium (RADIAPLEXRX) GEL Apply 1 application topically 2 (two) times daily.    Marland Kitchen lisinopril (PRINIVIL,ZESTRIL) 10 MG tablet Take 10 mg by mouth daily.    . non-metallic deodorant Jethro Poling) MISC Apply 1 application topically daily as needed. Reported on 07/10/2015    . ondansetron (ZOFRAN) 8 MG tablet TAKE ONE TABLET   (8 MG TOTAL) BY MOUTH   TWICE A DAY   START THE DAY AFTER CHEMO FOR THREE   DAYS. THEN TAKE AS NEEDED   FOR NAUSEA OR VOMI (Patient not taking: Reported on 06/15/2015) 20 tablet 3  . pantoprazole (PROTONIX) 40 MG tablet Take 1 tablet (40 mg total) by mouth daily. (Patient not taking: Reported on 07/03/2015) 60 tablet 0  . sertraline (ZOLOFT) 100 MG tablet Take 1 tablet (100 mg total) by mouth at bedtime. For depression 30 tablet 0  . traZODone (DESYREL) 100 MG tablet Take 1 tablet (100 mg total) by mouth at bedtime. For sleep (Patient not taking: Reported on 07/24/2015) 30 tablet 0   No current facility-administered medications for this encounter.     ALLERGIES: Centrum   LABORATORY DATA:  Lab Results  Component Value Date   WBC 3.4* 07/16/2015   HGB 12.2 07/16/2015   HCT 36.1 07/16/2015   MCV 83.8 07/16/2015   PLT 115* 07/16/2015   Lab Results  Component Value Date   NA 141 07/16/2015   K 4.1 07/16/2015   CL 116* 03/22/2015   CO2 26 07/16/2015   Lab Results  Component Value Date   ALT 56* 07/16/2015   AST 57* 07/16/2015   ALKPHOS 96 07/16/2015   BILITOT 0.33 07/16/2015     NARRATIVE: Alejandro Mulling Goggins was seen today for weekly treatment management. The chart was checked and the patient's films were reviewed.  The patient is doing very well. No new issues this week. The skin has not become significantly irritated.  PHYSICAL EXAMINATION:   The patient's skin shows some mild hyperpigmentation. Overall looks very good.   ASSESSMENT: The patient is doing satisfactorily with treatment.  PLAN: We will continue with the patient's radiation treatment as planned.  She will begin her boost treatment in the near future which was set up today.    ------------------------------------------------  Jodelle Gross, MD, PhD    This document serves as a record of services personally performed by Kyung Rudd, MD. It was created on his behalf by  Lendon Collar, a trained medical scribe. The creation of this record is based on the scribe's personal observations and the provider's statements to them. This document has been checked and approved by the attending provider.

## 2015-07-30 ENCOUNTER — Ambulatory Visit
Admission: RE | Admit: 2015-07-30 | Discharge: 2015-07-30 | Disposition: A | Discharge status: Home / Self Care | Payer: Medicare HMO | Source: Ambulatory Visit | Attending: Radiation Oncology | Admitting: Radiation Oncology

## 2015-07-30 DIAGNOSIS — C50411 Malignant neoplasm of upper-outer quadrant of right female breast: Secondary | ICD-10-CM | POA: Diagnosis not present

## 2015-07-31 ENCOUNTER — Ambulatory Visit: Payer: Medicare HMO | Admitting: Physical Therapy

## 2015-07-31 ENCOUNTER — Ambulatory Visit
Admission: RE | Admit: 2015-07-31 | Discharge: 2015-07-31 | Disposition: A | Discharge status: Home / Self Care | Payer: Medicare HMO | Source: Ambulatory Visit | Attending: Radiation Oncology | Admitting: Radiation Oncology

## 2015-07-31 ENCOUNTER — Encounter: Payer: Self-pay | Admitting: Radiation Oncology

## 2015-07-31 ENCOUNTER — Encounter: Payer: Self-pay | Admitting: Physical Therapy

## 2015-07-31 ENCOUNTER — Encounter: Payer: Self-pay | Admitting: General Practice

## 2015-07-31 DIAGNOSIS — I890 Lymphedema, not elsewhere classified: Principal | ICD-10-CM

## 2015-07-31 DIAGNOSIS — M79601 Pain in right arm: Secondary | ICD-10-CM

## 2015-07-31 DIAGNOSIS — E8989 Other postprocedural endocrine and metabolic complications and disorders: Secondary | ICD-10-CM | POA: Diagnosis not present

## 2015-07-31 DIAGNOSIS — C50411 Malignant neoplasm of upper-outer quadrant of right female breast: Secondary | ICD-10-CM | POA: Diagnosis not present

## 2015-07-31 NOTE — Therapy (Signed)
[place], [place], [address] Phone: [phone]   Fax:  [phone]  Physical Therapy Treatment  Patient Details  Name: Marissa Brooks MRN: [id number] Date of Birth: 05-27-1955 Referring Provider: Shona Simpson, PA-C, Dr. Iran Planas  Encounter Date: 07/31/2015      PT End of Session - 07/31/15 1222    Visit Number 2   Number of Visits 24   Date for PT Re-Evaluation 09/17/15   PT Start Time 0937   PT Stop Time 1013   PT Time Calculation (min) 36 min   Activity Tolerance Patient tolerated treatment well   Behavior During Therapy [facility] for tasks assessed/performed      Past Medical History  Diagnosis Date  . Hypertension   . Shortness of breath dyspnea     with walking  . Pneumonia   . Anxiety   . Depression   . Bipolar disorder ([place])   . GERD (gastroesophageal reflux disease)   . Arthritis   . Cancer [facility]) 2002    left breast cancer tx in [place], right breast cancer 08/2014  . ETOH abuse     as of 10/21/14 none for a week  . Cocaine abuse     last use 10/13/14  . Breast cancer ([place]) 09/10/14    right breast,hx left breast ca,2002 [place]  . Allergy   . Tobacco abuse   . HCV (hepatitis C virus)     Past Surgical History  Procedure Laterality Date  . Mastectomy      and Implant  . Mandible fracture surgery    . Breast surgery  2002    left mastectomy with implant  . Tonsillectomy    . Mastectomy w/ sentinel node biopsy Right 10/30/2014  . Simple mastectomy with axillary sentinel node biopsy Right 10/30/2014    Procedure: RIGHT MASTECTOMY WITH SENTINEL NODE MAPPING;  Surgeon: Autumn Messing III, MD;  Location: [facility];  Service: General;  Laterality: Right;  . Esophagogastroduodenoscopy (egd) with propofol N/A 03/17/2015    Procedure: ESOPHAGOGASTRODUODENOSCOPY (EGD) WITH PROPOFOL;  Surgeon: Gatha Mayer, MD;  Location: [facility];  Service: Endoscopy;  Laterality: N/A;     There were no vitals filed for this visit.  Visit Diagnosis:  Post-lymphadenectomy lymphedema of arm  Right arm pain      Subjective Assessment - 07/31/15 0939    Subjective My arm is still the same. My treatments are going ok.    Pertinent History Bilateral mastectomies (left 2005 and right 10/30/14) with right SLNB.  Adjuvant chemotherapy 2016 and currently doing radiation for right chest.  Bipolar disorder managed with medication. Patient admits to alcohol and crack use.   Patient Stated Goals reduce right arm swelling.   Currently in Pain? No/denies   Pain Score 0-No pain               LYMPHEDEMA/ONCOLOGY QUESTIONNAIRE - 07/31/15 1223    Right Upper Extremity Lymphedema   10 cm Proximal to Olecranon Process 34 cm   10 cm Proximal to Ulnar Styloid Process 22.1 cm                  [facility] Adult PT Treatment/Exercise - 07/31/15 0001    Manual Therapy   Manual Therapy Manual Lymphatic Drainage (MLD)   Manual Lymphatic Drainage (MLD) In supine, short neck, superficial and deep abdominal, left axilla and left anterior interaxillary anastomosis, right groin and right axillo-inguinal anastomosis, right shoulder and right UE from shoulder to  fingers directing towards established anatomosis                   Short Term Clinic Goals - 07/31/15 1227    CC Short Term Goal  #1   Title Patient will verbalize understanding of lymphedema risk reduction practices.   Time 4   Period Weeks   Status On-going   CC Short Term Goal  #2   Title Patient will reduce right UE at 10 cm proximal to olecranon process to </= 32 cm.   Baseline 33.7 cm, 07/31/15- 34 cm   Time 4   Period Weeks   Status On-going   CC Short Term Goal  #3   Title Patient will reduce right UE at 10 cm proximal to ulnar styloid process to </= 22 cm.   Baseline 24.2, 07/31/15- 22.1 cm   Time 4   Period Weeks   Status On-going   CC Short Term Goal  #4   Title Patient will improve her lymphedema  life impact scale score to </= 24 for improved function   Baseline 29   Time 4   Period Weeks   Status On-going   CC Short Term Goal  #5   Title Patient will report >/= 25% improvement in right UE pain and tightness.   Time 4   Period Weeks   Status On-going             Long Term Clinic Goals - 07/23/15 1257    CC Long Term Goal  #1   Title Patient will verbalize understanding of where and how to be fitted for a compression sleeve and glove.   Time 8   Period Weeks   Status New   CC Long Term Goal  #2   Title Patient will reduce right UE at 10 cm proximal to olecranon process to </= 30 cm.   Baseline 33.7 cm   Time 8   Period Weeks   Status New   CC Long Term Goal  #3   Title Patient will reduce right UE at 10 cm proximal to ulnar styloid process process to </= 21 cm.   Baseline 24.2 cm   Time 8   Period Weeks   Status New   CC Long Term Goal  #4   Title Patient will improve her lymphedema life impact scale score to </= 20 for improved function   Baseline 29   Time 8   Period Weeks   Status New   CC Long Term Goal  #5   Title Patient will report >/= 50% improvement in right UE pain and tightness.   Time 8   Period Weeks   Status New            Plan - 07/31/15 1224    Clinical Impression Statement Pt missed her other appoitments this week because her transportation required a three day notice for appointmnets. She demonstrates increased fibrosis from right wrist to mid forearm with minimal skin mobility. Skin mobility improved some following MLD. Pt to be bandaged next session so she can be rechecked on Wednesday.   Pt will benefit from skilled therapeutic intervention in order to improve on the following deficits Increased edema;Pain;Decreased knowledge of precautions;Decreased range of motion;Impaired UE functional use   Rehab Potential Good   Clinical Impairments Affecting Rehab Potential Drug use and compliance concerns (pt canceled first 2 evals),  transportation   PT Frequency 3x / week   PT Duration 8 weeks  PT Treatment/Interventions Therapeutic exercise;Manual techniques;Patient/family education;Passive range of motion;Manual lymph drainage;ADLs/Self Care Home Management   PT Next Visit Plan continue MLD to RUE, compression bandaging    Consulted and Agree with Plan of Care Patient        Problem List Patient Active Problem List   Diagnosis Date Noted  . Peripheral neuropathy due to chemotherapy ([place]) 06/15/2015  . AKI (acute kidney injury) ([place])   . Esophagitis   . Leukocytosis 03/17/2015  . Acute esophagitis 03/17/2015  . HCAP (healthcare-associated pneumonia) 03/17/2015  . Hematemesis with nausea   . Acute blood loss anemia 03/16/2015  . Dysphagia 03/16/2015  . Hypocalcemia 03/16/2015  . GIB (gastrointestinal bleeding) 03/15/2015  . Tobacco abuse 03/15/2015  . ARF (acute renal failure) ([place]) 03/15/2015  . Hypokalemia 03/15/2015  . Depression 03/15/2015  . Hypertension   . GERD (gastroesophageal reflux disease)   . Anxiety   . Acute kidney injury ([place])   . Essential hypertension   . Gastroesophageal reflux disease without esophagitis   . Breast cancer of upper-outer quadrant of right female breast s/p Right MRM 10/30/14 10/30/2014  . Alcohol dependence ([place]) 07/09/2013  . MDD (major depressive disorder) ([place]) 07/09/2013  . Alcohol abuse with alcohol-induced mood disorder ([place]) 07/08/2013  . 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 OBESITY 05/13/2010  . TONGUE DISORDER 01/20/2010  . BACK PAIN, LUMBAR 09/03/2008  . THROMBOCYTOPENIA 06/08/2007  . HCV (hepatitis C virus) 06/07/2007  . BIPOLAR AFFECTIVE DISORDER 06/07/2007  . Substance abuse 06/07/2007  . PTSD 06/07/2007  . CONSTIPATION 06/07/2007  . HEPATIC CIRRHOSIS 06/07/2007  . UNSPECIFIED CONCUSSION 06/07/2007  . NEOPLASM, MALIGNANT, BREAST, HX OF 06/07/2007    Alexia Freestone 07/31/2015, 12:29 PM  [place], [place], [address] Phone: [phone]   Fax:  [phone]  Name: STANISLAWA HOJNACKI MRN: [id number] Date of Birth: 04/18/1956    Allyson Sabal, PT 07/31/2015 12:29 PM

## 2015-07-31 NOTE — Progress Notes (Signed)
Telephone  Counselor called client to re-schedule counseling appointment due to conflict in client's schedule. Client's phone was disconnected, so counselor wasn't able to leave a voicemail.   Marissa Papa, MS, [place], [place], [place], [place]

## 2015-08-03 ENCOUNTER — Ambulatory Visit: Payer: Medicare HMO | Admitting: Physical Therapy

## 2015-08-03 ENCOUNTER — Ambulatory Visit
Admission: RE | Admit: 2015-08-03 | Discharge: 2015-08-03 | Disposition: A | Discharge status: Home / Self Care | Payer: Medicare HMO | Source: Ambulatory Visit | Attending: Radiation Oncology | Admitting: Radiation Oncology

## 2015-08-03 ENCOUNTER — Ambulatory Visit: Payer: Medicare HMO

## 2015-08-03 DIAGNOSIS — C50411 Malignant neoplasm of upper-outer quadrant of right female breast: Secondary | ICD-10-CM | POA: Diagnosis not present

## 2015-08-04 ENCOUNTER — Other Ambulatory Visit: Payer: Self-pay | Admitting: *Deleted

## 2015-08-04 ENCOUNTER — Ambulatory Visit (HOSPITAL_BASED_OUTPATIENT_CLINIC_OR_DEPARTMENT_OTHER): Payer: Medicare HMO | Admitting: Nurse Practitioner

## 2015-08-04 ENCOUNTER — Ambulatory Visit
Admission: RE | Admit: 2015-08-04 | Discharge: 2015-08-04 | Disposition: A | Discharge status: Home / Self Care | Payer: Medicare HMO | Source: Ambulatory Visit | Attending: Radiation Oncology | Admitting: Radiation Oncology

## 2015-08-04 ENCOUNTER — Encounter: Payer: Self-pay | Admitting: Nurse Practitioner

## 2015-08-04 DIAGNOSIS — I890 Lymphedema, not elsewhere classified: Secondary | ICD-10-CM

## 2015-08-04 DIAGNOSIS — M25511 Pain in right shoulder: Secondary | ICD-10-CM

## 2015-08-04 DIAGNOSIS — C50411 Malignant neoplasm of upper-outer quadrant of right female breast: Secondary | ICD-10-CM

## 2015-08-04 DIAGNOSIS — L258 Unspecified contact dermatitis due to other agents: Secondary | ICD-10-CM

## 2015-08-04 DIAGNOSIS — L589 Radiodermatitis, unspecified: Secondary | ICD-10-CM

## 2015-08-04 HISTORY — DX: Lymphedema, not elsewhere classified: I89.0

## 2015-08-04 NOTE — Assessment & Plan Note (Signed)
Patient has developed chronic right upper extremity lymphedema from her previous right mastectomy with lymph node dissection.  She is undergoing physical therapy with the lymphedema clinic on a 3 time per week basis.  She is also awaiting the fitting of a lymphedema compression sleeve as well.

## 2015-08-04 NOTE — Progress Notes (Signed)
SYMPTOM MANAGEMENT CLINIC   HPI: Marissa Brooks 60 y.o. female diagnosed with breast cancer.  Patient is status post mastectomy, chemotherapy, and is currently undergoing radiation treatments.   Patient completed chemotherapy in October 2016; and is currently in the midst of radiation treatments to the right breast/chest wall area.  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  She is currently undergoing physical therapy for treatment of lymphedema on a Monday/Wednesday/Friday basis.  She is awaiting the fitting of a lymphedema sleeve as well.  Patient reports chronic right breast and right posterior shoulder region discomfort secondary to her cancer diagnosis and current radiation treatments.  She has increased her Neurontin from 100 mg 3 times per day up to 200 mg 3 times per day.  She states that the Neurontin has been ineffective in managing her pain; but the increased Neurontin has made her a little more sleepy.  Patient denies any chest pain, chest pressure, shortness breath, or pain with inspiration.  Chills denies any recent fevers or chills.  Patient has a history of polysubstance abuse in the past; and would prefer to avoid any narcotic pain medications.  Exam today reveals right postsurgical mastectomy site that is well-healed.  The skin surrounding the radiation site is darkened; there is some radiation dermatitis with open areas of skin below the mastectomy site where her bra line lies.  Right posterior shoulder region with some tenderness with palpation; but of cervix with full range of motion.  Patient does have some moderate lymphedema to her entire right upper extremity.  There is no evidence of infection to the entire region.  Reviewed all findings with Dr. Burr Medico in detail; and then advised patient to continue taking the Neurontin 200 mg 3 times per day.  Patient may actually increase the Neurontin to 300 mg at bedtime to see if this helps.   Also, patient may alternate Tylenol and ibuprofen as well.  Patient was also encouraged to continue with her physical therapy 3 times per week as well.  Hopefully, using the lymphedema sleeve will also help with her lymphedema discomfort as well.  Also, patient was advised to use the creams that were supplied by radiation oncology as well.  She should apply the screens after she has finished radiation for the day.  She should also avoid wearing her bra since it lies directly over the areas of open skin at all possible.  Patient was advised to call/return or go directly to the emergency department for any worsening symptoms whatsoever.  HPI  Review of Systems  Musculoskeletal: Positive for joint pain.  Skin: Positive for rash.  All other systems reviewed and are negative.   Past Medical History  Diagnosis Date  . Hypertension   . Shortness of breath dyspnea     with walking  . Pneumonia   . Anxiety   . Depression   . Bipolar disorder ([place])   . GERD (gastroesophageal reflux disease)   . Arthritis   . Cancer [facility]) 2002    left breast cancer tx in [place], right breast cancer 08/2014  . ETOH abuse     as of 10/21/14 none for a week  . Cocaine abuse     last use 10/13/14  . Breast cancer ([place]) 09/10/14    right breast,hx left breast ca,2002 [place]  . Allergy   . Tobacco abuse   . HCV (hepatitis C virus)     Past Surgical History  Procedure Laterality  Date  . Mastectomy      and Implant  . Mandible fracture surgery    . Breast surgery  2002    left mastectomy with implant  . Tonsillectomy    . Mastectomy w/ sentinel node biopsy Right 10/30/2014  . Simple mastectomy with axillary sentinel node biopsy Right 10/30/2014    Procedure: RIGHT MASTECTOMY WITH SENTINEL NODE MAPPING;  Surgeon: Autumn Messing III, MD;  Location: [facility];  Service: General;  Laterality: Right;  . Esophagogastroduodenoscopy (egd) with propofol N/A 03/17/2015    Procedure: ESOPHAGOGASTRODUODENOSCOPY  (EGD) WITH PROPOFOL;  Surgeon: Gatha Mayer, MD;  Location: [facility];  Service: Endoscopy;  Laterality: N/A;    has HCV (hepatitis C virus); THROMBOCYTOPENIA; BIPOLAR AFFECTIVE DISORDER; Substance abuse; PTSD; TONGUE DISORDER; CONSTIPATION; HEPATIC CIRRHOSIS; BACK PAIN, LUMBAR; UNSPECIFIED CONCUSSION; NEOPLASM, MALIGNANT, BREAST, HX OF; OBESITY; Papilloma of breast; Alcohol abuse with alcohol-induced mood disorder ([place]); Alcohol dependence ([place]); MDD (major depressive disorder) ([place]); Breast cancer of upper-outer quadrant of right female breast s/p Right MRM 10/30/14; GIB (gastrointestinal bleeding); Tobacco abuse; Hypertension; GERD (gastroesophageal reflux disease); Anxiety; ARF (acute renal failure) ([place]); Hypokalemia; Acute kidney injury ([place]); Essential hypertension; Gastroesophageal reflux disease without esophagitis; Depression; Acute blood loss anemia; Dysphagia; Hypocalcemia; Leukocytosis; Acute esophagitis; Hematemesis with nausea; HCAP (healthcare-associated pneumonia); AKI (acute kidney injury) ([employer]); Esophagitis; Peripheral neuropathy due to chemotherapy ([place]); Lymphedema of upper extremity; Shoulder pain, right; and Radiation dermatitis on her problem list.    is allergic to centrum.    Medication List       This list is accurate as of: 08/04/15 12:15 PM.  Always use your most recent med list.               ARIPiprazole 5 MG tablet  Commonly known as:  ABILIFY  Take 1 tablet (5 mg total) by mouth daily. For mood control     gabapentin 100 MG capsule  Commonly known as:  NEURONTIN  Take 1 capsule (100 mg total) by mouth 3 (three) times daily.     hyaluronate sodium Gel  Apply 1 application topically 2 (two) times daily.     lisinopril 10 MG tablet  Commonly known as:  PRINIVIL,ZESTRIL  Take 10 mg by mouth daily.     non-metallic deodorant Misc  Commonly known as:  ALRA  Apply 1 application topically daily as needed. Reported on 07/10/2015     ondansetron 8 MG tablet   Commonly known as:  ZOFRAN  TAKE ONE TABLET   (8 MG TOTAL) BY MOUTH   TWICE A DAY   START THE DAY AFTER CHEMO FOR THREE   DAYS. THEN TAKE AS NEEDED   FOR NAUSEA OR VOMI     pantoprazole 40 MG tablet  Commonly known as:  PROTONIX  Take 1 tablet (40 mg total) by mouth daily.     sertraline 100 MG tablet  Commonly known as:  ZOLOFT  Take 1 tablet (100 mg total) by mouth at bedtime. For depression     traZODone 100 MG tablet  Commonly known as:  DESYREL  Take 1 tablet (100 mg total) by mouth at bedtime. For sleep         PHYSICAL EXAMINATION  Oncology Vitals 07/24/2015 07/17/2015  Height 158 cm 158 cm  Weight 77.429 kg 78.019 kg  Weight (lbs) 170 lbs 11 oz 172 lbs  BMI (kg/m2) 31.22 kg/m2 31.46 kg/m2  Temp 98.3 98.1  Pulse 51 51  Resp - -  SpO2 - -  BSA (m2)  1.84 m2 1.85 m2   BP Readings from Last 2 Encounters:  07/24/15 152/66  07/17/15 139/74    Physical Exam  Constitutional: She is oriented to person, place, and time and well-developed, well-nourished, and in no distress.  HENT:  Head: Normocephalic and atraumatic.  Eyes: Conjunctivae and EOM are normal. Pupils are equal, round, and reactive to light. Right eye exhibits no discharge. Left eye exhibits no discharge. No scleral icterus.  Neck: Normal range of motion. Neck supple.  Pulmonary/Chest: Effort normal. No respiratory distress. She exhibits no tenderness.  Musculoskeletal: Normal range of motion. She exhibits edema. She exhibits no tenderness.  Tenderness to the right posterior shoulder area.  Observed with full range of motion.  Neurological: She is alert and oriented to person, place, and time. Gait normal.  Skin: Skin is warm and dry. Rash noted. There is erythema. No pallor.  Patient has moderate lymphedema to the right upper extremity.  She also has a healed right mastectomy site with no evidence of infection.  The entire radiation field is darkened.  Patient has some mild radiation dermatitis with some open  areas at her bra line to the right chest.  Psychiatric: Affect normal.    LABORATORY DATA:. No visits with results within 3 Day(s) from this visit. Latest known visit with results is:  Appointment on 07/16/2015  Component Date Value Ref Range Status  . WBC 07/16/2015 3.4* 3.9 - 10.3 10e3/uL Final  . NEUT# 07/16/2015 2.2  1.5 - 6.5 10e3/uL Final  . HGB 07/16/2015 12.2  11.6 - 15.9 g/dL Final  . HCT 07/16/2015 36.1  34.8 - 46.6 % Final  . Platelets 07/16/2015 115* 145 - 400 10e3/uL Final  . MCV 07/16/2015 83.8  79.5 - 101.0 fL Final  . MCH 07/16/2015 28.3  25.1 - 34.0 pg Final  . MCHC 07/16/2015 33.8  31.5 - 36.0 g/dL Final  . RBC 07/16/2015 4.31  3.70 - 5.45 10e6/uL Final  . RDW 07/16/2015 13.8  11.2 - 14.5 % Final  . lymph# 07/16/2015 0.7* 0.9 - 3.3 10e3/uL Final  . MONO# 07/16/2015 0.3  0.1 - 0.9 10e3/uL Final  . Eosinophils Absolute 07/16/2015 0.2  0.0 - 0.5 10e3/uL Final  . Basophils Absolute 07/16/2015 0.0  0.0 - 0.1 10e3/uL Final  . NEUT% 07/16/2015 63.6  38.4 - 76.8 % Final  . LYMPH% 07/16/2015 21.0  14.0 - 49.7 % Final  . MONO% 07/16/2015 9.2  0.0 - 14.0 % Final  . EOS% 07/16/2015 5.6  0.0 - 7.0 % Final  . BASO% 07/16/2015 0.6  0.0 - 2.0 % Final  . Retic % 07/16/2015 1.26  0.70 - 2.10 % Final  . Retic Ct Abs 07/16/2015 54.31  33.70 - 90.70 10e3/uL Final  . Immature Retic Fract 07/16/2015 1.90  1.60 - 10.00 % Final  . Sodium 07/16/2015 141  136 - 145 mEq/L Final  . Potassium 07/16/2015 4.1  3.5 - 5.1 mEq/L Final  . Chloride 07/16/2015 108  98 - 109 mEq/L Final  . CO2 07/16/2015 26  22 - 29 mEq/L Final  . Glucose 07/16/2015 74  70 - 140 mg/dl Final   Glucose reference range is for nonfasting patients. Fasting glucose reference range is 70- 100.  Marland Kitchen BUN 07/16/2015 13.4  7.0 - 26.0 mg/dL Final  . Creatinine 07/16/2015 0.9  0.6 - 1.1 mg/dL Final  . Total Bilirubin 07/16/2015 0.33  0.20 - 1.20 mg/dL Final  . Alkaline Phosphatase 07/16/2015 96  40 - 150 U/L Final  .  AST  07/16/2015 57* 5 - 34 U/L Final  . ALT 07/16/2015 56* 0 - 55 U/L Final  . Total Protein 07/16/2015 7.3  6.4 - 8.3 g/dL Final  . Albumin 07/16/2015 3.8  3.5 - 5.0 g/dL Final  . Calcium 07/16/2015 9.4  8.4 - 10.4 mg/dL Final  . Anion Gap 07/16/2015 7  3 - 11 mEq/L Final  . EGFR 07/16/2015 84* >90 ml/min/1.73 m2 Final   eGFR is calculated using the CKD-EPI Creatinine Equation (2009)       RADIOGRAPHIC STUDIES: No results found.  ASSESSMENT/PLAN:    Shoulder pain, right Patient completed chemotherapy in October 2016; and is currently in the midst of radiation treatments to the right breast/chest wall area.  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  She is currently undergoing physical therapy for treatment of lymphedema on a Monday/Wednesday/Friday basis.  She is awaiting the fitting of a lymphedema sleeve as well.  Patient reports chronic right breast and right posterior shoulder region discomfort secondary to her cancer diagnosis and current radiation treatments.  She has increased her Neurontin from 100 mg 3 times per day up to 200 mg 3 times per day.  She states that the Neurontin has been ineffective in managing her pain; but the increased Neurontin has made her a little more sleepy.  Patient denies any chest pain, chest pressure, shortness breath, or pain with inspiration.  Chills denies any recent fevers or chills.  Patient has a history of polysubstance abuse in the past; and would prefer to avoid any narcotic pain medications.  Exam today reveals right postsurgical mastectomy site that is well-healed.  The skin surrounding the radiation site is darkened; there is some radiation dermatitis with open areas of skin below the mastectomy site where her bra line lies.  Right posterior shoulder region with some tenderness with palpation; but of cervix with full range of motion.  Patient does have some moderate lymphedema to her entire right upper  extremity.  There is no evidence of infection to the entire region.  Reviewed all findings with Dr. Burr Medico in detail; and then advised patient to continue taking the Neurontin 200 mg 3 times per day.  Patient may actually increase the Neurontin to 300 mg at bedtime to see if this helps.  Also, patient may alternate Tylenol and ibuprofen as well.  Patient was also encouraged to continue with her physical therapy 3 times per week as well.  Hopefully, using the lymphedema sleeve will also help with her lymphedema discomfort as well.  Also, patient was advised to use the creams that were supplied by radiation oncology as well.  She should apply the screens after she has finished radiation for the day.  She should also avoid wearing her bra since it lies directly over the areas of open skin at all possible.  Patient was advised to call/return or go directly to the emergency department for any worsening symptoms whatsoever.  Radiation dermatitis Patient continues with daily radiation treatments to the right breast/mastectomy site/chest area.  She has some mild radiation dermatitis at her right mastectomy site. The rest of the mastectomy site is darkened from radiation treatments.  Patient states she already has the creams that were supplied by radiation oncology.  Apply to her skin when she returns home.  Patient was encouraged to avoid wearing a bra at all possible to allow for further healing as well.  Lymphedema of upper extremity Patient has developed chronic right upper  extremity lymphedema from her previous right mastectomy with lymph node dissection.  She is undergoing physical therapy with the lymphedema clinic on a 3 time per week basis.  She is also awaiting the fitting of a lymphedema compression sleeve as well.  Breast cancer of upper-outer quadrant of right female breast s/p Right MRM 10/30/14 Patient is status post bilateral mastectomy.  She has a left breast implant.  She finished her last  Taxotere/Cytoxan chemotherapy in October 2016.  She initiated radiation treatments on 06/26/2015.  She states that she will be receiving her final radiation treatment on 08/12/2015.  See further notes for details of today's visit.  Patient is scheduled to return on 08/20/2015 for labs and a follow-up visit.  She knows to call in the interim for any new worries or concerns whatsoever.  Patient stated understanding of all instructions; and was in agreement with this plan of care. The patient knows to call the clinic with any problems, questions or concerns.   Review/collaboration with Dr. Burr Medico regarding all aspects of patient's visit today.   Total time spent with patient was 25 minutes;  with greater than 75 percent of that time spent in face to face counseling regarding patient's symptoms,  and coordination of care and follow up.  Disclaimer:This dictation was prepared with Dragon/digital dictation along with [employer]. Any transcriptional errors that result from this process are unintentional.  Drue Second, NP 08/04/2015

## 2015-08-04 NOTE — Assessment & Plan Note (Signed)
Patient completed chemotherapy in October 2016; and is currently in the midst of radiation treatments to the right breast/chest wall area.  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  She is currently undergoing physical therapy for treatment of lymphedema on a Monday/Wednesday/Friday basis.  She is awaiting the fitting of a lymphedema sleeve as well.  Patient reports chronic right breast and right posterior shoulder region discomfort secondary to her cancer diagnosis and current radiation treatments.  She has increased her Neurontin from 100 mg 3 times per day up to 200 mg 3 times per day.  She states that the Neurontin has been ineffective in managing her pain; but the increased Neurontin has made her a little more sleepy.  Patient denies any chest pain, chest pressure, shortness breath, or pain with inspiration.  Chills denies any recent fevers or chills.  Patient has a history of polysubstance abuse in the past; and would prefer to avoid any narcotic pain medications.  Exam today reveals right postsurgical mastectomy site that is well-healed.  The skin surrounding the radiation site is darkened; there is some radiation dermatitis with open areas of skin below the mastectomy site where her bra line lies.  Right posterior shoulder region with some tenderness with palpation; but of cervix with full range of motion.  Patient does have some moderate lymphedema to her entire right upper extremity.  There is no evidence of infection to the entire region.  Reviewed all findings with Dr. Burr Medico in detail; and then advised patient to continue taking the Neurontin 200 mg 3 times per day.  Patient may actually increase the Neurontin to 300 mg at bedtime to see if this helps.  Also, patient may alternate Tylenol and ibuprofen as well.  Patient was also encouraged to continue with her physical therapy 3 times per week as well.  Hopefully, using the lymphedema sleeve will also help  with her lymphedema discomfort as well.  Also, patient was advised to use the creams that were supplied by radiation oncology as well.  She should apply the screens after she has finished radiation for the day.  She should also avoid wearing her bra since it lies directly over the areas of open skin at all possible.  Patient was advised to call/return or go directly to the emergency department for any worsening symptoms whatsoever.

## 2015-08-04 NOTE — Assessment & Plan Note (Addendum)
Patient continues with daily radiation treatments to the right breast/mastectomy site/chest area.  She has some mild radiation dermatitis at her right mastectomy site. The rest of the mastectomy site is darkened from radiation treatments.  Patient states she already has the creams that were supplied by radiation oncology.  Apply to her skin when she returns home.  Patient was encouraged to avoid wearing a bra at all possible to allow for further healing as well.

## 2015-08-04 NOTE — Assessment & Plan Note (Signed)
Patient is status post bilateral mastectomy.  She has a left breast implant.  She finished her last Taxotere/Cytoxan chemotherapy in October 2016.  She initiated radiation treatments on 06/26/2015.  She states that she will be receiving her final radiation treatment on 08/12/2015.  See further notes for details of today's visit.  Patient is scheduled to return on 08/20/2015 for labs and a follow-up visit.  She knows to call in the interim for any new worries or concerns whatsoever.

## 2015-08-05 ENCOUNTER — Ambulatory Visit: Payer: Medicare HMO | Admitting: Physical Therapy

## 2015-08-05 ENCOUNTER — Ambulatory Visit
Admission: RE | Admit: 2015-08-05 | Discharge: 2015-08-05 | Disposition: A | Discharge status: Home / Self Care | Payer: Medicare HMO | Source: Ambulatory Visit | Attending: Radiation Oncology | Admitting: Radiation Oncology

## 2015-08-05 DIAGNOSIS — E8989 Other postprocedural endocrine and metabolic complications and disorders: Secondary | ICD-10-CM | POA: Diagnosis not present

## 2015-08-05 DIAGNOSIS — M79601 Pain in right arm: Secondary | ICD-10-CM

## 2015-08-05 DIAGNOSIS — C50411 Malignant neoplasm of upper-outer quadrant of right female breast: Secondary | ICD-10-CM | POA: Diagnosis not present

## 2015-08-05 DIAGNOSIS — I890 Lymphedema, not elsewhere classified: Principal | ICD-10-CM

## 2015-08-05 NOTE — Patient Instructions (Signed)
Neck Side-Bending   Begin with chin level, head centered over spine. Slowly lower one ear toward shoulder keeping shoulder down. Hold __3__ seconds. Slowly return to starting position. Repeat to other side.  Repeat __5-10__ times to each side. Do 2-3 times a day. Also stretch by looking over right shoulder, then left.  Scapular Retraction (Standing)   With arms at [place], pinch shoulder blades together.  Hold 3 seconds. Repeat __5-10__ times per set.  Do __2-3__ sessions per day.   Active ROM Flexion    Raise arm to point to ceiling, keeping elbow straight. Hold __3__ seconds. Repeat _5-10___ times. Do _2-3___ sessions per day. Activity: Use this motion to reach for items on overhead shelves.     Flexion (Active)   Palm up, rest elbow on other hand. Bend as far as possible. Hold __3__ seconds. Repeat __5-10__ times. Do _2-3___ sessions per day. Can use other hand to bend elbow further if needed where bandage may limit end motion.  Copyright  VHI. All rights reserved.    Extension (Active With Finger Extension)   Llift hand with fingers straight, then bend wrist down.  Hold _3___ seconds each way. Repeat __5-10__ times. Do _2-3___ sessions per day.    AROM: Forearm Pronation / Supination   With right arm in handshake position, slowly rotate palm down. Relax. Then rotate palm up. Repeat __5-10__ times per set. Do __2-3__ sessions per day.  Copyright  VHI. All rights reserved.    AROM: Finger Flexion / Extension   Actively bend fingers of right hand. Start with knuckles furthest from palm, and slowly make a fist. Hold __3__ seconds. Relax. Then straighten fingers as far as possible. Repeat _5-10___ times per set.  Do __2-3__ sessions per day. Also spread fingers as far as able, then bring them back together.  Copyright  VHI. All rights reserved.   If any pain/tingling symptoms occur, try all of these exercises first to promote circulation. If symptoms do not  ease off, then remove bandages and bring all wrappings back to next appointment.    

## 2015-08-05 NOTE — Therapy (Signed)
[place], [place], [address] Phone: [phone]   Fax:  [phone]  Physical Therapy Treatment  Patient Details  Name: Marissa Brooks MRN: [id number] Date of Birth: Oct 15, 1955 Referring Provider: Shona Simpson, PA-C, Dr. Iran Planas  Encounter Date: 08/05/2015      PT End of Session - 08/05/15 1458    Visit Number 3   Number of Visits 24   Date for PT Re-Evaluation 09/17/15   PT Start Time 1018   PT Stop Time 1102   PT Time Calculation (min) 44 min   Activity Tolerance Patient tolerated treatment well   Behavior During Therapy [facility] for tasks assessed/performed      Past Medical History  Diagnosis Date  . Hypertension   . Shortness of breath dyspnea     with walking  . Pneumonia   . Anxiety   . Depression   . Bipolar disorder ([place])   . GERD (gastroesophageal reflux disease)   . Arthritis   . Cancer [facility]) 2002    left breast cancer tx in [place], right breast cancer 08/2014  . ETOH abuse     as of 10/21/14 none for a week  . Cocaine abuse     last use 10/13/14  . Breast cancer ([place]) 09/10/14    right breast,hx left breast ca,2002 [place]  . Allergy   . Tobacco abuse   . HCV (hepatitis C virus)     Past Surgical History  Procedure Laterality Date  . Mastectomy      and Implant  . Mandible fracture surgery    . Breast surgery  2002    left mastectomy with implant  . Tonsillectomy    . Mastectomy w/ sentinel node biopsy Right 10/30/2014  . Simple mastectomy with axillary sentinel node biopsy Right 10/30/2014    Procedure: RIGHT MASTECTOMY WITH SENTINEL NODE MAPPING;  Surgeon: Autumn Messing III, MD;  Location: [facility];  Service: General;  Laterality: Right;  . Esophagogastroduodenoscopy (egd) with propofol N/A 03/17/2015    Procedure: ESOPHAGOGASTRODUODENOSCOPY (EGD) WITH PROPOFOL;  Surgeon: Gatha Mayer, MD;  [facility];  Service: Endoscopy;  Laterality: N/A;     There were no vitals filed for this visit.  Visit Diagnosis:  Post-lymphadenectomy lymphedema of arm  Right arm pain      Subjective Assessment - 08/05/15 1022    Subjective Patient reports she has moved and she likes where she is now.  Thinks the arm is a little better.   Currently in Pain? No/denies               LYMPHEDEMA/ONCOLOGY QUESTIONNAIRE - 08/05/15 1024    Right Upper Extremity Lymphedema   10 cm Proximal to Olecranon Process 33.5 cm   Olecranon Process 28.2 cm   10 cm Proximal to Ulnar Styloid Process 23.4 cm   Just Proximal to Ulnar Styloid Process 18.6 cm   Across Hand at [employer] 19.6 cm   At [place] of 2nd Digit 6.2 cm                  [facility] Adult PT Treatment/Exercise - 08/05/15 0001    Manual Therapy   Manual Therapy Edema management;Compression Bandaging   Edema Management circumference measurements taken   Manual Lymphatic Drainage (MLD) In supine, short neck, diaphragmatic breathing, superficial abdominal, left axilla and left anterior interaxillary anastomosis, right groin and right axillo-inguinal anastomosis, right UE from shoulder to hand directing towards established anatomosis  Compression Bandaging right UE bandaged with thick stockinette, Elastomull to all five fingers, Artiflex x 1, and 1-6 cm., 1-10 cm., and 1-12 cm. bandage from hand to shoulder.                PT Education - 08/05/15 1251    Education provided Yes   Education Details in right UE ROM to be done several times a day and in case of pain or paresthesias; also to remove bandages if pain or paresthesias are not relieved with exercise   Person(s) Educated Patient   Methods Explanation;Demonstration;Handout   Comprehension Verbalized understanding;Returned demonstration           Short Term Clinic Goals - 08/05/15 1502    CC Short Term Goal  #1   Title Patient will verbalize understanding of lymphedema risk reduction practices.   Status  On-going   CC Short Term Goal  #2   Title Patient will reduce right UE at 10 cm proximal to olecranon process to </= 32 cm.   Status On-going   CC Short Term Goal  #3   Title Patient will reduce right UE at 10 cm proximal to ulnar styloid process to </= 22 cm.   Status On-going   CC Short Term Goal  #4   Title Patient will improve her lymphedema life impact scale score to </= 24 for improved function   Status On-going   CC Short Term Goal  #5   Title Patient will report >/= 25% improvement in right UE pain and tightness.   Status On-going             Long Term Clinic Goals - 07/23/15 1257    CC Long Term Goal  #1   Title Patient will verbalize understanding of where and how to be fitted for a compression sleeve and glove.   Time 8   Period Weeks   Status New   CC Long Term Goal  #2   Title Patient will reduce right UE at 10 cm proximal to olecranon process to </= 30 cm.   Baseline 33.7 cm   Time 8   Period Weeks   Status New   CC Long Term Goal  #3   Title Patient will reduce right UE at 10 cm proximal to ulnar styloid process process to </= 21 cm.   Baseline 24.2 cm   Time 8   Period Weeks   Status New   CC Long Term Goal  #4   Title Patient will improve her lymphedema life impact scale score to </= 20 for improved function   Baseline 29   Time 8   Period Weeks   Status New   CC Long Term Goal  #5   Title Patient will report >/= 50% improvement in right UE pain and tightness.   Time 8   Period Weeks   Status New            Plan - 08/05/15 1459    Clinical Impression Statement Sounds like she has transportation worked out and expects to be here more regularly, so bandaging was started today.  Patient understood to avoid getting bandages wet and understood other instructions given today.  Bandages were comfortable when she left here.  TG soft was used instead of stockinette so that if bandages need to be removed she could continue to wear that to provide mild  compression.     Pt will benefit from skilled therapeutic intervention in order to improve on  the following deficits Increased edema;Pain;Decreased knowledge of precautions;Decreased range of motion;Impaired UE functional use   Rehab Potential Good   Clinical Impairments Affecting Rehab Potential Drug use and compliance concerns (pt canceled first 2 evals), transportation   PT Frequency 3x / week   PT Duration 8 weeks   PT Treatment/Interventions Manual lymph drainage;Compression bandaging;Patient/family education;ADLs/Self Care Home Management   PT Next Visit Plan check tolerance/results from first bandaging session; continue complete decongestive therapy   PT Home Exercise Plan remedial exercises for lymphedema   Consulted and Agree with Plan of Care Patient        Problem List Patient Active Problem List   Diagnosis Date Noted  . Lymphedema of upper extremity 08/04/2015  . Shoulder pain, right 08/04/2015  . Radiation dermatitis 08/04/2015  . 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 AKI (acute kidney injury) ([place])   . Esophagitis   . Leukocytosis 03/17/2015  . Acute esophagitis 03/17/2015  . HCAP (healthcare-associated pneumonia) 03/17/2015  . Hematemesis with nausea   . Acute blood loss anemia 03/16/2015  . Dysphagia 03/16/2015  . Hypocalcemia 03/16/2015  . GIB (gastrointestinal bleeding) 03/15/2015  . Tobacco abuse 03/15/2015  . ARF (acute renal failure) ([place]) 03/15/2015  . Hypokalemia 03/15/2015  . Depression 03/15/2015  . Hypertension   . GERD (gastroesophageal reflux disease)   . Anxiety   . Acute kidney injury ([place])   . Essential hypertension   . Gastroesophageal reflux disease without esophagitis   . Breast cancer of upper-outer quadrant of right female breast s/p Right MRM 10/30/14 10/30/2014  . Alcohol dependence ([place]) 07/09/2013  . MDD (major depressive disorder) ([place]) 07/09/2013  . Alcohol abuse with alcohol-induced mood disorder ([place])  07/08/2013  . 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 OBESITY 05/13/2010  . TONGUE DISORDER 01/20/2010  . BACK PAIN, LUMBAR 09/03/2008  . THROMBOCYTOPENIA 06/08/2007  . HCV (hepatitis C virus) 06/07/2007  . BIPOLAR AFFECTIVE DISORDER 06/07/2007  . Substance abuse 06/07/2007  . PTSD 06/07/2007  . CONSTIPATION 06/07/2007  . HEPATIC CIRRHOSIS 06/07/2007  . UNSPECIFIED CONCUSSION 06/07/2007  . NEOPLASM, MALIGNANT, BREAST, HX OF 06/07/2007    Cordarious Zeek 08/05/2015, 3:03 PM  [place], [place], [address] Phone: [phone]   Fax:  [phone]  Name: RUWAIDA WILDING MRN: [id number] Date of Birth: 1955/11/25    Serafina Royals, PT 08/05/2015 3:03 PM

## 2015-08-06 ENCOUNTER — Ambulatory Visit
Admission: RE | Admit: 2015-08-06 | Discharge: 2015-08-06 | Disposition: A | Discharge status: Home / Self Care | Payer: Medicare HMO | Source: Ambulatory Visit | Attending: Radiation Oncology | Admitting: Radiation Oncology

## 2015-08-06 ENCOUNTER — Ambulatory Visit: Payer: Medicare HMO

## 2015-08-06 ENCOUNTER — Ambulatory Visit: Payer: Medicare HMO | Admitting: Radiation Oncology

## 2015-08-06 DIAGNOSIS — C50411 Malignant neoplasm of upper-outer quadrant of right female breast: Secondary | ICD-10-CM | POA: Diagnosis not present

## 2015-08-07 ENCOUNTER — Encounter: Payer: Self-pay | Admitting: Radiation Oncology

## 2015-08-07 ENCOUNTER — Ambulatory Visit
Admission: RE | Admit: 2015-08-07 | Discharge: 2015-08-07 | Disposition: A | Discharge status: Home / Self Care | Payer: Medicare HMO | Source: Ambulatory Visit | Attending: Radiation Oncology | Admitting: Radiation Oncology

## 2015-08-07 ENCOUNTER — Ambulatory Visit: Payer: Medicare HMO

## 2015-08-07 ENCOUNTER — Ambulatory Visit: Payer: Medicare HMO | Admitting: Physical Therapy

## 2015-08-07 VITALS — BP 136/79 | HR 72 | Temp 97.8°F | Resp 16 | Wt 165.6 lb

## 2015-08-07 DIAGNOSIS — M79601 Pain in right arm: Secondary | ICD-10-CM

## 2015-08-07 DIAGNOSIS — I890 Lymphedema, not elsewhere classified: Principal | ICD-10-CM

## 2015-08-07 DIAGNOSIS — E8989 Other postprocedural endocrine and metabolic complications and disorders: Secondary | ICD-10-CM | POA: Diagnosis not present

## 2015-08-07 DIAGNOSIS — C50411 Malignant neoplasm of upper-outer quadrant of right female breast: Secondary | ICD-10-CM | POA: Diagnosis not present

## 2015-08-07 NOTE — Progress Notes (Signed)
Department of Radiation Oncology  Phone:  [phone] Fax:        [phone]  Weekly Treatment Note    Name: Marissa Brooks Date: 08/07/2015 MRN: [id number] DOB: 10-Mar-1956   Diagnosis:     ICD-9-CM ICD-10-CM   1. Breast cancer of upper-outer quadrant of right female breast s/p Right MRM 10/30/14 174.4 C50.411      Current dose: 59.8 Gy  Current fraction: 30   MEDICATIONS: Current Outpatient Prescriptions  Medication Sig Dispense Refill  . ARIPiprazole (ABILIFY) 5 MG tablet Take 1 tablet (5 mg total) by mouth daily. For mood control 30 tablet 0  . gabapentin (NEURONTIN) 100 MG capsule Take 1 capsule (100 mg total) by mouth 3 (three) times daily. 90 capsule 2  . hyaluronate sodium (RADIAPLEXRX) GEL Apply 1 application topically 2 (two) times daily.    Marland Kitchen lisinopril (PRINIVIL,ZESTRIL) 10 MG tablet Take 10 mg by mouth daily.    . non-metallic deodorant Jethro Poling) MISC Apply 1 application topically daily as needed. Reported on 07/10/2015    . sertraline (ZOLOFT) 100 MG tablet Take 1 tablet (100 mg total) by mouth at bedtime. For depression 30 tablet 0  . traZODone (DESYREL) 100 MG tablet Take 1 tablet (100 mg total) by mouth at bedtime. For sleep 30 tablet 0  . ondansetron (ZOFRAN) 8 MG tablet TAKE ONE TABLET   (8 MG TOTAL) BY MOUTH   TWICE A DAY   START THE DAY AFTER CHEMO FOR THREE   DAYS. THEN TAKE AS NEEDED   FOR NAUSEA OR VOMI (Patient not taking: Reported on 08/07/2015) 20 tablet 3  . pantoprazole (PROTONIX) 40 MG tablet Take 1 tablet (40 mg total) by mouth daily. (Patient not taking: Reported on 08/07/2015) 60 tablet 0   No current facility-administered medications for this encounter.     ALLERGIES: Centrum   LABORATORY DATA:  Lab Results  Component Value Date   WBC 3.4* 07/16/2015   HGB 12.2 07/16/2015   HCT 36.1 07/16/2015   MCV 83.8 07/16/2015   PLT 115* 07/16/2015   Lab Results  Component Value Date   NA 141 07/16/2015   K 4.1 07/16/2015   CL 116* 03/22/2015     CO2 26 07/16/2015   Lab Results  Component Value Date   ALT 56* 07/16/2015   AST 57* 07/16/2015   ALKPHOS 96 07/16/2015   BILITOT 0.33 07/16/2015     NARRATIVE: Marissa Brooks was seen today for weekly treatment management. The chart was checked and the patient's films were reviewed.  Weekly rad tx 30/30 right chest wall/breast area, hyperpigmentation, dry skin, subclavian area erythema, right arm wrapped  In ace bandage  At lymphedema clinic this am, no pain, using radiaplex bid, appetite good,  11:34 AM BP 136/79 mmHg  Pulse 72  Temp(Src) 97.8 F (36.6 C) (Oral)  Resp 16  Wt 165 lb 9.6 oz (75.116 kg)  Wt Readings from Last 3 Encounters:  08/07/15 165 lb 9.6 oz (75.116 kg)  07/24/15 170 lb 11.2 oz (77.429 kg)  07/17/15 172 lb (78.019 kg)    PHYSICAL EXAMINATION: weight is 165 lb 9.6 oz (75.116 kg). Her oral temperature is 97.8 F (36.6 C). Her blood pressure is 136/79 and her pulse is 72. Her respiration is 16.      The patient's skin shows diffuse hyperpigmentation in the treatment area. A couple of very small areas of desquamation. Overall her skin has held up well during treatment.  ASSESSMENT: The patient is doing satisfactorily  with treatment.  PLAN: We will continue with the patient's radiation treatment as planned. The patient will see me in one month. We discussed skin care issues and the patient will begin using vitamin E oil in the next couple of weeks.

## 2015-08-07 NOTE — Therapy (Signed)
[place], [place], [address] Phone: [phone]   Fax:  [phone]  Physical Therapy Treatment  Patient Details  Name: MARHTA LOURY MRN: [id number] Date of Birth: [birth year] Referring Provider: Shona Simpson, PA-C, Dr. Iran Planas  Encounter Date: 08/07/2015      PT End of Session - 08/07/15 1234    Visit Number 4   Number of Visits 24   Date for PT Re-Evaluation 09/17/15   PT Start Time 0936   PT Stop Time 1015   PT Time Calculation (min) 39 min   Activity Tolerance Patient tolerated treatment well   Behavior During Therapy [facility] for tasks assessed/performed      Past Medical History  Diagnosis Date  . Hypertension   . Shortness of breath dyspnea     with walking  . Pneumonia   . Anxiety   . Depression   . Bipolar disorder ([place])   . GERD (gastroesophageal reflux disease)   . Arthritis   . Cancer [facility]) 2002    left breast cancer tx in [place], right breast cancer 08/2014  . ETOH abuse     as of 10/21/14 none for a week  . Cocaine abuse     last use 10/13/14  . Breast cancer ([place]) 09/10/14    right breast,hx left breast ca,2002 [place]  . Allergy   . Tobacco abuse   . HCV (hepatitis C virus)     Past Surgical History  Procedure Laterality Date  . Mastectomy      and Implant  . Mandible fracture surgery    . Breast surgery  2002    left mastectomy with implant  . Tonsillectomy    . Mastectomy w/ sentinel node biopsy Right 10/30/2014  . Simple mastectomy with axillary sentinel node biopsy Right 10/30/2014    Procedure: RIGHT MASTECTOMY WITH SENTINEL NODE MAPPING;  Surgeon: Autumn Messing III, MD;  Location: [facility];  Service: General;  Laterality: Right;  . Esophagogastroduodenoscopy (egd) with propofol N/A 03/17/2015    Procedure: ESOPHAGOGASTRODUODENOSCOPY (EGD) WITH PROPOFOL;  Surgeon: Gatha Mayer, MD;  [facility];  Service: Endoscopy;  Laterality: N/A;     There were no vitals filed for this visit.  Visit Diagnosis:  Post-lymphadenectomy lymphedema of arm  Right arm pain      Subjective Assessment - 08/07/15 0937    Subjective "I'm ready to get these off."  (re: bandages)   Currently in Pain? Yes   Pain Score 5    Pain Location Arm   Pain Orientation Right   Aggravating Factors  twisting into internal rotation   Pain Relieving Factors not moving into internal rotation               LYMPHEDEMA/ONCOLOGY QUESTIONNAIRE - 08/07/15 0941    Right Upper Extremity Lymphedema   10 cm Proximal to Olecranon Process 32.8 cm   10 cm Proximal to Ulnar Styloid Process 23.2 cm   Just Proximal to Ulnar Styloid Process 17.7 cm   Across Hand at [employer] 19.3 cm   At [place] of 2nd Digit 6.1 cm                  [facility] Adult PT Treatment/Exercise - 08/07/15 0001    Manual Therapy   Edema Management removed bandages; skin checked (fine) and circumference measurements taken   Manual Lymphatic Drainage (MLD) In supine, short neck, diaphragmatic breathing, superficial abdominal, left axilla and left anterior interaxillary anastomosis, right  groin and right axillo-inguinal anastomosis, right UE from shoulder to hand directing towards established anatomosis   Compression Bandaging right UE bandaged with thick stockinette, Elastomull to all five fingers, Artiflex x 1, and 1-6 cm., 1-10 cm., and 1-12 cm. bandage from hand to shoulder.   Prosthetics   Prosthetic Care Comments                      Short Term Clinic Goals - 08/05/15 1502    CC Short Term Goal  #1   Title Patient will verbalize understanding of lymphedema risk reduction practices.   Status On-going   CC Short Term Goal  #2   Title Patient will reduce right UE at 10 cm proximal to olecranon process to </= 32 cm.   Status On-going   CC Short Term Goal  #3   Title Patient will reduce right UE at 10 cm proximal to ulnar styloid process to </= 22 cm.    Status On-going   CC Short Term Goal  #4   Title Patient will improve her lymphedema life impact scale score to </= 24 for improved function   Status On-going   CC Short Term Goal  #5   Title Patient will report >/= 25% improvement in right UE pain and tightness.   Status On-going             Long Term Clinic Goals - 07/23/15 1257    CC Long Term Goal  #1   Title Patient will verbalize understanding of where and how to be fitted for a compression sleeve and glove.   Time 8   Period Weeks   Status New   CC Long Term Goal  #2   Title Patient will reduce right UE at 10 cm proximal to olecranon process to </= 30 cm.   Baseline 33.7 cm   Time 8   Period Weeks   Status New   CC Long Term Goal  #3   Title Patient will reduce right UE at 10 cm proximal to ulnar styloid process process to </= 21 cm.   Baseline 24.2 cm   Time 8   Period Weeks   Status New   CC Long Term Goal  #4   Title Patient will improve her lymphedema life impact scale score to </= 20 for improved function   Baseline 29   Time 8   Period Weeks   Status New   CC Long Term Goal  #5   Title Patient will report >/= 50% improvement in right UE pain and tightness.   Time 8   Period Weeks   Status New            Plan - 08/07/15 1234    Clinical Impression Statement Nice reductions already in just one session as measured today.  Still with fibrosis in right forearm; may use foam there, but didn't want to start that yet today.  Patient did well keeping bandages on since last session.  She asks appropriate questions about how this treatment works.   Pt will benefit from skilled therapeutic intervention in order to improve on the following deficits Increased edema;Pain;Decreased knowledge of precautions;Decreased range of motion;Impaired UE functional use   Rehab Potential Good   Clinical Impairments Affecting Rehab Potential Drug use and compliance concerns (pt canceled first 2 evals), transportation   PT  Frequency 3x / week   PT Duration 8 weeks   PT Treatment/Interventions Manual lymph drainage;Compression bandaging;Patient/family education;ADLs/Self Care  Home Management   PT Next Visit Plan continue complete decongestive therapy; consider using [employer] small dot foam or other foam at forearm fibrosis   PT Home Exercise Plan remedial exercises for lymphedema   Consulted and Agree with Plan of Care Patient        Problem List Patient Active Problem List   Diagnosis Date Noted  . Lymphedema of upper extremity 08/04/2015  . Shoulder pain, right 08/04/2015  . Radiation dermatitis 08/04/2015  . Peripheral neuropathy due to chemotherapy ([place]) 06/15/2015  . AKI (acute kidney injury) ([place])   . Esophagitis   . Leukocytosis 03/17/2015  . Acute esophagitis 03/17/2015  . HCAP (healthcare-associated pneumonia) 03/17/2015  . Hematemesis with nausea   . Acute blood loss anemia 03/16/2015  . Dysphagia 03/16/2015  . Hypocalcemia 03/16/2015  . GIB (gastrointestinal bleeding) 03/15/2015  . Tobacco abuse 03/15/2015  . ARF (acute renal failure) ([place]) 03/15/2015  . Hypokalemia 03/15/2015  . Depression 03/15/2015  . Hypertension   . GERD (gastroesophageal reflux disease)   . Anxiety   . Acute kidney injury ([place])   . Essential hypertension   . Gastroesophageal reflux disease without esophagitis   . Breast cancer of upper-outer quadrant of right female breast s/p Right MRM 10/30/14 10/30/2014  . Alcohol dependence ([place]) 07/09/2013  . MDD (major depressive disorder) ([place]) 07/09/2013  . Alcohol abuse with alcohol-induced mood disorder ([place]) 07/08/2013  . 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 OBESITY 05/13/2010  . TONGUE DISORDER 01/20/2010  . BACK PAIN, LUMBAR 09/03/2008  . THROMBOCYTOPENIA 06/08/2007  . HCV (hepatitis C virus) 06/07/2007  . BIPOLAR AFFECTIVE DISORDER 06/07/2007  . Substance abuse 06/07/2007  . PTSD 06/07/2007  . CONSTIPATION 06/07/2007  . HEPATIC CIRRHOSIS 06/07/2007  .  UNSPECIFIED CONCUSSION 06/07/2007  . NEOPLASM, MALIGNANT, BREAST, HX OF 06/07/2007    S[place]ISBUR[place],DONNA 08/07/2015, 12:37 PM  [place], [place], [address] Phone: [phone]   Fax:  [phone]  Name: COSMA POLLINO MRN: [id number] Date of Birth: January 04, 1956    Serafina Royals, PT 08/07/2015 12:37 PM

## 2015-08-07 NOTE — Progress Notes (Signed)
  Radiation Oncology         [phone] ________________________________  Name: Marissa Brooks MRN: [id number]  Date: 07/29/2015  DOB: 1955/09/29  Complex simulation note  The patient has undergone complex simulation for her upcoming boost treatment for her diagnosis of breast cancer. The patient has initially been planned to receive 49.8 Gy to the chest wall. The patient will now receive a 10 Gy boost to the mastectomy scar which has been identified. This will be accomplished using an en face electron field. Based on the depth of the target area, 6 MeV electrons will be used. The patient's final total dose therefore will be 59 .8 Gy. A special port plan is requested for the boost treatment.   _______________________________  Jodelle Gross, MD, PhD

## 2015-08-07 NOTE — Progress Notes (Signed)
Weekly rad tx 30/30 right chest wall/breast area, hyperpigmentation, dry skin, subclavian area erythema, right arm wrapped  In ace bandage  At lymphedema clinic this am, no pain, using radiaplex bid, appetite good,  11:26 AM BP 136/79 mmHg  Pulse 72  Temp(Src) 97.8 F (36.6 C) (Oral)  Resp 16  Wt 165 lb 9.6 oz (75.116 kg)  Wt Readings from Last 3 Encounters:  08/07/15 165 lb 9.6 oz (75.116 kg)  07/24/15 170 lb 11.2 oz (77.429 kg)  07/17/15 172 lb (78.019 kg)

## 2015-08-10 ENCOUNTER — Ambulatory Visit: Payer: Medicare HMO

## 2015-08-10 ENCOUNTER — Ambulatory Visit: Payer: Medicare HMO | Admitting: Physical Therapy

## 2015-08-10 DIAGNOSIS — M25611 Stiffness of right shoulder, not elsewhere classified: Secondary | ICD-10-CM

## 2015-08-10 DIAGNOSIS — I890 Lymphedema, not elsewhere classified: Principal | ICD-10-CM

## 2015-08-10 DIAGNOSIS — E8989 Other postprocedural endocrine and metabolic complications and disorders: Secondary | ICD-10-CM

## 2015-08-10 DIAGNOSIS — R293 Abnormal posture: Secondary | ICD-10-CM

## 2015-08-10 DIAGNOSIS — M79601 Pain in right arm: Secondary | ICD-10-CM

## 2015-08-10 NOTE — Therapy (Signed)
[place], [place], [address] Phone: [phone]   Fax:  [phone]  Physical Therapy Treatment  Patient Details  Name: Marissa Brooks MRN: [id number] Date of Birth: 06-13-1955 Referring Provider: Shona Simpson, PA-C, Dr. Iran Planas  Encounter Date: 08/10/2015      PT End of Session - 08/10/15 2229    Visit Number 5   Number of Visits 24   Date for PT Re-Evaluation 09/17/15   PT Start Time 0937   PT Stop Time 1015   PT Time Calculation (min) 38 min   Activity Tolerance Patient tolerated treatment well   Behavior During Therapy [facility] for tasks assessed/performed      Past Medical History  Diagnosis Date  . Hypertension   . Shortness of breath dyspnea     with walking  . Pneumonia   . Anxiety   . Depression   . Bipolar disorder ([place])   . GERD (gastroesophageal reflux disease)   . Arthritis   . Cancer [facility]) 2002    left breast cancer tx in [place], right breast cancer 08/2014  . ETOH abuse     as of 10/21/14 none for a week  . Cocaine abuse     last use 10/13/14  . Breast cancer ([place]) 09/10/14    right breast,hx left breast ca,2002 [place]  . Allergy   . Tobacco abuse   . HCV (hepatitis C virus)     Past Surgical History  Procedure Laterality Date  . Mastectomy      and Implant  . Mandible fracture surgery    . Breast surgery  2002    left mastectomy with implant  . Tonsillectomy    . Mastectomy w/ sentinel node biopsy Right 10/30/2014  . Simple mastectomy with axillary sentinel node biopsy Right 10/30/2014    Procedure: RIGHT MASTECTOMY WITH SENTINEL NODE MAPPING;  Surgeon: Autumn Messing III, MD;  Location: [place];  Service: General;  Laterality: Right;  . Esophagogastroduodenoscopy (egd) with propofol N/A 03/17/2015    Procedure: ESOPHAGOGASTRODUODENOSCOPY (EGD) WITH PROPOFOL;  Surgeon: Gatha Mayer, MD;  [facility];  Service: Endoscopy;  Laterality: N/A;     There were no vitals filed for this visit.  Visit Diagnosis:  Post-lymphadenectomy lymphedema of arm  Right arm pain  Abnormal posture  Shoulder stiffness, right      Subjective Assessment - 08/10/15 0938    Subjective Ready to get the bandages off for a minute.   Currently in Pain? No/denies                         [facility] Adult PT Treatment/Exercise - 08/10/15 0001    Manual Therapy   Manual Lymphatic Drainage (MLD) In supine, short neck, diaphragmatic breathing, superficial abdominal, left axilla and left anterior interaxillary anastomosis, right groin and right axillo-inguinal anastomosis, right UE from shoulder to hand directing towards established anatomosis   Compression Bandaging right UE bandaged with thick stockinette, Elastomull to all five fingers, peach Medi small dot foam at forearm, Artiflex x 1, and 1-6 cm., 1-10 cm., and 1-12 cm. bandage from hand to shoulder.                   Short Term Clinic Goals - 08/05/15 1502    CC Short Term Goal  #1   Title Patient will verbalize understanding of lymphedema risk reduction practices.   Status On-going   CC Short Term Goal  #  2   Title Patient will reduce right UE at 10 cm proximal to olecranon process to </= 32 cm.   Status On-going   CC Short Term Goal  #3   Title Patient will reduce right UE at 10 cm proximal to ulnar styloid process to </= 22 cm.   Status On-going   CC Short Term Goal  #4   Title Patient will improve her lymphedema life impact scale score to </= 24 for improved function   Status On-going   CC Short Term Goal  #5   Title Patient will report >/= 25% improvement in right UE pain and tightness.   Status On-going             Long Term Clinic Goals - 07/23/15 1257    CC Long Term Goal  #1   Title Patient will verbalize understanding of where and how to be fitted for a compression sleeve and glove.   Time 8   Period Weeks   Status New   CC Long Term Goal  #2    Title Patient will reduce right UE at 10 cm proximal to olecranon process to </= 30 cm.   Baseline 33.7 cm   Time 8   Period Weeks   Status New   CC Long Term Goal  #3   Title Patient will reduce right UE at 10 cm proximal to ulnar styloid process process to </= 21 cm.   Baseline 24.2 cm   Time 8   Period Weeks   Status New   CC Long Term Goal  #4   Title Patient will improve her lymphedema life impact scale score to </= 20 for improved function   Baseline 29   Time 8   Period Weeks   Status New   CC Long Term Goal  #5   Title Patient will report >/= 50% improvement in right UE pain and tightness.   Time 8   Period Weeks   Status New            Plan - 08/10/15 2229    Clinical Impression Statement Still with fibrosis in right forearm in particular; peach Medi small dot foam used today to help soften this.  Pt. again did well keeping bandages on since last visit.   Pt will benefit from skilled therapeutic intervention in order to improve on the following deficits Increased edema;Pain;Decreased knowledge of precautions;Decreased range of motion;Impaired UE functional use   Rehab Potential Good   Clinical Impairments Affecting Rehab Potential Drug use and compliance concerns (pt canceled first 2 evals), transportation   PT Frequency 3x / week   PT Duration 8 weeks   PT Treatment/Interventions Manual lymph drainage;Compression bandaging;Patient/family education;ADLs/Self Care Home Management   PT Next Visit Plan Remeasure; continue complete decongestive therapy; check benefit of small dot foam inside bandages   Consulted and Agree with Plan of Care Patient        Problem List Patient Active Problem List   Diagnosis Date Noted  . Lymphedema of upper extremity 08/04/2015  . Shoulder pain, right 08/04/2015  . Radiation dermatitis 08/04/2015  . Peripheral neuropathy due to chemotherapy ([place]) 06/15/2015  . AKI (acute kidney injury) ([place])   . Esophagitis   . Leukocytosis  03/17/2015  . Acute esophagitis 03/17/2015  . HCAP (healthcare-associated pneumonia) 03/17/2015  . Hematemesis with nausea   . Acute blood loss anemia 03/16/2015  . Dysphagia 03/16/2015  . Hypocalcemia 03/16/2015  . GIB (gastrointestinal bleeding) 03/15/2015  . Tobacco  abuse 03/15/2015  . ARF (acute renal failure) ([place]) 03/15/2015  . Hypokalemia 03/15/2015  . Depression 03/15/2015  . Hypertension   . GERD (gastroesophageal reflux disease)   . Anxiety   . Acute kidney injury ([place])   . Essential hypertension   . Gastroesophageal reflux disease without esophagitis   . Breast cancer of upper-outer quadrant of right female breast s/p Right MRM 10/30/14 10/30/2014  . Alcohol dependence ([place]) 07/09/2013  . MDD (major depressive disorder) ([place]) 07/09/2013  . Alcohol abuse with alcohol-induced mood disorder ([place]) 07/08/2013  . 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 OBESITY 05/13/2010  . TONGUE DISORDER 01/20/2010  . BACK PAIN, LUMBAR 09/03/2008  . THROMBOCYTOPENIA 06/08/2007  . HCV (hepatitis C virus) 06/07/2007  . BIPOLAR AFFECTIVE DISORDER 06/07/2007  . Substance abuse 06/07/2007  . PTSD 06/07/2007  . CONSTIPATION 06/07/2007  . HEPATIC CIRRHOSIS 06/07/2007  . UNSPECIFIED CONCUSSION 06/07/2007  . NEOPLASM, MALIGNANT, BREAST, HX OF 06/07/2007    SALISBURY,DONNA 08/10/2015, 10:38 PM  [place], [place], [address] Phone: [phone]   Fax:  [phone]  Name: LOURENA LICHT MRN: [id number] Date of Birth: Feb 13, 1956    Serafina Royals, PT 08/10/2015 10:38 PM

## 2015-08-11 ENCOUNTER — Ambulatory Visit: Payer: Medicare HMO

## 2015-08-12 ENCOUNTER — Ambulatory Visit: Payer: Medicare HMO

## 2015-08-12 DIAGNOSIS — R293 Abnormal posture: Secondary | ICD-10-CM

## 2015-08-12 DIAGNOSIS — M25611 Stiffness of right shoulder, not elsewhere classified: Secondary | ICD-10-CM

## 2015-08-12 DIAGNOSIS — M79601 Pain in right arm: Secondary | ICD-10-CM

## 2015-08-12 DIAGNOSIS — I890 Lymphedema, not elsewhere classified: Principal | ICD-10-CM

## 2015-08-12 DIAGNOSIS — E8989 Other postprocedural endocrine and metabolic complications and disorders: Secondary | ICD-10-CM

## 2015-08-12 NOTE — Therapy (Signed)
[place], [place], [address] Phone: [phone]   Fax:  [phone]  Physical Therapy Treatment  Patient Details  Name: Marissa Brooks MRN: [id number] Date of Birth: [birth year] Referring Provider: Shona Simpson, PA-C, Dr. Iran Planas  Encounter Date: 08/12/2015      PT End of Session - 08/12/15 1020    Visit Number 6   Number of Visits 24   Date for PT Re-Evaluation 09/17/15   PT Start Time 0941  Pt arrived late   PT Stop Time 1017   PT Time Calculation (min) 36 min   Activity Tolerance Patient tolerated treatment well   Behavior During Therapy [facility] for tasks assessed/performed      Past Medical History  Diagnosis Date  . Hypertension   . Shortness of breath dyspnea     with walking  . Pneumonia   . Anxiety   . Depression   . Bipolar disorder ([place])   . GERD (gastroesophageal reflux disease)   . Arthritis   . Cancer [facility]) 2002    left breast cancer tx in [place], right breast cancer 08/2014  . ETOH abuse     as of 10/21/14 none for a week  . Cocaine abuse     last use 10/13/14  . Breast cancer ([place]) 09/10/14    right breast,hx left breast ca,2002 [place]  . Allergy   . Tobacco abuse   . HCV (hepatitis C virus)     Past Surgical History  Procedure Laterality Date  . Mastectomy      and Implant  . Mandible fracture surgery    . Breast surgery  2002    left mastectomy with implant  . Tonsillectomy    . Mastectomy w/ sentinel node biopsy Right 10/30/2014  . Simple mastectomy with axillary sentinel node biopsy Right 10/30/2014    Procedure: RIGHT MASTECTOMY WITH SENTINEL NODE MAPPING;  Surgeon: Autumn Messing III, MD;  Location: [place];  Service: General;  Laterality: Right;  . Esophagogastroduodenoscopy (egd) with propofol N/A 03/17/2015    Procedure: ESOPHAGOGASTRODUODENOSCOPY (EGD) WITH PROPOFOL;  Surgeon: Gatha Mayer, MD;  [facility];  Service: Endoscopy;   Laterality: N/A;    There were no vitals filed for this visit.  Visit Diagnosis:  Post-lymphadenectomy lymphedema of arm  Right arm pain  Abnormal posture  Shoulder stiffness, right      Subjective Assessment - 08/12/15 0947    Subjective The foam felt fine, no complaints.   Pertinent History Bilateral mastectomies (left 2005 and right 10/30/14) with right SLNB.  Adjuvant chemotherapy 2016 and currently doing radiation for right chest.  Bipolar disorder managed with medication. Patient admits to alcohol and crack use.   Patient Stated Goals reduce right arm swelling.   Currently in Pain? No/denies                         [facility] Adult PT Treatment/Exercise - 08/12/15 0001    Manual Therapy   Manual Lymphatic Drainage (MLD) In supine, short neck, diaphragmatic breathing, superficial abdominal, left axilla and left anterior interaxillary anastomosis, right groin and right axillo-inguinal anastomosis, right UE from shoulder to hand directing towards established anatomosis and focused on forearm at area of fibrosis.   Compression Bandaging right UE bandaged with thick stockinette, Elastomull to all five fingers, peach Medi small dot foam at forearm, Artiflex x 1 and 1/4" gray foam at anterior elbow, and 1-6 cm., 1-10 cm., and 1-12  cm. bandage from hand to shoulder.                   Short Term Clinic Goals - 08/05/15 1502    CC Short Term Goal  #1   Title Patient will verbalize understanding of lymphedema risk reduction practices.   Status On-going   CC Short Term Goal  #2   Title Patient will reduce right UE at 10 cm proximal to olecranon process to </= 32 cm.   Status On-going   CC Short Term Goal  #3   Title Patient will reduce right UE at 10 cm proximal to ulnar styloid process to </= 22 cm.   Status On-going   CC Short Term Goal  #4   Title Patient will improve her lymphedema life impact scale score to </= 24 for improved function   Status On-going    CC Short Term Goal  #5   Title Patient will report >/= 25% improvement in right UE pain and tightness.   Status On-going             Long Term Clinic Goals - 07/23/15 1257    CC Long Term Goal  #1   Title Patient will verbalize understanding of where and how to be fitted for a compression sleeve and glove.   Time 8   Period Weeks   Status New   CC Long Term Goal  #2   Title Patient will reduce right UE at 10 cm proximal to olecranon process to </= 30 cm.   Baseline 33.7 cm   Time 8   Period Weeks   Status New   CC Long Term Goal  #3   Title Patient will reduce right UE at 10 cm proximal to ulnar styloid process process to </= 21 cm.   Baseline 24.2 cm   Time 8   Period Weeks   Status New   CC Long Term Goal  #4   Title Patient will improve her lymphedema life impact scale score to </= 20 for improved function   Baseline 29   Time 8   Period Weeks   Status New   CC Long Term Goal  #5   Title Patient will report >/= 50% improvement in right UE pain and tightness.   Time 8   Period Weeks   Status New            Plan - 08/12/15 1020    Clinical Impression Statement Pt still presents with fibrosis in her forearm, did not have time to measure today though due to her running late. Softening of tissue noted with use of fibrotic techniques today and pt reported feeling a difference as well at end of session. Wrapped as before with use of foam which pt is tolerating well. Pt continues to do well with compliance of wear of her bandages keeping them until she arrives.   Pt will benefit from skilled therapeutic intervention in order to improve on the following deficits Increased edema;Pain;Decreased knowledge of precautions;Decreased range of motion;Impaired UE functional use   Rehab Potential Good   Clinical Impairments Affecting Rehab Potential Drug use and compliance concerns (pt canceled first 2 evals), transportation   PT Frequency 3x / week   PT Duration 8 weeks   PT  Treatment/Interventions Manual lymph drainage;Compression bandaging;Patient/family education;ADLs/Self Care Home Management   PT Next Visit Plan Remeasure; continue complete decongestive therapy   PT Home Exercise Plan remedial exercises for lymphedema   Consulted and Agree  with Plan of Care Patient        Problem List Patient Active Problem List   Diagnosis Date Noted  . Lymphedema of upper extremity 08/04/2015  . Shoulder pain, right 08/04/2015  . Radiation dermatitis 08/04/2015  . Peripheral neuropathy due to chemotherapy ([place]) 06/15/2015  . AKI (acute kidney injury) ([place])   . Esophagitis   . Leukocytosis 03/17/2015  . Acute esophagitis 03/17/2015  . HCAP (healthcare-associated pneumonia) 03/17/2015  . Hematemesis with nausea   . Acute blood loss anemia 03/16/2015  . Dysphagia 03/16/2015  . Hypocalcemia 03/16/2015  . GIB (gastrointestinal bleeding) 03/15/2015  . Tobacco abuse 03/15/2015  . ARF (acute renal failure) ([place]) 03/15/2015  . Hypokalemia 03/15/2015  . Depression 03/15/2015  . Hypertension   . GERD (gastroesophageal reflux disease)   . Anxiety   . Acute kidney injury ([place])   . Essential hypertension   . Gastroesophageal reflux disease without esophagitis   . Breast cancer of upper-outer quadrant of right female breast s/p Right MRM 10/30/14 10/30/2014  . Alcohol dependence ([place]) 07/09/2013  . MDD (major depressive disorder) ([place]) 07/09/2013  . Alcohol abuse with alcohol-induced mood disorder ([place]) 07/08/2013  . 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 OBESITY 05/13/2010  . TONGUE DISORDER 01/20/2010  . BACK PAIN, LUMBAR 09/03/2008  . THROMBOCYTOPENIA 06/08/2007  . HCV (hepatitis C virus) 06/07/2007  . BIPOLAR AFFECTIVE DISORDER 06/07/2007  . Substance abuse 06/07/2007  . PTSD 06/07/2007  . CONSTIPATION 06/07/2007  . HEPATIC CIRRHOSIS 06/07/2007  . UNSPECIFIED CONCUSSION 06/07/2007  . NEOPLASM, MALIGNANT, BREAST, HX OF 06/07/2007    Otelia Limes,  PTA 08/12/2015, 10:33 AM  [place], [place], [address] Phone: [phone]   Fax:  [phone]  Name: SHANECE HAMBRIC MRN: [id number] Date of Birth: [birth year]

## 2015-08-14 ENCOUNTER — Ambulatory Visit: Payer: Medicare HMO | Admitting: Physical Therapy

## 2015-08-14 DIAGNOSIS — M79601 Pain in right arm: Secondary | ICD-10-CM

## 2015-08-14 DIAGNOSIS — E8989 Other postprocedural endocrine and metabolic complications and disorders: Secondary | ICD-10-CM | POA: Diagnosis not present

## 2015-08-14 DIAGNOSIS — I890 Lymphedema, not elsewhere classified: Principal | ICD-10-CM

## 2015-08-14 NOTE — Therapy (Signed)
[place], [place], [address] Phone: [phone]   Fax:  [phone]  Physical Therapy Treatment  Patient Details  Name: Marissa Brooks MRN: [id number] Date of Birth: [birth year] Referring Provider: Shona Simpson, PA-C, Dr. Iran Planas  Encounter Date: 08/14/2015      PT End of Session - 08/14/15 1223    Visit Number 7   Number of Visits 24   Date for PT Re-Evaluation 09/17/15   PT Start Time 0942   PT Stop Time 1023   PT Time Calculation (min) 41 min   Activity Tolerance Patient tolerated treatment well   Behavior During Therapy [facility] for tasks assessed/performed      Past Medical History  Diagnosis Date  . Hypertension   . Shortness of breath dyspnea     with walking  . Pneumonia   . Anxiety   . Depression   . Bipolar disorder ([place])   . GERD (gastroesophageal reflux disease)   . Arthritis   . Cancer [facility]) 2002    left breast cancer tx in [place], right breast cancer 08/2014  . ETOH abuse     as of 10/21/14 none for a week  . Cocaine abuse     last use 10/13/14  . Breast cancer ([place]) 09/10/14    right breast,hx left breast ca,2002 [place]  . Allergy   . Tobacco abuse   . HCV (hepatitis C virus)     Past Surgical History  Procedure Laterality Date  . Mastectomy      and Implant  . Mandible fracture surgery    . Breast surgery  2002    left mastectomy with implant  . Tonsillectomy    . Mastectomy w/ sentinel node biopsy Right 10/30/2014  . Simple mastectomy with axillary sentinel node biopsy Right 10/30/2014    Procedure: RIGHT MASTECTOMY WITH SENTINEL NODE MAPPING;  Surgeon: Autumn Messing III, MD;  Location: [place];  Service: General;  Laterality: Right;  . Esophagogastroduodenoscopy (egd) with propofol N/A 03/17/2015    Procedure: ESOPHAGOGASTRODUODENOSCOPY (EGD) WITH PROPOFOL;  Surgeon: Gatha Mayer, MD;  [facility];  Service: Endoscopy;  Laterality: N/A;     There were no vitals filed for this visit.  Visit Diagnosis:  Post-lymphadenectomy lymphedema of arm  Right arm pain      Subjective Assessment - 08/14/15 0947    Subjective Dry mouth from medications; did okay with bandages; nothing else new.   Currently in Pain? No/denies            [facility] PT Assessment - 08/14/15 0001    Palpation   Palpation comment slightly less indurated at right forearm           LYMPHEDEMA/ONCOLOGY QUESTIONNAIRE - 08/14/15 0948    Right Upper Extremity Lymphedema   10 cm Proximal to Olecranon Process 32.1 cm   Olecranon Process 27.7 cm   15 cm Proximal to Ulnar Styloid Process 27.2 cm   10 cm Proximal to Ulnar Styloid Process 24.2 cm   Just Proximal to Ulnar Styloid Process 18.3 cm   Across Hand at [employer] 19.2 cm   At [place] of 2nd Digit 6.2 cm                  [facility] Adult PT Treatment/Exercise - 08/14/15 0001    Manual Therapy   Edema Management removed bandages; skin checked (fine) and circumference measurements taken   Manual Lymphatic Drainage (MLD) In supine, short neck, superficial  and deep abdominal, left axilla and left anterior interaxillary anastomosis, right groin and right axillo-inguinal anastomosis, right UE from shoulder to hand directing towards established anatomosis and focused on forearm at area of fibrosis.   Compression Bandaging right UE bandaged with thick stockinette, Elastomull to all five fingers, peach Medi small dot foam at forearm, and 1/4" foam around forearm, Artiflex x 1 and 1/4" gray foam at anterior elbow, and 1-6 cm., 1-10 cm., and 1-12 cm. bandage from hand to shoulder.                   Short Term Clinic Goals - 08/14/15 1225    CC Short Term Goal  #1   Title Patient will verbalize understanding of lymphedema risk reduction practices.   Status On-going   CC Short Term Goal  #2   Title Patient will reduce right UE at 10 cm proximal to olecranon process to </= 32 cm.    Baseline at 32.1 on 08/14/15   Status Partially Met   CC Short Term Goal  #3   Title Patient will reduce right UE at 10 cm proximal to ulnar styloid process to </= 22 cm.   Status On-going   CC Short Term Goal  #4   Title Patient will improve her lymphedema life impact scale score to </= 24 for improved function   Status On-going   CC Short Term Goal  #5   Title Patient will report >/= 25% improvement in right UE pain and tightness.   Status On-going             Long Term Clinic Goals - 07/23/15 1257    CC Long Term Goal  #1   Title Patient will verbalize understanding of where and how to be fitted for a compression sleeve and glove.   Time 8   Period Weeks   Status New   CC Long Term Goal  #2   Title Patient will reduce right UE at 10 cm proximal to olecranon process to </= 30 cm.   Baseline 33.7 cm   Time 8   Period Weeks   Status New   CC Long Term Goal  #3   Title Patient will reduce right UE at 10 cm proximal to ulnar styloid process process to </= 21 cm.   Baseline 24.2 cm   Time 8   Period Weeks   Status New   CC Long Term Goal  #4   Title Patient will improve her lymphedema life impact scale score to </= 20 for improved function   Baseline 29   Time 8   Period Weeks   Status New   CC Long Term Goal  #5   Title Patient will report >/= 50% improvement in right UE pain and tightness.   Time 8   Period Weeks   Status New            Plan - 08/14/15 1224    Clinical Impression Statement Hand measurements about the same, forearm a little increased, and upper arm reduced as per measurements today.  Fibrosis of forearm is a little bit softer.  She has been very compliant about keeping bandages on and coming to appointments.   Pt will benefit from skilled therapeutic intervention in order to improve on the following deficits Increased edema;Pain;Decreased knowledge of precautions;Decreased range of motion;Impaired UE functional use   Rehab Potential Good    Clinical Impairments Affecting Rehab Potential Drug use and compliance concerns (pt canceled first  2 evals), transportation   PT Frequency 3x / week   PT Duration 8 weeks   PT Treatment/Interventions Manual lymph drainage;Compression bandaging;Patient/family education;ADLs/Self Care Home Management   PT Next Visit Plan continue complete decongestive therapy   PT Home Exercise Plan remedial exercises for lymphedema   Consulted and Agree with Plan of Care Patient        Problem List Patient Active Problem List   Diagnosis Date Noted  . Lymphedema of upper extremity 08/04/2015  . Shoulder pain, right 08/04/2015  . Radiation dermatitis 08/04/2015  . Peripheral neuropathy due to chemotherapy ([place]) 06/15/2015  . AKI (acute kidney injury) ([place])   . Esophagitis   . Leukocytosis 03/17/2015  . Acute esophagitis 03/17/2015  . HCAP (healthcare-associated pneumonia) 03/17/2015  . Hematemesis with nausea   . Acute blood loss anemia 03/16/2015  . Dysphagia 03/16/2015  . Hypocalcemia 03/16/2015  . GIB (gastrointestinal bleeding) 03/15/2015  . Tobacco abuse 03/15/2015  . ARF (acute renal failure) ([place]) 03/15/2015  . Hypokalemia 03/15/2015  . Depression 03/15/2015  . Hypertension   . GERD (gastroesophageal reflux disease)   . Anxiety   . Acute kidney injury ([place])   . Essential hypertension   . Gastroesophageal reflux disease without esophagitis   . Breast cancer of upper-outer quadrant of right female breast s/p Right MRM 10/30/14 10/30/2014  . Alcohol dependence ([place]) 07/09/2013  . MDD (major depressive disorder) ([place]) 07/09/2013  . Alcohol abuse with alcohol-induced mood disorder ([place]) 07/08/2013  . 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 OBESITY 05/13/2010  . TONGUE DISORDER 01/20/2010  . BACK PAIN, LUMBAR 09/03/2008  . THROMBOCYTOPENIA 06/08/2007  . HCV (hepatitis C virus) 06/07/2007  . BIPOLAR AFFECTIVE DISORDER 06/07/2007  . Substance abuse 06/07/2007  . PTSD 06/07/2007  .  CONSTIPATION 06/07/2007  . HEPATIC CIRRHOSIS 06/07/2007  . UNSPECIFIED CONCUSSION 06/07/2007  . NEOPLASM, MALIGNANT, BREAST, HX OF 06/07/2007    Brin Ruggerio 08/14/2015, 12:27 PM  [place], [place], [address] Phone: [phone]   Fax:  [phone]  Name: CATLIN DORIA MRN: [id number] Date of Birth: [birth year]    Serafina Royals, PT 08/14/2015 12:27 PM

## 2015-08-17 ENCOUNTER — Ambulatory Visit: Payer: Medicare HMO

## 2015-08-17 ENCOUNTER — Emergency Department (HOSPITAL_COMMUNITY)
Admission: EM | Admit: 2015-08-17 | Discharge: 2015-08-17 | Disposition: A | Discharge status: Home / Self Care | Payer: Medicare HMO | Attending: Emergency Medicine | Admitting: Emergency Medicine

## 2015-08-17 ENCOUNTER — Encounter (HOSPITAL_COMMUNITY): Payer: Self-pay | Admitting: Emergency Medicine

## 2015-08-17 DIAGNOSIS — I10 Essential (primary) hypertension: Secondary | ICD-10-CM | POA: Diagnosis not present

## 2015-08-17 DIAGNOSIS — Z853 Personal history of malignant neoplasm of breast: Secondary | ICD-10-CM | POA: Insufficient documentation

## 2015-08-17 DIAGNOSIS — N611 Abscess of the breast and nipple: Secondary | ICD-10-CM | POA: Diagnosis not present

## 2015-08-17 DIAGNOSIS — F419 Anxiety disorder, unspecified: Secondary | ICD-10-CM | POA: Insufficient documentation

## 2015-08-17 DIAGNOSIS — F319 Bipolar disorder, unspecified: Secondary | ICD-10-CM | POA: Diagnosis not present

## 2015-08-17 DIAGNOSIS — F17210 Nicotine dependence, cigarettes, uncomplicated: Secondary | ICD-10-CM | POA: Diagnosis not present

## 2015-08-17 DIAGNOSIS — Z8701 Personal history of pneumonia (recurrent): Secondary | ICD-10-CM | POA: Insufficient documentation

## 2015-08-17 DIAGNOSIS — M1990 Unspecified osteoarthritis, unspecified site: Secondary | ICD-10-CM | POA: Diagnosis not present

## 2015-08-17 DIAGNOSIS — M79601 Pain in right arm: Secondary | ICD-10-CM

## 2015-08-17 DIAGNOSIS — Z79899 Other long term (current) drug therapy: Secondary | ICD-10-CM | POA: Diagnosis not present

## 2015-08-17 DIAGNOSIS — E8989 Other postprocedural endocrine and metabolic complications and disorders: Secondary | ICD-10-CM

## 2015-08-17 DIAGNOSIS — K219 Gastro-esophageal reflux disease without esophagitis: Secondary | ICD-10-CM | POA: Insufficient documentation

## 2015-08-17 DIAGNOSIS — N61 Mastitis without abscess: Secondary | ICD-10-CM

## 2015-08-17 DIAGNOSIS — Z8619 Personal history of other infectious and parasitic diseases: Secondary | ICD-10-CM | POA: Insufficient documentation

## 2015-08-17 DIAGNOSIS — L089 Local infection of the skin and subcutaneous tissue, unspecified: Secondary | ICD-10-CM | POA: Diagnosis present

## 2015-08-17 DIAGNOSIS — I890 Lymphedema, not elsewhere classified: Principal | ICD-10-CM

## 2015-08-17 MED ORDER — CEPHALEXIN 500 MG PO CAPS: 500.0000 mg | ORAL_CAPSULE | Freq: Four times a day (QID) | ORAL | Status: DC

## 2015-08-17 MED ORDER — CEPHALEXIN 250 MG PO CAPS: 250.0000 mg | ORAL_CAPSULE | Freq: Once | ORAL | Status: DC

## 2015-08-17 MED ORDER — CEPHALEXIN 500 MG PO CAPS
500.0000 mg | ORAL_CAPSULE | Freq: Once | ORAL | Status: AC
  Administered 2015-08-17: 500 mg via ORAL
  Filled 2015-08-17: qty 1

## 2015-08-17 NOTE — Therapy (Signed)
[place], [place], [address] Phone: [phone]   Fax:  [phone]  Physical Therapy Treatment  Patient Details  Name: Marissa Brooks MRN: [id number] Date of Birth: December 24, 1955 Referring Provider: Shona Simpson, PA-C, Dr. Iran Planas  Encounter Date: 08/17/2015      PT End of Session - 08/17/15 1022    Visit Number 8   Number of Visits 24   Date for PT Re-Evaluation 09/17/15   PT Start Time 0937   PT Stop Time 1020   PT Time Calculation (min) 43 min   Activity Tolerance Patient tolerated treatment well   Behavior During Therapy [facility] for tasks assessed/performed      Past Medical History  Diagnosis Date  . Hypertension   . Shortness of breath dyspnea     with walking  . Pneumonia   . Anxiety   . Depression   . Bipolar disorder ([place])   . GERD (gastroesophageal reflux disease)   . Arthritis   . Cancer [facility]) 2002    left breast cancer tx in [place], right breast cancer 08/2014  . ETOH abuse     as of 10/21/14 none for a week  . Cocaine abuse     last use 10/13/14  . Breast cancer ([place]) 09/10/14    right breast,hx left breast ca,2002 [place]  . Allergy   . Tobacco abuse   . HCV (hepatitis C virus)     Past Surgical History  Procedure Laterality Date  . Mastectomy      and Implant  . Mandible fracture surgery    . Breast surgery  2002    left mastectomy with implant  . Tonsillectomy    . Mastectomy w/ sentinel node biopsy Right 10/30/2014  . Simple mastectomy with axillary sentinel node biopsy Right 10/30/2014    Procedure: RIGHT MASTECTOMY WITH SENTINEL NODE MAPPING;  Surgeon: Autumn Messing III, MD;  Location: [place];  Service: General;  Laterality: Right;  . Esophagogastroduodenoscopy (egd) with propofol N/A 03/17/2015    Procedure: ESOPHAGOGASTRODUODENOSCOPY (EGD) WITH PROPOFOL;  Surgeon: Gatha Mayer, MD;  [facility];  Service: Endoscopy;  Laterality: N/A;     There were no vitals filed for this visit.  Visit Diagnosis:  Post-lymphadenectomy lymphedema of arm  Right arm pain      Subjective Assessment - 08/17/15 0945    Subjective Had to take the outer bandage off last night due to it feeling like too much pressure on front of my elbow, but other than that no problems.    Pertinent History Bilateral mastectomies (left 2005 and right 10/30/14) with right SLNB.  Adjuvant chemotherapy 2016 and currently doing radiation for right chest.  Bipolar disorder managed with medication. Patient admits to alcohol and crack use.   Patient Stated Goals reduce right arm swelling.   Currently in Pain? No/denies                         [facility] Adult PT Treatment/Exercise - 08/17/15 0001    Manual Therapy   Edema Management removed bandages; skin checked (redness at anterior elbow) and circumference measurements taken   Manual Lymphatic Drainage (MLD) In supine, short neck, superficial and deep abdominal, left axilla and left anterior interaxillary anastomosis, right groin and right axillo-inguinal anastomosis, right UE from shoulder to hand directing towards established anatomosis and focused on forearm at area of fibrosis.   Compression Bandaging right UE bandaged with thick stockinette, Elastomull  to all five fingers, peach Medi small dot foam at forearm, and 1/4" foam around forearm, Artiflex x 1 and 1/2" gray foam at anterior elbow, and 1-6 cm., 1-10 cm., and 1-12 cm. bandage from hand to shoulder.                   Short Term Clinic Goals - 08/14/15 1225    CC Short Term Goal  #1   Title Patient will verbalize understanding of lymphedema risk reduction practices.   Status On-going   CC Short Term Goal  #2   Title Patient will reduce right UE at 10 cm proximal to olecranon process to </= 32 cm.   Baseline at 32.1 on 08/14/15   Status Partially Met   CC Short Term Goal  #3   Title Patient will reduce right UE at 10 cm proximal  to ulnar styloid process to </= 22 cm.   Status On-going   CC Short Term Goal  #4   Title Patient will improve her lymphedema life impact scale score to </= 24 for improved function   Status On-going   CC Short Term Goal  #5   Title Patient will report >/= 25% improvement in right UE pain and tightness.   Status On-going             Long Term Clinic Goals - 07/23/15 1257    CC Long Term Goal  #1   Title Patient will verbalize understanding of where and how to be fitted for a compression sleeve and glove.   Time 8   Period Weeks   Status New   CC Long Term Goal  #2   Title Patient will reduce right UE at 10 cm proximal to olecranon process to </= 30 cm.   Baseline 33.7 cm   Time 8   Period Weeks   Status New   CC Long Term Goal  #3   Title Patient will reduce right UE at 10 cm proximal to ulnar styloid process process to </= 21 cm.   Baseline 24.2 cm   Time 8   Period Weeks   Status New   CC Long Term Goal  #4   Title Patient will improve her lymphedema life impact scale score to </= 20 for improved function   Baseline 29   Time 8   Period Weeks   Status New   CC Long Term Goal  #5   Title Patient will report >/= 50% improvement in right UE pain and tightness.   Time 8   Period Weeks   Status New            Plan - 08/17/15 1023    Clinical Impression Statement Pt had removed outer bandage last night due to feeling increase pressure at anterior elbow and upon doffing bandages today she did have increased redness here so used 1/2" gray foam at anterior elbow instead of 1/4" and pt reported this feeling better after bandaging. Discussed possibly measuring pt soon for compresion garments and she wants to wait a little longer to see if we can get forearm reduced a little more.    Pt will benefit from skilled therapeutic intervention in order to improve on the following deficits Increased edema;Pain;Decreased knowledge of precautions;Decreased range of motion;Impaired  UE functional use   Rehab Potential Good   Clinical Impairments Affecting Rehab Potential Drug use and compliance concerns (pt canceled first 2 evals), transportation   PT Frequency 3x / week  PT Duration 8 weeks   PT Treatment/Interventions Manual lymph drainage;Compression bandaging;Patient/family education;ADLs/Self Care Home Management   PT Next Visit Plan Remeasure circumference and assess anterior elbow skin and 1/2" gray foam here. continue complete decongestive therapy   Consulted and Agree with Plan of Care Patient        Problem List Patient Active Problem List   Diagnosis Date Noted  . Lymphedema of upper extremity 08/04/2015  . Shoulder pain, right 08/04/2015  . Radiation dermatitis 08/04/2015  . Peripheral neuropathy due to chemotherapy ([place]) 06/15/2015  . AKI (acute kidney injury) ([place])   . Esophagitis   . Leukocytosis 03/17/2015  . Acute esophagitis 03/17/2015  . HCAP (healthcare-associated pneumonia) 03/17/2015  . Hematemesis with nausea   . Acute blood loss anemia 03/16/2015  . Dysphagia 03/16/2015  . Hypocalcemia 03/16/2015  . GIB (gastrointestinal bleeding) 03/15/2015  . Tobacco abuse 03/15/2015  . ARF (acute renal failure) ([place]) 03/15/2015  . Hypokalemia 03/15/2015  . Depression 03/15/2015  . Hypertension   . GERD (gastroesophageal reflux disease)   . Anxiety   . Acute kidney injury ([place])   . Essential hypertension   . Gastroesophageal reflux disease without esophagitis   . Breast cancer of upper-outer quadrant of right female breast s/p Right MRM 10/30/14 10/30/2014  . Alcohol dependence ([place]) 07/09/2013  . MDD (major depressive disorder) ([place]) 07/09/2013  . Alcohol abuse with alcohol-induced mood disorder ([place]) 07/08/2013  . 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 OBESITY 05/13/2010  . TONGUE DISORDER 01/20/2010  . BACK PAIN, LUMBAR 09/03/2008  . THROMBOCYTOPENIA 06/08/2007  . HCV (hepatitis C virus) 06/07/2007  . BIPOLAR AFFECTIVE DISORDER 06/07/2007   . Substance abuse 06/07/2007  . PTSD 06/07/2007  . CONSTIPATION 06/07/2007  . HEPATIC CIRRHOSIS 06/07/2007  . UNSPECIFIED CONCUSSION 06/07/2007  . NEOPLASM, MALIGNANT, BREAST, HX OF 06/07/2007    Otelia Limes, PTA 08/17/2015, 10:25 AM  [place], [place], [address] Phone: [phone]   Fax:  [phone]  Name: Marissa Brooks MRN: [id number] Date of Birth: 03-20-1956

## 2015-08-17 NOTE — Discharge Instructions (Signed)
Please contact Dr. Lisbeth Renshaw and inform him of today's visit and addition of medication. Please schedule follow-up evaluation with him. Please return to the emergency room if any new or worsening signs or symptoms present.

## 2015-08-17 NOTE — ED Provider Notes (Signed)
CSN: [id number]     Arrival date & time 08/17/15  1636 History   First MD Initiated Contact with Patient 08/17/15 1650     Chief Complaint  Patient presents with  . Wound Infection    HPI   60 year old female presents today with complaints of wound infection. Patient is a poor historian and has difficulty recalling specific details about her care. Patient has a history of bilateral mastectomies ( left thousand 5 and right 10/30/2014) with right lymphadenectomy with resulting lymphedema of the right arm.   Patient is currently receiving radiation therapy 30/30  of 59.8 Gy fraction 30, most recent treatment 08/07/2015.   Pt reports redness around her mastectomy incision site starting  5 days ago, she reports this is localized to the surrounding tissue. Patient denies any nausea, vomiting, fever, chills, significant tenderness to palpation. Patient reports that she has been using an ointment on the site, she is unable to tell me what ointment it is, why she has it noting that she got up from" the hospital".   She also notes edema to the right upper extremity, this is normal per patient, currently receiving physical therapy with manual lymphatic massage.    Past Medical History  Diagnosis Date  . Hypertension   . Shortness of breath dyspnea     with walking  . Pneumonia   . Anxiety   . Depression   . Bipolar disorder ([place])   . GERD (gastroesophageal reflux disease)   . Arthritis   . Cancer [facility]) 2002    left breast cancer tx in [place], right breast cancer 08/2014  . ETOH abuse     as of 10/21/14 none for a week  . Cocaine abuse     last use 10/13/14  . Breast cancer ([place]) 09/10/14    right breast,hx left breast ca,2002 [place]  . Allergy   . Tobacco abuse   . HCV (hepatitis C virus)    Past Surgical History  Procedure Laterality Date  . Mastectomy      and Implant  . Mandible fracture surgery    . Breast surgery  2002    left mastectomy with implant  .  Tonsillectomy    . Mastectomy w/ sentinel node biopsy Right 10/30/2014  . Simple mastectomy with axillary sentinel node biopsy Right 10/30/2014    Procedure: RIGHT MASTECTOMY WITH SENTINEL NODE MAPPING;  Surgeon: Autumn Messing III, MD;  Location: [place];  Service: General;  Laterality: Right;  . Esophagogastroduodenoscopy (egd) with propofol N/A 03/17/2015    Procedure: ESOPHAGOGASTRODUODENOSCOPY (EGD) WITH PROPOFOL;  Surgeon: Gatha Mayer, MD;  [facility];  Service: Endoscopy;  Laterality: N/A;   Family History  Problem Relation Age of Onset  . Breast cancer Sister 85  . Mental illness Mother   . HIV/AIDS Sister     Deceased   Social History  Substance Use Topics  . Smoking status: Current Every Day Smoker -- 0.25 packs/day    Types: Cigarettes  . Smokeless tobacco: Never Used  . Alcohol Use: Yes     Comment: was a heavy drinker (daily) none for a week (as of 10/21/14.   OB History    No data available     Review of Systems  All other systems reviewed and are negative.   Allergies  Centrum  Home Medications   Prior to Admission medications   Medication Sig Start Date End Date Taking? Authorizing Provider  ARIPiprazole (ABILIFY) 5 MG tablet Take 1 tablet (5 mg  total) by mouth daily. For mood control 07/12/13  Yes Encarnacion Slates, NP  gabapentin (NEURONTIN) 100 MG capsule Take 1 capsule (100 mg total) by mouth 3 (three) times daily. 06/15/15  Yes Truitt Merle, MD  hyaluronate sodium (RADIAPLEXRX) GEL Apply 1 application topically 2 (two) times daily. 06/29/15  Yes Kyung Rudd, MD  lisinopril (PRINIVIL,ZESTRIL) 10 MG tablet Take 10 mg by mouth daily.   Yes Historical Provider, MD  non-metallic deodorant Jethro Poling) MISC Apply 1 application topically daily as needed. Reported on 07/10/2015 06/30/15  Yes Kyung Rudd, MD  sertraline (ZOLOFT) 100 MG tablet Take 1 tablet (100 mg total) by mouth at bedtime. For depression 07/12/13  Yes Encarnacion Slates, NP  traZODone (DESYREL) 100 MG tablet Take 1  tablet (100 mg total) by mouth at bedtime. For sleep 07/12/13  Yes Encarnacion Slates, NP  cephALEXin (KEFLEX) 500 MG capsule Take 1 capsule (500 mg total) by mouth 4 (four) times daily. 08/17/15   Abbey Veith, PA-C  ondansetron (ZOFRAN) 8 MG tablet TAKE ONE TABLET   (8 MG TOTAL) BY MOUTH   TWICE A DAY   START THE DAY AFTER CHEMO FOR THREE   DAYS. THEN TAKE AS NEEDED   FOR NAUSEA OR VOMI Patient not taking: Reported on 08/07/2015 04/03/15   Truitt Merle, MD  pantoprazole (PROTONIX) 40 MG tablet Take 1 tablet (40 mg total) by mouth daily. Patient not taking: Reported on 08/07/2015 03/22/15   Donne Hazel, MD   BP 116/89 mmHg  Pulse 74  Temp(Src) 98.2 F (36.8 C) (Oral)  Resp 16  SpO2 98%   Physical Exam  Constitutional: She is oriented to person, place, and time. She appears well-developed and well-nourished.  HENT:  Head: Normocephalic and atraumatic.  Eyes: Conjunctivae are normal. Pupils are equal, round, and reactive to light. Right eye exhibits no discharge. Left eye exhibits no discharge. No scleral icterus.  Neck: Normal range of motion. No JVD present. No tracheal deviation present.  Pulmonary/Chest: Effort normal. No stridor.  Neurological: She is alert and oriented to person, place, and time. Coordination normal.  Skin:  Post mastectomy incision scar with minor amount of surrounding redness, with ointment on it. No extension up into the shoulder or remainder of breast. Minor amount of desquamation and hyperpigmentation  Psychiatric: She has a normal mood and affect. Her behavior is normal. Judgment and thought content normal.  Nursing note and vitals reviewed.   ED Course  Procedures (including critical care time) Labs Review Labs Reviewed - No data to display  Imaging Review No results found. I have personally reviewed and evaluated these images and lab results as part of my medical decision-making.   EKG Interpretation None      MDM   Final diagnoses:  Cellulitis of  breast    Labs:  Imaging:  Consults:  Therapeutics: Keflex  Discharge Meds: Keflex  Assessment/Plan:  This 60 year old female presents today with likely early signs of cellulitis. There appears to be no major extension, this is likely chronic breakdown from radiation therapy. Patient is afebrile, nontoxic in no acute distress. She was given dose of antibiotics here, discharged home on oral antibiotics, encouraged to contact her oncologist and inform them of today's visit and all relevant data and schedule follow-up evaluation. Patient is given strict return precautions, she verbalized understanding and agreement today's plan had no further questions or concerns at time of discharge        Okey Regal, PA-C 08/17/15 2001  Lacretia Leigh,  MD 08/20/15 [id number]

## 2015-08-17 NOTE — ED Notes (Signed)
Per EMS, Pt from home, Pt just had final breast cancer radiation treatment on 3/17. Pt has a skin irritation/ worsening wound on R chest. Pt has been given ointment and pads to put on it by MD [facility] but sts those haven't helped. Pt c/o 7/10 pain at the site. Pt also has R arm wrapped from fluid build up. No further complaints about R arm at this time. A&Ox4, ambulatory.

## 2015-08-17 NOTE — ED Notes (Signed)
Bed: WA03 Expected date:  Expected time:  Means of arrival:  Comments: EMS- [place] Pt/chest wound unknown origin/possibly from radiation

## 2015-08-19 ENCOUNTER — Ambulatory Visit: Payer: Medicare HMO | Admitting: Physical Therapy

## 2015-08-19 ENCOUNTER — Other Ambulatory Visit: Payer: Self-pay | Admitting: Adult Health

## 2015-08-19 ENCOUNTER — Telehealth: Payer: Self-pay | Admitting: Nurse Practitioner

## 2015-08-19 ENCOUNTER — Encounter: Payer: Self-pay | Admitting: Physical Therapy

## 2015-08-19 ENCOUNTER — Telehealth: Payer: Self-pay | Admitting: Oncology

## 2015-08-19 DIAGNOSIS — I890 Lymphedema, not elsewhere classified: Principal | ICD-10-CM

## 2015-08-19 DIAGNOSIS — E8989 Other postprocedural endocrine and metabolic complications and disorders: Secondary | ICD-10-CM

## 2015-08-19 DIAGNOSIS — M79601 Pain in right arm: Secondary | ICD-10-CM

## 2015-08-19 DIAGNOSIS — C50411 Malignant neoplasm of upper-outer quadrant of right female breast: Secondary | ICD-10-CM

## 2015-08-19 NOTE — Telephone Encounter (Signed)
Marissa Brooks called and said she needs an appointment with Dr. Lisbeth Renshaw on Monday at 8 am.  She said her phone number has changed and will call back later today.

## 2015-08-19 NOTE — Therapy (Signed)
[place], [place], [address] Phone: [phone]   Fax:  [phone]  Physical Therapy Treatment  Patient Details  Name: Marissa Brooks MRN: [id number] Date of Birth: [birth year] Referring Provider: Shona Simpson, PA-C, Dr. Iran Planas  Encounter Date: 08/19/2015      PT End of Session - 08/19/15 1019    Visit Number 9   Number of Visits 24   Date for PT Re-Evaluation 09/17/15   PT Start Time 0932   PT Stop Time 1016   PT Time Calculation (min) 44 min   Activity Tolerance Patient tolerated treatment well   Behavior During Therapy [facility] for tasks assessed/performed      Past Medical History  Diagnosis Date  . Hypertension   . Shortness of breath dyspnea     with walking  . Pneumonia   . Anxiety   . Depression   . Bipolar disorder ([place])   . GERD (gastroesophageal reflux disease)   . Arthritis   . Cancer [facility]) 2002    left breast cancer tx in [place], right breast cancer 08/2014  . ETOH abuse     as of 10/21/14 none for a week  . Cocaine abuse     last use 10/13/14  . Breast cancer ([place]) 09/10/14    right breast,hx left breast ca,2002 [place]  . Allergy   . Tobacco abuse   . HCV (hepatitis C virus)     Past Surgical History  Procedure Laterality Date  . Mastectomy      and Implant  . Mandible fracture surgery    . Breast surgery  2002    left mastectomy with implant  . Tonsillectomy    . Mastectomy w/ sentinel node biopsy Right 10/30/2014  . Simple mastectomy with axillary sentinel node biopsy Right 10/30/2014    Procedure: RIGHT MASTECTOMY WITH SENTINEL NODE MAPPING;  Surgeon: Autumn Messing III, MD;  Location: [place];  Service: General;  Laterality: Right;  . Esophagogastroduodenoscopy (egd) with propofol N/A 03/17/2015    Procedure: ESOPHAGOGASTRODUODENOSCOPY (EGD) WITH PROPOFOL;  Surgeon: Gatha Mayer, MD;  [facility];  Service: Endoscopy;  Laterality: N/A;     There were no vitals filed for this visit.  Visit Diagnosis:  Post-lymphadenectomy lymphedema of arm  Right arm pain      Subjective Assessment - 08/19/15 0940    Subjective The foam helped. There was not too much pressure and I did not have to take them off.    Pertinent History Bilateral mastectomies (left 2005 and right 10/30/14) with right SLNB.  Adjuvant chemotherapy 2016 and currently doing radiation for right chest.  Bipolar disorder managed with medication. Patient admits to alcohol and crack use.   Patient Stated Goals reduce right arm swelling.   Currently in Pain? No/denies   Pain Score 0-No pain               LYMPHEDEMA/ONCOLOGY QUESTIONNAIRE - 08/19/15 0942    Right Upper Extremity Lymphedema   10 cm Proximal to Olecranon Process 32.5 cm   Olecranon Process 28 cm   15 cm Proximal to Ulnar Styloid Process 26.5 cm   10 cm Proximal to Ulnar Styloid Process 23.2 cm   Just Proximal to Ulnar Styloid Process 18.3 cm   Across Hand at [employer] 19.3 cm   At [place] of 2nd Digit 6.2 cm                  [facility] Adult  PT Treatment/Exercise - 08/19/15 0001    Manual Therapy   Edema Management removed bandages; skin checked (minimal redness at anterior elbow) and circumference measurements taken   Manual Lymphatic Drainage (MLD) In supine, short neck, superficial and deep abdominal, left axilla and left anterior interaxillary anastomosis, right groin and right axillo-inguinal anastomosis, right UE from shoulder to hand directing towards established anatomosis and focused on forearm at area of fibrosis.   Compression Bandaging right UE bandaged with cotton, Elastomull to all five fingers, peach Medi small dot foam at forearm, and 1/4" foam around forearm, Artiflex x 1 and 1/2" gray foam at anterior elbow, and 1-6 cm., 1-10 cm., and 1-12 cm. bandage from hand to shoulder.                   Short Term Clinic Goals - 08/14/15 1225    CC Short Term Goal   #1   Title Patient will verbalize understanding of lymphedema risk reduction practices.   Status On-going   CC Short Term Goal  #2   Title Patient will reduce right UE at 10 cm proximal to olecranon process to </= 32 cm.   Baseline at 32.1 on 08/14/15   Status Partially Met   CC Short Term Goal  #3   Title Patient will reduce right UE at 10 cm proximal to ulnar styloid process to </= 22 cm.   Status On-going   CC Short Term Goal  #4   Title Patient will improve her lymphedema life impact scale score to </= 24 for improved function   Status On-going   CC Short Term Goal  #5   Title Patient will report >/= 25% improvement in right UE pain and tightness.   Status On-going             Long Term Clinic Goals - 07/23/15 1257    CC Long Term Goal  #1   Title Patient will verbalize understanding of where and how to be fitted for a compression sleeve and glove.   Time 8   Period Weeks   Status New   CC Long Term Goal  #2   Title Patient will reduce right UE at 10 cm proximal to olecranon process to </= 30 cm.   Baseline 33.7 cm   Time 8   Period Weeks   Status New   CC Long Term Goal  #3   Title Patient will reduce right UE at 10 cm proximal to ulnar styloid process process to </= 21 cm.   Baseline 24.2 cm   Time 8   Period Weeks   Status New   CC Long Term Goal  #4   Title Patient will improve her lymphedema life impact scale score to </= 20 for improved function   Baseline 29   Time 8   Period Weeks   Status New   CC Long Term Goal  #5   Title Patient will report >/= 50% improvement in right UE pain and tightness.   Time 8   Period Weeks   Status New            Plan - 08/19/15 1019    Clinical Impression Statement Pt demonstrates further reduction of right forearm edema. She had decreased redness at antecubital fossa this visit. The foam helped with this. Pt will be ready for a compression garment once she demonstrates maximal reduction.    Pt will benefit from  skilled therapeutic intervention in order to improve on the  following deficits Increased edema;Pain;Decreased knowledge of precautions;Decreased range of motion;Impaired UE functional use   Rehab Potential Good   Clinical Impairments Affecting Rehab Potential Drug use and compliance concerns (pt canceled first 2 evals), transportation   PT Frequency 3x / week   PT Duration 8 weeks   PT Treatment/Interventions Manual lymph drainage;Compression bandaging;Patient/family education;ADLs/Self Care Home Management   PT Next Visit Plan add thick stockinette under bandages, reassess measurements and progress towards goals, MLD    Consulted and Agree with Plan of Care Patient        Problem List Patient Active Problem List   Diagnosis Date Noted  . Lymphedema of upper extremity 08/04/2015  . Shoulder pain, right 08/04/2015  . Radiation dermatitis 08/04/2015  . Peripheral neuropathy due to chemotherapy ([place]) 06/15/2015  . AKI (acute kidney injury) ([place])   . Esophagitis   . Leukocytosis 03/17/2015  . Acute esophagitis 03/17/2015  . HCAP (healthcare-associated pneumonia) 03/17/2015  . Hematemesis with nausea   . Acute blood loss anemia 03/16/2015  . Dysphagia 03/16/2015  . Hypocalcemia 03/16/2015  . GIB (gastrointestinal bleeding) 03/15/2015  . Tobacco abuse 03/15/2015  . ARF (acute renal failure) ([place]) 03/15/2015  . Hypokalemia 03/15/2015  . Depression 03/15/2015  . Hypertension   . GERD (gastroesophageal reflux disease)   . Anxiety   . Acute kidney injury ([place])   . Essential hypertension   . Gastroesophageal reflux disease without esophagitis   . Breast cancer of upper-outer quadrant of right female breast s/p Right MRM 10/30/14 10/30/2014  . Alcohol dependence ([place]) 07/09/2013  . MDD (major depressive disorder) ([place]) 07/09/2013  . Alcohol abuse with alcohol-induced mood disorder ([place]) 07/08/2013  . 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 OBESITY 05/13/2010  . TONGUE DISORDER 01/20/2010   . BACK PAIN, LUMBAR 09/03/2008  . THROMBOCYTOPENIA 06/08/2007  . HCV (hepatitis C virus) 06/07/2007  . BIPOLAR AFFECTIVE DISORDER 06/07/2007  . Substance abuse 06/07/2007  . PTSD 06/07/2007  . CONSTIPATION 06/07/2007  . HEPATIC CIRRHOSIS 06/07/2007  . UNSPECIFIED CONCUSSION 06/07/2007  . NEOPLASM, MALIGNANT, BREAST, HX OF 06/07/2007    Alexia Freestone 08/19/2015, 10:22 AM  [place], [place], [address] Phone: [phone]   Fax:  [phone]  Name: SHERRIAN NUNNELLEY MRN: [id number] Date of Birth: 07-21-1955    Allyson Sabal, PT 08/19/2015 10:23 AM

## 2015-08-19 NOTE — Telephone Encounter (Signed)
cld pt @ [facility] @[id number] -persi=on stated no one there by that name-pt to get updated copy of sch on 3/30 for appt added for surviorship program 5/23@2 

## 2015-08-20 ENCOUNTER — Encounter: Payer: Self-pay | Admitting: Hematology

## 2015-08-20 ENCOUNTER — Telehealth: Payer: Self-pay | Admitting: *Deleted

## 2015-08-20 ENCOUNTER — Telehealth: Payer: Self-pay | Admitting: Hematology

## 2015-08-20 ENCOUNTER — Other Ambulatory Visit: Payer: Self-pay

## 2015-08-20 NOTE — Telephone Encounter (Signed)
cld pt no answer to r/s appt

## 2015-08-20 NOTE — Telephone Encounter (Signed)
Pt [place] for office visit today.  Attempted to call pt  X 2 unsuccessfully.   Unable to leave message due to phone ringing and got hung up.

## 2015-08-20 NOTE — Progress Notes (Signed)
No show  This encounter was created in error - please disregard.

## 2015-08-21 ENCOUNTER — Ambulatory Visit: Payer: Self-pay | Admitting: Radiation Oncology

## 2015-08-21 ENCOUNTER — Ambulatory Visit: Payer: Medicare HMO | Admitting: Physical Therapy

## 2015-08-21 DIAGNOSIS — E8989 Other postprocedural endocrine and metabolic complications and disorders: Secondary | ICD-10-CM | POA: Diagnosis not present

## 2015-08-21 DIAGNOSIS — I972 Postmastectomy lymphedema syndrome: Secondary | ICD-10-CM

## 2015-08-21 NOTE — Therapy (Signed)
[place], [place], [address] Phone: [phone]   Fax:  [phone]  Physical Therapy Treatment  Patient Details  Name: Marissa Brooks MRN: [id number] Date of Birth: Jan 20, 1956 Referring Provider: Shona Simpson, PA-C, Dr. Iran Planas  Encounter Date: 08/21/2015      PT End of Session - 08/21/15 1014    Visit Number 10   Number of Visits 24   Date for PT Re-Evaluation 09/17/15   PT Start Time 0918   PT Stop Time 1010   PT Time Calculation (min) 52 min   Activity Tolerance Patient tolerated treatment well   Behavior During Therapy [facility] for tasks assessed/performed      Past Medical History  Diagnosis Date  . Hypertension   . Shortness of breath dyspnea     with walking  . Pneumonia   . Anxiety   . Depression   . Bipolar disorder ([place])   . GERD (gastroesophageal reflux disease)   . Arthritis   . Cancer [facility]) 2002    left breast cancer tx in [place], right breast cancer 08/2014  . ETOH abuse     as of 10/21/14 none for a week  . Cocaine abuse     last use 10/13/14  . Breast cancer ([place]) 09/10/14    right breast,hx left breast ca,2002 [place]  . Allergy   . Tobacco abuse   . HCV (hepatitis C virus)     Past Surgical History  Procedure Laterality Date  . Mastectomy      and Implant  . Mandible fracture surgery    . Breast surgery  2002    left mastectomy with implant  . Tonsillectomy    . Mastectomy w/ sentinel node biopsy Right 10/30/2014  . Simple mastectomy with axillary sentinel node biopsy Right 10/30/2014    Procedure: RIGHT MASTECTOMY WITH SENTINEL NODE MAPPING;  Surgeon: Autumn Messing III, MD;  Location: [place];  Service: General;  Laterality: Right;  . Esophagogastroduodenoscopy (egd) with propofol N/A 03/17/2015    Procedure: ESOPHAGOGASTRODUODENOSCOPY (EGD) WITH PROPOFOL;  Surgeon: Gatha Mayer, MD;  [facility];  Service: Endoscopy;  Laterality: N/A;     There were no vitals filed for this visit.  Visit Diagnosis:  Postmastectomy lymphedema syndrome      Subjective Assessment - 08/21/15 0920    Subjective Nothing new today.  I reckon it's doing fine (the bandages).   Currently in Pain? No/denies                         [facility] Adult PT Treatment/Exercise - 08/21/15 0001    Manual Therapy   Edema Management removed bandages; skin checked (minimal redness at anterior elbow)    Manual Lymphatic Drainage (MLD) In supine, short neck, superficial and deep abdominal, left axilla and left anterior interaxillary anastomosis, right groin and right axillo-inguinal anastomosis, right UE from shoulder to fingers directing towards established anatomosis and focused on forearm at area of fibrosis.   Compression Bandaging right UE bandaged with thick stockinette, Elastomull to all five fingers, large dot peach Medi foam (full piece) at forearm and small dot foam at upper forearm and across posterior elbow, 1/2 inch gray foam piece at antecubital fossa, Artiflex x 1.5, and 3 short stretch bandages from hand to shoulder                   Short Term Clinic Goals - 08/21/15 1018  CC Short Term Goal  #1   Title Patient will verbalize understanding of lymphedema risk reduction practices.   Status On-going   CC Short Term Goal  #2   Title Patient will reduce right UE at 10 cm proximal to olecranon process to </= 32 cm.   Status On-going   CC Short Term Goal  #3   Title Patient will reduce right UE at 10 cm proximal to ulnar styloid process to </= 22 cm.   Status On-going   CC Short Term Goal  #5   Title Patient will report >/= 25% improvement in right UE pain and tightness.   Status On-going             Long Term Clinic Goals - 07/23/15 1257    CC Long Term Goal  #1   Title Patient will verbalize understanding of where and how to be fitted for a compression sleeve and glove.   Time 8   Period Weeks   Status New    CC Long Term Goal  #2   Title Patient will reduce right UE at 10 cm proximal to olecranon process to </= 30 cm.   Baseline 33.7 cm   Time 8   Period Weeks   Status New   CC Long Term Goal  #3   Title Patient will reduce right UE at 10 cm proximal to ulnar styloid process process to </= 21 cm.   Baseline 24.2 cm   Time 8   Period Weeks   Status New   CC Long Term Goal  #4   Title Patient will improve her lymphedema life impact scale score to </= 20 for improved function   Baseline 29   Time 8   Period Weeks   Status New   CC Long Term Goal  #5   Title Patient will report >/= 50% improvement in right UE pain and tightness.   Time 8   Period Weeks   Status New            Plan - 09-10-2015 1014    Clinical Impression Statement Patient's forearm continues to feel fibrotic, and wrist area and just proximal to that was more swollen today, so focus with bandaging was on firm pressure at wrist and large dot foam was tried at forearm today.  Patient has been impressively compliant with keeping bandages on.  She was reminded to remove bandages in case of unrelieved pain, and agreed to do that.   Pt will benefit from skilled therapeutic intervention in order to improve on the following deficits Increased edema;Pain;Decreased knowledge of precautions;Decreased range of motion;Impaired UE functional use   Rehab Potential Good   Clinical Impairments Affecting Rehab Potential Drug use and compliance concerns (pt canceled first 2 evals), transportation   PT Frequency 3x / week   PT Duration 8 weeks   PT Treatment/Interventions Manual lymph drainage;Compression bandaging;Patient/family education;ADLs/Self Care Home Management   PT Next Visit Plan assess large dot foam; assess goals; repeat lymphedema life impact scale; continue complete decongestive therapy   PT Home Exercise Plan remedial exercises for lymphedema   Recommended Other Services Patient's demographic info was faxed to [facility]  with request for class I custom sleeve and glove as well as nighttime garment.  ([employer] informed us they were unable to process her insurance.)   Consulted and Agree with Plan of Care Patient          G-Codes - 2015-09-10 1022    Functional Assessment Tool  Used clinical judgement   Functional Limitation Other PT primary   Other PT Primary Current Status [phone]) At least 40 percent but less than 60 percent impaired, limited or restricted   Other PT Primary Goal Status [id number]) At least 1 percent but less than 20 percent impaired, limited or restricted      Problem List Patient Active Problem List   Diagnosis Date Noted  . Lymphedema of upper extremity 08/04/2015  . Shoulder pain, right 08/04/2015  . Radiation dermatitis 08/04/2015  . Peripheral neuropathy due to chemotherapy ([place]) 06/15/2015  . AKI (acute kidney injury) ([place])   . Esophagitis   . Leukocytosis 03/17/2015  . Acute esophagitis 03/17/2015  . HCAP (healthcare-associated pneumonia) 03/17/2015  . Hematemesis with nausea   . Acute blood loss anemia 03/16/2015  . Dysphagia 03/16/2015  . Hypocalcemia 03/16/2015  . GIB (gastrointestinal bleeding) 03/15/2015  . Tobacco abuse 03/15/2015  . ARF (acute renal failure) ([place]) 03/15/2015  . Hypokalemia 03/15/2015  . Depression 03/15/2015  . Hypertension   . GERD (gastroesophageal reflux disease)   . Anxiety   . Acute kidney injury ([place])   . Essential hypertension   . Gastroesophageal reflux disease without esophagitis   . Breast cancer of upper-outer quadrant of right female breast s/p Right MRM 10/30/14 10/30/2014  . Alcohol dependence ([place]) 07/09/2013  . MDD (major depressive disorder) ([place]) 07/09/2013  . Alcohol abuse with alcohol-induced mood disorder ([place]) 07/08/2013  . 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 OBESITY 05/13/2010  . TONGUE DISORDER 01/20/2010  . BACK PAIN, LUMBAR 09/03/2008  . THROMBOCYTOPENIA 06/08/2007  . HCV (hepatitis C virus) 06/07/2007  . BIPOLAR  AFFECTIVE DISORDER 06/07/2007  . Substance abuse 06/07/2007  . PTSD 06/07/2007  . CONSTIPATION 06/07/2007  . HEPATIC CIRRHOSIS 06/07/2007  . UNSPECIFIED CONCUSSION 06/07/2007  . NEOPLASM, MALIGNANT, BREAST, HX OF 06/07/2007    SALISBURY,DONNA 08/21/2015, 10:24 AM  [place], [place], [address] Phone: [phone]   Fax:  [phone]  Name: Marissa Brooks MRN: [id number] Date of Birth: 1956-01-29    Serafina Royals, PT 08/21/2015 10:24 AM

## 2015-08-24 ENCOUNTER — Ambulatory Visit
Admission: RE | Admit: 2015-08-24 | Discharge: 2015-08-24 | Disposition: A | Discharge status: Home / Self Care | Payer: Medicare HMO | Source: Ambulatory Visit | Attending: Radiation Oncology | Admitting: Radiation Oncology

## 2015-08-24 ENCOUNTER — Ambulatory Visit: Payer: Medicare HMO | Attending: Radiation Oncology

## 2015-08-24 VITALS — BP 124/62 | HR 67 | Temp 99.0°F | Resp 20 | Ht 62.0 in | Wt 172.1 lb

## 2015-08-24 DIAGNOSIS — L589 Radiodermatitis, unspecified: Secondary | ICD-10-CM

## 2015-08-24 DIAGNOSIS — C50411 Malignant neoplasm of upper-outer quadrant of right female breast: Secondary | ICD-10-CM

## 2015-08-24 DIAGNOSIS — I972 Postmastectomy lymphedema syndrome: Secondary | ICD-10-CM | POA: Insufficient documentation

## 2015-08-24 MED ORDER — SULFAMETHOXAZOLE-TRIMETHOPRIM 800-160 MG PO TABS: 1.0000 | ORAL_TABLET | Freq: Two times a day (BID) | ORAL | Status: DC

## 2015-08-24 MED ORDER — HYDROCODONE-ACETAMINOPHEN 5-325 MG PO TABS: 1.0000 | ORAL_TABLET | Freq: Four times a day (QID) | ORAL | Status: DC | PRN

## 2015-08-24 NOTE — Progress Notes (Addendum)
After assessment by Shona Simpson, PA educated Marissa Brooks on the use of [place] soaks - place 1 to 2 packs in 16 oz of warm water, submerge sterile gauze pad or clean washcloth in the solution, then apply to her right chest wall for 15-30 minutes. Teachback.

## 2015-08-24 NOTE — Progress Notes (Addendum)
Follow up s/p radiation to right chest wall, was in the ED 08/17/15 wound infection  Of site, on Kelfex 500mg  otal 4x day x 10 days, last time lymphedema clinic  08/21/15, ace bandaged right arm, patient co/o burning and throbbing on right chest site, is healing, some drainage stated,dry ness, pink  And scant rednes/pink tinged skin ;   Burning and throbbing takes advil prn, appetite good,  Energy level good,   8:08 AM BP 124/62 mmHg  Pulse 67  Temp(Src) 99 F (37.2 C) (Oral)  Resp 20  Ht 5\' 2"  (1.575 m)  Wt 172 lb 1.6 oz (78.064 kg)  BMI 31.47 kg/m2  Wt Readings from Last 3 Encounters:  08/24/15 172 lb 1.6 oz (78.064 kg)  08/07/15 165 lb 9.6 oz (75.116 kg)  07/24/15 170 lb 11.2 oz (77.429 kg)

## 2015-08-24 NOTE — Therapy (Signed)
[place], [place], [address] Phone: [phone]   Fax:  [phone]  Physical Therapy Treatment  Patient Details  Name: Marissa Brooks MRN: [id number] Date of Birth: [birth year] Referring Provider: Shona Simpson, PA-C, Dr. Iran Planas  Encounter Date: 08/24/2015      PT End of Session - 08/24/15 1007    Visit Number 11   Number of Visits 24   Date for PT Re-Evaluation 09/17/15   PT Start Time 0937   PT Stop Time 0956   PT Time Calculation (min) 19 min   Activity Tolerance Treatment limited secondary to medical complications (Comment)   Behavior During Therapy [facility] for tasks assessed/performed      Past Medical History  Diagnosis Date  . Hypertension   . Shortness of breath dyspnea     with walking  . Pneumonia   . Anxiety   . Depression   . Bipolar disorder ([place])   . GERD (gastroesophageal reflux disease)   . Arthritis   . Cancer [facility]) 2002    left breast cancer tx in [place], right breast cancer 08/2014  . ETOH abuse     as of 10/21/14 none for a week  . Cocaine abuse     last use 10/13/14  . Breast cancer ([place]) 09/10/14    right breast,hx left breast ca,2002 [place]  . Allergy   . Tobacco abuse   . HCV (hepatitis C virus)     Past Surgical History  Procedure Laterality Date  . Mastectomy      and Implant  . Mandible fracture surgery    . Breast surgery  2002    left mastectomy with implant  . Tonsillectomy    . Mastectomy w/ sentinel node biopsy Right 10/30/2014  . Simple mastectomy with axillary sentinel node biopsy Right 10/30/2014    Procedure: RIGHT MASTECTOMY WITH SENTINEL NODE MAPPING;  Surgeon: Autumn Messing III, MD;  Location: [place];  Service: General;  Laterality: Right;  . Esophagogastroduodenoscopy (egd) with propofol N/A 03/17/2015    Procedure: ESOPHAGOGASTRODUODENOSCOPY (EGD) WITH PROPOFOL;  Surgeon: Gatha Mayer, MD;  [facility];  Service:  Endoscopy;  Laterality: N/A;    There were no vitals filed for this visit.  Visit Diagnosis:  Postmastectomy lymphedema syndrome      Subjective Assessment - 08/24/15 0942    Subjective Saw my doctor this morning due to was having alot of redness on my Rt chest and they gave me an antibitotic and some cream I'm supposed to put on it because they said I had an infection.   Pertinent History Bilateral mastectomies (left 2005 and right 10/30/14) with right SLNB.  Adjuvant chemotherapy 2016 and currently doing radiation for right chest.  Bipolar disorder managed with medication. Patient admits to alcohol and crack use.   Patient Stated Goals reduce right arm swelling.   Currently in Pain? Yes   Pain Score 7    Pain Location Chest   Pain Orientation Right   Pain Descriptors / Indicators Burning;Throbbing   Pain Type Acute pain   Pain Onset In the past 7 days   Pain Frequency Constant   Aggravating Factors  Pt noticed redness and drainage from one of sores from radiation.   Pain Relieving Factors Will start antibiotics today               LYMPHEDEMA/ONCOLOGY QUESTIONNAIRE - 08/24/15 0950    Right Upper Extremity Lymphedema   10 cm Proximal to Olecranon  Process 32.4 cm   Olecranon Process 28 cm   15 cm Proximal to Ulnar Styloid Process 26.7 cm   10 cm Proximal to Ulnar Styloid Process 23.2 cm   Just Proximal to Ulnar Styloid Process 17.8 cm   Across Hand at [employer] 18.5 cm   At [place] of 2nd Digit 5.9 cm                          PT Education - 08/24/15 1019    Education provided Yes   Education Details Instructed pt to try to avoid uveruse of her Rt UE over this week that she won't be wrapped and to wear TG soft as tolerated. Also elevate arm when she is sitting as opposed to keeping it in a dependent position.    Person(s) Educated Patient   Methods Explanation   Comprehension Verbalized understanding           Short Term Clinic Goals -  08/21/15 1018    CC Short Term Goal  #1   Title Patient will verbalize understanding of lymphedema risk reduction practices.   Status On-going   CC Short Term Goal  #2   Title Patient will reduce right UE at 10 cm proximal to olecranon process to </= 32 cm.   Status On-going   CC Short Term Goal  #3   Title Patient will reduce right UE at 10 cm proximal to ulnar styloid process to </= 22 cm.   Status On-going   CC Short Term Goal  #5   Title Patient will report >/= 25% improvement in right UE pain and tightness.   Status On-going             Long Term Clinic Goals - 07/23/15 1257    CC Long Term Goal  #1   Title Patient will verbalize understanding of where and how to be fitted for a compression sleeve and glove.   Time 8   Period Weeks   Status New   CC Long Term Goal  #2   Title Patient will reduce right UE at 10 cm proximal to olecranon process to </= 30 cm.   Baseline 33.7 cm   Time 8   Period Weeks   Status New   CC Long Term Goal  #3   Title Patient will reduce right UE at 10 cm proximal to ulnar styloid process process to </= 21 cm.   Baseline 24.2 cm   Time 8   Period Weeks   Status New   CC Long Term Goal  #4   Title Patient will improve her lymphedema life impact scale score to </= 20 for improved function   Baseline 29   Time 8   Period Weeks   Status New   CC Long Term Goal  #5   Title Patient will report >/= 50% improvement in right UE pain and tightness.   Time 8   Period Weeks   Status New            Plan - 08/24/15 1007    Clinical Impression Statement Pts fibrosis felt noticeably softer today at her forearm upon her removing her bandages which she had done in the waiting room. Pt did inform me unfortunately though that she was diagnosed with cellulitis at her Rt chest this morning and they prescribed her with an antibitotic and cream so after discussing with Serafina Royals, PT, informed pt that we have to  put her on hold for 7 days from  starting her antibitiotic due to having an active infection and she verbalized understanding. Did take circumference measurements and her hand and wrist were both 0.5-0.8 cm reduced, unchanged at her forearm but her bandage from over the weekend had loosened and setteld above her elbow.    Pt will benefit from skilled therapeutic intervention in order to improve on the following deficits Increased edema;Pain;Decreased knowledge of precautions;Decreased range of motion;Impaired UE functional use   Rehab Potential Good   Clinical Impairments Affecting Rehab Potential Drug use and compliance concerns (pt canceled first 2 evals), transportation; diagnosed with cellulitis at Rt chest and was put on antibitotic 08/24/15 on hold 7 days   PT Frequency 3x / week   PT Duration 8 weeks   PT Treatment/Interventions Manual lymph drainage;Compression bandaging;Patient/family education;ADLs/Self Care Home Management   PT Next Visit Plan Pt on hold 7 days due to cellulitis at Rt anterior chest and having drainage here. Upon return assess large dot foam; assess goals; repeat lymphedema life impact scale; continue complete decongestive therapy   Consulted and Agree with Plan of Care Patient        Problem List Patient Active Problem List   Diagnosis Date Noted  . Lymphedema of upper extremity 08/04/2015  . Shoulder pain, right 08/04/2015  . Radiation dermatitis 08/04/2015  . Peripheral neuropathy due to chemotherapy ([place]) 06/15/2015  . AKI (acute kidney injury) ([place])   . Esophagitis   . Leukocytosis 03/17/2015  . Acute esophagitis 03/17/2015  . HCAP (healthcare-associated pneumonia) 03/17/2015  . Hematemesis with nausea   . Acute blood loss anemia 03/16/2015  . Dysphagia 03/16/2015  . Hypocalcemia 03/16/2015  . GIB (gastrointestinal bleeding) 03/15/2015  . Tobacco abuse 03/15/2015  . ARF (acute renal failure) ([place]) 03/15/2015  . Hypokalemia 03/15/2015  . Depression 03/15/2015  . Hypertension   . GERD  (gastroesophageal reflux disease)   . Anxiety   . Acute kidney injury ([place])   . Essential hypertension   . Gastroesophageal reflux disease without esophagitis   . Breast cancer of upper-outer quadrant of right female breast s/p Right MRM 10/30/14 10/30/2014  . Alcohol dependence ([place]) 07/09/2013  . MDD (major depressive disorder) ([place]) 07/09/2013  . Alcohol abuse with alcohol-induced mood disorder ([place]) 07/08/2013  . 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 OBESITY 05/13/2010  . TONGUE DISORDER 01/20/2010  . BACK PAIN, LUMBAR 09/03/2008  . THROMBOCYTOPENIA 06/08/2007  . HCV (hepatitis C virus) 06/07/2007  . BIPOLAR AFFECTIVE DISORDER 06/07/2007  . Substance abuse 06/07/2007  . PTSD 06/07/2007  . CONSTIPATION 06/07/2007  . HEPATIC CIRRHOSIS 06/07/2007  . UNSPECIFIED CONCUSSION 06/07/2007  . NEOPLASM, MALIGNANT, BREAST, HX OF 06/07/2007    Otelia Limes, PTA  08/24/2015, 10:20 AM  [place], [place], [address] Phone: [phone]   Fax:  [phone]  Name: Marissa Brooks MRN: [id number] Date of Birth: 11/04/1955

## 2015-08-24 NOTE — Progress Notes (Signed)
Radiation Oncology         [phone] ________________________________  Name: Marissa Brooks MRN: [id number]  Date: 08/24/2015  DOB: 07-14-1955  Follow-Up Visit Note  CC: Marissa Merino, MD  Truitt Merle, MD  Diagnosis:   Radiation dermatitis  Breast cancer of upper-outer quadrant of right female breast s/p Right MRM 10/30/14   Interval Since Last Radiation:  2 1/2 weeks  06/29/15-08/07/15: Right chest wall: 49.8 Gy with 10 Gy boost to the mastectomy scar.  Narrative:   Marissa Brooks is a very pleasant 60 year old breast cancer patient who received radiation to the right chest wall and mastectomy scar. She completed all her treatment on 08/07/2015, she was seen however in the emergency department on 08/17/2015 where she was diagnosed with an early cellulitis and was sent home with  Keflex 500 mg 1 tablet by mouth 4 times a day for 10 days. She comes today for follow-up regarding her skin.      On review of systems, the patient reports that she is doing okay but states that she has not found significant relief of her pain in her right chest. She states that she continues to notice drainage from the incision line, but states that this has slowed down since being on Keflex. She denies any significant change in the pain despite the Keflex or in the redness that she has noticed. She denies any sternal chest pain, or shortness of breath. She denies any known fevers that today her temperature is 60F. A complete review of systems is obtained and is otherwise negative.  ALLERGIES:  is allergic to centrum.  Meds: Current Outpatient Prescriptions  Medication Sig Dispense Refill  . ARIPiprazole (ABILIFY) 5 MG tablet Take 1 tablet (5 mg total) by mouth daily. For mood control 30 tablet 0  . gabapentin (NEURONTIN) 100 MG capsule Take 1 capsule (100 mg total) by mouth 3 (three) times daily. 90 capsule 2  . hyaluronate sodium (RADIAPLEXRX) GEL Apply 1 application topically 2 (two) times daily.    Marland Kitchen  HYDROcodone-acetaminophen (NORCO) 5-325 MG tablet Take 1-2 tablets by mouth every 6 (six) hours as needed for moderate pain. 30 tablet 0  . lisinopril (PRINIVIL,ZESTRIL) 10 MG tablet Take 10 mg by mouth daily.    . non-metallic deodorant Jethro Poling) MISC Apply 1 application topically daily as needed. Reported on 07/10/2015    . ondansetron (ZOFRAN) 8 MG tablet TAKE ONE TABLET   (8 MG TOTAL) BY MOUTH   TWICE A DAY   START THE DAY AFTER CHEMO FOR THREE   DAYS. THEN TAKE AS NEEDED   FOR NAUSEA OR VOMI (Patient not taking: Reported on 08/07/2015) 20 tablet 3  . pantoprazole (PROTONIX) 40 MG tablet Take 1 tablet (40 mg total) by mouth daily. (Patient not taking: Reported on 08/07/2015) 60 tablet 0  . sertraline (ZOLOFT) 100 MG tablet Take 1 tablet (100 mg total) by mouth at bedtime. For depression 30 tablet 0  . sulfamethoxazole-trimethoprim (BACTRIM DS) 800-160 MG tablet Take 1 tablet by mouth 2 (two) times daily. 20 tablet 0  . traZODone (DESYREL) 100 MG tablet Take 1 tablet (100 mg total) by mouth at bedtime. For sleep 30 tablet 0   No current facility-administered medications for this encounter.    Physical Findings:  height is 5\' 2"  (1.575 m) and weight is 172 lb 1.6 oz (78.064 kg). Her oral temperature is 99 F (37.2 C). Her blood pressure is 124/62 and her pulse is 67. Her respiration is 20.   Pain  scale 4/10 In general this is a well appearing African-American in no acute distress. She's alert and oriented x4 and appropriate throughout the examination. Cardiopulmonary assessment is negative for acute distress and she exhibits normal effort. The chest wall along the right side is evaluated and moist desquamation is noted in 2 locations along the lateral aspect of her mastectomy scar. The largest of these is approximately 1 cm in length by about 0.5 cm in width. There is fibroid noted along the wound bed. Skin surrounding the mastectomy scar and radiation dermatitis, does have erythema, warmth to the touch.  The patient does have some tenderness and some induration more medially. No new punctate sites are identified from what was described in the emergency room visit.   Lab Findings: Lab Results  Component Value Date   WBC 3.4* 07/16/2015   HGB 12.2 07/16/2015   HCT 36.1 07/16/2015   MCV 83.8 07/16/2015   PLT 115* 07/16/2015     Radiographic Findings: No results found.  Impression/Plan: 1. Radiation dermatitis with underlying cellulitis. The patient has wet desquamation along the mastectomy scar line more laterally. We discussed the use of [place] soaks and have provided this to her with supplies to perform this 2-3 times per day for about 10 minutes at a time until the desquamation reverts to dry skin. Given her symptoms of erythema, pain, and temperature of 99, we will plan to switch her antibiotics to Bactrim DS #10, one tab po BID for 10 days. She will return for her 1 month post radiation follow up in about 3 weeks. She is encouraged to call prior to this if her symptoms are worsening. She does have a centrum allergy but confirms she does not have any allergy to sulfa based antibiotics. 2.  Pain secondary to #1. The patient is given a prescription for Norco 5/325 #30, no refills to be taken 1-2 tabs po q 6 hours prn pain. I suspect that her antibiotics will also improve her pain in the next 48 hours. 3.  RUE Lymphedema. She continues under the care of the lymphedema clinic and will have her arm re-wrapped today.     Carola Rhine, PAC

## 2015-08-24 NOTE — Addendum Note (Signed)
Encounter addended by: Benn Moulder, RN on: 08/24/2015  1:13 PM<BR>     Documentation filed: Notes Section, Demographics Visit

## 2015-08-26 ENCOUNTER — Emergency Department (HOSPITAL_COMMUNITY)
Admission: EM | Admit: 2015-08-26 | Discharge: 2015-08-26 | Disposition: A | Discharge status: Home / Self Care | Payer: Medicare HMO | Attending: Emergency Medicine | Admitting: Emergency Medicine

## 2015-08-26 ENCOUNTER — Encounter (HOSPITAL_COMMUNITY): Payer: Self-pay | Admitting: Emergency Medicine

## 2015-08-26 DIAGNOSIS — F1029 Alcohol dependence with unspecified alcohol-induced disorder: Secondary | ICD-10-CM

## 2015-08-26 DIAGNOSIS — F1410 Cocaine abuse, uncomplicated: Secondary | ICD-10-CM | POA: Diagnosis not present

## 2015-08-26 DIAGNOSIS — M1990 Unspecified osteoarthritis, unspecified site: Secondary | ICD-10-CM | POA: Diagnosis not present

## 2015-08-26 DIAGNOSIS — F1920 Other psychoactive substance dependence, uncomplicated: Secondary | ICD-10-CM

## 2015-08-26 DIAGNOSIS — R45851 Suicidal ideations: Secondary | ICD-10-CM | POA: Diagnosis present

## 2015-08-26 DIAGNOSIS — F17210 Nicotine dependence, cigarettes, uncomplicated: Secondary | ICD-10-CM | POA: Diagnosis not present

## 2015-08-26 DIAGNOSIS — F1020 Alcohol dependence, uncomplicated: Secondary | ICD-10-CM | POA: Insufficient documentation

## 2015-08-26 DIAGNOSIS — F419 Anxiety disorder, unspecified: Secondary | ICD-10-CM | POA: Insufficient documentation

## 2015-08-26 DIAGNOSIS — Z79899 Other long term (current) drug therapy: Secondary | ICD-10-CM | POA: Insufficient documentation

## 2015-08-26 DIAGNOSIS — F319 Bipolar disorder, unspecified: Secondary | ICD-10-CM | POA: Insufficient documentation

## 2015-08-26 DIAGNOSIS — K219 Gastro-esophageal reflux disease without esophagitis: Secondary | ICD-10-CM | POA: Diagnosis not present

## 2015-08-26 DIAGNOSIS — Z853 Personal history of malignant neoplasm of breast: Secondary | ICD-10-CM | POA: Insufficient documentation

## 2015-08-26 DIAGNOSIS — Z8701 Personal history of pneumonia (recurrent): Secondary | ICD-10-CM | POA: Insufficient documentation

## 2015-08-26 DIAGNOSIS — Z8619 Personal history of other infectious and parasitic diseases: Secondary | ICD-10-CM | POA: Insufficient documentation

## 2015-08-26 DIAGNOSIS — Z792 Long term (current) use of antibiotics: Secondary | ICD-10-CM | POA: Diagnosis not present

## 2015-08-26 DIAGNOSIS — I10 Essential (primary) hypertension: Secondary | ICD-10-CM | POA: Insufficient documentation

## 2015-08-26 LAB — CBC
HCT: 34.4 % — ABNORMAL LOW (ref 36.0–46.0)
HEMOGLOBIN: 11.7 g/dL — AB (ref 12.0–15.0)
MCH: 28.5 pg (ref 26.0–34.0)
MCHC: 34 g/dL (ref 30.0–36.0)
MCV: 83.7 fL (ref 78.0–100.0)
Platelets: 180 10*3/uL (ref 150–400)
RBC: 4.11 MIL/uL (ref 3.87–5.11)
RDW: 14.5 % (ref 11.5–15.5)
WBC: 5 10*3/uL (ref 4.0–10.5)

## 2015-08-26 LAB — COMPREHENSIVE METABOLIC PANEL
ALBUMIN: 4 g/dL (ref 3.5–5.0)
ALT: 62 U/L — AB (ref 14–54)
AST: 72 U/L — AB (ref 15–41)
Alkaline Phosphatase: 82 U/L (ref 38–126)
Anion gap: 10 (ref 5–15)
BUN: 18 mg/dL (ref 6–20)
CHLORIDE: 114 mmol/L — AB (ref 101–111)
CO2: 20 mmol/L — AB (ref 22–32)
Calcium: 9.4 mg/dL (ref 8.9–10.3)
Creatinine, Ser: 0.94 mg/dL (ref 0.44–1.00)
GFR calc non Af Amer: >60 mL/min (ref >60)
Glucose, Bld: 101 mg/dL — ABNORMAL HIGH (ref 65–99)
Potassium: 3.8 mmol/L (ref 3.5–5.1)
SODIUM: 144 mmol/L (ref 135–145)
Total Bilirubin: 0.4 mg/dL (ref 0.3–1.2)
Total Protein: 7.7 g/dL (ref 6.5–8.1)

## 2015-08-26 LAB — ACETAMINOPHEN LEVEL: Acetaminophen (Tylenol), Serum: <10 ug/mL — ABNORMAL LOW (ref 10–30)

## 2015-08-26 LAB — SALICYLATE LEVEL

## 2015-08-26 LAB — ETHANOL: Alcohol, Ethyl (B): 168 mg/dL — ABNORMAL HIGH (ref <5)

## 2015-08-26 NOTE — ED Notes (Signed)
Pt states she has a problem with drugs and alcohol  Pt states she drinks 3-4 40 oz beers/day and uses crack cocaine  Pt states she last drank this evening and last used crack this morning  Pt states she needs help  When asked if she had any thoughts of hurting herself she states she has contemplated the idea but states "I dont know"

## 2015-08-26 NOTE — ED Provider Notes (Signed)
CSN: [id number]     Arrival date & time 08/26/15  1859 History   First MD Initiated Contact with Patient 08/26/15 2131     Chief Complaint  Patient presents with  . Alcohol Problem  . Addiction Problem   HPI  Marissa Brooks is a 60 y.o. female PMH significant for EtOH abuse, cocaine abuse, right breast cancer, hepatitis C, hypertension, anxiety, depression, bipolar disorder presenting with a request for help for her drug and alcohol addiction. She states that she drinks 3-4 40 ounce beers per day and uses crack cocaine. She endorses using crack this morning and she drank two 40 ounce beers this evening. She denies SI, HI, hallucinations, fevers, chills, chest pain, abdominal pain, nausea, vomiting, change in bowel or bladder habits.  Past Medical History  Diagnosis Date  . Hypertension   . Shortness of breath dyspnea     with walking  . Pneumonia   . Anxiety   . Depression   . Bipolar disorder ([place])   . GERD (gastroesophageal reflux disease)   . Arthritis   . Cancer [facility]) 2002    left breast cancer tx in [place], right breast cancer 08/2014  . ETOH abuse     as of 10/21/14 none for a week  . Cocaine abuse     last use 10/13/14  . Breast cancer ([place]) 09/10/14    right breast,hx left breast ca,2002 [place]  . Allergy   . Tobacco abuse   . HCV (hepatitis C virus)    Past Surgical History  Procedure Laterality Date  . Mastectomy      and Implant  . Mandible fracture surgery    . Breast surgery  2002    left mastectomy with implant  . Tonsillectomy    . Mastectomy w/ sentinel node biopsy Right 10/30/2014  . Simple mastectomy with axillary sentinel node biopsy Right 10/30/2014    Procedure: RIGHT MASTECTOMY WITH SENTINEL NODE MAPPING;  Surgeon: Autumn Messing III, MD;  Location: [facility];  Service: General;  Laterality: Right;  . Esophagogastroduodenoscopy (egd) with propofol N/A 03/17/2015    Procedure: ESOPHAGOGASTRODUODENOSCOPY (EGD) WITH PROPOFOL;  Surgeon: Gatha Mayer, MD;   [facility];  Service: Endoscopy;  Laterality: N/A;   Family History  Problem Relation Age of Onset  . Breast cancer Sister 37  . Mental illness Mother   . HIV/AIDS Sister     Deceased   Social History  Substance Use Topics  . Smoking status: Current Every Day Smoker -- 0.25 packs/day    Types: Cigarettes  . Smokeless tobacco: Never Used  . Alcohol Use: Yes     Comment: 3-4 40 oz/day    OB History    No data available     Review of Systems  Ten systems are reviewed and are negative for acute change except as noted in the HPI  Allergies  Centrum  Home Medications   Prior to Admission medications   Medication Sig Start Date End Date Taking? Authorizing Provider  ARIPiprazole (ABILIFY) 5 MG tablet Take 1 tablet (5 mg total) by mouth daily. For mood control 07/12/13  Yes Encarnacion Slates, NP  gabapentin (NEURONTIN) 100 MG capsule Take 1 capsule (100 mg total) by mouth 3 (three) times daily. 06/15/15  Yes Truitt Merle, MD  hyaluronate sodium (RADIAPLEXRX) GEL Apply 1 application topically 2 (two) times daily. 06/29/15  Yes Kyung Rudd, MD  HYDROcodone-acetaminophen (NORCO) 5-325 MG tablet Take 1-2 tablets by mouth every 6 (six) hours as  needed for moderate pain. 08/24/15  Yes Hayden Pedro, PA-C  lisinopril (PRINIVIL,ZESTRIL) 10 MG tablet Take 10 mg by mouth daily.   Yes Historical Provider, MD  sertraline (ZOLOFT) 100 MG tablet Take 1 tablet (100 mg total) by mouth at bedtime. For depression 07/12/13  Yes Encarnacion Slates, NP  sulfamethoxazole-trimethoprim (BACTRIM DS) 800-160 MG tablet Take 1 tablet by mouth 2 (two) times daily. 08/24/15  Yes Hayden Pedro, PA-C  traZODone (DESYREL) 100 MG tablet Take 1 tablet (100 mg total) by mouth at bedtime. For sleep 07/12/13  Yes Encarnacion Slates, NP  non-metallic deodorant Jethro Poling) MISC Apply 1 application topically daily as needed. Reported on 07/10/2015 06/30/15   Kyung Rudd, MD  ondansetron (ZOFRAN) 8 MG tablet TAKE ONE TABLET   (8 MG  TOTAL) BY MOUTH   TWICE A DAY   START THE DAY AFTER CHEMO FOR THREE   DAYS. THEN TAKE AS NEEDED   FOR NAUSEA OR VOMI Patient not taking: Reported on 08/07/2015 04/03/15   Truitt Merle, MD  pantoprazole (PROTONIX) 40 MG tablet Take 1 tablet (40 mg total) by mouth daily. Patient not taking: Reported on 08/07/2015 03/22/15   Donne Hazel, MD   BP 110/60 mmHg  Pulse 64  Temp(Src) 98.1 F (36.7 C) (Oral)  Resp 20  SpO2 99% Physical Exam  Constitutional: She appears well-developed and well-nourished. No distress.  HENT:  Head: Normocephalic and atraumatic.  Eyes: Conjunctivae are normal. Right eye exhibits no discharge. Left eye exhibits no discharge. No scleral icterus.  Neck: No tracheal deviation present.  Cardiovascular: Normal rate.   Pulmonary/Chest: Effort normal. No respiratory distress.  Abdominal: Soft. Bowel sounds are normal. She exhibits no distension.  Musculoskeletal: She exhibits no edema.  Neurological: She is alert. Coordination normal.  Skin: Skin is warm and dry. No rash noted. She is not diaphoretic. No erythema.  Psychiatric: She has a normal mood and affect. Her behavior is normal.  Nursing note and vitals reviewed.   ED Course  Procedures  Labs Review Labs Reviewed  COMPREHENSIVE METABOLIC PANEL - Abnormal; Notable for the following:    Chloride 114 (*)    CO2 20 (*)    Glucose, Bld 101 (*)    AST 72 (*)    ALT 62 (*)    All other components within normal limits  ETHANOL - Abnormal; Notable for the following:    Alcohol, Ethyl (B) 168 (*)    All other components within normal limits  ACETAMINOPHEN LEVEL - Abnormal; Notable for the following:    Acetaminophen (Tylenol), Serum <10 (*)    All other components within normal limits  CBC - Abnormal; Notable for the following:    Hemoglobin 11.7 (*)    HCT 34.4 (*)    All other components within normal limits  SALICYLATE LEVEL  URINE RAPID DRUG SCREEN, HOSP PERFORMED   MDM   Final diagnoses:  Addiction to  drug ([place])  Alcohol dependence with unspecified alcohol-induced disorder ([place])   Patient nontoxic-appearing, vital signs stable. CMP with elevated liver enzymes-AST is 72, ALT is 62. CBC, salicylate, acetaminophen unremarkable. EtOH is 168, however, patient is clinically sober and is pacing the room. Although it is documented that patient had SI, patient is adamantly denying this with me.  Case discussed with Dr. Johnney Killian regarding elevated liver enzymes and she advised outpatient PCP follow-up. Patient may be safely discharged home at this time with outpatient rehabilitation list. Patient is in understanding and agreement with the plan.  Discussed return precautions.  [place], PA-C 08/27/15 0017  Charlesetta Shanks, MD 08/28/15 1537

## 2015-08-26 NOTE — Discharge Instructions (Signed)
[name],  Nice meeting you! Congrats on wanting to quit alcohol and drugs. Please see the below list of outpatient rehab facilities. Return to the emergency department if you develop fevers, chills, chest pain, abdominal pain. Feel better soon!  S. Wendie Simmer, PA-C   Community Resource Guide Outpatient Counseling/Substance Abuse Adult The [employer] 211 is a great source of information about community services available.  Access by dialing 2-1-1 from anywhere in [place], or by website -  CustodianSupply.fi.   Other Local Resources (Updated 05/2015)  [facility]  Crisis Hotline, available 24 hours a day, 7 days a week: [place], [place]   [facility]  Crisis Hotline, available 24 hours a day, 7 days a week: [place], [place]  [facility]  Suicide Prevention Hotline, available 24 hours a day, 7 days a week: [place], [place], available 24 hours a day, 7 days a week: [place], [place] Access to [employer], available 24 hours a day, 7 days a week: [phone] All   [facility]  Crisis Hotline, available 24 hours a day, 7 days a week: 202-514-3025 All   Other Local Resources (Updated 05/2015)  Outpatient Counseling/ Substance Abuse Programs  Services     Address and Phone Number  ADS (Alcohol and Drug Services)   Options include Individual counseling, group counseling, intensive outpatient program (several hours a day, several days a week)  Offers depression assessments  Provides methadone maintenance program [phone] [address], [place], [place] partial hospitalization/day treatment and DUI/DWI programs  [employer], private insurance [phone] [address],  [phone] [place], [place] [address]  [place] include intensive outpatient program (several hours a day, several days a week), outpatient treatment, DUI/DWI services, family education  Also has some services specifically for [employer] transitional housing  [phone] [address] [place], [place] [address]     [place] Medicare, private pay, and private insurance [phone] [address] [place], [place] [address]  [facility]  Services include individual counseling, substance abuse intensive outpatient program (several hours a day, several days a week), day treatment  [place], Medicaid, private insurance [phone] [address], [place], [place] [address]  [facility]   Offers substance abuse intensive outpatient program (several hours a day, several days a week), partial hospitalization program [phone] [address] [place], [place] [address]  [phone] [address] [place] [place], [place] [address]  [phone] [place], [place] [address]  [phone], [place], [place] [address]  [facility]  Individual counseling only  Accepts private insurance only [phone] [address], [place], [place] [address]  [facility]: Methadone Clinic  Methadone maintenance program [phone] [address] [place], [place] [address]  [facility] providing substance abuse and mental health counseling  Accepts Medicaid, Medicare, private insurance  Offers sliding scale for uninsured [phone] [place], [place] in [place] individual counseling, and intensive in-home services [phone] [address] [place], [place] [address]  [facility] individual  counseling, family counseling, group therapy, domestic violence counseling, consumer credit counseling  Accepts Medicare, Medicaid, private insurance  Offers sliding scale for uninsured [phone] [address] [place], [place] [address]  [phone] [facility], [address] [place], [place]  [facility]  Offers individual, family and group counseling  3 locations - [place], [place], and [place]  [place] [address], [place] [address]  [address] [place], [place] [address]  [place], [place] [address]  [facility] [place] [facility]    Offers psychiatric assessment, 8-week Intensive Outpatient Program (several hours a day, several times a week, daytime or evenings), early recovery group, family Program, medication management  Private pay or private insurance only [phone], or  [phone] [address] [place], [place] [address]  [facility]  Offers individual, couples and family counseling  Accepts Medicaid, private insurance, and sliding scale for uninsured [phone] [address] [place], [place] [address]  Launa Flight, MD  Individual counseling  Private insurance [phone] [place], [place] [address]  [facility]   Offers assessment, substance abuse treatment, and behavioral health treatment [phone] [address] [place], [place] [address]  [place]  Individual counseling  Accepts private insurance [phone] [place], [place] [address]  [facility]  Individual counseling  Blinda [place]ather[place], private insurance [phone] [place], [place] [address]  [place]    Offers intensive outpatient program (several hours a day, several times a week)  Private pay, private insurance [phone] [place], [place]  Individual counseling  Medicare, private insurance [phone] [address], [place], [place] [address]  [place]    Offers intensive outpatient program (several hours a day, several times a week) and partial hospitalization program [phone] [place], [place] [address]  Letta Moynahan, MD  Individual counseling [phone] [address] [place], [place] [address]  [place] counseling to individuals, couples, and families  Accepts Medicare and private insurance; offers sliding scale for uninsured [phone] [place], [place] [address]  [facility]  Christian counseling [phone] [address], [place], [place] [address]  [facility] [employer] crisis counseling, individual counseling, group therapy, in-home therapy, domestic violence services, day treatment, DWI services, Conservation officer, nature (CST), Assertive Community Treatment Team (ACTT), substance abuse Intensive Outpatient Program (several hours a day, several times a week)  2 locations - [place], [place] [address]  [phone] [address] [place], [place] [address]  [place] counseling and group therapy  [place] insurance, [place], [place] [phone] [address] [place], [place]  [facility]  Offers individual and family counseling  Offers LGBTQ services  Accepts private insurance and private pay [phone] [place], [place] [address]  [facility]    Offers individual counseling, group therapy, and outpatient detox  Accepts private insurance [phone] [place], [place] Medicare,  private insurance [phone] [address], [address] [place], [place] [address]  [employer]  Individual counseling  Accepts Medicare, private insurance [phone] [address],  [address]  [facility]   Offers substance abuse Intensive Outpatient Program (several  hours a day, several times a week) [phone], or [place], [place]

## 2015-08-27 ENCOUNTER — Other Ambulatory Visit: Payer: Self-pay | Admitting: Hematology

## 2015-08-28 ENCOUNTER — Encounter: Payer: Self-pay | Admitting: Physical Therapy

## 2015-08-31 ENCOUNTER — Ambulatory Visit: Payer: Medicare HMO | Admitting: Physical Therapy

## 2015-08-31 NOTE — Progress Notes (Signed)
°  Radiation Oncology         [phone] ________________________________  Name: Marissa Brooks MRN: [id number]  Date: 08/07/2015  DOB: 01-01-1956  End of Treatment Note  Diagnosis:   Right- sided breast cancer      Indication for treatment:  Curative       Radiation treatment dates:   06/29/15- 08/07/15  Site/dose:   Right chest wall treated to 48 Gy in 24 fractions, right chest wall treated to 1.8 Gy in 1 fractions, Right chest wall scar boost treated to 10 Gy in 5 fractions.  Beams/energy:   Right chest wall: 3D-breath hold/ 10X, 6X ; Right chest wall scar boost : Electron boost / 6MeV  Narrative: The patient tolerated radiation treatment relatively well.     Plan: The patient has completed radiation treatment. The patient will return to radiation oncology clinic for routine followup in one month. I advised them to call or return sooner if they have any questions or concerns related to their recovery or treatment.  ------------------------------------------------  Jodelle Gross, MD, PhD  This document serves as a record of services personally performed by Kyung Rudd, MD. It was created on his behalf by Derek Mound, a trained medical scribe. The creation of this record is based on the scribe's personal observations and the provider's statements to them. This document has been checked and approved by the attending provider.

## 2015-09-02 ENCOUNTER — Encounter: Payer: Self-pay | Admitting: Physical Therapy

## 2015-09-04 NOTE — Telephone Encounter (Signed)
Reached pt today through emergency contact, [place].  Pt returned call & information about dental office to call was given to pt.

## 2015-09-07 ENCOUNTER — Ambulatory Visit: Payer: Medicare HMO

## 2015-09-07 DIAGNOSIS — I972 Postmastectomy lymphedema syndrome: Secondary | ICD-10-CM

## 2015-09-07 NOTE — Therapy (Addendum)
[place], [place], [address] Phone: [phone]   Fax:  [phone]  Physical Therapy Treatment  Patient Details  Name: Marissa Brooks MRN: [id number] Date of Birth: 16-Aug-1955 Referring Provider: Shona Simpson, PA-C, Dr. Iran Planas  Encounter Date: 09/07/2015      PT End of Session - 09/07/15 1125    Visit Number 12   Number of Visits 24   Date for PT Re-Evaluation 10/12/15   Authorization Type April cert (renewal) done 0000000   PT Start Time 0940   PT Stop Time 1022   PT Time Calculation (min) 42 min   Activity Tolerance Patient tolerated treatment well   Behavior During Therapy [facility] for tasks assessed/performed      Past Medical History  Diagnosis Date  . Hypertension   . Shortness of breath dyspnea     with walking  . Pneumonia   . Anxiety   . Depression   . Bipolar disorder ([place])   . GERD (gastroesophageal reflux disease)   . Arthritis   . Cancer [facility]) 2002    left breast cancer tx in [place], right breast cancer 08/2014  . ETOH abuse     as of 10/21/14 none for a week  . Cocaine abuse     last use 10/13/14  . Breast cancer ([place]) 09/10/14    right breast,hx left breast ca,2002 [place]  . Allergy   . Tobacco abuse   . HCV (hepatitis C virus)     Past Surgical History  Procedure Laterality Date  . Mastectomy      and Implant  . Mandible fracture surgery    . Breast surgery  2002    left mastectomy with implant  . Tonsillectomy    . Mastectomy w/ sentinel node biopsy Right 10/30/2014  . Simple mastectomy with axillary sentinel node biopsy Right 10/30/2014    Procedure: RIGHT MASTECTOMY WITH SENTINEL NODE MAPPING;  Surgeon: Autumn Messing III, MD;  Location: [facility];  Service: General;  Laterality: Right;  . Esophagogastroduodenoscopy (egd) with propofol N/A 03/17/2015    Procedure: ESOPHAGOGASTRODUODENOSCOPY (EGD) WITH PROPOFOL;  Surgeon: Gatha Mayer, MD;  [facility];  Service: Endoscopy;  Laterality: N/A;    There were no vitals filed for this visit.      Subjective Assessment - 09/07/15 0941    Subjective "I enjoyed Easter.  I went to church."  "I had to wait till I healed." Is now done with the antibiotic.  Everything else is okay.     Pertinent History Bilateral mastectomies (left 2005 and right 10/30/14) with right SLNB.  Adjuvant chemotherapy 2016 and currently doing radiation for right chest.  Bipolar disorder managed with medication. Patient admits to alcohol and crack use.   Patient Stated Goals reduce right arm swelling.   Currently in Pain? No/denies               LYMPHEDEMA/ONCOLOGY QUESTIONNAIRE - 09/07/15 0945    Right Upper Extremity Lymphedema   10 cm Proximal to Olecranon Process 33.1 cm   Olecranon Process 28.6 cm   15 cm Proximal to Ulnar Styloid Process 27.4 cm   10 cm Proximal to Ulnar Styloid Process 23.2 cm   Just Proximal to Ulnar Styloid Process 18.5 cm   Across Hand at [employer] 19.2 cm   At [place] of 2nd Digit 6.2 cm  Short Term Clinic Goals - 09/07/15 1215    CC Short Term Goal  #1   Title Patient will verbalize understanding of lymphedema risk reduction practices.   Status Achieved   CC Short Term Goal  #2   Title Patient will reduce right UE at 10 cm proximal to olecranon process to </= 32 cm.   Baseline at 32.1 on 08/14/15, 33.1 09/07/15 (pt has been out for 2 weeks)   Status On-going   CC Short Term Goal  #3   Title Patient will reduce right UE at 10 cm proximal to ulnar styloid process to </= 22 cm.   Baseline 24.2, 07/31/15- 22.1 cm, 23.2 09/07/15 (pt has been out for 2 weeks)   Status On-going   CC Short Term Goal  #4   Title Patient will improve her lymphedema life impact scale score to </= 24 for improved function   Status On-going   CC Short Term Goal  #5   Title Patient will report >/= 25% improvement in right UE pain and tightness.    Status On-going             Long Term Clinic Goals - 07/23/15 1257    CC Long Term Goal  #1   Title Patient will verbalize understanding of where and how to be fitted for a compression sleeve and glove.   Time 8   Period Weeks   Status New   CC Long Term Goal  #2   Title Patient will reduce right UE at 10 cm proximal to olecranon process to </= 30 cm.   Baseline 33.7 cm   Time 8   Period Weeks   Status New   CC Long Term Goal  #3   Title Patient will reduce right UE at 10 cm proximal to ulnar styloid process process to </= 21 cm.   Baseline 24.2 cm   Time 8   Period Weeks   Status New   CC Long Term Goal  #4   Title Patient will improve her lymphedema life impact scale score to </= 20 for improved function   Baseline 29   Time 8   Period Weeks   Status New   CC Long Term Goal  #5   Title Patient will report >/= 50% improvement in right UE pain and tightness.   Time 8   Period Weeks   Status New            Plan - 09/07/15 1128    Clinical Impression Statement Pt saw Serafina Royals, PT at beginning of treatment who determined no mdeical change from pt going to ED last week so resumed complete decongestive therapy today and pt did well with this but is eager to get into her compression garments soon. Her circumference measurements were increased from 2 weeks ago though not by more than 0.7 cm at any spot, so not as bad as when she started. Pt did her remedial exercises and massaged her arm, per her report,  while out past 2 weeks for infection and then medical issue.   Rehab Potential Good   Clinical Impairments Affecting Rehab Potential Drug use and compliance concerns (pt canceled first 2 evals), transportation; had cellulitis at Rt chest wall 08/24/15 and has finished antibiotics   PT Frequency 3x / week   PT Duration 8 weeks   PT Treatment/Interventions Manual lymph drainage;Compression bandaging;Patient/family education;ADLs/Self Care Home Management   PT Next  Visit Plan Repeat lymphedema life impact scale and cont  complete decongestive therapy. Maybe remeasure next visit as well as pt is eager to get measured for garments and was only minimally increased in circumference from 2 week absence.    PT Home Exercise Plan remedial exercises for lymphedema   Consulted and Agree with Plan of Care Patient      Patient will benefit from skilled therapeutic intervention in order to improve the following deficits and impairments:  Increased edema, Pain, Decreased knowledge of precautions, Decreased range of motion, Impaired UE functional use  Visit Diagnosis: Postmastectomy lymphedema syndrome - Plan: PT plan of care cert/re-cert     Problem List Patient Active Problem List   Diagnosis Date Noted  . Lymphedema of upper extremity 08/04/2015  . Shoulder pain, right 08/04/2015  . Radiation dermatitis 08/04/2015  . 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 AKI (acute kidney injury) ([place])   . Esophagitis   . Leukocytosis 03/17/2015  . Acute esophagitis 03/17/2015  . HCAP (healthcare-associated pneumonia) 03/17/2015  . Hematemesis with nausea   . Acute blood loss anemia 03/16/2015  . Dysphagia 03/16/2015  . Hypocalcemia 03/16/2015  . GIB (gastrointestinal bleeding) 03/15/2015  . Tobacco abuse 03/15/2015  . ARF (acute renal failure) ([place]) 03/15/2015  . Hypokalemia 03/15/2015  . Depression 03/15/2015  . Hypertension   . GERD (gastroesophageal reflux disease)   . Anxiety   . Acute kidney injury ([place])   . Essential hypertension   . Gastroesophageal reflux disease without esophagitis   . Breast cancer of upper-outer quadrant of right female breast s/p Right MRM 10/30/14 10/30/2014  . Alcohol dependence ([place]) 07/09/2013  . MDD (major depressive disorder) ([place]) 07/09/2013  . Alcohol abuse with alcohol-induced mood disorder ([place]) 07/08/2013  . 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 OBESITY 05/13/2010  . TONGUE DISORDER 01/20/2010  .  BACK PAIN, LUMBAR 09/03/2008  . THROMBOCYTOPENIA 06/08/2007  . HCV (hepatitis C virus) 06/07/2007  . BIPOLAR AFFECTIVE DISORDER 06/07/2007  . Substance abuse 06/07/2007  . PTSD 06/07/2007  . CONSTIPATION 06/07/2007  . HEPATIC CIRRHOSIS 06/07/2007  . UNSPECIFIED CONCUSSION 06/07/2007  . NEOPLASM, MALIGNANT, BREAST, HX OF 06/07/2007    SALISBURY,DONNA, PTA 09/08/2015, 4:06 PM  [place], [place], [address] Phone: [phone]   Fax:  [phone]  Name: Marissa Brooks MRN: [id number] Date of Birth: [birth year]  Serafina Royals, PT 09/08/2015 4:06 PM

## 2015-09-08 NOTE — Addendum Note (Signed)
Addended by: Jomarie Longs on: 09/08/2015 04:06 PM   Modules accepted: Orders

## 2015-09-09 ENCOUNTER — Ambulatory Visit: Payer: Medicare HMO

## 2015-09-09 DIAGNOSIS — I972 Postmastectomy lymphedema syndrome: Secondary | ICD-10-CM

## 2015-09-09 NOTE — Therapy (Signed)
[place], [place], [address] Phone: [phone]   Fax:  [phone]  Physical Therapy Treatment  Patient Details  Name: Marissa Brooks MRN: [id number] Date of Birth: March 21, 1956 Referring Provider: Shona Simpson, PA-C, Dr. Iran Planas  Encounter Date: 09/09/2015      PT End of Session - 09/09/15 1104    Visit Number 13   Number of Visits 24   Date for PT Re-Evaluation 10/12/15   PT Start Time 0943  Pt thought she had an appt but did not but we were able to work her in.    PT Stop Time 1021   PT Time Calculation (min) 38 min   Activity Tolerance Patient tolerated treatment well   Behavior During Therapy [facility] for tasks assessed/performed      Past Medical History  Diagnosis Date  . Hypertension   . Shortness of breath dyspnea     with walking  . Pneumonia   . Anxiety   . Depression   . Bipolar disorder ([place])   . GERD (gastroesophageal reflux disease)   . Arthritis   . Cancer [facility]) 2002    left breast cancer tx in [place], right breast cancer 08/2014  . ETOH abuse     as of 10/21/14 none for a week  . Cocaine abuse     last use 10/13/14  . Breast cancer ([place]) 09/10/14    right breast,hx left breast ca,2002 [place]  . Allergy   . Tobacco abuse   . HCV (hepatitis C virus)     Past Surgical History  Procedure Laterality Date  . Mastectomy      and Implant  . Mandible fracture surgery    . Breast surgery  2002    left mastectomy with implant  . Tonsillectomy    . Mastectomy w/ sentinel node biopsy Right 10/30/2014  . Simple mastectomy with axillary sentinel node biopsy Right 10/30/2014    Procedure: RIGHT MASTECTOMY WITH SENTINEL NODE MAPPING;  Surgeon: Autumn Messing III, MD;  Location: [place];  Service: General;  Laterality: Right;  . Esophagogastroduodenoscopy (egd) with propofol N/A 03/17/2015    Procedure: ESOPHAGOGASTRODUODENOSCOPY (EGD) WITH PROPOFOL;  Surgeon: Gatha Mayer,  MD;  [facility];  Service: Endoscopy;  Laterality: N/A;    There were no vitals filed for this visit.      Subjective Assessment - 09/09/15 0953    Subjective No changes, doing good. My wrist sill feels a little tight due to the fluid.   Pertinent History Bilateral mastectomies (left 2005 and right 10/30/14) with right SLNB.  Adjuvant chemotherapy 2016 and currently doing radiation for right chest.  Bipolar disorder managed with medication. Patient admits to alcohol and crack use.   Patient Stated Goals reduce right arm swelling.   Currently in Pain? No/denies                         [facility] Adult PT Treatment/Exercise - 09/09/15 0001    Manual Therapy   Manual Lymphatic Drainage (MLD) In supine, short neck, superficial and deep abdominal, left axilla and left anterior interaxillary anastomosis, right groin and right axillo-inguinal anastomosis, right UE from shoulder to fingers directing towards established anatomosis and focused on forearm at area of fibrosis.   Compression Bandaging right UE bandaged with thick stockinette, Elastomull to all five fingers, large dot peach Medi foam (full piece) at forearm and small dot foam at upper forearm and across posterior  elbow, 1/2 inch gray foam piece at antecubital fossa, Artiflex x 1.5, and 3 short stretch bandages from hand to shoulder                   Short Term Clinic Goals - 09/07/15 1215    CC Short Term Goal  #1   Title Patient will verbalize understanding of lymphedema risk reduction practices.   Status Achieved   CC Short Term Goal  #2   Title Patient will reduce right UE at 10 cm proximal to olecranon process to </= 32 cm.   Baseline at 32.1 on 08/14/15, 33.1 09/07/15 (pt has been out for 2 weeks)   Status On-going   CC Short Term Goal  #3   Title Patient will reduce right UE at 10 cm proximal to ulnar styloid process to </= 22 cm.   Baseline 24.2, 07/31/15- 22.1 cm, 23.2 09/07/15 (pt has been out  for 2 weeks)   Status On-going   CC Short Term Goal  #4   Title Patient will improve her lymphedema life impact scale score to </= 24 for improved function   Status On-going   CC Short Term Goal  #5   Title Patient will report >/= 25% improvement in right UE pain and tightness.   Status On-going             Long Term Clinic Goals - 09/09/15 1209    CC Long Term Goal  #1   Title Patient will verbalize understanding of where and how to be fitted for a compression sleeve and glove.   Status Partially Met   CC Long Term Goal  #2   Title Patient will reduce right UE at 10 cm proximal to olecranon process to </= 30 cm.   Baseline 33.7 cm, 33.1 cm 09/09/15 (after 2 week absence)   Status On-going   CC Long Term Goal  #3   Title Patient will reduce right UE at 10 cm proximal to ulnar styloid process process to </= 21 cm.   Baseline 24.2 cm, 23.2 cm 09/09/15 (after 2 week absence)   Status On-going   CC Long Term Goal  #4   Title Patient will improve her lymphedema life impact scale score to </= 20 for improved function   Status On-going   CC Long Term Goal  #5   Title Patient will report >/= 50% improvement in right UE pain and tightness.   Status On-going            Plan - 09/09/15 1205    Clinical Impression Statement Pt came in today thinking she had a scheduled visit but did not, however this therapist had an opening so was able to see her. Pts forearm was palpably softer today and she reported that she could tell the tightnes in her forearm felt looser today as well with wrist AROM. Pt plans to call fitter to hopefully get measured next week.    Rehab Potential Good   Clinical Impairments Affecting Rehab Potential Drug use and compliance concerns (pt canceled first 2 evals), transportation; had cellulitis at Rt chest wall 08/24/15 and has finished antibiotics   PT Frequency 3x / week   PT Duration 8 weeks   PT Treatment/Interventions Manual lymph drainage;Compression  bandaging;Patient/family education;ADLs/Self Care Home Management   PT Next Visit Plan Repeat lymphedema life impact scale and cont complete decongestive therapy.   Consulted and Agree with Plan of Care Patient      Patient will benefit  from skilled therapeutic intervention in order to improve the following deficits and impairments:  Increased edema, Pain, Decreased knowledge of precautions, Decreased range of motion, Impaired UE functional use  Visit Diagnosis: Postmastectomy lymphedema syndrome     Problem List Patient Active Problem List   Diagnosis Date Noted  . Lymphedema of upper extremity 08/04/2015  . Shoulder pain, right 08/04/2015  . Radiation dermatitis 08/04/2015  . Peripheral neuropathy due to chemotherapy ([place]) 06/15/2015  . AKI (acute kidney injury) ([place])   . Esophagitis   . Leukocytosis 03/17/2015  . Acute esophagitis 03/17/2015  . HCAP (healthcare-associated pneumonia) 03/17/2015  . Hematemesis with nausea   . Acute blood loss anemia 03/16/2015  . Dysphagia 03/16/2015  . Hypocalcemia 03/16/2015  . GIB (gastrointestinal bleeding) 03/15/2015  . Tobacco abuse 03/15/2015  . ARF (acute renal failure) ([place]) 03/15/2015  . Hypokalemia 03/15/2015  . Depression 03/15/2015  . Hypertension   . GERD (gastroesophageal reflux disease)   . Anxiety   . Acute kidney injury ([place])   . Essential hypertension   . Gastroesophageal reflux disease without esophagitis   . Breast cancer of upper-outer quadrant of right female breast s/p Right MRM 10/30/14 10/30/2014  . Alcohol dependence ([place]) 07/09/2013  . MDD (major depressive disorder) ([place]) 07/09/2013  . Alcohol abuse with alcohol-induced mood disorder ([place]) 07/08/2013  . 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 OBESITY 05/13/2010  . TONGUE DISORDER 01/20/2010  . BACK PAIN, LUMBAR 09/03/2008  . THROMBOCYTOPENIA 06/08/2007  . HCV (hepatitis C virus) 06/07/2007  . BIPOLAR AFFECTIVE DISORDER 06/07/2007  . Substance abuse 06/07/2007   . PTSD 06/07/2007  . CONSTIPATION 06/07/2007  . HEPATIC CIRRHOSIS 06/07/2007  . UNSPECIFIED CONCUSSION 06/07/2007  . NEOPLASM, MALIGNANT, BREAST, HX OF 06/07/2007    Otelia Limes, PTA 09/09/2015, 12:12 PM  [place], [place], [address] Phone: [phone]   Fax:  [phone]  Name: Marissa Brooks MRN: [id number] Date of Birth: 09/06/1955

## 2015-09-16 ENCOUNTER — Telehealth: Payer: Self-pay | Admitting: *Deleted

## 2015-09-16 ENCOUNTER — Ambulatory Visit: Payer: Medicare HMO

## 2015-09-16 DIAGNOSIS — I972 Postmastectomy lymphedema syndrome: Secondary | ICD-10-CM | POA: Diagnosis not present

## 2015-09-16 NOTE — Patient Instructions (Signed)
Cancer Rehab [phone]  5 circles near neck above collar bones  Deep Effective Breath   Standing, sitting, or laying down, place both hands on the belly. Take a deep breath IN, expanding the belly; then breath OUT, contracting the belly. Repeat __5__ times. Do __2-3__ sessions per day and before your self massage.  http://gt2.exer.us/866   Copyright  VHI. All rights reserved.  Axilla to Axilla - Sweep   On uninvolved side make 5 circles in the armpit, then pump _5__ times from involved armpit across chest to uninvolved armpit, making a pathway. Do _1__ time per day.  Copyright  VHI. All rights reserved.  Axilla to Inguinal Nodes - Sweep   On involved side, make 5 circles at groin at panty line, then pump _5__ times from armpit along side of trunk to outer hip, making your other pathway. Do __1_ time per day.  Copyright  VHI. All rights reserved.  Arm Posterior: Elbow to Shoulder - Sweep   Pump _5__ times from back of elbow to top of shoulder. Then inner to outer upper arm _5_ times, then outer arm again _5_ times. Then back to the pathways _2-3_ times. Do _1__ time per day.  Copyright  VHI. All rights reserved.  ARM: [place] Wrist to Elbow - Sweep   Pump or stationary circles _5__ times from wrist to elbow making sure to do both [place] of the forearm. Then retrace your steps to the outer arm, and the pathways _2-3_ times each. Do _1__ time per day.  Copyright  VHI. All rights reserved.  ARM: Dorsum of Hand to Shoulder - Sweep   Pump or stationary circles _5__ times on back of hand including knuckle spaces and individual fingers if needed working up towards the wrist, then retrace all your steps working back up the forearm, doing both [place]; upper outer arm and back to your pathways _2-3_ times each. Then do 5 circles again at uninvolved armpit and involved groin where you started! Good job!! Do __1_ time per day.  Copyright  VHI. All rights reserved.

## 2015-09-16 NOTE — Therapy (Signed)
[place], [place], [address] Phone: [phone]   Fax:  [phone]  Physical Therapy Treatment  Patient Details  Name: Marissa Brooks MRN: [id number] Date of Birth: Apr 15, 1956 Referring Provider: Shona Simpson, PA-C, Dr. Iran Planas  Encounter Date: 09/16/2015      PT End of Session - 09/16/15 1315    Visit Number 14   Number of Visits 24   Date for PT Re-Evaluation 10/12/15   PT Start Time 1320  Pt got measured before appt so had to start late   PT Stop Time 1351   PT Time Calculation (min) 31 min   Activity Tolerance Patient tolerated treatment well   Behavior During Therapy [facility] for tasks assessed/performed      Past Medical History  Diagnosis Date  . Hypertension   . Shortness of breath dyspnea     with walking  . Pneumonia   . Anxiety   . Depression   . Bipolar disorder ([place])   . GERD (gastroesophageal reflux disease)   . Arthritis   . Cancer [facility]) 2002    left breast cancer tx in [place], right breast cancer 08/2014  . ETOH abuse     as of 10/21/14 none for a week  . Cocaine abuse     last use 10/13/14  . Breast cancer ([place]) 09/10/14    right breast,hx left breast ca,2002 [place]  . Allergy   . Tobacco abuse   . HCV (hepatitis C virus)     Past Surgical History  Procedure Laterality Date  . Mastectomy      and Implant  . Mandible fracture surgery    . Breast surgery  2002    left mastectomy with implant  . Tonsillectomy    . Mastectomy w/ sentinel node biopsy Right 10/30/2014  . Simple mastectomy with axillary sentinel node biopsy Right 10/30/2014    Procedure: RIGHT MASTECTOMY WITH SENTINEL NODE MAPPING;  Surgeon: Autumn Messing III, MD;  Location: [place];  Service: General;  Laterality: Right;  . Esophagogastroduodenoscopy (egd) with propofol N/A 03/17/2015    Procedure: ESOPHAGOGASTRODUODENOSCOPY (EGD) WITH PROPOFOL;  Surgeon: Gatha Mayer, MD;  [facility];  Service: Endoscopy;  Laterality: N/A;    There were no vitals filed for this visit.      Subjective Assessment - 09/16/15 1309    Subjective I got measured for my compression sleeve today! No change since I was here last.    Pertinent History Bilateral mastectomies (left 2005 and right 10/30/14) with right SLNB.  Adjuvant chemotherapy 2016 and currently doing radiation for right chest.  Bipolar disorder managed with medication. Patient admits to alcohol and crack use.   Patient Stated Goals reduce right arm swelling.   Currently in Pain? No/denies                         [facility] Adult PT Treatment/Exercise - 09/16/15 0001    Manual Therapy   Manual Lymphatic Drainage (MLD) In supine, short neck, superficial and deep abdominal, left axilla and left anterior interaxillary anastomosis, right groin and right axillo-inguinal anastomosis, right UE from shoulder to fingers directing towards established anatomosis and focused on forearm at area of fibrosis.   Compression Bandaging right UE bandaged with thick stockinette, Elastomull to all five fingers, large dot peach Medi foam (full piece) at forearm and small dot foam at upper forearm and across posterior elbow, 1/2 inch gray foam piece at  antecubital fossa, Artiflex x 1.5, and 3 short stretch bandages from hand to shoulder                PT Education - 09/16/15 1358    Education provided Yes   Education Details Self manual lymph drainage   Person(s) Educated Patient   Methods Explanation;Demonstration;Handout   Comprehension Verbalized understanding;Need further instruction           Short Term Clinic Goals - 09/07/15 1215    CC Short Term Goal  #1   Title Patient will verbalize understanding of lymphedema risk reduction practices.   Status Achieved   CC Short Term Goal  #2   Title Patient will reduce right UE at 10 cm proximal to olecranon process to </= 32 cm.   Baseline at 32.1 on 08/14/15, 33.1  09/07/15 (pt has been out for 2 weeks)   Status On-going   CC Short Term Goal  #3   Title Patient will reduce right UE at 10 cm proximal to ulnar styloid process to </= 22 cm.   Baseline 24.2, 07/31/15- 22.1 cm, 23.2 09/07/15 (pt has been out for 2 weeks)   Status On-going   CC Short Term Goal  #4   Title Patient will improve her lymphedema life impact scale score to </= 24 for improved function   Status On-going   CC Short Term Goal  #5   Title Patient will report >/= 25% improvement in right UE pain and tightness.   Status On-going             Long Term Clinic Goals - 09/09/15 1209    CC Long Term Goal  #1   Title Patient will verbalize understanding of where and how to be fitted for a compression sleeve and glove.   Status Partially Met   CC Long Term Goal  #2   Title Patient will reduce right UE at 10 cm proximal to olecranon process to </= 30 cm.   Baseline 33.7 cm, 33.1 cm 09/09/15 (after 2 week absence)   Status On-going   CC Long Term Goal  #3   Title Patient will reduce right UE at 10 cm proximal to ulnar styloid process process to </= 21 cm.   Baseline 24.2 cm, 23.2 cm 09/09/15 (after 2 week absence)   Status On-going   CC Long Term Goal  #4   Title Patient will improve her lymphedema life impact scale score to </= 20 for improved function   Status On-going   CC Long Term Goal  #5   Title Patient will report >/= 50% improvement in right UE pain and tightness.   Status On-going            Plan - 09/16/15 1316    Clinical Impression Statement Pt was measured for her daytime compression sleeve and glove and nighttime Reidsleeve garment.  Didn't have time to measure as pt was getting measured for compression garments at beginning of session. Her day time garments should arrive in 7-10 business days and nighttime garment in 3-4 weeks.    Rehab Potential Good   Clinical Impairments Affecting Rehab Potential Drug use and compliance concerns (pt canceled first 2 evals),  transportation; had cellulitis at Rt chest wall 08/24/15 and has finished antibiotics   PT Frequency 3x / week   PT Duration 8 weeks   PT Treatment/Interventions Manual lymph drainage;Compression bandaging;Patient/family education;ADLs/Self Care Home Management   PT Next Visit Plan Repeat lymphedema life impact scale and cont  complete decongestive therapy until compression garments arrive.    PT Home Exercise Plan Self manual lymph drainage when bandages are off between appts.    Consulted and Agree with Plan of Care Patient      Patient will benefit from skilled therapeutic intervention in order to improve the following deficits and impairments:  Increased edema, Pain, Decreased knowledge of precautions, Decreased range of motion, Impaired UE functional use  Visit Diagnosis: Postmastectomy lymphedema syndrome     Problem List Patient Active Problem List   Diagnosis Date Noted  . Lymphedema of upper extremity 08/04/2015  . Shoulder pain, right 08/04/2015  . Radiation dermatitis 08/04/2015  . Peripheral neuropathy due to chemotherapy ([place]) 06/15/2015  . AKI (acute kidney injury) ([place])   . Esophagitis   . Leukocytosis 03/17/2015  . Acute esophagitis 03/17/2015  . HCAP (healthcare-associated pneumonia) 03/17/2015  . Hematemesis with nausea   . Acute blood loss anemia 03/16/2015  . Dysphagia 03/16/2015  . Hypocalcemia 03/16/2015  . GIB (gastrointestinal bleeding) 03/15/2015  . Tobacco abuse 03/15/2015  . ARF (acute renal failure) ([place]) 03/15/2015  . Hypokalemia 03/15/2015  . Depression 03/15/2015  . Hypertension   . GERD (gastroesophageal reflux disease)   . Anxiety   . Acute kidney injury ([place])   . Essential hypertension   . Gastroesophageal reflux disease without esophagitis   . Breast cancer of upper-outer quadrant of right female breast s/p Right MRM 10/30/14 10/30/2014  . Alcohol dependence ([place]) 07/09/2013  . MDD (major depressive disorder) ([place]) 07/09/2013  . Alcohol  abuse with alcohol-induced mood disorder ([place]) 07/08/2013  . 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 OBESITY 05/13/2010  . TONGUE DISORDER 01/20/2010  . BACK PAIN, LUMBAR 09/03/2008  . THROMBOCYTOPENIA 06/08/2007  . HCV (hepatitis C virus) 06/07/2007  . BIPOLAR AFFECTIVE DISORDER 06/07/2007  . Substance abuse 06/07/2007  . PTSD 06/07/2007  . CONSTIPATION 06/07/2007  . HEPATIC CIRRHOSIS 06/07/2007  . UNSPECIFIED CONCUSSION 06/07/2007  . NEOPLASM, MALIGNANT, BREAST, HX OF 06/07/2007    Otelia Limes, PTA 09/16/2015, 2:01 PM  [place], [place], [address] Phone: [phone]   Fax:  [phone]  Name: Marissa Brooks MRN: [id number] Date of Birth: 1955/07/03

## 2015-09-16 NOTE — Telephone Encounter (Signed)
Called patient - no answer, talked to Corporate treasurer which said no one is living in that room. Tried the other # no answer as well.

## 2015-09-17 ENCOUNTER — Encounter: Payer: Self-pay | Admitting: Radiation Oncology

## 2015-09-17 ENCOUNTER — Ambulatory Visit
Admission: RE | Admit: 2015-09-17 | Discharge: 2015-09-17 | Disposition: A | Discharge status: Home / Self Care | Payer: Medicare HMO | Source: Ambulatory Visit | Attending: Radiation Oncology | Admitting: Radiation Oncology

## 2015-09-17 VITALS — BP 109/60 | HR 53 | Temp 98.6°F | Ht 62.0 in | Wt 172.1 lb

## 2015-09-17 DIAGNOSIS — C50411 Malignant neoplasm of upper-outer quadrant of right female breast: Secondary | ICD-10-CM

## 2015-09-17 NOTE — Progress Notes (Signed)
Marissa Brooks here for reassessment of her tx field. Note Hyperpigmentation in the tx field and hypopigmentation in the incisional area with a small scab.   She reports intermittent level 7/10 pain - "zingers" and muscle spasms in the incisional area.  Using Radiaplex Gel

## 2015-09-17 NOTE — Progress Notes (Addendum)
Radiation Oncology         [phone] ________________________________  Name: Marissa Brooks MRN: [id number]  Date: 09/17/2015  DOB: 02-05-1956  Follow-Up Visit Note  CC: Marissa Merino, MD  Truitt Merle, MD  Diagnosis:   Breast cancer of upper-outer quadrant of right female breast s/p Right MRM 10/30/14   Interval Since Last Radiation:  5 weeks  06/29/15-08/07/15: Right chest wall: 49.8 Gy with 10 Gy boost to the mastectomy scar.  Narrative:   Marissa Brooks is a very pleasant 60 y.o. breast cancer patient who received radiation to the right chest wall and mastectomy scar. She completed all her treatment on 08/07/2015, she was seen however in the emergency department on 08/17/2015 where she was diagnosed with an early cellulitis and was sent home with  Keflex. She presented for follow-up, several days later, but her infection had not fully resolved. She was switched to Bactrim, and given a prescription for narcotic pain medication. She comes today for her 1 month follow-up from radiation treatment.  On review of systems, the patient states that her skin is doing much better than it has been the last saw her. She states that she is trying to use her radial complex now, as well as Neosporin. She continues to work with lymphedema clinic and has been moving towards a goal of being able to her sleeve rather than having to continue tight wrapping. She states that she has been experiencing persistent pain along the right chest wall, and states that this is been a burning and sometimes sharp shooting pain. She does take 200 mg 3 times a day gabapentin. She denies any chest tightness or sternal pain. She is not experiencing shortness of breath, fevers, chills. A complete review of systems is obtained and is otherwise negative.  ALLERGIES:  is allergic to centrum.  Meds: Current Outpatient Prescriptions  Medication Sig Dispense Refill  . ARIPiprazole (ABILIFY) 5 MG tablet Take 1 tablet (5 mg total) by mouth  daily. For mood control 30 tablet 0  . gabapentin (NEURONTIN) 100 MG capsule Take 2 capsules (200 mg total) by mouth 3 (three) times daily. 180 capsule 2  . hyaluronate sodium (RADIAPLEXRX) GEL Apply 1 application topically 2 (two) times daily.    Marland Kitchen HYDROcodone-acetaminophen (NORCO) 5-325 MG tablet Take 1-2 tablets by mouth every 6 (six) hours as needed for moderate pain. 30 tablet 0  . lisinopril (PRINIVIL,ZESTRIL) 10 MG tablet Take 10 mg by mouth daily.    . non-metallic deodorant Jethro Poling) MISC Apply 1 application topically daily as needed. Reported on 07/10/2015    . sertraline (ZOLOFT) 100 MG tablet Take 1 tablet (100 mg total) by mouth at bedtime. For depression 30 tablet 0  . traZODone (DESYREL) 100 MG tablet Take 1 tablet (100 mg total) by mouth at bedtime. For sleep 30 tablet 0   No current facility-administered medications for this encounter.    Physical Findings:  height is 5\' 2"  (1.575 m) and weight is 172 lb 1.6 oz (78.064 kg). Her temperature is 98.6 F (37 C). Her blood pressure is 109/60 and her pulse is 53.   Pain scale 7/10 In general this is a well appearing African-American in no acute distress. She's alert and oriented x4 and appropriate throughout the examination. Cardiopulmonary assessment is negative for acute distress and she exhibits normal effort. The chest wall along the right side is evaluated and There is no additional change concerning for moist desquamation, the skin has epithelialized, there is no cellulitic change, bleeding,  or drainage.  Lab Findings: Lab Results  Component Value Date   WBC 5.0 08/26/2015   HGB 11.7* 08/26/2015   HCT 34.4* 08/26/2015   MCV 83.7 08/26/2015   PLT 180 08/26/2015     Radiographic Findings: No results found.  Impression/Plan: 1. Right breast cancer. Her infection has resolved, and we discussed that she can continue to use the radial complex at we have provided her as well as now ad vitamin E oil for massage. She states  agreement and understanding. We would be happy to see her back in the future if she has any questions regarding her previous radiotherapy, or if she needed additional radiation to her.. 2.  Neuropathic pain along incision site. I did encourage the patient to increase her gabapentin 300 mg times a day, and to keep [place] informed that this is not improving. I do not feel that narcotic medication is what she needs at this point in time, and if her pain is not well managed with gabapentin, consideration of Lyrica versus a referral to a pain specialist for possible nerve block might be of greatest benefit to her. 3.  RUE Lymphedema. The patient continues with physical therapy, and we have encouraged her to consider further recommendations will continue with the goals of maintaining functionality 4. Survivorship. The patient has follow-up with Chestine Spore, NP at the end of May, and follow-up as well with Dr. Burr Medico.     Carola Rhine, PAC

## 2015-09-18 ENCOUNTER — Ambulatory Visit: Payer: Medicare HMO | Admitting: Physical Therapy

## 2015-09-18 DIAGNOSIS — I972 Postmastectomy lymphedema syndrome: Secondary | ICD-10-CM | POA: Diagnosis not present

## 2015-09-18 NOTE — Therapy (Signed)
[place], [place], [address] Phone: [phone]   Fax:  [phone]  Physical Therapy Treatment  Patient Details  Name: Marissa Brooks MRN: [id number] Date of Birth: [birth year] Referring Provider: Shona Simpson, PA-C, Dr. Iran Planas  Encounter Date: 09/18/2015      PT End of Session - 09/18/15 1302    Visit Number 15   Number of Visits 24   Date for PT Re-Evaluation 10/12/15   Authorization Type April cert (renewal) done 0000000   PT Start Time 0754   PT Stop Time 0840   PT Time Calculation (min) 46 min   Activity Tolerance Patient tolerated treatment well   Behavior During Therapy [facility] for tasks assessed/performed      Past Medical History  Diagnosis Date  . Hypertension   . Shortness of breath dyspnea     with walking  . Pneumonia   . Anxiety   . Depression   . Bipolar disorder ([place])   . GERD (gastroesophageal reflux disease)   . Arthritis   . Cancer [facility]) 2002    left breast cancer tx in [place], right breast cancer 08/2014  . ETOH abuse     as of 10/21/14 none for a week  . Cocaine abuse     last use 10/13/14  . Breast cancer ([place]) 09/10/14    right breast,hx left breast ca,2002 [place]  . Allergy   . Tobacco abuse   . HCV (hepatitis C virus)     Past Surgical History  Procedure Laterality Date  . Mastectomy      and Implant  . Mandible fracture surgery    . Breast surgery  2002    left mastectomy with implant  . Tonsillectomy    . Mastectomy w/ sentinel node biopsy Right 10/30/2014  . Simple mastectomy with axillary sentinel node biopsy Right 10/30/2014    Procedure: RIGHT MASTECTOMY WITH SENTINEL NODE MAPPING;  Surgeon: Autumn Messing III, MD;  Location: [place];  Service: General;  Laterality: Right;  . Esophagogastroduodenoscopy (egd) with propofol N/A 03/17/2015    Procedure: ESOPHAGOGASTRODUODENOSCOPY (EGD) WITH PROPOFOL;  Surgeon: Gatha Mayer, MD;  [facility];  Service: Endoscopy;  Laterality: N/A;    There were no vitals filed for this visit.      Subjective Assessment - 09/18/15 0754    Subjective Nothing new.  Putting up with bandages.   Currently in Pain? No/denies            [facility] PT Assessment - 09/18/15 0001    Observation/Other Assessments   Other Surveys  --  lymphedema life impact scale score is 18, or 16% impairment           LYMPHEDEMA/ONCOLOGY QUESTIONNAIRE - 09/18/15 0759    Right Upper Extremity Lymphedema   10 cm Proximal to Olecranon Process 32.4 cm   Olecranon Process 27 cm   15 cm Proximal to Ulnar Styloid Process 26.2 cm   10 cm Proximal to Ulnar Styloid Process 23.7 cm   Just Proximal to Ulnar Styloid Process 17.9 cm   Across Hand at [employer] 19.3 cm   At [place] of 2nd Digit 6.1 cm                  [facility] Adult PT Treatment/Exercise - 09/18/15 0001    Manual Therapy   Manual therapy comments Removed bandages, skin check is fine.  There were indentations on right forearm from peach Medi large dot  foam, and tissue there was a mix of soft and indurated..   Edema Management circumference measurements taken   Manual Lymphatic Drainage (MLD) In supine, short neck, superficial and deep abdominal, left axilla and left anterior interaxillary anastomosis, right groin and right axillo-inguinal anastomosis, right UE from shoulder to fingers directing towards established anatomosis and focused on forearm at area of fibrosis.   Compression Bandaging right UE bandaged with thick stockinette, Elastomull to all five fingers, small dot Medi foam at forearm, 1/2 inch gray foam piece at antecubital fossa, Artiflex x 1.5, and 4 short stretch bandages from hand to shoulder                   Short Term Clinic Goals - 09/18/15 1306    CC Short Term Goal  #2   Title Patient will reduce right UE at 10 cm proximal to olecranon process to </= 32 cm.   Status On-going   CC Short Term Goal  #3    Title Patient will reduce right UE at 10 cm proximal to ulnar styloid process to </= 22 cm.   Status On-going   CC Short Term Goal  #4   Title Patient will improve her lymphedema life impact scale score to </= 24 for improved function   Status Achieved   CC Short Term Goal  #5   Title Patient will report >/= 25% improvement in right UE pain and tightness.   Status On-going             Long Term Clinic Goals - 09/18/15 1304    CC Long Term Goal  #1   Title Patient will verbalize understanding of where and how to be fitted for a compression sleeve and glove.   Status Achieved   CC Long Term Goal  #2   Title Patient will reduce right UE at 10 cm proximal to olecranon process to </= 30 cm.   Baseline 33.7 cm, 33.1 cm 09/09/15 (after 2 week absence); 32.4 on 09/18/15   Status On-going   CC Long Term Goal  #3   Title Patient will reduce right UE at 10 cm proximal to ulnar styloid process process to </= 21 cm.   Status On-going   CC Long Term Goal  #4   Title Patient will improve her lymphedema life impact scale score to </= 20 for improved function   Baseline 29 at eval; 18 on 09/18/15   Status Achieved   CC Long Term Goal  #5   Title Patient will report >/= 50% improvement in right UE pain and tightness.   Status On-going            Plan - 09/18/15 1302    Clinical Impression Statement Reductions at upper forearm to above elbow today.   Rehab Potential Good   Clinical Impairments Affecting Rehab Potential Drug use and compliance concerns (pt canceled first 2 evals), transportation; had cellulitis at Rt chest wall 08/24/15 and has finished antibiotics   PT Frequency 3x / week   PT Duration 8 weeks   PT Treatment/Interventions Manual lymph drainage;Compression bandaging;Patient/family education;ADLs/Self Care Home Management   PT Next Visit Plan Check patient's perception of percent improvement in UE tightness.  Continue complete decongestive therapy until garments arrive.   PT  Home Exercise Plan Self manual lymph drainage when bandages are off between appts. Wash bandages this weekend.      Patient will benefit from skilled therapeutic intervention in order to improve the following deficits and  impairments:  Increased edema, Pain, Decreased knowledge of precautions, Decreased range of motion, Impaired UE functional use  Visit Diagnosis: Postmastectomy lymphedema syndrome     Problem List Patient Active Problem List   Diagnosis Date Noted  . Lymphedema of upper extremity 08/04/2015  . Shoulder pain, right 08/04/2015  . Radiation dermatitis 08/04/2015  . Peripheral neuropathy due to chemotherapy ([place]) 06/15/2015  . AKI (acute kidney injury) ([place])   . Esophagitis   . Leukocytosis 03/17/2015  . Acute esophagitis 03/17/2015  . HCAP (healthcare-associated pneumonia) 03/17/2015  . Hematemesis with nausea   . Acute blood loss anemia 03/16/2015  . Dysphagia 03/16/2015  . Hypocalcemia 03/16/2015  . GIB (gastrointestinal bleeding) 03/15/2015  . Tobacco abuse 03/15/2015  . ARF (acute renal failure) ([place]) 03/15/2015  . Hypokalemia 03/15/2015  . Depression 03/15/2015  . Hypertension   . GERD (gastroesophageal reflux disease)   . Anxiety   . Acute kidney injury ([place])   . Essential hypertension   . Gastroesophageal reflux disease without esophagitis   . Breast cancer of upper-outer quadrant of right female breast s/p Right MRM 10/30/14 10/30/2014  . Alcohol dependence ([place]) 07/09/2013  . MDD (major depressive disorder) ([place]) 07/09/2013  . Alcohol abuse with alcohol-induced mood disorder ([place]) 07/08/2013  . 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 OBESITY 05/13/2010  . TONGUE DISORDER 01/20/2010  . BACK PAIN, LUMBAR 09/03/2008  . THROMBOCYTOPENIA 06/08/2007  . HCV (hepatitis C virus) 06/07/2007  . BIPOLAR AFFECTIVE DISORDER 06/07/2007  . Substance abuse 06/07/2007  . PTSD 06/07/2007  . CONSTIPATION 06/07/2007  . HEPATIC CIRRHOSIS 06/07/2007  . UNSPECIFIED  CONCUSSION 06/07/2007  . NEOPLASM, MALIGNANT, BREAST, HX OF 06/07/2007    SALISBURY,DONNA 09/18/2015, 1:08 PM  [place], [place], [address] Phone: [phone]   Fax:  [phone]  Name: KEYONNI LORAH MRN: [id number] Date of Birth: 1955-06-06    Serafina Royals, PT 09/18/2015 1:08 PM

## 2015-09-23 ENCOUNTER — Ambulatory Visit: Payer: Medicare HMO | Attending: Radiation Oncology

## 2015-09-23 DIAGNOSIS — M79601 Pain in right arm: Secondary | ICD-10-CM | POA: Insufficient documentation

## 2015-09-23 DIAGNOSIS — I972 Postmastectomy lymphedema syndrome: Secondary | ICD-10-CM

## 2015-09-23 NOTE — Therapy (Signed)
[place], [place], [address] Phone: [phone]   Fax:  [phone]  Physical Therapy Treatment  Patient Details  Name: Marissa Brooks MRN: [id number] Date of Birth: August 09, 1955 Referring Provider: Shona Simpson, PA-C, Dr. Iran Planas  Encounter Date: 09/23/2015      PT End of Session - 09/23/15 1031    Visit Number 16   Number of Visits 24   Date for PT Re-Evaluation 10/12/15   PT Start Time 0940   PT Stop Time 1027   PT Time Calculation (min) 47 min   Activity Tolerance Patient tolerated treatment well   Behavior During Therapy [facility] for tasks assessed/performed      Past Medical History  Diagnosis Date  . Hypertension   . Shortness of breath dyspnea     with walking  . Pneumonia   . Anxiety   . Depression   . Bipolar disorder ([place])   . GERD (gastroesophageal reflux disease)   . Arthritis   . Cancer [facility]) 2002    left breast cancer tx in [place], right breast cancer 08/2014  . ETOH abuse     as of 10/21/14 none for a week  . Cocaine abuse     last use 10/13/14  . Breast cancer ([place]) 09/10/14    right breast,hx left breast ca,2002 [place]  . Allergy   . Tobacco abuse   . HCV (hepatitis C virus)     Past Surgical History  Procedure Laterality Date  . Mastectomy      and Implant  . Mandible fracture surgery    . Breast surgery  2002    left mastectomy with implant  . Tonsillectomy    . Mastectomy w/ sentinel node biopsy Right 10/30/2014  . Simple mastectomy with axillary sentinel node biopsy Right 10/30/2014    Procedure: RIGHT MASTECTOMY WITH SENTINEL NODE MAPPING;  Surgeon: Autumn Messing III, MD;  Location: [place];  Service: General;  Laterality: Right;  . Esophagogastroduodenoscopy (egd) with propofol N/A 03/17/2015    Procedure: ESOPHAGOGASTRODUODENOSCOPY (EGD) WITH PROPOFOL;  Surgeon: Gatha Mayer, MD;  [facility];  Service: Endoscopy;  Laterality: N/A;     There were no vitals filed for this visit.      Subjective Assessment - 09/23/15 0943    Subjective I left my bandages on until this morning right before I came. My wrist still feels tight when I bend it.    Pertinent History Bilateral mastectomies (left 2005 and right 10/30/14) with right SLNB.  Adjuvant chemotherapy 2016 and currently doing radiation for right chest.  Bipolar disorder managed with medication. Patient admits to alcohol and crack use.   Patient Stated Goals reduce right arm swelling.   Currently in Pain? No/denies                         [facility] Adult PT Treatment/Exercise - 09/23/15 0001    Manual Therapy   Manual Lymphatic Drainage (MLD) In supine, short neck, superficial and deep abdominal, left axilla and left anterior interaxillary anastomosis, right groin and right axillo-inguinal anastomosis, right UE from shoulder to fingers directing towards established anatomosis and focused on forearm at area of fibrosis.   Compression Bandaging right UE bandaged with thick stockinette, Elastomull to all five fingers, large dot Medi foam at forearm, 1/2 inch gray foam piece at antecubital fossa, small dot Medi foam at upper foream, Artiflex x 1.5, and 4 short stretch bandages from hand  to shoulder using herring bone pattern with last bandage.                   Short Term Clinic Goals - 09/18/15 1306    CC Short Term Goal  #2   Title Patient will reduce right UE at 10 cm proximal to olecranon process to </= 32 cm.   Status On-going   CC Short Term Goal  #3   Title Patient will reduce right UE at 10 cm proximal to ulnar styloid process to </= 22 cm.   Status On-going   CC Short Term Goal  #4   Title Patient will improve her lymphedema life impact scale score to </= 24 for improved function   Status Achieved   CC Short Term Goal  #5   Title Patient will report >/= 25% improvement in right UE pain and tightness.   Status On-going              Long Term Clinic Goals - 09/18/15 1304    CC Long Term Goal  #1   Title Patient will verbalize understanding of where and how to be fitted for a compression sleeve and glove.   Status Achieved   CC Long Term Goal  #2   Title Patient will reduce right UE at 10 cm proximal to olecranon process to </= 30 cm.   Baseline 33.7 cm, 33.1 cm 09/09/15 (after 2 week absence); 32.4 on 09/18/15   Status On-going   CC Long Term Goal  #3   Title Patient will reduce right UE at 10 cm proximal to ulnar styloid process process to </= 21 cm.   Status On-going   CC Long Term Goal  #4   Title Patient will improve her lymphedema life impact scale score to </= 20 for improved function   Baseline 29 at eval; 18 on 09/18/15   Status Achieved   CC Long Term Goal  #5   Title Patient will report >/= 50% improvement in right UE pain and tightness.   Status On-going            Plan - 09/23/15 1032    Clinical Impression Statement Pt had just taken her bandages off this morning from Friday appt. Did not measure today though pts fibrosis continues to slowly improve by palpation at forearm, but pt reports it still feels tight with AROM and does not feel this has improved. Skin discoloration (darkening) noted at anterior forearm today as well that pt reported hasn't been there before. Is right over area of fibrosis and instructed pt that this can happen over areas of fibrosis. Pt overall is continuing to tolerate bandaging well and will benfit from continuing therapy until her garments arrive to maintain her progress thus far.    Rehab Potential Good   Clinical Impairments Affecting Rehab Potential Drug use and compliance concerns (pt canceled first 2 evals), transportation; had cellulitis at Rt chest wall 08/24/15 and has finished antibiotics   PT Frequency 3x / week   PT Duration 8 weeks   PT Treatment/Interventions Manual lymph drainage;Compression bandaging;Patient/family education;ADLs/Self Care Home Management   PT  Next Visit Plan Measure circumference next visit and assess goals. Continue complete decongestive therapy until garments arrive.   Consulted and Agree with Plan of Care Patient      Patient will benefit from skilled therapeutic intervention in order to improve the following deficits and impairments:  Increased edema, Pain, Decreased knowledge of precautions, Decreased range of motion, Impaired  UE functional use  Visit Diagnosis: Postmastectomy lymphedema syndrome     Problem List Patient Active Problem List   Diagnosis Date Noted  . Lymphedema of upper extremity 08/04/2015  . Shoulder pain, right 08/04/2015  . Radiation dermatitis 08/04/2015  . Peripheral neuropathy due to chemotherapy ([place]) 06/15/2015  . AKI (acute kidney injury) ([place])   . Esophagitis   . Leukocytosis 03/17/2015  . Acute esophagitis 03/17/2015  . HCAP (healthcare-associated pneumonia) 03/17/2015  . Hematemesis with nausea   . Acute blood loss anemia 03/16/2015  . Dysphagia 03/16/2015  . Hypocalcemia 03/16/2015  . GIB (gastrointestinal bleeding) 03/15/2015  . Tobacco abuse 03/15/2015  . ARF (acute renal failure) ([place]) 03/15/2015  . Hypokalemia 03/15/2015  . Depression 03/15/2015  . Hypertension   . GERD (gastroesophageal reflux disease)   . Anxiety   . Acute kidney injury ([place])   . Essential hypertension   . Gastroesophageal reflux disease without esophagitis   . Breast cancer of upper-outer quadrant of right female breast s/p Right MRM 10/30/14 10/30/2014  . Alcohol dependence ([place]) 07/09/2013  . MDD (major depressive disorder) ([place]) 07/09/2013  . Alcohol abuse with alcohol-induced mood disorder ([place]) 07/08/2013  . 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 OBESITY 05/13/2010  . TONGUE DISORDER 01/20/2010  . BACK PAIN, LUMBAR 09/03/2008  . THROMBOCYTOPENIA 06/08/2007  . HCV (hepatitis C virus) 06/07/2007  . BIPOLAR AFFECTIVE DISORDER 06/07/2007  . Substance abuse 06/07/2007  . PTSD 06/07/2007  .  CONSTIPATION 06/07/2007  . HEPATIC CIRRHOSIS 06/07/2007  . UNSPECIFIED CONCUSSION 06/07/2007  . NEOPLASM, MALIGNANT, BREAST, HX OF 06/07/2007    Otelia Limes, PTA 09/23/2015, 10:38 AM  [place], [place], [address] Phone: [phone]   Fax:  [phone]  Name: IRENE MACBETH MRN: [id number] Date of Birth: [birth year]

## 2015-09-25 ENCOUNTER — Ambulatory Visit: Payer: Medicare HMO | Admitting: Physical Therapy

## 2015-09-25 DIAGNOSIS — I972 Postmastectomy lymphedema syndrome: Secondary | ICD-10-CM | POA: Diagnosis not present

## 2015-09-25 DIAGNOSIS — M79601 Pain in right arm: Secondary | ICD-10-CM

## 2015-09-25 NOTE — Therapy (Signed)
[place], [place], [address] Phone: [phone]   Fax:  [phone]  Physical Therapy Treatment  Patient Details  Name: Marissa Brooks MRN: [id number] Date of Birth: 1956-05-20 Referring Provider: Shona Simpson, PA-C, Dr. Iran Planas  Encounter Date: 09/25/2015      PT End of Session - 09/25/15 1148    Visit Number 17   Number of Visits 24   Date for PT Re-Evaluation 10/12/15   PT Start Time 0935   PT Stop Time 1015   PT Time Calculation (min) 40 min   Activity Tolerance Patient tolerated treatment well   Behavior During Therapy [facility] for tasks assessed/performed      Past Medical History  Diagnosis Date  . Hypertension   . Shortness of breath dyspnea     with walking  . Pneumonia   . Anxiety   . Depression   . Bipolar disorder ([place])   . GERD (gastroesophageal reflux disease)   . Arthritis   . Cancer [facility]) 2002    left breast cancer tx in [place], right breast cancer 08/2014  . ETOH abuse     as of 10/21/14 none for a week  . Cocaine abuse     last use 10/13/14  . Breast cancer ([place]) 09/10/14    right breast,hx left breast ca,2002 [place]  . Allergy   . Tobacco abuse   . HCV (hepatitis C virus)     Past Surgical History  Procedure Laterality Date  . Mastectomy      and Implant  . Mandible fracture surgery    . Breast surgery  2002    left mastectomy with implant  . Tonsillectomy    . Mastectomy w/ sentinel node biopsy Right 10/30/2014  . Simple mastectomy with axillary sentinel node biopsy Right 10/30/2014    Procedure: RIGHT MASTECTOMY WITH SENTINEL NODE MAPPING;  Surgeon: Autumn Messing III, MD;  Location: [place];  Service: General;  Laterality: Right;  . Esophagogastroduodenoscopy (egd) with propofol N/A 03/17/2015    Procedure: ESOPHAGOGASTRODUODENOSCOPY (EGD) WITH PROPOFOL;  Surgeon: Gatha Mayer, MD;  [facility];  Service: Endoscopy;  Laterality: N/A;     There were no vitals filed for this visit.      Subjective Assessment - 09/25/15 0941    Subjective "my blood pressure is up to day"  Pt states she took her bandages off this morning    Pertinent History Bilateral mastectomies (left 2005 and right 10/30/14) with right SLNB.  Adjuvant chemotherapy 2016 and currently doing radiation for right chest.  Bipolar disorder managed with medication. Patient admits to alcohol and crack use.   Patient Stated Goals reduce right arm swelling.   Currently in Pain? No/denies               LYMPHEDEMA/ONCOLOGY QUESTIONNAIRE - 09/25/15 0943    Right Upper Extremity Lymphedema   10 cm Proximal to Olecranon Process 33 cm   Olecranon Process 27.5 cm   15 cm Proximal to Ulnar Styloid Process 28 cm   10 cm Proximal to Ulnar Styloid Process 24 cm   Just Proximal to Ulnar Styloid Process 18.2 cm   Across Hand at [employer] 19 cm   At [place] of 2nd Digit 6 cm                  [facility] Adult PT Treatment/Exercise - 09/25/15 0001    Manual Therapy   Manual Lymphatic Drainage (MLD) brief session of  manual lymph draiange to right arm especially forearm with extra time spent on fibrotic areas    Compression Bandaging right UE bandaged with thick stockinette, Elastomull to all five fingers, large dot Medi foam at forearm, 1/2 inch gray foam piece at antecubital fossa, small dot Medi foam at upper foream, Artiflex x 1.5, and 4 short stretch bandages from hand to shoulder using herring bone pattern with last bandage.                   Short Term Clinic Goals - 09/25/15 0950    CC Short Term Goal  #1   Title Patient will verbalize understanding of lymphedema risk reduction practices.   Status Achieved   CC Short Term Goal  #2   Title Patient will reduce right UE at 10 cm proximal to olecranon process to </= 32 cm.   Baseline at 32.1 on 08/14/15, 33.1 09/07/15 (pt has been out for 2 weeks), 33 cm on 09/25/2015   Status On-going   CC  Short Term Goal  #3   Title Patient will reduce right UE at 10 cm proximal to ulnar styloid process to </= 22 cm.   Baseline 24.2, 07/31/15- 22.1 cm, 23.2 09/07/15 (pt has been out for 2 weeks),24 cm on 09/25/2015   Status On-going   CC Short Term Goal  #5   Title Patient will report >/= 25% improvement in right UE pain and tightness.   Baseline pt reports wrist pain in the the same    Status On-going             [place] - 09/25/15 [id number]    CC Long Term Goal  #1   Title Patient will verbalize understanding of where and how to be fitted for a compression sleeve and glove.   Status Achieved   CC Long Term Goal  #2   Title Patient will reduce right UE at 10 cm proximal to olecranon process to </= 30 cm.   Status On-going   CC Long Term Goal  #3   Title Patient will reduce right UE at 10 cm proximal to ulnar styloid process process to </= 21 cm.   CC Long Term Goal  #5   Title Patient will report >/= 50% improvement in right UE pain and tightness.   Status On-going            Plan - 09/25/15 1149    Clinical Impression Statement Pt continues with fibrosis and darkening of righ forearm, but some skin movment is evident with manual lymph drainage.  No difficulty with bandaging today    Clinical Impairments Affecting Rehab Potential Drug use and compliance concerns (pt canceled first 2 evals), transportation; had cellulitis at Rt chest wall 08/24/15 and has finished antibiotics   PT Next Visit Plan Continue complete decongestive therapy until garments arrive.   Consulted and Agree with Plan of Care Patient      Patient will benefit from skilled therapeutic intervention in order to improve the following deficits and impairments:  Increased edema, Pain, Decreased knowledge of precautions, Decreased range of motion, Impaired UE functional use  Visit Diagnosis: Postmastectomy lymphedema syndrome  Pain In Right Arm     Problem List Patient Active Problem List    Diagnosis Date Noted  . Lymphedema of upper extremity 08/04/2015  . Shoulder pain, right 08/04/2015  . Radiation dermatitis 08/04/2015  . Peripheral neuropathy due to chemotherapy ([place]) 06/15/2015  . AKI (acute kidney injury) ([place])   .  Esophagitis   . Leukocytosis 03/17/2015  . Acute esophagitis 03/17/2015  . HCAP (healthcare-associated pneumonia) 03/17/2015  . Hematemesis with nausea   . Acute blood loss anemia 03/16/2015  . Dysphagia 03/16/2015  . Hypocalcemia 03/16/2015  . GIB (gastrointestinal bleeding) 03/15/2015  . Tobacco abuse 03/15/2015  . ARF (acute renal failure) ([place]) 03/15/2015  . Hypokalemia 03/15/2015  . Depression 03/15/2015  . Hypertension   . GERD (gastroesophageal reflux disease)   . Anxiety   . Acute kidney injury ([place])   . Essential hypertension   . Gastroesophageal reflux disease without esophagitis   . Breast cancer of upper-outer quadrant of right female breast s/p Right MRM 10/30/14 10/30/2014  . Alcohol dependence ([place]) 07/09/2013  . MDD (major depressive disorder) ([place]) 07/09/2013  . Alcohol abuse with alcohol-induced mood disorder ([place]) 07/08/2013  . 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 OBESITY 05/13/2010  . TONGUE DISORDER 01/20/2010  . BACK PAIN, LUMBAR 09/03/2008  . THROMBOCYTOPENIA 06/08/2007  . HCV (hepatitis C virus) 06/07/2007  . BIPOLAR AFFECTIVE DISORDER 06/07/2007  . Substance abuse 06/07/2007  . PTSD 06/07/2007  . CONSTIPATION 06/07/2007  . HEPATIC CIRRHOSIS 06/07/2007  . UNSPECIFIED CONCUSSION 06/07/2007  . NEOPLASM, MALIGNANT, BREAST, HX OF 06/07/2007   Donato Heinz. Owens Shark, PT  09/25/2015, 11:53 AM  [place], [place], [address] Phone: [phone]   Fax:  [phone]  Name: Marissa Brooks MRN: [id number] Date of Birth: 03-24-1956

## 2015-09-28 ENCOUNTER — Ambulatory Visit: Payer: Medicare HMO

## 2015-09-28 DIAGNOSIS — I972 Postmastectomy lymphedema syndrome: Secondary | ICD-10-CM

## 2015-09-28 DIAGNOSIS — M79601 Pain in right arm: Secondary | ICD-10-CM

## 2015-09-28 NOTE — Therapy (Signed)
[place], [place], [address] Phone: [phone]   Fax:  [phone]  Physical Therapy Treatment  Patient Details  Name: Marissa Brooks MRN: [id number] Date of Birth: Sep 24, 1955 Referring Provider: Shona Simpson, PA-C, Dr. Iran Planas  Encounter Date: 09/28/2015      PT End of Session - 09/28/15 1016    Visit Number 18   Number of Visits 24   Date for PT Re-Evaluation 10/12/15   PT Start Time 0935   PT Stop Time 1015   PT Time Calculation (min) 40 min   Activity Tolerance Patient tolerated treatment well   Behavior During Therapy [facility] for tasks assessed/performed      Past Medical History  Diagnosis Date  . Hypertension   . Shortness of breath dyspnea     with walking  . Pneumonia   . Anxiety   . Depression   . Bipolar disorder ([employer])   . GERD (gastroesophageal reflux disease)   . Arthritis   . Cancer [facility]) 2002    left breast cancer tx in [place], right breast cancer 08/2014  . ETOH abuse     as of 10/21/14 none for a week  . Cocaine abuse     last use 10/13/14  . Breast cancer ([place]) 09/10/14    right breast,hx left breast ca,2002 [place]  . Allergy   . Tobacco abuse   . HCV (hepatitis C virus)     Past Surgical History  Procedure Laterality Date  . Mastectomy      and Implant  . Mandible fracture surgery    . Breast surgery  2002    left mastectomy with implant  . Tonsillectomy    . Mastectomy w/ sentinel node biopsy Right 10/30/2014  . Simple mastectomy with axillary sentinel node biopsy Right 10/30/2014    Procedure: RIGHT MASTECTOMY WITH SENTINEL NODE MAPPING;  Surgeon: Autumn Messing III, MD;  Location: [place];  Service: General;  Laterality: Right;  . Esophagogastroduodenoscopy (egd) with propofol N/A 03/17/2015    Procedure: ESOPHAGOGASTRODUODENOSCOPY (EGD) WITH PROPOFOL;  Surgeon: Gatha Mayer, MD;  [facility];  Service: Endoscopy;  Laterality: N/A;     There were no vitals filed for this visit.      Subjective Assessment - 09/28/15 0940    Subjective Pt came in with bandages on. No problems reported witht hem and nothing new to report. "I hope my sleeve comes in this week!"    Pertinent History Bilateral mastectomies (left 2005 and right 10/30/14) with right SLNB.  Adjuvant chemotherapy 2016 and currently doing radiation for right chest.  Bipolar disorder managed with medication. Patient admits to alcohol and crack use.   Patient Stated Goals reduce right arm swelling.   Currently in Pain? No/denies               LYMPHEDEMA/ONCOLOGY QUESTIONNAIRE - 09/28/15 1002    Right Upper Extremity Lymphedema   10 cm Proximal to Olecranon Process 31.4 cm   Olecranon Process 26.8 cm   15 cm Proximal to Ulnar Styloid Process 26.6 cm   10 cm Proximal to Ulnar Styloid Process 23.7 cm   Just Proximal to Ulnar Styloid Process 16.9 cm   Across Hand at [employer] 18.8 cm   At [place] of 2nd Digit 6 cm                  [facility] Adult PT Treatment/Exercise - 09/28/15 0001    Manual Therapy  Manual Lymphatic Drainage (MLD) In Supine: Short neck, superficial and deep abdominals, Lt axilla and Rt inguinal nodes, anterior inter-axillary and Rt axillo-inguinal anastomosis, and Rt UE from dorsal hand to lateral shoulder focusing on fibrosis at forearm.    Compression Bandaging right UE bandaged with thick stockinette, Elastomull to all five fingers, large dot Medi foam at forearm, 1/2 inch gray foam piece at antecubital fossa, small dot Medi foam at upper foream, Artiflex x 1.5, and 4 short stretch bandages from hand to shoulder using herring bone pattern with last bandage.                   Short Term Clinic Goals - 09/28/15 1022    CC Short Term Goal  #2   Title Patient will reduce right UE at 10 cm proximal to olecranon process to </= 32 cm.   Baseline at 32.1 on 08/14/15, 33.1 09/07/15 (pt has been out for 2 weeks), 33 cm on  09/25/2015, 31.4 cm 09/28/15   Status Achieved   CC Short Term Goal  #3   Title Patient will reduce right UE at 10 cm proximal to ulnar styloid process to </= 22 cm.   Baseline 24.2, 07/31/15- 22.1 cm, 23.2 09/07/15 (pt has been out for 2 weeks),24 cm on 09/25/2015, 23.7 cm 09/28/15   Status On-going   CC Short Term Goal  #4   Title Patient will improve her lymphedema life impact scale score to </= 24 for improved function   Status Achieved   CC Short Term Goal  #5   Title Patient will report >/= 25% improvement in right UE pain and tightness.   Baseline pt reports wrist pain in the the same , 09/28/15 pt reported tightness feeling "much improved" in Rt wrist   Status Achieved             Long Term Clinic Goals - 09/28/15 1024    CC Long Term Goal  #2   Title Patient will reduce right UE at 10 cm proximal to olecranon process to </= 30 cm.   Baseline 33.7 cm, 33.1 cm 09/09/15 (after 2 week absence); 32.4 on 09/18/15, 31.4 cm 09/28/15   Status On-going   CC Long Term Goal  #3   Title Patient will reduce right UE at 10 cm proximal to ulnar styloid process process to </= 21 cm.   Baseline 24.2 cm, 23.2 cm 09/09/15 (after 2 week absence), 23.7 cm 09/28/15   Status On-going   CC Long Term Goal  #4   Title Patient will improve her lymphedema life impact scale score to </= 20 for improved function   Status Achieved   CC Long Term Goal  #5   Title Patient will report >/= 50% improvement in right UE pain and tightness.   Status Partially Met            Plan - 09/28/15 1017    Clinical Impression Statement Pts forearm fibrosis felt much improved today and her circumference measurements had reduced well at all locations. She also reported her wrist tightness felt greatly improved today once bdanges were removed. Pts daytime compression sleeve should arrive this week and she will benefit from continued therapy until its arrival to keep her fibrosis and overall swelling reduced.    Rehab Potential Good    Clinical Impairments Affecting Rehab Potential Drug use and compliance concerns (pt canceled first 2 evals), transportation; had cellulitis at Rt chest wall 08/24/15 and has finished antibiotics   PT  Frequency 3x / week   PT Duration 8 weeks   PT Treatment/Interventions Manual lymph drainage;Compression bandaging;Patient/family education;ADLs/Self Care Home Management   PT Next Visit Plan Continue complete decongestive therapy until garments arrive.   Consulted and Agree with Plan of Care Patient      Patient will benefit from skilled therapeutic intervention in order to improve the following deficits and impairments:  Increased edema, Pain, Decreased knowledge of precautions, Decreased range of motion, Impaired UE functional use  Visit Diagnosis: Postmastectomy lymphedema syndrome  Pain In Right Arm     Problem List Patient Active Problem List   Diagnosis Date Noted  . Lymphedema of upper extremity 08/04/2015  . Shoulder pain, right 08/04/2015  . Radiation dermatitis 08/04/2015  . 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 AKI (acute kidney injury) ([place])   . Esophagitis   . Leukocytosis 03/17/2015  . Acute esophagitis 03/17/2015  . HCAP (healthcare-associated pneumonia) 03/17/2015  . Hematemesis with nausea   . Acute blood loss anemia 03/16/2015  . Dysphagia 03/16/2015  . Hypocalcemia 03/16/2015  . GIB (gastrointestinal bleeding) 03/15/2015  . Tobacco abuse 03/15/2015  . ARF (acute renal failure) ([place]) 03/15/2015  . Hypokalemia 03/15/2015  . Depression 03/15/2015  . Hypertension   . GERD (gastroesophageal reflux disease)   . Anxiety   . Acute kidney injury ([place])   . Essential hypertension   . Gastroesophageal reflux disease without esophagitis   . Breast cancer of upper-outer quadrant of right female breast s/p Right MRM 10/30/14 10/30/2014  . Alcohol dependence ([place]) 07/09/2013  . MDD (major depressive disorder) ([place]) 07/09/2013  . Alcohol abuse  with alcohol-induced mood disorder ([place]) 07/08/2013  . 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 OBESITY 05/13/2010  . TONGUE DISORDER 01/20/2010  . BACK PAIN, LUMBAR 09/03/2008  . THROMBOCYTOPENIA 06/08/2007  . HCV (hepatitis C virus) 06/07/2007  . BIPOLAR AFFECTIVE DISORDER 06/07/2007  . Substance abuse 06/07/2007  . PTSD 06/07/2007  . CONSTIPATION 06/07/2007  . HEPATIC CIRRHOSIS 06/07/2007  . UNSPECIFIED CONCUSSION 06/07/2007  . NEOPLASM, MALIGNANT, BREAST, HX OF 06/07/2007    Otelia Limes, PTA 09/28/2015, 10:26 AM  [place], [place], [address] Phone: [phone]   Fax:  [phone]  Name: Marissa Brooks MRN: [id number] Date of Birth: 02-22-1956

## 2015-09-29 ENCOUNTER — Telehealth: Payer: Self-pay | Admitting: Hematology

## 2015-09-29 ENCOUNTER — Telehealth: Payer: Self-pay | Admitting: *Deleted

## 2015-09-29 NOTE — Telephone Encounter (Signed)
CALLED PATIENT TO INFORM OF FU APPT. WITH DR. FENG ON 10-20-15- ARRIVAL TIME - 8:30 AM, NO ANSWER MAILED APPT. CARD

## 2015-09-29 NOTE — Telephone Encounter (Signed)
Rad onc request a f/u with Burr Medico

## 2015-09-29 NOTE — Telephone Encounter (Signed)
XXXX 

## 2015-09-30 ENCOUNTER — Ambulatory Visit: Payer: Medicare HMO | Admitting: Physical Therapy

## 2015-09-30 DIAGNOSIS — I972 Postmastectomy lymphedema syndrome: Secondary | ICD-10-CM | POA: Diagnosis not present

## 2015-09-30 DIAGNOSIS — M79601 Pain in right arm: Secondary | ICD-10-CM

## 2015-09-30 NOTE — Therapy (Signed)
[place], [place], [address] Phone: [phone]   Fax:  [phone]  Physical Therapy Treatment  Patient Details  Name: Marissa Brooks MRN: [id number] Date of Birth: March 31, 1956 Referring Provider: Shona Simpson, PA-C, Dr. Iran Planas  Encounter Date: 09/30/2015      PT End of Session - 09/30/15 1016    Visit Number 19   Number of Visits 24   Date for PT Re-Evaluation 10/12/15   PT Start Time 0935   PT Stop Time 1017   PT Time Calculation (min) 42 min   Activity Tolerance Patient tolerated treatment well   Behavior During Therapy [facility] for tasks assessed/performed      Past Medical History  Diagnosis Date  . Hypertension   . Shortness of breath dyspnea     with walking  . Pneumonia   . Anxiety   . Depression   . Bipolar disorder ([place])   . GERD (gastroesophageal reflux disease)   . Arthritis   . Cancer [facility]) 2002    left breast cancer tx in [place], right breast cancer 08/2014  . ETOH abuse     as of 10/21/14 none for a week  . Cocaine abuse     last use 10/13/14  . Breast cancer ([place]) 09/10/14    right breast,hx left breast ca,2002 [place]  . Allergy   . Tobacco abuse   . HCV (hepatitis C virus)     Past Surgical History  Procedure Laterality Date  . Mastectomy      and Implant  . Mandible fracture surgery    . Breast surgery  2002    left mastectomy with implant  . Tonsillectomy    . Mastectomy w/ sentinel node biopsy Right 10/30/2014  . Simple mastectomy with axillary sentinel node biopsy Right 10/30/2014    Procedure: RIGHT MASTECTOMY WITH SENTINEL NODE MAPPING;  Surgeon: Autumn Messing III, MD;  Location: [place];  Service: General;  Laterality: Right;  . Esophagogastroduodenoscopy (egd) with propofol N/A 03/17/2015    Procedure: ESOPHAGOGASTRODUODENOSCOPY (EGD) WITH PROPOFOL;  Surgeon: Gatha Mayer, MD;  [facility];  Service: Endoscopy;  Laterality: N/A;     There were no vitals filed for this visit.      Subjective Assessment - 09/30/15 0942    Subjective They are going to deliver my garment here at 9:30 on Monday    Pertinent History Bilateral mastectomies (left 2005 and right 10/30/14) with right SLNB.  Adjuvant chemotherapy 2016 and currently doing radiation for right chest.  Bipolar disorder managed with medication. Patient admits to alcohol and crack use.   Patient Stated Goals reduce right arm swelling.   Currently in Pain? No/denies                         [facility] Adult PT Treatment/Exercise - 09/30/15 0001    Manual Therapy   Manual Lymphatic Drainage (MLD) In Supine: Short neck, superficial and deep abdominals, Lt axilla and Rt inguinal nodes, anterior inter-axillary and Rt axillo-inguinal anastomosis, and Rt UE from dorsal hand to lateral shoulder focusing on fibrosis at forearm.    Compression Bandaging right UE bandaged with thick stockinette, Elastomull to all five fingers, large dot Medi foam at forearm, 1/2 inch gray foam piece at antecubital fossa, small dot Medi foam at upper foream, Artiflex x 1.5, and 4 short stretch bandages from hand to shoulder using herring bone pattern with last bandage.  Short Term Clinic Goals - 09/28/15 1022    CC Short Term Goal  #2   Title Patient will reduce right UE at 10 cm proximal to olecranon process to </= 32 cm.   Baseline at 32.1 on 08/14/15, 33.1 09/07/15 (pt has been out for 2 weeks), 33 cm on 09/25/2015, 31.4 cm 09/28/15   Status Achieved   CC Short Term Goal  #3   Title Patient will reduce right UE at 10 cm proximal to ulnar styloid process to </= 22 cm.   Baseline 24.2, 07/31/15- 22.1 cm, 23.2 09/07/15 (pt has been out for 2 weeks),24 cm on 09/25/2015, 23.7 cm 09/28/15   Status On-going   CC Short Term Goal  #4   Title Patient will improve her lymphedema life impact scale score to </= 24 for improved function   Status Achieved   CC Short Term  Goal  #5   Title Patient will report >/= 25% improvement in right UE pain and tightness.   Baseline pt reports wrist pain in the the same , 09/28/15 pt reported tightness feeling "much improved" in Rt wrist   Status Achieved             Long Term Clinic Goals - 09/28/15 1024    CC Long Term Goal  #2   Title Patient will reduce right UE at 10 cm proximal to olecranon process to </= 30 cm.   Baseline 33.7 cm, 33.1 cm 09/09/15 (after 2 week absence); 32.4 on 09/18/15, 31.4 cm 09/28/15   Status On-going   CC Long Term Goal  #3   Title Patient will reduce right UE at 10 cm proximal to ulnar styloid process process to </= 21 cm.   Baseline 24.2 cm, 23.2 cm 09/09/15 (after 2 week absence), 23.7 cm 09/28/15   Status On-going   CC Long Term Goal  #4   Title Patient will improve her lymphedema life impact scale score to </= 20 for improved function   Status Achieved   CC Long Term Goal  #5   Title Patient will report >/= 50% improvement in right UE pain and tightness.   Status Partially Met            Plan - 09/30/15 1017    Clinical Impression Statement Fibrosis in arm continues to imprve, but still is present.  She hopes to received her garment next Monday and then will be ready to discharge from PT    PT Next Visit Plan Continue complete decongestive therapy until garments arrive. Gcode next visit       Patient will benefit from skilled therapeutic intervention in order to improve the following deficits and impairments:  Increased edema, Pain, Decreased knowledge of precautions, Decreased range of motion, Impaired UE functional use  Visit Diagnosis: Pain In Right Arm  Postmastectomy lymphedema     Problem List Patient Active Problem List   Diagnosis Date Noted  . Lymphedema of upper extremity 08/04/2015  . Shoulder pain, right 08/04/2015  . Radiation dermatitis 08/04/2015  . Peripheral neuropathy due to chemotherapy ([place]) 06/15/2015  . AKI (acute kidney injury) ([place])   .  Esophagitis   . Leukocytosis 03/17/2015  . Acute esophagitis 03/17/2015  . HCAP (healthcare-associated pneumonia) 03/17/2015  . Hematemesis with nausea   . Acute blood loss anemia 03/16/2015  . Dysphagia 03/16/2015  . Hypocalcemia 03/16/2015  . GIB (gastrointestinal bleeding) 03/15/2015  . Tobacco abuse 03/15/2015  . ARF (acute renal failure) ([place]) 03/15/2015  . Hypokalemia 03/15/2015  .  Depression 03/15/2015  . Hypertension   . GERD (gastroesophageal reflux disease)   . Anxiety   . Acute kidney injury ([place])   . Essential hypertension   . Gastroesophageal reflux disease without esophagitis   . Breast cancer of upper-outer quadrant of right female breast s/p Right MRM 10/30/14 10/30/2014  . Alcohol dependence ([place]) 07/09/2013  . MDD (major depressive disorder) ([place]) 07/09/2013  . Alcohol abuse with alcohol-induced mood disorder ([place]) 07/08/2013  . 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 OBESITY 05/13/2010  . TONGUE DISORDER 01/20/2010  . BACK PAIN, LUMBAR 09/03/2008  . THROMBOCYTOPENIA 06/08/2007  . HCV (hepatitis C virus) 06/07/2007  . BIPOLAR AFFECTIVE DISORDER 06/07/2007  . Substance abuse 06/07/2007  . PTSD 06/07/2007  . CONSTIPATION 06/07/2007  . HEPATIC CIRRHOSIS 06/07/2007  . UNSPECIFIED CONCUSSION 06/07/2007  . NEOPLASM, MALIGNANT, BREAST, HX OF 06/07/2007   Donato Heinz. Owens Shark, PT  09/30/2015, 10:20 AM  [place], [place], [address] Phone: [phone]   Fax:  [phone]  Name: Marissa Brooks MRN: [id number] Date of Birth: [birth year]

## 2015-10-05 ENCOUNTER — Ambulatory Visit: Payer: Medicare HMO

## 2015-10-05 DIAGNOSIS — I972 Postmastectomy lymphedema syndrome: Secondary | ICD-10-CM | POA: Diagnosis not present

## 2015-10-05 DIAGNOSIS — M79601 Pain in right arm: Secondary | ICD-10-CM

## 2015-10-05 NOTE — Therapy (Signed)
[place], [place], [address] Phone: [phone]   Fax:  [phone]  Physical Therapy Treatment  Patient Details  Name: Marissa Brooks MRN: [id number] Date of Birth: 1956/03/29 Referring Provider: Shona Simpson, PA-C, Dr. Iran Planas  Encounter Date: 10/05/2015      PT End of Session - 10/05/15 1008    Visit Number 20   Number of Visits 24   Date for PT Re-Evaluation 10/12/15   PT Start Time 1000  Pt arrived on time but fitter for her garments arrived late so started late.    PT Stop Time 1015   PT Time Calculation (min) 15 min   Activity Tolerance Patient tolerated treatment well   Behavior During Therapy WFL for tasks assessed/performed      Past Medical History  Diagnosis Date  . Hypertension   . Shortness of breath dyspnea     with walking  . Pneumonia   . Anxiety   . Depression   . Bipolar disorder ([place])   . GERD (gastroesophageal reflux disease)   . Arthritis   . Cancer [facility]) 2002    left breast cancer tx in [place], right breast cancer 08/2014  . ETOH abuse     as of 10/21/14 none for a week  . Cocaine abuse     last use 10/13/14  . Breast cancer ([place]) 09/10/14    right breast,hx left breast ca,2002 [place]  . Allergy   . Tobacco abuse   . HCV (hepatitis C virus)     Past Surgical History  Procedure Laterality Date  . Mastectomy      and Implant  . Mandible fracture surgery    . Breast surgery  2002    left mastectomy with implant  . Tonsillectomy    . Mastectomy w/ sentinel node biopsy Right 10/30/2014  . Simple mastectomy with axillary sentinel node biopsy Right 10/30/2014    Procedure: RIGHT MASTECTOMY WITH SENTINEL NODE MAPPING;  Surgeon: Autumn Messing III, MD;  Location: [facility];  Service: General;  Laterality: Right;  . Esophagogastroduodenoscopy (egd) with propofol N/A 03/17/2015    Procedure: ESOPHAGOGASTRODUODENOSCOPY (EGD) WITH PROPOFOL;  Surgeon: Gatha Mayer, MD;  Location: [facility];  Service: Endoscopy;  Laterality: N/A;    There were no vitals filed for this visit.      Subjective Assessment - 10/05/15 0941    Subjective I'm excited to get my garment today! My wrist feels tight again today when I move it.    Pertinent History Bilateral mastectomies (left 2005 and right 10/30/14) with right SLNB.  Adjuvant chemotherapy 2016 and currently doing radiation for right chest.  Bipolar disorder managed with medication. Patient admits to alcohol and crack use.   Patient Stated Goals reduce right arm swelling.   Currently in Pain? No/denies            [facility] PT Assessment - 10/05/15 0001    Observation/Other Assessments   Observations Lymphedema Life Impact Scale score 10 with 15% functional limitation and Medicare G-code score of CI indicating 1-20% impairment           LYMPHEDEMA/ONCOLOGY QUESTIONNAIRE - 10/05/15 0959    Right Upper Extremity Lymphedema   10 cm Proximal to Olecranon Process 31.4 cm   Olecranon Process 26.6 cm   15 cm Proximal to Ulnar Styloid Process 26.4 cm   10 cm Proximal to Ulnar Styloid Process 23.1 cm   Just Proximal to Ulnar Styloid Process 17.9 cm  Across Hand at [employer] 18.9 cm   At [place] of 2nd Digit 6.2 cm          Short Term Clinic Goals - 10/05/15 1016    CC Short Term Goal  #1   Title Patient will verbalize understanding of lymphedema risk reduction practices.   Status Achieved   CC Short Term Goal  #2   Title Patient will reduce right UE at 10 cm proximal to olecranon process to </= 32 cm.   Baseline at 32.1 on 08/14/15, 33.1 09/07/15 (pt has been out for 2 weeks), 33 cm on 09/25/2015, 31.4 cm 09/28/15 and 10/05/15   Status Achieved   CC Short Term Goal  #3   Title Patient will reduce right UE at 10 cm proximal to ulnar styloid process to </= 22 cm.   CC Short Term Goal  #4   Title Patient will improve her lymphedema life impact scale score to </= 24 for improved function   Baseline  29, 10 10/05/15   Status Achieved   CC Short Term Goal  #5   Title Patient will report >/= 25% improvement in right UE pain and tightness.   Status Achieved             Long Term Clinic Goals - 10/05/15 1212    CC Long Term Goal  #1   Title Patient will verbalize understanding of where and how to be fitted for a compression sleeve and glove.  These were issued to pt today by fitter.   Status Achieved   CC Long Term Goal  #2   Title Patient will reduce right UE at 10 cm proximal to olecranon process to </= 30 cm.   Baseline 33.7 cm, 33.1 cm 09/09/15 (after 2 week absence); 32.4 on 09/18/15, 31.4 cm 09/28/15 and 10/05/15   Status Partially Met   CC Long Term Goal  #3   Title Patient will reduce right UE at 10 cm proximal to ulnar styloid process process to </= 21 cm.   Baseline 24.2 cm, 23.2 cm 09/09/15 (after 2 week absence), 23.7 cm 09/28/15, 23.1 cm 10/05/15   Status Partially Met   CC Long Term Goal  #4   Title Patient will improve her lymphedema life impact scale score to </= 20 for improved function   Baseline 29 at eval; 18 on 09/18/15, 10 10/05/15   Status Achieved   CC Long Term Goal  #5   Title Patient will report >/= 50% improvement in right UE pain and tightness.  This comes and goes and her swelling increases or reduces   Status Partially Met            Plan - 10/05/15 0949    Clinical Impression Statement Marissa Brooks from  Ability delivered pts day and nighttime compression garments to clinic today though got confused on time and arrived at pts treatment time instead of before. Pts circumference measurements continue to be reduced from last week except her wrist was increased by 1 cm, which is still less than her initial measurements as her wrist was greatly reduced last week.   Rehab Potential Good   Clinical Impairments Affecting Rehab Potential Drug use and compliance concerns (pt canceled first 2 evals), transportation; had cellulitis at Rt chest wall 08/24/15 and has  finished antibiotics   PT Frequency 3x / week   PT Duration 8 weeks   PT Treatment/Interventions Manual lymph drainage;Compression bandaging;Patient/family education;ADLs/Self Care Home Management   PT Next Visit  Plan D/C this visit.   Consulted and Agree with Plan of Care Patient      Patient will benefit from skilled therapeutic intervention in order to improve the following deficits and impairments:  Increased edema, Pain, Decreased knowledge of precautions, Decreased range of motion, Impaired UE functional use  Visit Diagnosis: Postmastectomy lymphedema  Pain In Right Arm       G-Codes - 31-Oct-2015 1329    Functional Assessment Tool Used clinical judgement   Functional Limitation Other PT primary   Other PT Primary Goal Status (U5427) At least 1 percent but less than 20 percent impaired, limited or restricted   Other PT Primary Discharge Status (C6237) At least 1 percent but less than 20 percent impaired, limited or restricted      Problem List Patient Active Problem List   Diagnosis Date Noted  . Lymphedema of upper extremity 08/04/2015  . Shoulder pain, right 08/04/2015  . Radiation dermatitis 08/04/2015  . 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 AKI (acute kidney injury) ([place])   . Esophagitis   . Leukocytosis 03/17/2015  . Acute esophagitis 03/17/2015  . HCAP (healthcare-associated pneumonia) 03/17/2015  . Hematemesis with nausea   . Acute blood loss anemia 03/16/2015  . Dysphagia 03/16/2015  . Hypocalcemia 03/16/2015  . GIB (gastrointestinal bleeding) 03/15/2015  . Tobacco abuse 03/15/2015  . ARF (acute renal failure) ([place]) 03/15/2015  . Hypokalemia 03/15/2015  . Depression 03/15/2015  . Hypertension   . GERD (gastroesophageal reflux disease)   . Anxiety   . Acute kidney injury ([place])   . Essential hypertension   . Gastroesophageal reflux disease without esophagitis   . Breast cancer of upper-outer quadrant of right female breast s/p  Right MRM 10/30/14 10/30/2014  . Alcohol dependence ([place]) 07/09/2013  . MDD (major depressive disorder) ([place]) 07/09/2013  . Alcohol abuse with alcohol-induced mood disorder ([place]) 07/08/2013  . 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 OBESITY 05/13/2010  . TONGUE DISORDER 01/20/2010  . BACK PAIN, LUMBAR 09/03/2008  . THROMBOCYTOPENIA 06/08/2007  . HCV (hepatitis C virus) 06/07/2007  . BIPOLAR AFFECTIVE DISORDER 06/07/2007  . Substance abuse 06/07/2007  . PTSD 06/07/2007  . CONSTIPATION 06/07/2007  . HEPATIC CIRRHOSIS 06/07/2007  . UNSPECIFIED CONCUSSION 06/07/2007  . NEOPLASM, MALIGNANT, BREAST, HX OF 06/07/2007    SMITH,MARTI COOPER, PTA Oct 31, 2015, 1:31 PM  [place], [place], [address] Phone: [phone]   Fax:  [phone]  Name: Marissa Brooks MRN: [id number] Date of Birth: 06-05-1955   PHYSICAL THERAPY DISCHARGE SUMMARY  Visits from Start of Care: 20  Current functional level related to goals / functional outcomes: All goals met except long term goals related to arm circumferential measurements and pain.  Her pain has improved by not by >/= 50%.  She received daytime compression garments and a nighttime garment today which will be essential in her self-management of her lymphedema.   Remaining deficits: Continued mild tightness in her arm and pain but is manageable with compression garments.   Education / Equipment: Daytime compression sleeve and glove and a nighttime compression garment.  Plan: Patient agrees to discharge.  Patient goals were partially met. Patient is being discharged due to                                                     ?  being independent in her self management of her lymphedema.?         Annia Friendly, PT 10/05/2015 1:37 PM

## 2015-10-07 ENCOUNTER — Ambulatory Visit: Payer: Medicare HMO

## 2015-10-13 ENCOUNTER — Encounter: Payer: Self-pay | Admitting: Nurse Practitioner

## 2015-10-15 ENCOUNTER — Telehealth: Payer: Self-pay | Admitting: *Deleted

## 2015-10-15 NOTE — Telephone Encounter (Signed)
LM for pt to rtn call to reschedule survivorship appt on emergency contact number. Pt home/mobile number not in service

## 2015-10-20 ENCOUNTER — Encounter: Payer: Self-pay | Admitting: Hematology

## 2015-10-20 NOTE — Progress Notes (Signed)
No show  This encounter was created in error - please disregard.

## 2015-10-21 ENCOUNTER — Encounter: Payer: Self-pay | Admitting: Nurse Practitioner

## 2015-10-21 ENCOUNTER — Telehealth: Payer: Self-pay | Admitting: Hematology

## 2015-10-21 DIAGNOSIS — C50411 Malignant neoplasm of upper-outer quadrant of right female breast: Secondary | ICD-10-CM

## 2015-10-21 NOTE — Progress Notes (Signed)
The Survivorship Care Plan was mailed to Marissa Brooks as she reported not being able to come in to the Survivorship Clinic for an in-person visit at this time. A letter was mailed to her outlining the purpose of the content of the care plan, as well as encouraging her to reach out to me with any questions or concerns.  My business card was included in the correspondence to the patient as well.  A copy of the care plan was also routed/faxed/mailed to [facility], [place], the patient's PCP.  I will not be placing any follow-up appointments to the Survivorship Clinic for Marissa Brooks, but I am happy to see her at any time in the future for any survivorship concerns that may arise. Thank you for allowing me to participate in her care!  Kenn File, [place] [phone]

## 2015-10-21 NOTE — Telephone Encounter (Signed)
pt called to r/s missed appt....pt ok and aware of new d.t °

## 2015-11-16 ENCOUNTER — Telehealth: Payer: Self-pay | Admitting: *Deleted

## 2015-11-16 ENCOUNTER — Encounter: Payer: Self-pay | Admitting: Hematology

## 2015-11-16 NOTE — Telephone Encounter (Signed)
Called pt's ph # & unable to leave a message.  Called Reggie & spoke with him.  He states that pt has moved back to [place] But he would have his wife f/u with her regarding missed appt.

## 2015-11-25 NOTE — Progress Notes (Signed)
This encounter was created in error - please disregard.

## 2016-04-19 ENCOUNTER — Telehealth: Payer: Self-pay | Admitting: *Deleted

## 2016-04-19 NOTE — Telephone Encounter (Signed)
"  I need to make an appointment with Dr. Burr Medico.  There is a medicine I'm to start.  I finished my radiation.  Return number [phone]." Needs to reschedule the past May and June 2017 appointments missed.

## 2016-04-20 NOTE — Telephone Encounter (Signed)
I will send a message to scheduler.   Annais Crafts MD  

## 2016-05-04 ENCOUNTER — Telehealth: Payer: Self-pay | Admitting: Hematology

## 2016-05-04 NOTE — Telephone Encounter (Signed)
Appointment rescheduled per patient request. Patient made a visit to scheduling to have appointment rescheduled to a Tuesday. Patient had to leave, wished to be contacted with a Tuesday appointment. Per the patient she is only available on Tuesdays. Called patient to verify appointment time and day was okay. Could not reach patient by phone, schedule mailed out to patient.

## 2016-05-09 ENCOUNTER — Other Ambulatory Visit: Payer: Self-pay

## 2016-05-09 ENCOUNTER — Ambulatory Visit: Payer: Self-pay | Admitting: Hematology

## 2016-05-24 ENCOUNTER — Telehealth: Payer: Self-pay | Admitting: Hematology

## 2016-05-24 ENCOUNTER — Ambulatory Visit: Payer: Self-pay | Admitting: Hematology

## 2016-05-24 ENCOUNTER — Other Ambulatory Visit: Payer: Self-pay

## 2016-05-24 NOTE — Progress Notes (Deleted)
Marissa Brooks  [phone] Fax:(336) [phone]  Clinic Follow Up Note   Patient Care Team: Elwyn Reach, MD as PCP - General (Internal Medicine) Autumn Messing III, MD as Consulting Physician (General Surgery) Truitt Merle, MD as Consulting Physician (Hematology) Kyung Rudd, MD as Consulting Physician (Radiation Oncology) Sylvan Cheese, NP as Nurse Practitioner (Hematology and Oncology) 05/24/2016  CHIEF COMPLAINTS:  Follow-up of breast cancer  Oncology History   Breast cancer of upper-outer quadrant of right female breast s/p Right MRM 10/30/14   Staging form: Breast, AJCC 7th Edition     Pathologic: Stage IIA (T1b, N1a, cM0) - Unsigned       Breast cancer of upper-outer quadrant of right female breast s/p Right MRM 10/30/14   06/03/2004 Cancer Diagnosis    Left breast DCIS, status post mastectomy      06/06/2014 Mammogram    Mammogram and ultrasound showed an irregular 0.7 cm mass at the 12:30 clock position of right breast. Axillary nodes were negative.      08/12/2014 Initial Biopsy    Right breast 12:30 o'clock biopsy showed invasive ductal carcinoma, grade 2. ER+ (100%), PR+ (88%), HER2/neu negative, Ki67 19%      08/12/2014 Clinical Stage    Stage IA: T1b N0      10/30/2014 Surgery    Right breast mastectomy and axillary node dissection, surgical margins were negative.       10/30/2014 Pathology Results    Right breast radical mastectomy showed invasive ductal carcinoma, grade 1, tumor measuring 1.0 cm, margins were negative lymphovascular invasion (+), 1 sentinel lymph nodes positive, 15 additional axillary nodes negative.  (+) DCIS       10/30/2014 Pathologic Stage    Stage IIA: pT1b pN1a      12/30/2014 - 03/03/2015 Chemotherapy    Docetaxel 75 mg/m, Cytoxan 600 mg/m, every 3 weeks, s/p 4 cycles       03/15/2015 - 03/22/2015 Hospital Admission    Patient was admitted for GI bleeding, required blood transfusion, EDC showed  esophagitis.      06/29/2015 - 08/07/2015 Radiation Therapy    Right chest wall 48 Gy in 24 fractions, right chest wall 1.8 Gy in 1 fractions, Right chest wall scar boost treated to 10 Gy in 5 fractions.       Anti-estrogen oral therapy    To discuss with Dr. Burr Medico 10/20/2015      10/21/2015 Survivorship    Survivorship care plan mailed to patient        HISTORY OF PRESENTING ILLNESS:  Marissa Brooks 61 y.o. female is here because of recently diagnosed right breast cancer.   This was discovered by screening mammogram in January 2016. She did not have palpable breast mass or any other symptoms. Biopsy on 08/12/2014 showed invasive ductal carcinoma, ER/PR positive, HER-2 negative. She underwent right breast mastectomy by Dr. Marcello Moores on 10/28/2014. Marland Kitchen   She has had some pain underneath her R armpit with some swelling. She reports that her "drain fell out" and was seen by Dr. Marlou Starks on 7/15 at which time a seroma was drained. Otherwise, she denies any problems with appetite, energy, pain. She does report having lost weight 195 lbs to 186 lbs though over an unspecified time interval. She is now menopausal with last menstrual period being sometime in her late 20s.  Per records obtained from [facility], she underwent left simple mastectomy on at [facility] on 07/16/04 after detection of multiple abnormalities in the left breast  on mammography and was diagnosed with DCIS. On 05/27/05, she underwent placement of left tissue expander but declined additional reconstructive surgery. On 06/15/06, she was seen by Dr. Sophronia Simas for possible adjuvant tamoxifen and was recommended to undergo close surveillance. 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   She follows with Dr. Gala Romney for PCP care and was last seen by them prior to the surgery. She is currently undergoing substance abuse rehab. She reports that 1 pack of cigarettes lasts her a week and has  not used cocaine for a month ago. She does report using heroin in the past. She also has history of bipolar for which she is on Abilify. She is widowed, has no children. She lives with a roommate who she has known for the last 10 years.  CURRENT THERAPY: Adjuvant radiation   INTERIM HISTORY Marissa Brooks returns for follow up. She has started adjuvant breast radiation on 06/29/2015. She has been tolerating well so far, has mild fatigue, no other significant toxicity. She complains her right arm lymphedema has been getting worse, and cause moderate pain. She has started physical therapy 2 days ago. She also complains of pain at her toes and she wishes, she lost both of big toenails from chemotherapy. She otherwise is doing well overall, denies any other new symptoms.   MEDICAL HISTORY:  Past Medical History:  Diagnosis Date  . Allergy   . Anxiety   . Arthritis   . Bipolar disorder ([place])   . Breast cancer ([place]) 09/10/14   right breast,hx left breast ca,2002 [place]  . Cancer [facility]) 2002   left breast cancer tx in [place], right breast cancer 08/2014  . Cocaine abuse    last use 10/13/14  . Depression   . ETOH abuse    as of 10/21/14 none for a week  . GERD (gastroesophageal reflux disease)   . HCV (hepatitis C virus)   . Hypertension   . Pneumonia   . Shortness of breath dyspnea    with walking  . Tobacco abuse     SURGICAL HISTORY: Past Surgical History:  Procedure Laterality Date  . BREAST SURGERY  2002   left mastectomy with implant  . ESOPHAGOGASTRODUODENOSCOPY (EGD) WITH PROPOFOL N/A 03/17/2015   Procedure: ESOPHAGOGASTRODUODENOSCOPY (EGD) WITH PROPOFOL;  Surgeon: Gatha Mayer, MD;  Location: [facility];  Service: Endoscopy;  Laterality: N/A;  . MANDIBLE FRACTURE SURGERY    . MASTECTOMY     and Implant  . MASTECTOMY W/ SENTINEL NODE BIOPSY Right 10/30/2014  . SIMPLE MASTECTOMY WITH AXILLARY SENTINEL NODE BIOPSY Right 10/30/2014   Procedure: RIGHT MASTECTOMY WITH SENTINEL  NODE MAPPING;  Surgeon: Autumn Messing III, MD;  Location: [facility];  Service: General;  Laterality: Right;  . TONSILLECTOMY      SOCIAL HISTORY: Social History   Social History  . Marital status: Widowed    Spouse name: N/A  . Number of children: N/A  . Years of education: N/A   Occupational History  . Not on file.   Social History Main Topics  . Smoking status: Current Every Day Smoker    Packs/day: 0.25    Types: Cigarettes  . Smokeless tobacco: Never Used  . Alcohol use Yes     Comment: 3-4 40 oz/day   . Drug use:     Types: Cocaine, Marijuana     Comment: last use of crack this morning   . Sexual activity: Not Currently  Birth control/ protection: Abstinence   Other Topics Concern  . Not on file   Social History Narrative  . No narrative on file    FAMILY HISTORY: Family History  Problem Relation Age of Onset  . Breast cancer Sister 40  . Mental illness Mother   . HIV/AIDS Sister     Deceased    ALLERGIES:  is allergic to centrum.  MEDICATIONS:  Current Outpatient Prescriptions  Medication Sig Dispense Refill  . ARIPiprazole (ABILIFY) 5 MG tablet Take 1 tablet (5 mg total) by mouth daily. For mood control 30 tablet 0  . gabapentin (NEURONTIN) 100 MG capsule Take 2 capsules (200 mg total) by mouth 3 (three) times daily. 180 capsule 2  . hyaluronate sodium (RADIAPLEXRX) GEL Apply 1 application topically 2 (two) times daily.    Marland Kitchen HYDROcodone-acetaminophen (NORCO) 5-325 MG tablet Take 1-2 tablets by mouth every 6 (six) hours as needed for moderate pain. 30 tablet 0  . lisinopril (PRINIVIL,ZESTRIL) 10 MG tablet Take 10 mg by mouth daily.    . non-metallic deodorant Jethro Poling) MISC Apply 1 application topically daily as needed. Reported on 07/10/2015    . sertraline (ZOLOFT) 100 MG tablet Take 1 tablet (100 mg total) by mouth at bedtime. For depression 30 tablet 0  . traZODone (DESYREL) 100 MG tablet Take 1 tablet (100 mg total) by mouth at bedtime. For sleep 30 tablet 0     No current facility-administered medications for this visit.     REVIEW OF SYSTEMS:   Constitutional: Denies fevers, chills or abnormal night sweats Eyes: Denies blurriness of vision, double vision or watery eyes Ears, nose, mouth, throat, and face: Denies mucositis or sore throat Respiratory: Denies cough, dyspnea or wheezes Cardiovascular: Denies palpitation, chest discomfort or lower extremity swelling Gastrointestinal:  Denies nausea, heartburn or change in bowel habits Skin: Denies abnormal skin rashes Lymphatics: Denies new lymphadenopathy or easy bruising Neurological:Denies numbness, tingling or new weaknesses Behavioral/Psych: Mood is stable, no new changes  All other systems were reviewed with the patient and are negative.  PHYSICAL EXAMINATION: ECOG PERFORMANCE STATUS: 0 - Asymptomatic  There were no vitals filed for this visit. There were no vitals filed for this visit.  GENERAL:alert, no distress and comfortable SKIN: skin color, texture, turgor are normal, no rashes or significant lesions, except multiple large healing skim rash/ulcers and pigmentation/color change on b/l front arms, L>R. She has a healing skin lesion on her scalp  EYES: normal, conjunctiva are pink and non-injected, sclera clear OROPHARYNX:no exudate, no erythema and lips, buccal mucosa, and tongue normal  NECK: supple, thyroid normal size, non-tender, without nodularity LYMPH:  no palpable lymphadenopathy in the cervical, axillary or inguinal BREAST: s/p bilateral mastectomy, s/p left implant placement. exam of the left breast, right chest wall and bilateral axilla revealed no palpable mass or adenopathy. LUNGS: clear to auscultation and percussion with normal breathing effort HEART: regular rate & rhythm and no murmurs and no lower extremity edema ABDOMEN:abdomen soft, non-tender and normal bowel sounds Musculoskeletal:no cyanosis of digits and no clubbing  PSYCH: alert & oriented x 3 NEURO: no  focal motor/sensory deficits Ext: Moderate right arm lymphedema involving the forearm and hand, right shoulder mobility is normal.  LABORATORY DATA:  I have reviewed the data as listed CBC Latest Ref Rng & Units 08/26/2015 07/16/2015 06/15/2015  WBC 4.0 - 10.5 K/uL 5.0 3.4(L) 5.5  Hemoglobin 12.0 - 15.0 g/dL 11.7(L) 12.2 12.2  Hematocrit 36.0 - 46.0 % 34.4(L) 36.1 36.2  Platelets 150 -  400 K/uL 180 115(L) 156    CMP Latest Ref Rng & Units 08/26/2015 07/16/2015 06/15/2015  Glucose 65 - 99 mg/dL 101(H) 74 101  BUN 6 - 20 mg/dL 18 13.4 19.9  Creatinine 0.44 - 1.00 mg/dL 0.94 0.9 0.9  Sodium 135 - 145 mmol/L 144 141 138  Potassium 3.5 - 5.1 mmol/L 3.8 4.1 4.7  Chloride 101 - 111 mmol/L 114(H) - -  CO2 22 - 32 mmol/L 20(L) 26 20(L)  Calcium 8.9 - 10.3 mg/dL 9.4 9.4 9.5  Total Protein 6.5 - 8.1 g/dL 7.7 7.3 7.7  Total Bilirubin 0.3 - 1.2 mg/dL 0.4 0.33 <0.30  Alkaline Phos 38 - 126 U/L 82 96 96  AST 15 - 41 U/L 72(H) 57(H) 33  ALT 14 - 54 U/L 62(H) 56(H) 38    PATHOLOGY REPORT 10/30/2014 ADDITIONAL INFORMATION: 2. FLUORESCENCE IN-SITU HYBRIDIZATION  Results: HER2 - NEGATIVE RATIO OF HER2/CEP17 SIGNALS 1.62 AVERAGE HER2 COPY NUMBER PER CELL 2.10 Reference Range: NEGATIVE HER2/CEP17 Ratio <2.0 and average HER2 copy number <4.0 EQUIVOCAL HER2/CEP17 Ratio <2.0 and average HER2 copy number 4.0 and <6.0 POSITIVE HER2/CEP17 Ratio >=2.0 or <2.0 and average HER2 copy number >=6.0 Mali RUND DO Pathologist, Electronic Signature ( Signed 11/26/2014) 2. her2 This is NOT signed out FINAL DIAGNOSIS Diagnosis 1. Lymph node, sentinel, biopsy, Right #1 - ONE LYMPH NODE, POSITIVE FOR METASTATIC MAMMARY CARCINOMA (1/1). - INTRANODAL TUMOR DEPOSIT IS 1.2 CM - POSITIVE FOR EXTRACAPSULAR TUMOR EXTENSION. 2. Breast, radical mastectomy (including lymph nodes), Right and axillary contents - INVASIVE DUCTAL CARCINOMA, SEE COMMENT. - POSITIVE FOR LYMPH VASCULAR INVASION. - DUCTAL CARCINOMA IN SITU WITH  NECROSIS AND CALCIFICATIONS. - FIFTEEN LYMPH NODE S, NEGATIVE FOR TUMOR (0/15). - PREVIOUS BIOPSY SITE. - SEE TUMOR SYNOPTIC TEMPLATE BELOW  Microscopic Comment 2. BREAST, INVASIVE TUMOR, WITH LYMPH NODES PRESENT Specimen, including laterality and lymph node sampling (sentinel, non-sentinel): Right breast with sentinel lymph node sampling Procedure: Radical mastectomy Histologic type: Ductal Grade: I of III Tubule formation: 2 Nuclear pleomorphism: 2 Mitotic: 1 Tumor size (gross measurement): 1.0 cm Margins: Invasive, distance to closest margin: 1.8 cm (posterior) In-situ, distance to closest margin: 1.8 cm (posterior) If margin positive, focally or broadly: N/A Lymphovascular invasion: Present Ductal carcinoma in situ: Present Grade: 2 of 3 Extensive intraductal component: Absent Lobular neoplasia: Absent Tumor focality: Unifocal Treatment effect: None If present, treatment effect in breast tissue, lymph nodes or both: N/A Extent of tumor: Skin: N/A Nipple: N/A Skeletal muscle: N/A Lymph nodes: Examined: 1 Sentinel 15 Non-sentinel 16 Total Lymph nodes with metastasis: 1 Isolated tumor cells (< 0.2 mm): 0 Micrometastasis: (> 0.2 mm and < 2.0 mm): 0 Macrometastasis: (> 2.0 mm): x 1 Extracapsular extension: Present Breast prognostic profile: Estrogen receptor: Not repeated, previous study demonstrated 100% positivity ([id number]) Progesterone receptor: Not repeated, previous study demonstrated 88% positivity ([id number]) Her 2 neu: Repeated, previous study demonstrated no amplification ([id number]) Ki-67: Not repeated, previous study demonstrated 19% proliferation rate ([id number]) Non-neoplastic breast: Previous biopsy site, fibrocystic change, columnar cell change, sclerosing adenosis, benign adenosis and calcifications TNM: p T1b, pN1a, pMX   RADIOGRAPHIC STUDIES: I have personally reviewed the radiological images as listed and agreed with the findings in the  report. No results found.  ASSESSMENT & PLAN:  Ms. Wakefield is a 61 year old female with h/o left DCIS s/p mastectomy who presents with R invasive ductal carcinoma s/p mastectomy  1. Stage IIa, pT1bpN1a,M0, right invasive ductal carcinoma s/p mastectomy, ER+/PR+/HER- , G1 -Discussed with the patient the results  of her surgical path and her cancer staging -The natural history of breast cancer were reviewed with her, and moderate risk of cancer recurrence after her surgery, giving the note positive disease. -She has now completed adjuvant chemotherapy, and is undergoing adjuvant radiation. -Giving her ER and PR positive tumor, I recommend antiestrogen therapy after she completes breast irradiation. -She would like to see a plastic surgeon for right implant placement in the future   2. Genetics -Giving her asthma history of breast cancer twice, and family history (sister) of breast cancer, I recommended genetic counseling but she declined.   -She does not have children, but does have sisters.  3. History of cirrhosis and HCV infection -No imaging or additional workup found in our system though noted to have undergone treatment for HCV in 1999 at [facility] per note by Aurora Mask  [06/02/09] -Most recent LFTs reassuring -She still has very high HCV load, will refer her to liver clinic or ID   4. Polysubstance abuse: Recently hospitalized in February 2016 for substance-induced mood disorder. -Continue to encourage patient to continue with substance rehab and avoid illicit substances -She agrees to quit alcohol and illicit drug (cocane) completely when she is on chemo, she will working on smoking cessation also -Her urine test was still positive for cocaine on August 9, we discussed cocaine cessation again, and she agrees to stop it completely  5. Hypertension:  -Continue follow-up with her primary care physician. -She has been slightly on high  6. Mood disorder:  -She does not follow  with a psychiatrist and reports her PCP prescribes all of her psychiatric medications.  -Defer to PCP  7. Right arm lymph edema  -continue PT  8. Pain from right arm and toes -I recommend her to increase Neurontin gradually from 100 mg 3 times daily to 235m 3 times daily if she can tolerate -She will also use Tylenol or ibuprofen as needed -I would avoid the narcotics due to the history of substance abuse  Plan -Continue breast radiation -I'll see her back in 5-6 weeks to start her on aromatase inhibitor -Increase Neurontin for pain control -She'll continue physical therapy for her right arm lymphedema  FTruitt Merle 05/24/16

## 2016-05-24 NOTE — Telephone Encounter (Signed)
Pt called to sch appt. Informed pt she had an appt today and we were unable to call due to number not working. Gave pt new appt date/time and updated contact info in EPIC

## 2016-06-03 ENCOUNTER — Other Ambulatory Visit: Payer: Self-pay | Admitting: Nurse Practitioner

## 2016-06-06 NOTE — Progress Notes (Signed)
[place]  [phone] [phone]  Clinic Follow Up Note   Patient Care Team: Elwyn Reach, MD as PCP - General (Internal Medicine) Autumn Messing III, MD as Consulting Physician (General Surgery) Truitt Merle, MD as Consulting Physician (Hematology) Kyung Rudd, MD as Consulting Physician (Radiation Oncology) Sylvan Cheese, NP as Nurse Practitioner (Hematology and Oncology) 06/07/2016  CHIEF COMPLAINTS:  Follow-up of breast cancer  Oncology History   Breast cancer of upper-outer quadrant of right female breast s/p Right MRM 10/30/14   Staging form: Breast, AJCC 7th Edition     Pathologic: Stage IIA (T1b, N1a, cM0) - Unsigned       Breast cancer of upper-outer quadrant of right female breast s/p Right MRM 10/30/14   06/03/2004 Cancer Diagnosis    Left breast DCIS, status post mastectomy      06/06/2014 Mammogram    Mammogram and ultrasound showed an irregular 0.7 cm mass at the 12:30 clock position of right breast. Axillary nodes were negative.      08/12/2014 Initial Biopsy    Right breast 12:30 o'clock biopsy showed invasive ductal carcinoma, grade 2. ER+ (100%), PR+ (88%), HER2/neu negative, Ki67 19%      08/12/2014 Clinical Stage    Stage IA: T1b N0      10/30/2014 Surgery    Right breast mastectomy and axillary node dissection, surgical margins were negative.       10/30/2014 Pathology Results    Right breast radical mastectomy showed invasive ductal carcinoma, grade 1, tumor measuring 1.0 cm, margins were negative lymphovascular invasion (+), 1 sentinel lymph nodes positive, 15 additional axillary nodes negative.  (+) DCIS       10/30/2014 Pathologic Stage    Stage IIA: pT1b pN1a      12/30/2014 - 03/03/2015 Chemotherapy    Docetaxel 75 mg/m, Cytoxan 600 mg/m, every 3 weeks, s/p 4 cycles       03/15/2015 - 03/22/2015 Hospital Admission    Patient was admitted for GI bleeding, required blood transfusion, EDC showed  esophagitis.      06/29/2015 - 08/07/2015 Radiation Therapy    Right chest wall 48 Gy in 24 fractions, right chest wall 1.8 Gy in 1 fractions, Right chest wall scar boost treated to 10 Gy in 5 fractions.       Anti-estrogen oral therapy    To discuss with Dr. Burr Medico 10/20/2015      10/21/2015 Survivorship    Survivorship care plan mailed to patient        HISTORY OF PRESENTING ILLNESS:  Marissa Brooks 61 y.o. female is here because of recently diagnosed right breast cancer.   This was discovered by screening mammogram in January 2016. She did not have palpable breast mass or any other symptoms. Biopsy on 08/12/2014 showed invasive ductal carcinoma, ER/PR positive, HER-2 negative. She underwent right breast mastectomy by Dr. Marcello Moores on 10/28/2014. Marland Kitchen   She has had some pain underneath her R armpit with some swelling. She reports that her "drain fell out" and was seen by Dr. Marlou Starks on 7/15 at which time a seroma was drained. Otherwise, she denies any problems with appetite, energy, pain. She does report having lost weight 195 lbs to 186 lbs though over an unspecified time interval. She is now menopausal with last menstrual period being sometime in her late 39s.  Per records obtained from [facility], she underwent left simple mastectomy on at [facility] on 07/16/04 after detection of multiple abnormalities in the left breast  on mammography and was diagnosed with DCIS. On 05/27/05, she underwent placement of left tissue expander but declined additional reconstructive surgery. On 06/15/06, she was seen by Dr. Sophronia Simas for possible adjuvant tamoxifen and was recommended to undergo close surveillance. 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   She follows with Dr. Gala Romney for PCP care and was last seen by them prior to the surgery. She is currently undergoing substance abuse rehab. She reports that 1 pack of cigarettes lasts her a week and has  not used cocaine for a month ago. She does report using heroin in the past. She also has history of bipolar for which she is on Abilify. She is widowed, has no children. She lives with a roommate who she has known for the last 10 years.  CURRENT THERAPY: pending adjuvant letrozole   INTERIM HISTORY Marissa Brooks returns for follow up. She is doing well today. She is here today with someone from her peer support group. She has not been seen in a year, she had several no show for her previously scheduled appointments.  Her right upper extremity is still swollen, she wears a compression sock. She went to PT, but it didn't help much. Pt would like to know if she can get the fluid drained. She has pain on her right side. She describes it as pressure. It comes and goes. She has some constipation and uses stool softeners. She was suppose to start her chemo pill last year, but never came in for a visit and a prescription. She is no longer smoking marijuana. She is trying to cut down on how much she uses cocaine. She is slowing down on smoking (4-5 cigarrettes a day) and is not drinking as much. She has been in a program called "[facility]" to help with her drug recovery for the past 2 months. She plans on graduating from this program and becoming independent. She is unsure if she has been taking Gabapentin. She is unsure when her last menstrual period was, but she is post menopausal. Pt still has some hot flashes.    MEDICAL HISTORY:  Past Medical History:  Diagnosis Date  . Allergy   . Anxiety   . Arthritis   . Bipolar disorder ([place])   . Breast cancer ([place]) 09/10/14   right breast,hx left breast ca,2002 [place]  . Cancer [facility]) 2002   left breast cancer tx in [place], right breast cancer 08/2014  . Cocaine abuse    last use 10/13/14  . Depression   . ETOH abuse    as of 10/21/14 none for a week  . GERD (gastroesophageal reflux disease)   . HCV (hepatitis C virus)   . Hypertension   .  Pneumonia   . Shortness of breath dyspnea    with walking  . Tobacco abuse     SURGICAL HISTORY: Past Surgical History:  Procedure Laterality Date  . BREAST SURGERY  2002   left mastectomy with implant  . ESOPHAGOGASTRODUODENOSCOPY (EGD) WITH PROPOFOL N/A 03/17/2015   Procedure: ESOPHAGOGASTRODUODENOSCOPY (EGD) WITH PROPOFOL;  Surgeon: Gatha Mayer, MD;  Location: [facility];  Service: Endoscopy;  Laterality: N/A;  . MANDIBLE FRACTURE SURGERY    . MASTECTOMY     and Implant  . MASTECTOMY W/ SENTINEL NODE BIOPSY Right 10/30/2014  . SIMPLE MASTECTOMY WITH AXILLARY SENTINEL NODE BIOPSY Right 10/30/2014   Procedure: RIGHT MASTECTOMY WITH SENTINEL NODE MAPPING;  Surgeon: Autumn Messing III,  MD;  Location: [place];  Service: General;  Laterality: Right;  . TONSILLECTOMY      SOCIAL HISTORY: Social History   Social History  . Marital status: Widowed    Spouse name: N/A  . Number of children: N/A  . Years of education: N/A   Occupational History  . Not on file.   Social History Main Topics  . Smoking status: Current Every Day Smoker    Packs/day: 0.25    Types: Cigarettes  . Smokeless tobacco: Never Used  . Alcohol use Yes     Comment: 3-4 40 oz/day   . Drug use:     Types: Cocaine, Marijuana     Comment: last use of crack this morning   . Sexual activity: Not Currently    Birth control/ protection: Abstinence   Other Topics Concern  . Not on file   Social History Narrative  . No narrative on file    FAMILY HISTORY: Family History  Problem Relation Age of Onset  . Mental illness Mother   . Breast cancer Sister 84  . HIV/AIDS Sister     Deceased    ALLERGIES:  is allergic to centrum.  MEDICATIONS:  Current Outpatient Prescriptions  Medication Sig Dispense Refill  . ARIPiprazole (ABILIFY) 5 MG tablet Take 1 tablet (5 mg total) by mouth daily. For mood control 30 tablet 0  . gabapentin (NEURONTIN) 100 MG capsule Take 2 capsules (200 mg total) by mouth 3 (three)  times daily. 180 capsule 2  . HYDROcodone-acetaminophen (NORCO) 5-325 MG tablet Take 1-2 tablets by mouth every 6 (six) hours as needed for moderate pain. 30 tablet 0  . lisinopril (PRINIVIL,ZESTRIL) 10 MG tablet Take 10 mg by mouth daily.    . sertraline (ZOLOFT) 100 MG tablet Take 1 tablet (100 mg total) by mouth at bedtime. For depression 30 tablet 0  . traZODone (DESYREL) 100 MG tablet Take 1 tablet (100 mg total) by mouth at bedtime. For sleep 30 tablet 0  . hyaluronate sodium (RADIAPLEXRX) GEL Apply 1 application topically 2 (two) times daily.    Marland Kitchen letrozole (FEMARA) 2.5 MG tablet Take 1 tablet (2.5 mg total) by mouth daily. 30 tablet 2  . non-metallic deodorant (ALRA) MISC Apply 1 application topically daily as needed. Reported on 07/10/2015     No current facility-administered medications for this visit.     REVIEW OF SYSTEMS:   Constitutional: Denies fevers, chills or abnormal night sweats (+) hot flashes Eyes: Denies blurriness of vision, double vision or watery eyes Ears, nose, mouth, throat, and face: Denies mucositis or sore throat Respiratory: Denies cough, dyspnea or wheezes Cardiovascular: Denies palpitation, chest discomfort or lower extremity swelling Gastrointestinal:  Denies nausea, heartburn or change in bowel habits Skin: Denies abnormal skin rashes Lymphatics: Denies new lymphadenopathy or easy bruising (+) R upper extremity lymphedema  Neurological:Denies numbness, tingling or new weaknesses Behavioral/Psych: Mood is stable, no new changes  All other systems were reviewed with the patient and are negative.  PHYSICAL EXAMINATION: ECOG PERFORMANCE STATUS: 0 - Asymptomatic  Vitals:   06/07/16 1528  BP: (!) 145/95  Pulse: (!) 49  Resp: 18  Temp: 97.9 F (36.6 C)   Filed Weights   06/07/16 1528  Weight: 178 lb 3.2 oz (80.8 kg)   GENERAL:alert, no distress and comfortable SKIN: skin color, texture, turgor are normal, no rashes or significant lesions, except  multiple large healing skim rash/ulcers and pigmentation/color change on b/l front arms, L>R. She has a healing skin  lesion on her scalp  EYES: normal, conjunctiva are pink and non-injected, sclera clear OROPHARYNX:no exudate, no erythema and lips, buccal mucosa, and tongue normal  NECK: supple, thyroid normal size, non-tender, without nodularity LYMPH:  no palpable lymphadenopathy in the cervical, axillary or inguinal BREAST: s/p bilateral mastectomy, s/p left implant placement. exam of the left breast, right chest wall and bilateral axilla revealed no palpable mass or adenopathy., (+) bilateral mastectomy, left silicone implant, no lymphadenopathy, otherwise negative. LUNGS: clear to auscultation and percussion with normal breathing effort HEART: regular rate & rhythm and no murmurs and no lower extremity edema ABDOMEN:abdomen soft, non-tender and normal bowel sounds Musculoskeletal:no cyanosis of digits and no clubbing  PSYCH: alert & oriented x 3 NEURO: no focal motor/sensory deficits Ext: Moderate right arm lymphedema involving the forearm and hand, right shoulder mobility is normal.  LABORATORY DATA:  I have reviewed the data as listed CBC Latest Ref Rng & Units 06/07/2016 08/26/2015 07/16/2015  WBC 3.9 - 10.3 10e3/uL 5.7 5.0 3.4(L)  Hemoglobin 11.6 - 15.9 g/dL 12.2 11.7(L) 12.2  Hematocrit 34.8 - 46.6 % 37.0 34.4(L) 36.1  Platelets 145 - 400 10e3/uL 129(L) 180 115(L)    CMP Latest Ref Rng & Units 06/07/2016 08/26/2015 07/16/2015  Glucose 70 - 140 mg/dl 88 101(H) 74  BUN 7.0 - 26.0 mg/dL 19.3 18 13.4  Creatinine 0.6 - 1.1 mg/dL 1.1 0.94 0.9  Sodium 136 - 145 mEq/L 143 144 141  Potassium 3.5 - 5.1 mEq/L 4.9 3.8 4.1  Chloride 101 - 111 mmol/L - 114(H) -  CO2 22 - 29 mEq/L 24 20(L) 26  Calcium 8.4 - 10.4 mg/dL 9.5 9.4 9.4  Total Protein 6.4 - 8.3 g/dL 7.1 7.7 7.3  Total Bilirubin 0.20 - 1.20 mg/dL 0.28 0.4 0.33  Alkaline Phos 40 - 150 U/L 77 82 96  AST 5 - 34 U/L 28 72(H) 57(H)  ALT 0  - 55 U/L 33 62(H) 56(H)    PATHOLOGY REPORT 10/30/2014 ADDITIONAL INFORMATION: 2. FLUORESCENCE IN-SITU HYBRIDIZATION  Results: HER2 - NEGATIVE RATIO OF HER2/CEP17 SIGNALS 1.62 AVERAGE HER2 COPY NUMBER PER CELL 2.10 Reference Range: NEGATIVE HER2/CEP17 Ratio <2.0 and average HER2 copy number <4.0 EQUIVOCAL HER2/CEP17 Ratio <2.0 and average HER2 copy number 4.0 and <6.0 POSITIVE HER2/CEP17 Ratio >=2.0 or <2.0 and average HER2 copy number >=6.0 Mali RUND DO Pathologist, Electronic Signature ( Signed 11/26/2014) 2. her2 This is NOT signed out FINAL DIAGNOSIS Diagnosis 1. Lymph node, sentinel, biopsy, Right #1 - ONE LYMPH NODE, POSITIVE FOR METASTATIC MAMMARY CARCINOMA (1/1). - INTRANODAL TUMOR DEPOSIT IS 1.2 CM - POSITIVE FOR EXTRACAPSULAR TUMOR EXTENSION. 2. Breast, radical mastectomy (including lymph nodes), Right and axillary contents - INVASIVE DUCTAL CARCINOMA, SEE COMMENT. - POSITIVE FOR LYMPH VASCULAR INVASION. - DUCTAL CARCINOMA IN SITU WITH NECROSIS AND CALCIFICATIONS. - FIFTEEN LYMPH NODE S, NEGATIVE FOR TUMOR (0/15). - PREVIOUS BIOPSY SITE. - SEE TUMOR SYNOPTIC TEMPLATE BELOW  Microscopic Comment 2. BREAST, INVASIVE TUMOR, WITH LYMPH NODES PRESENT Specimen, including laterality and lymph node sampling (sentinel, non-sentinel): Right breast with sentinel lymph node sampling Procedure: Radical mastectomy Histologic type: Ductal Grade: I of III Tubule formation: 2 Nuclear pleomorphism: 2 Mitotic: 1 Tumor size (gross measurement): 1.0 cm Margins: Invasive, distance to closest margin: 1.8 cm (posterior) In-situ, distance to closest margin: 1.8 cm (posterior) If margin positive, focally or broadly: N/A Lymphovascular invasion: Present Ductal carcinoma in situ: Present Grade: 2 of 3 Extensive intraductal component: Absent Lobular neoplasia: Absent Tumor focality: Unifocal Treatment effect: None If present,  treatment effect in breast tissue, lymph nodes or both:  N/A Extent of tumor: Skin: N/A Nipple: N/A Skeletal muscle: N/A Lymph nodes: Examined: 1 Sentinel 15 Non-sentinel 16 Total Lymph nodes with metastasis: 1 Isolated tumor cells (< 0.2 mm): 0 Micrometastasis: (> 0.2 mm and < 2.0 mm): 0 Macrometastasis: (> 2.0 mm): x 1 Extracapsular extension: Present Breast prognostic profile: Estrogen receptor: Not repeated, previous study demonstrated 100% positivity ([id number]) Progesterone receptor: Not repeated, previous study demonstrated 88% positivity ([id number]) Her 2 neu: Repeated, previous study demonstrated no amplification ([id number]) Ki-67: Not repeated, previous study demonstrated 19% proliferation rate ([id number]) Non-neoplastic breast: Previous biopsy site, fibrocystic change, columnar cell change, sclerosing adenosis, benign adenosis and calcifications TNM: p T1b, pN1a, pMX   RADIOGRAPHIC STUDIES: I have personally reviewed the radiological images as listed and agreed with the findings in the report. No results found.  ASSESSMENT & PLAN:  Ms. Amescua is a 61 y.o. female with h/o left DCIS s/p mastectomy who presents with R invasive ductal carcinoma s/p mastectomy  1. Stage IIa, pT1bpN1a,M0, right invasive ductal carcinoma s/p mastectomy, ER+/PR+/HER- , G1 -Discussed with the patient the results of her surgical path and her cancer staging -The natural history of breast cancer were reviewed with her, and moderate risk of cancer recurrence after her surgery, giving the note positive disease. -She has now completed adjuvant chemotherapy, and adjuvant radiation. -She did not get a breast reconstruction, but uses a breast prosthesis instead.  -I have reviewed the labs, CBC and CMP are WNL except mild thrombocytopenia  --Given the strong ER and PR positivity, I do recommend adjuvant aromatase inhibitor to reduce her risk of cancer recurrence,  The potential benefit and side effects, which includes but not limited to, hot flash, skin and  vaginal dryness, metabolic changes ( increased blood glucose, cholesterol, weight, etc.), slightly in increased risk of cardiovascular disease, cataracts, muscular and joint discomfort, osteopenia and osteoporosis, etc, were discussed with her in great details.  -She hasn't has a menstrual cycle  for several years, she is post menopause. I will start her on letrozole for the next 5-7 years. She agrees to start this week.   2. Genetics -Giving her asthma history of breast cancer twice, and family history (sister) of breast cancer, I recommended genetic counseling but she declined.   -She does not have children, but does have sisters.  3. History of cirrhosis and HCV infection -No imaging or additional workup found in our system though noted to have undergone treatment for HCV in 1999 at [facility] per note by Aurora Mask  [06/02/09] -Most recent LFTs reassuring -She still has very high HCV load, will refer her to liver clinic or ID   4. Polysubstance abuse: Recently hospitalized in February 2016 for substance-induced mood disorder. -Continue to encourage patient to continue with substance rehab and avoid illicit substances -She agrees to quit alcohol and illicit drug (cocaine) completely when she is on chemo, she will working on smoking cessation also -Her urine test was still positive for cocaine on August 9.  5. Hypertension:  -Continue follow-up with her primary care physician. -Her BP has been slightly elevated  6. Mood disorder:  -She does not follow with a psychiatrist and reports her PCP prescribes all of her psychiatric medications.  -Defer to PCP  7. Right arm lymph edema  -continue PT exercises at home -wears compression sleeve.   Plan  -I will start her on letrozole this week,  prescription was called in to her from 7  -  I have ordered a bone density scan prior to next visit. -I encouraged her to stop her . Marijuana and cocaine usage.  -She will return for a follow up  in 2 months.   This document serves as a record of services personally performed by Truitt Merle, MD. It was created on her behalf by Martinique Casey, a trained medical scribe. The creation of this record is based on the scribe's personal observations and the provider's statements to them. This document has been checked and approved by the attending provider.  Truitt Merle  06/07/16

## 2016-06-07 ENCOUNTER — Encounter: Payer: Self-pay | Admitting: Hematology

## 2016-06-07 ENCOUNTER — Telehealth: Payer: Self-pay | Admitting: Hematology

## 2016-06-07 ENCOUNTER — Ambulatory Visit (HOSPITAL_BASED_OUTPATIENT_CLINIC_OR_DEPARTMENT_OTHER): Payer: Medicare HMO | Admitting: Hematology

## 2016-06-07 ENCOUNTER — Other Ambulatory Visit (HOSPITAL_BASED_OUTPATIENT_CLINIC_OR_DEPARTMENT_OTHER): Payer: Medicare HMO

## 2016-06-07 VITALS — BP 145/95 | HR 49 | Temp 97.9°F | Resp 18 | Ht 62.0 in | Wt 178.2 lb

## 2016-06-07 DIAGNOSIS — C50911 Malignant neoplasm of unspecified site of right female breast: Secondary | ICD-10-CM

## 2016-06-07 DIAGNOSIS — I10 Essential (primary) hypertension: Secondary | ICD-10-CM | POA: Diagnosis not present

## 2016-06-07 DIAGNOSIS — Z170 Estrogen receptor positive status [ER+]: Principal | ICD-10-CM

## 2016-06-07 DIAGNOSIS — C50411 Malignant neoplasm of upper-outer quadrant of right female breast: Secondary | ICD-10-CM

## 2016-06-07 DIAGNOSIS — I890 Lymphedema, not elsewhere classified: Secondary | ICD-10-CM | POA: Diagnosis not present

## 2016-06-07 DIAGNOSIS — E2839 Other primary ovarian failure: Secondary | ICD-10-CM

## 2016-06-07 DIAGNOSIS — F1910 Other psychoactive substance abuse, uncomplicated: Secondary | ICD-10-CM

## 2016-06-07 DIAGNOSIS — K7460 Unspecified cirrhosis of liver: Secondary | ICD-10-CM

## 2016-06-07 DIAGNOSIS — N951 Menopausal and female climacteric states: Secondary | ICD-10-CM

## 2016-06-07 DIAGNOSIS — Z720 Tobacco use: Secondary | ICD-10-CM

## 2016-06-07 DIAGNOSIS — F39 Unspecified mood [affective] disorder: Secondary | ICD-10-CM

## 2016-06-07 LAB — CBC & DIFF AND RETIC
BASO%: 0.9 % (ref 0.0–2.0)
Basophils Absolute: 0.1 10*3/uL (ref 0.0–0.1)
EOS%: 5.3 % (ref 0.0–7.0)
Eosinophils Absolute: 0.3 10*3/uL (ref 0.0–0.5)
HCT: 37 % (ref 34.8–46.6)
HGB: 12.2 g/dL (ref 11.6–15.9)
Immature Retic Fract: 1.1 % — ABNORMAL LOW (ref 1.60–10.00)
LYMPH%: 26.8 % (ref 14.0–49.7)
MCH: 29 pg (ref 25.1–34.0)
MCHC: 33 g/dL (ref 31.5–36.0)
MCV: 88.1 fL (ref 79.5–101.0)
MONO#: 0.4 10*3/uL (ref 0.1–0.9)
MONO%: 7.2 % (ref 0.0–14.0)
NEUT%: 59.8 % (ref 38.4–76.8)
NEUTROS ABS: 3.4 10*3/uL (ref 1.5–6.5)
Platelets: 129 10*3/uL — ABNORMAL LOW (ref 145–400)
RBC: 4.2 10*6/uL (ref 3.70–5.45)
RDW: 14.4 % (ref 11.2–14.5)
Retic %: 1.21 % (ref 0.70–2.10)
Retic Ct Abs: 50.82 10*3/uL (ref 33.70–90.70)
WBC: 5.7 10*3/uL (ref 3.9–10.3)
lymph#: 1.5 10*3/uL (ref 0.9–3.3)

## 2016-06-07 LAB — COMPREHENSIVE METABOLIC PANEL
ALK PHOS: 77 U/L (ref 40–150)
ALT: 33 U/L (ref 0–55)
AST: 28 U/L (ref 5–34)
Albumin: 3.8 g/dL (ref 3.5–5.0)
Anion Gap: 9 mEq/L (ref 3–11)
BUN: 19.3 mg/dL (ref 7.0–26.0)
CO2: 24 meq/L (ref 22–29)
Calcium: 9.5 mg/dL (ref 8.4–10.4)
Chloride: 110 mEq/L — ABNORMAL HIGH (ref 98–109)
Creatinine: 1.1 mg/dL (ref 0.6–1.1)
EGFR: 67 mL/min/{1.73_m2} — AB (ref >90)
GLUCOSE: 88 mg/dL (ref 70–140)
POTASSIUM: 4.9 meq/L (ref 3.5–5.1)
SODIUM: 143 meq/L (ref 136–145)
Total Bilirubin: 0.28 mg/dL (ref 0.20–1.20)
Total Protein: 7.1 g/dL (ref 6.4–8.3)

## 2016-06-07 MED ORDER — LETROZOLE 2.5 MG PO TABS: 2.5000 mg | ORAL_TABLET | Freq: Every day | ORAL | 2 refills | Status: DC

## 2016-06-07 NOTE — Telephone Encounter (Signed)
Appointments scheduled per 1/16 LOS. Patient given AVS report and calendars with future scheduled appointments. °

## 2016-06-15 ENCOUNTER — Other Ambulatory Visit: Payer: Self-pay

## 2016-06-15 DIAGNOSIS — Z170 Estrogen receptor positive status [ER+]: Principal | ICD-10-CM

## 2016-06-15 DIAGNOSIS — C50411 Malignant neoplasm of upper-outer quadrant of right female breast: Secondary | ICD-10-CM

## 2016-06-15 NOTE — Progress Notes (Signed)
Received a call from cancer rehab to request for this pt to come in for evaluation of her sleeve. Sent referral today.

## 2016-06-21 ENCOUNTER — Ambulatory Visit: Payer: Medicare HMO | Admitting: Physical Therapy

## 2016-06-25 IMAGING — CR DG CHEST 2V
2 series · 2 of 2 positions shown · non-contrast
Comparison: Chest radiograph performed 10/21/2010

CLINICAL DATA: Acute onset of generalized weakness. Status post
fall. Initial encounter.

EXAM:
CHEST  2 VIEW

[w chest lat]
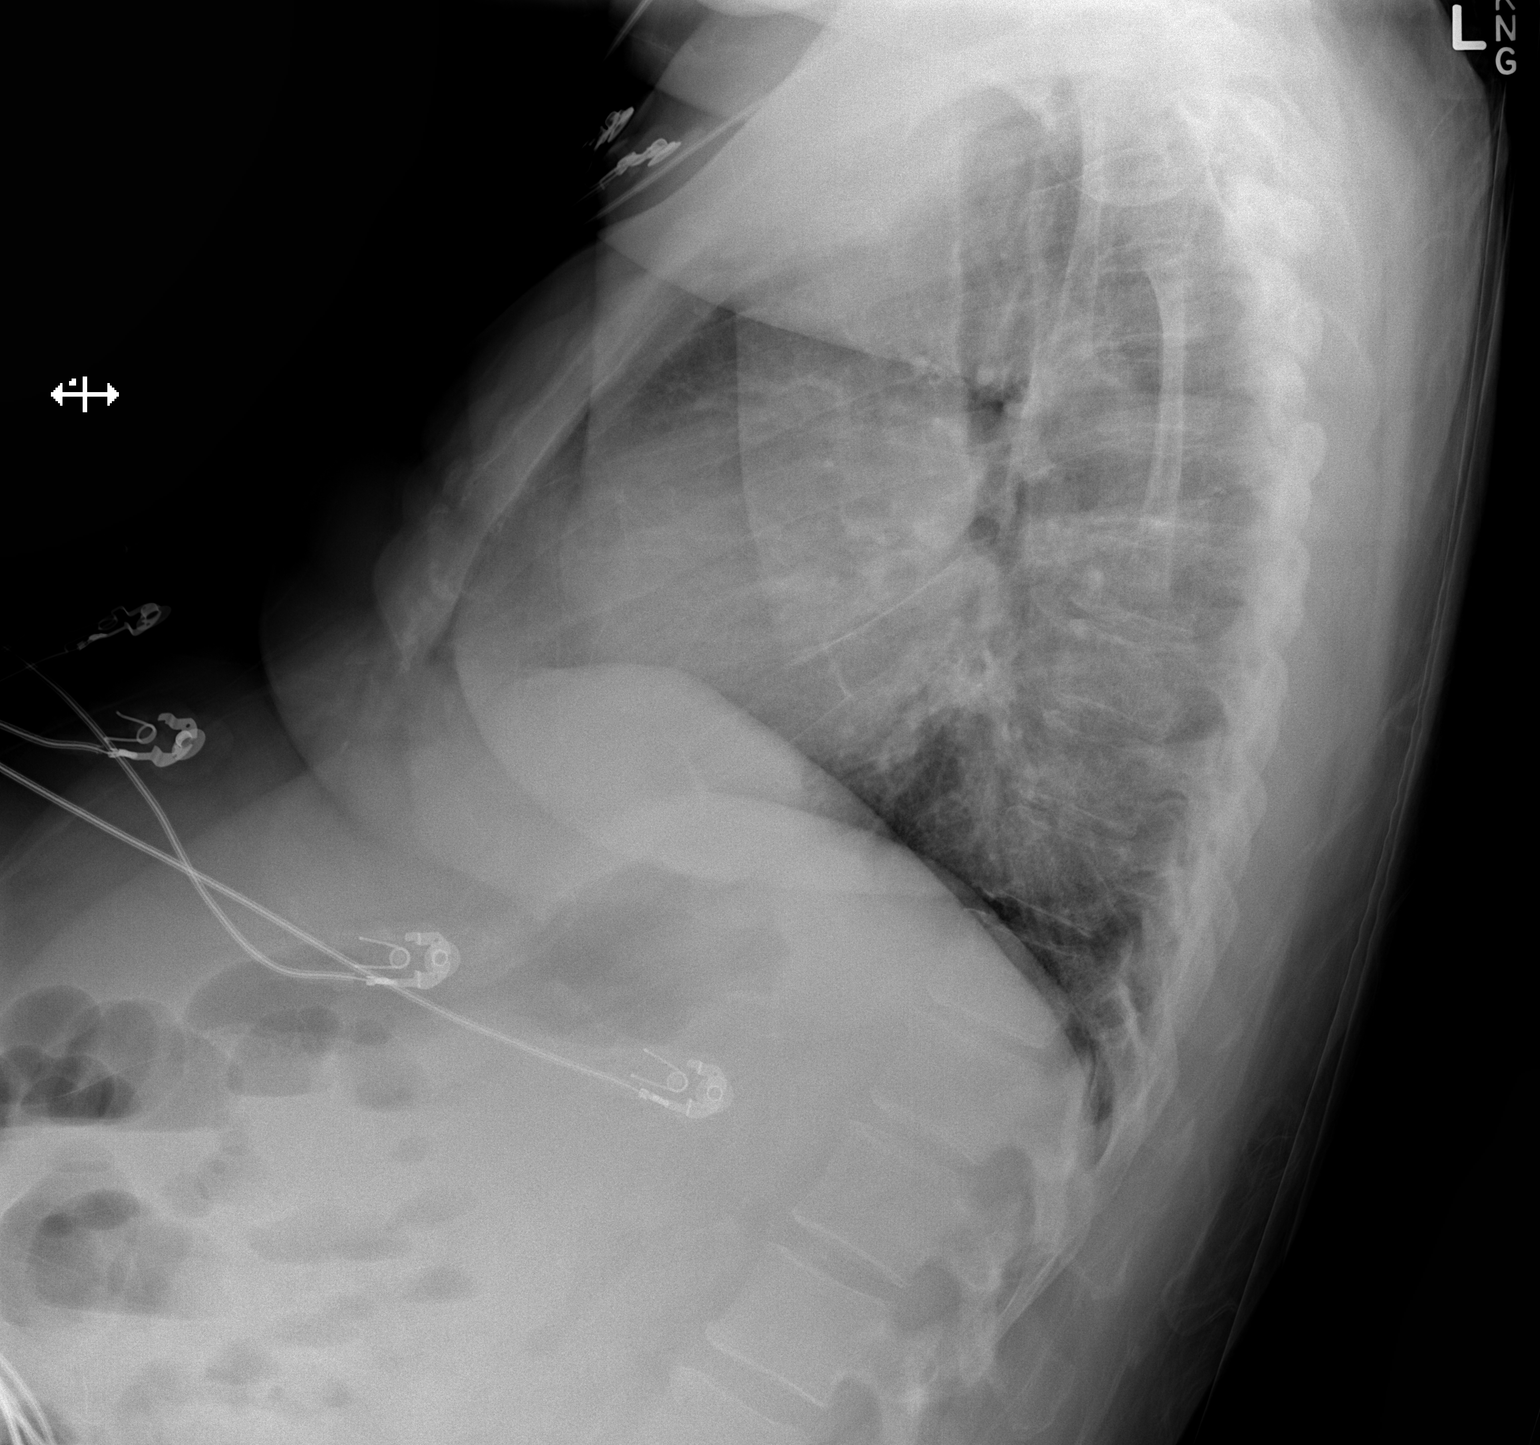

[x chest ap]
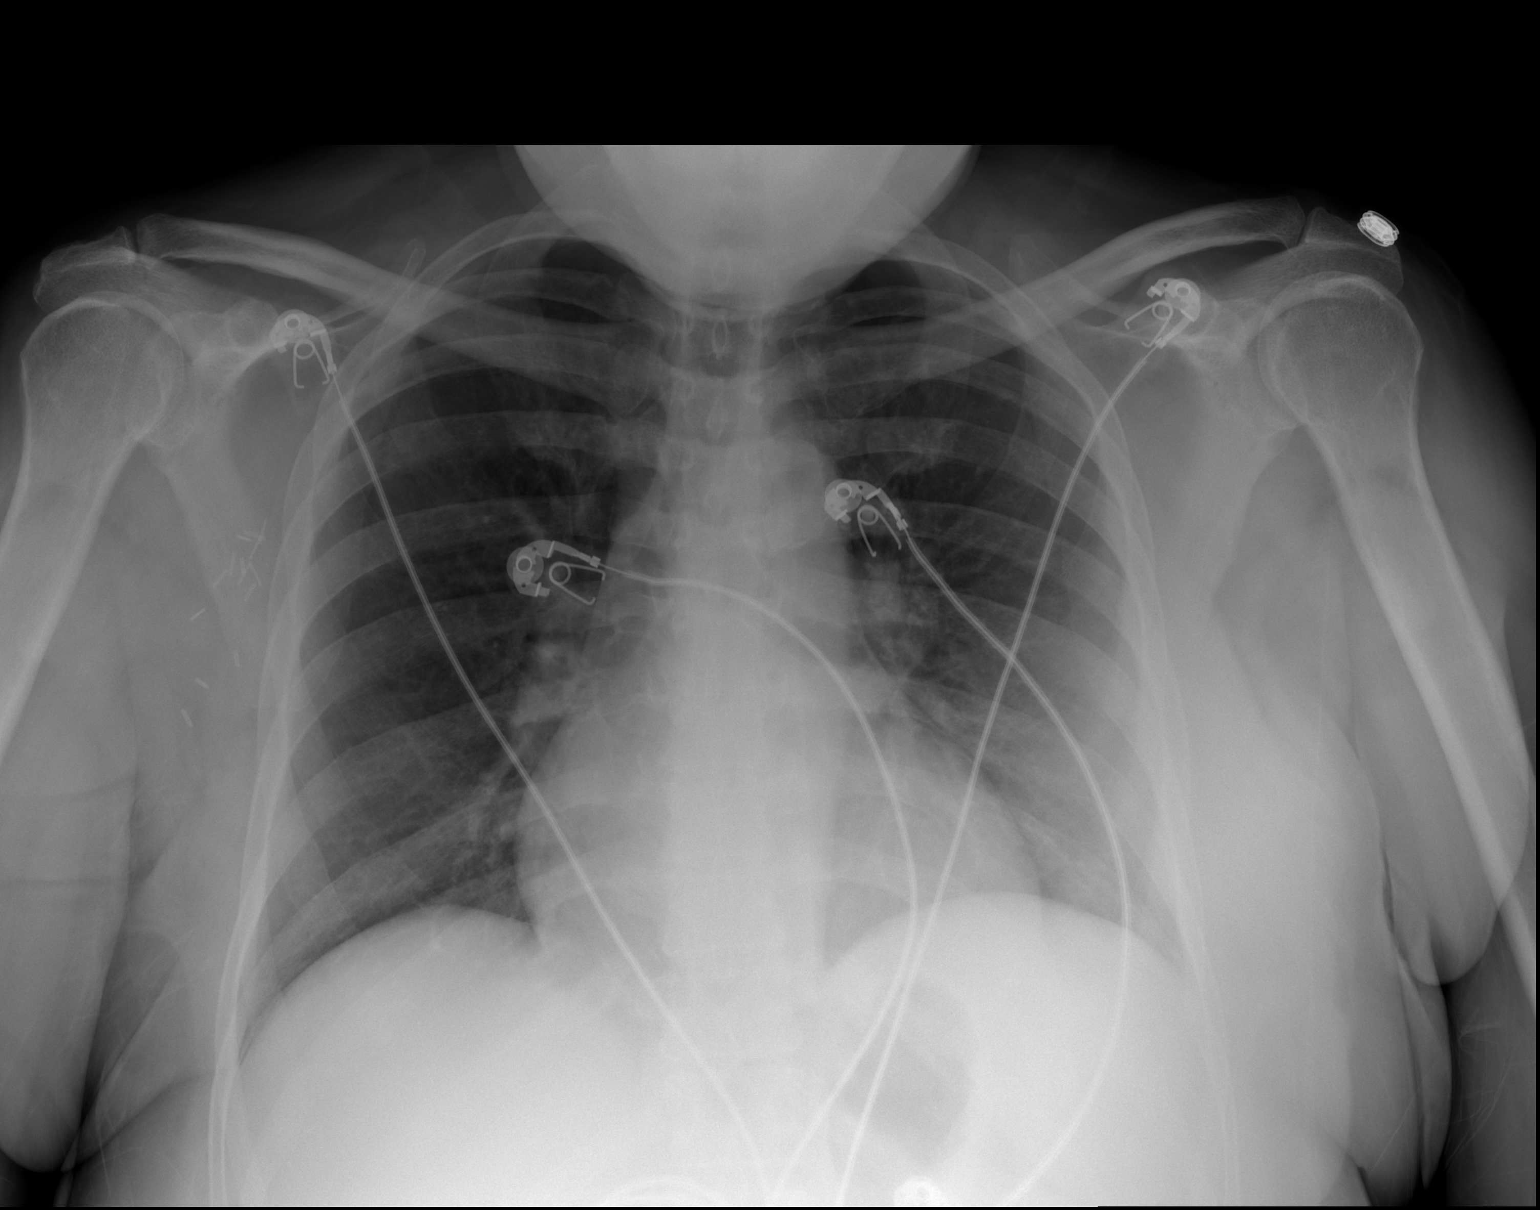

[2 of 2 positions shown; findings below may reference images not displayed]

FINDINGS: The lungs are well-aerated and clear. There is no evidence of focal
opacification, pleural effusion or pneumothorax.

The heart is borderline normal in size. No acute osseous
abnormalities are seen. Clips are seen at the right axilla.
IMPRESSION: No acute cardiopulmonary process seen.

## 2016-06-28 ENCOUNTER — Ambulatory Visit: Payer: Medicare HMO | Admitting: Physical Therapy

## 2016-06-29 ENCOUNTER — Ambulatory Visit: Payer: Medicare HMO | Attending: Hematology | Admitting: Physical Therapy

## 2016-06-29 DIAGNOSIS — I972 Postmastectomy lymphedema syndrome: Secondary | ICD-10-CM | POA: Diagnosis not present

## 2016-06-29 DIAGNOSIS — R293 Abnormal posture: Secondary | ICD-10-CM | POA: Insufficient documentation

## 2016-06-29 NOTE — Therapy (Signed)
[place], [place], [address] Phone: [phone]   Fax:  [phone]  Physical Therapy Evaluation  Patient Details  Name: Marissa Brooks MRN: [id number] Date of Birth: 1956/03/01 Referring Provider: Dr. Truitt Merle  Encounter Date: 06/29/2016      PT End of Session - 06/29/16 1528    Visit Number 1   Number of Visits 19   Date for PT Re-Evaluation 08/26/16   PT Start Time 1423   PT Stop Time 1500   PT Time Calculation (min) 37 min   Activity Tolerance Patient tolerated treatment well   Behavior During Therapy [facility] for tasks assessed/performed      Past Medical History:  Diagnosis Date  . Allergy   . Anxiety   . Arthritis   . Bipolar disorder ([place])   . Breast cancer ([place]) 09/10/14   right breast,hx left breast ca,2002 [place]  . Cancer [facility]) 2002   left breast cancer tx in [place], right breast cancer 08/2014  . Cocaine abuse    last use 10/13/14  . Depression   . ETOH abuse    as of 10/21/14 none for a week  . GERD (gastroesophageal reflux disease)   . HCV (hepatitis C virus)   . Hypertension   . Pneumonia   . Shortness of breath dyspnea    with walking  . Tobacco abuse     Past Surgical History:  Procedure Laterality Date  . BREAST SURGERY  2002   left mastectomy with implant  . ESOPHAGOGASTRODUODENOSCOPY (EGD) WITH PROPOFOL N/A 03/17/2015   Procedure: ESOPHAGOGASTRODUODENOSCOPY (EGD) WITH PROPOFOL;  Surgeon: Gatha Mayer, MD;  [facility];  Service: Endoscopy;  Laterality: N/A;  . MANDIBLE FRACTURE SURGERY    . MASTECTOMY     and Implant  . MASTECTOMY W/ SENTINEL NODE BIOPSY Right 10/30/2014  . SIMPLE MASTECTOMY WITH AXILLARY SENTINEL NODE BIOPSY Right 10/30/2014   Procedure: RIGHT MASTECTOMY WITH SENTINEL NODE MAPPING;  Surgeon: Autumn Messing III, MD;  Location: [facility];  Service: General;  Laterality: Right;  . TONSILLECTOMY      There were no vitals filed for  this visit.       Subjective Assessment - 06/29/16 1423    Subjective Needs a new compression garment; old one is worn out   Patient is accompained by: --  peer support person, Shawn Route   Pertinent History Bilateral mastectomies (left 2005 and right 10/30/14) with right SLNB.  Adjuvant chemotherapy 2016 and radiation for right chest in 2017.  Bipolar disorder managed with medication.  Pt. has been seen in this clinic in the past for treatment of right UE lymphedema.   Patient Stated Goals get new daytime garments   Currently in Pain? Yes   Pain Score 5    Pain Location Arm   Pain Orientation Right;Lower   Pain Descriptors / Indicators Aching   Pain Frequency Intermittent   Aggravating Factors  weather   Pain Relieving Factors sleeve            [facility] PT Assessment - 06/29/16 0001      Assessment   Medical Diagnosis bilateral mastectomies   Referring Provider Dr. Truitt Merle   Onset Date/Surgical Date 10/30/14  for right; left was 2005   Hand Dominance Left   Prior Therapy yes, here     Precautions   Precautions Other (comment)   Precaution Comments cancer precautions     Restrictions   Weight Bearing Restrictions No  Balance Screen   Has the patient fallen in the past 6 months No   Has the patient had a decrease in activity level because of a fear of falling?  No   Is the patient reluctant to leave their home because of a fear of falling?  No     Home Environment   Living Environment Private residence   Living Arrangements Other relatives  niece   Type of Home Apartment     Prior Function   Level of Independence Independent   Leisure no regular exercise; she does walk 2x/week 30-60 minutes     Cognition   Overall Cognitive Status Within Functional Limits for tasks assessed     Observation/Other Assessments   Other Surveys  --  lymphedema life impact scale score 32 = 47% impairment     Posture/Postural Control   Posture/Postural Control Postural  limitations   Postural Limitations Rounded Shoulders;Forward head     ROM / Strength   AROM / PROM / Strength AROM     AROM   Overall AROM Comments both shoulders grossly WFL with possible slight tightness on right           LYMPHEDEMA/ONCOLOGY QUESTIONNAIRE - 06/29/16 1440      Type   Cancer Type bilateral breast     Surgeries   Number Lymph Nodes Removed --  SLNB on right     Treatment   Past Chemotherapy Treatment Yes   Past Radiation Treatment Yes     Lymphedema Assessments   Lymphedema Assessments Upper extremities     Right Upper Extremity Lymphedema   15 cm Proximal to Olecranon Process 34.3 cm   10 cm Proximal to Olecranon Process 32.2 cm   Olecranon Process 27 cm   15 cm Proximal to Ulnar Styloid Process 27.8 cm   10 cm Proximal to Ulnar Styloid Process 24.2 cm   Just Proximal to Ulnar Styloid Process 17.4 cm   Across Hand at [employer] 19.7 cm   At [place] of 2nd Digit 6.1 cm     Left Upper Extremity Lymphedema   15 cm Proximal to Olecranon Process 30.4 cm   10 cm Proximal to Olecranon Process 29.3 cm   Olecranon Process 24.8 cm   15 cm Proximal to Ulnar Styloid Process 23.6 cm   10 cm Proximal to Ulnar Styloid Process 20.6 cm   Just Proximal to Ulnar Styloid Process 16.3 cm   Across Hand at [employer] 19.5 cm   At [place] of 2nd Digit 6.4 cm  over small ring                           Short Term Clinic Goals - 06/29/16 1535      CC Short Term Goal  #1   Title Right UE circumference at 15 cm. proximal to olecranon will reduce to 33 cm. or less   Baseline 34.3 at time of eval on 06/29/16 compared to 30.4 on left   Time 3   Period Weeks   Status New     CC Short Term Goal  #2   Title Right UE circumference at 15 cm. proximal to ulnar styloid will reduce to 26.5 cm. or less   Baseline 27.8 cm. at time of eval on 06/29/16 compared to 23.6 on left   Time 3   Period Weeks   Status New  Long Term Clinic Goals  - 06/29/16 1538      CC Long Term Goal  #1   Title Right UE circumference at 15 cm. proximal to olecranon will reduce to 32.5 cm. or less   Baseline 34.3 at time of eval on 06/29/16 compared to 30.4 on left   Time 6   Period Weeks   Status New     CC Long Term Goal  #2   Title Right UE circumference at 15 cm. proximal to ulnar styloid will reduce to 26 cm. or less   Baseline 27.8 cm. at time of eval on 06/29/16 compared to 23.6 on left   Time 6   Period Weeks   Status New     CC Long Term Goal  #3   Title Patient will have been fitted with new daytime compression sleeve and glove   Time 6   Period Weeks   Status New     CC Long Term Goal  #4   Title Patient will improve her lymphedema life impact scale score to </= 20 for improved function   Baseline 32 (47% impairment) at eval on 06/29/16   Time 6   Period Weeks   Status New            Plan - 06/29/16 1529    Clinical Impression Statement Patient known to this clinic from prior treatment for lymphedema returns now with goal of getting new daytime compression garments (she does have a nighttime garment). Her arm swelling doesn't look bad but is significant, with 3-4 cm. difference in size, the right being larger than the left.  She has a h/o right breast cancer with mastectomy 10/30/14 and left breast cancer with mastectomy in 2005.  Eval is low complexity.   Rehab Potential Good   PT Frequency 3x / week   PT Duration 6 weeks   PT Treatment/Interventions ADLs/Self Care Home Management;Therapeutic exercise;Patient/family education;Manual techniques;Manual lymph drainage;Compression bandaging;Taping   PT Next Visit Plan After discussion, patient chooses to undergo complete decongestive therapy again in an effort to get a reduction in right arm size before being fit with new garments.   PT Home Exercise Plan Pt. was given a copy today of remedial UE lymphedema exercises.   Recommended Other Services Will contact [employer] in [place] about Regulatory affairs officer.   Consulted and Agree with Plan of Care Patient  and peer support person who accompanied her      Patient will benefit from skilled therapeutic intervention in order to improve the following deficits and impairments:  Increased edema, Decreased knowledge of use of DME  Visit Diagnosis: Postmastectomy lymphedema - Plan: PT plan of care cert/re-cert  Abnormal posture - Plan: PT plan of care cert/re-cert      G-Codes - [id number]    Functional Assessment Tool Used lymphedema life impact scale   Functional Limitation Self care   Self Care Current Status [id number]) At least 40 percent but less than 60 percent impaired, limited or restricted   Self Care Goal Status [id number]) At least 1 percent but less than 20 percent impaired, limited or restricted       Problem List Patient Active Problem List   Diagnosis Date Noted  . Lymphedema of upper extremity 08/04/2015  . Shoulder pain, right 08/04/2015  . Radiation dermatitis 08/04/2015  . 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 AKI (acute kidney injury) ([place])   . Esophagitis   . Leukocytosis 03/17/2015  .  Acute esophagitis 03/17/2015  . HCAP (healthcare-associated pneumonia) 03/17/2015  . Hematemesis with nausea   . Acute blood loss anemia 03/16/2015  . Dysphagia 03/16/2015  . Hypocalcemia 03/16/2015  . GIB (gastrointestinal bleeding) 03/15/2015  . Tobacco abuse 03/15/2015  . ARF (acute renal failure) ([place]) 03/15/2015  . Hypokalemia 03/15/2015  . Depression 03/15/2015  . Hypertension   . GERD (gastroesophageal reflux disease)   . Anxiety   . Acute kidney injury ([place])   . Essential hypertension   . Gastroesophageal reflux disease without esophagitis   . Breast cancer of upper-outer quadrant of right female breast s/p Right MRM 10/30/14 10/30/2014  . Alcohol dependence ([place]) 07/09/2013  . MDD (major depressive disorder) ([place]) 07/09/2013  . Alcohol abuse with  alcohol-induced mood disorder ([place]) 07/08/2013  . 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 OBESITY 05/13/2010  . TONGUE DISORDER 01/20/2010  . BACK PAIN, LUMBAR 09/03/2008  . THROMBOCYTOPENIA 06/08/2007  . HCV (hepatitis C virus) 06/07/2007  . Bipolar disorder ([place]) 06/07/2007  . Substance abuse 06/07/2007  . PTSD 06/07/2007  . CONSTIPATION 06/07/2007  . HEPATIC CIRRHOSIS 06/07/2007  . UNSPECIFIED CONCUSSION 06/07/2007  . NEOPLASM, MALIGNANT, BREAST, HX OF 06/07/2007    SALISBUR[place],DONNA 06/29/2016, 3:50 PM  [place], [place], [address] Phone: [phone]   Fax:  [phone]  Name: AHMYRA WOODLE MRN: [id number] Date of Birth: [birth year]  Serafina Royals, PT 06/29/16 3:50 PM

## 2016-07-11 ENCOUNTER — Ambulatory Visit: Payer: Medicare HMO | Admitting: Physical Therapy

## 2016-07-13 ENCOUNTER — Ambulatory Visit: Payer: Medicare HMO | Admitting: Physical Therapy

## 2016-07-13 DIAGNOSIS — I972 Postmastectomy lymphedema syndrome: Secondary | ICD-10-CM | POA: Diagnosis not present

## 2016-07-13 NOTE — Patient Instructions (Signed)
Neck Side-Bending   Begin with chin level, head centered over spine. Slowly lower one ear toward shoulder keeping shoulder down. Hold __3__ seconds. Slowly return to starting position. Repeat to other side.  Repeat __5-10__ times to each side. Do 2-3 times a day. Also stretch by looking over right shoulder, then left.  Scapular Retraction (Standing)   With arms at [place], pinch shoulder blades together.  Hold 3 seconds. Repeat __5-10__ times per set.  Do __2-3__ sessions per day.   Active ROM Flexion    Raise arm to point to ceiling, keeping elbow straight. Hold __3__ seconds. Repeat _5-10___ times. Do _2-3___ sessions per day. Activity: Use this motion to reach for items on overhead shelves.     Flexion (Active)   Palm up, rest elbow on other hand. Bend as far as possible. Hold __3__ seconds. Repeat __5-10__ times. Do _2-3___ sessions per day. Can use other hand to bend elbow further if needed where bandage may limit end motion.  Copyright  VHI. All rights reserved.    Extension (Active With Finger Extension)   Llift hand with fingers straight, then bend wrist down.  Hold _3___ seconds each way. Repeat __5-10__ times. Do _2-3___ sessions per day.    AROM: Forearm Pronation / Supination   With right arm in handshake position, slowly rotate palm down. Relax. Then rotate palm up. Repeat __5-10__ times per set. Do __2-3__ sessions per day.  Copyright  VHI. All rights reserved.    AROM: Finger Flexion / Extension   Actively bend fingers of right hand. Start with knuckles furthest from palm, and slowly make a fist. Hold __3__ seconds. Relax. Then straighten fingers as far as possible. Repeat _5-10___ times per set.  Do __2-3__ sessions per day. Also spread fingers as far as able, then bring them back together.  Copyright  VHI. All rights reserved.   If any pain/tingling symptoms occur, try all of these exercises first to promote circulation. If symptoms do not  ease off, then remove bandages and bring all wrappings back to next appointment.    

## 2016-07-13 NOTE — Therapy (Signed)
[place], [place], [address] Phone: [phone]   Fax:  [phone]  Physical Therapy Treatment  Patient Details  Name: Marissa Brooks MRN: [id number] Date of Birth: 1955-11-10 Referring Provider: Dr. Truitt Merle  Encounter Date: 07/13/2016      PT End of Session - 07/13/16 1529    Visit Number 2   Number of Visits 19   Date for PT Re-Evaluation 08/26/16   PT Start Time 1350   PT Stop Time 1431   PT Time Calculation (min) 41 min   Activity Tolerance Patient tolerated treatment well   Behavior During Therapy [facility] for tasks assessed/performed      Past Medical History:  Diagnosis Date  . Allergy   . Anxiety   . Arthritis   . Bipolar disorder ([place])   . Breast cancer ([place]) 09/10/14   right breast,hx left breast ca,2002 [place]  . Cancer [facility]) 2002   left breast cancer tx in [place], right breast cancer 08/2014  . Cocaine abuse    last use 10/13/14  . Depression   . ETOH abuse    as of 10/21/14 none for a week  . GERD (gastroesophageal reflux disease)   . HCV (hepatitis C virus)   . Hypertension   . Pneumonia   . Shortness of breath dyspnea    with walking  . Tobacco abuse     Past Surgical History:  Procedure Laterality Date  . BREAST SURGERY  2002   left mastectomy with implant  . ESOPHAGOGASTRODUODENOSCOPY (EGD) WITH PROPOFOL N/A 03/17/2015   Procedure: ESOPHAGOGASTRODUODENOSCOPY (EGD) WITH PROPOFOL;  Surgeon: Gatha Mayer, MD;  [facility];  Service: Endoscopy;  Laterality: N/A;  . MANDIBLE FRACTURE SURGERY    . MASTECTOMY     and Implant  . MASTECTOMY W/ SENTINEL NODE BIOPSY Right 10/30/2014  . SIMPLE MASTECTOMY WITH AXILLARY SENTINEL NODE BIOPSY Right 10/30/2014   Procedure: RIGHT MASTECTOMY WITH SENTINEL NODE MAPPING;  Surgeon: Autumn Messing III, MD;  Location: [facility];  Service: General;  Laterality: Right;  . TONSILLECTOMY      There were no vitals filed for  this visit.      Subjective Assessment - 07/13/16 1352    Subjective Doing fine, it's just a long ride from [employer].  Got her ride schedule straightened out.  Hasn't heard from the fitter.   Currently in Pain? No/denies               LYMPHEDEMA/ONCOLOGY QUESTIONNAIRE - 07/13/16 1413      Right Upper Extremity Lymphedema   15 cm Proximal to Olecranon Process 34.2 cm   10 cm Proximal to Olecranon Process 32.3 cm   Olecranon Process 26.6 cm   15 cm Proximal to Ulnar Styloid Process 27.1 cm   10 cm Proximal to Ulnar Styloid Process 23.8 cm   Just Proximal to Ulnar Styloid Process 17 cm   Across Hand at [employer] 19.1 cm   At [place] of 2nd Digit 5.9 cm   Other measured after manual lymph drainage                  [facility] Adult PT Treatment/Exercise - 07/13/16 0001      Manual Therapy   Manual Therapy Edema management;Manual Lymphatic Drainage (MLD);Compression Bandaging   Edema Management circumference measurements taken after manual lymph drainage   Manual Lymphatic Drainage (MLD) In supine, short neck, superficial and deep abdomen, left axilla and anterior interaxillary  anastomosis, and right groin and axillo-inguinal anastomosis; then right UE from dorsal hand to axilla.  Then in left sidelying, posterior interaxillary anastomosis and right axillo-inguinal anastomosis.   Compression Bandaging lotion applied, then thin stockinette, Artiflex x 1, 1-6 cm., 1-8 cm., and 2-10 cm. short stretch bandages from hand to axilla                PT Education - 07/13/16 1529    Education provided Yes   Education Details UE remedial lymphedema exercises   Person(s) Educated Patient   Methods Explanation;Handout   Comprehension Verbalized understanding           Short Term Clinic Goals - 06/29/16 1535      CC Short Term Goal  #1   Title Right UE circumference at 15 cm. proximal to olecranon will reduce to 33 cm. or less   Baseline 34.3 at time of eval on  06/29/16 compared to 30.4 on left   Time 3   Period Weeks   Status New     CC Short Term Goal  #2   Title Right UE circumference at 15 cm. proximal to ulnar styloid will reduce to 26.5 cm. or less   Baseline 27.8 cm. at time of eval on 06/29/16 compared to 23.6 on left   Time 3   Period Weeks   Status New             Long Term Clinic Goals - 06/29/16 1538      CC Long Term Goal  #1   Title Right UE circumference at 15 cm. proximal to olecranon will reduce to 32.5 cm. or less   Baseline 34.3 at time of eval on 06/29/16 compared to 30.4 on left   Time 6   Period Weeks   Status New     CC Long Term Goal  #2   Title Right UE circumference at 15 cm. proximal to ulnar styloid will reduce to 26 cm. or less   Baseline 27.8 cm. at time of eval on 06/29/16 compared to 23.6 on left   Time 6   Period Weeks   Status New     CC Long Term Goal  #3   Title Patient will have been fitted with new daytime compression sleeve and glove   Time 6   Period Weeks   Status New     CC Long Term Goal  #4   Title Patient will improve her lymphedema life impact scale score to </= 20 for improved function   Baseline 32 (47% impairment) at eval on 06/29/16   Time 6   Period Weeks   Status New            Plan - 07/13/16 1530    Clinical Impression Statement Patient has not been here since two weeks ago and her upper arm circumference measurements are about the same as they were whereas the forearm measures have decreased a bit.     Rehab Potential Good   PT Frequency 3x / week   PT Duration 6 weeks   PT Treatment/Interventions ADLs/Self Care Home Management;Therapeutic exercise;Patient/family education;Manual techniques;Manual lymph drainage;Compression bandaging;Taping   PT Next Visit Plan continue complete decongestive therapy   Consulted and Agree with Plan of Care Patient      Patient will benefit from skilled therapeutic intervention in order to improve the following deficits and  impairments:  Increased edema, Decreased knowledge of use of DME  Visit Diagnosis: Postmastectomy lymphedema  Problem List Patient Active Problem List   Diagnosis Date Noted  . Lymphedema of upper extremity 08/04/2015  . Shoulder pain, right 08/04/2015  . Radiation dermatitis 08/04/2015  . 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 AKI (acute kidney injury) ([place])   . Esophagitis   . Leukocytosis 03/17/2015  . Acute esophagitis 03/17/2015  . HCAP (healthcare-associated pneumonia) 03/17/2015  . Hematemesis with nausea   . Acute blood loss anemia 03/16/2015  . Dysphagia 03/16/2015  . Hypocalcemia 03/16/2015  . GIB (gastrointestinal bleeding) 03/15/2015  . Tobacco abuse 03/15/2015  . ARF (acute renal failure) ([place]) 03/15/2015  . Hypokalemia 03/15/2015  . Depression 03/15/2015  . Hypertension   . GERD (gastroesophageal reflux disease)   . Anxiety   . Acute kidney injury ([place])   . Essential hypertension   . Gastroesophageal reflux disease without esophagitis   . Breast cancer of upper-outer quadrant of right female breast s/p Right MRM 10/30/14 10/30/2014  . Alcohol dependence ([place]) 07/09/2013  . MDD (major depressive disorder) ([place]) 07/09/2013  . Alcohol abuse with alcohol-induced mood disorder ([place]) 07/08/2013  . 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 OBESITY 05/13/2010  . TONGUE DISORDER 01/20/2010  . BACK PAIN, LUMBAR 09/03/2008  . THROMBOCYTOPENIA 06/08/2007  . HCV (hepatitis C virus) 06/07/2007  . Bipolar disorder ([place]) 06/07/2007  . Substance abuse 06/07/2007  . PTSD 06/07/2007  . CONSTIPATION 06/07/2007  . HEPATIC CIRRHOSIS 06/07/2007  . UNSPECIFIED CONCUSSION 06/07/2007  . NEOPLASM, MALIGNANT, BREAST, HX OF 06/07/2007    Marissa Brooks 07/13/2016, 10:03 PM  [place], [place], [address] Phone: [phone]   Fax:  [phone]  Name: Marissa Brooks MRN:  [id number] Date of Birth: May 17, 1956  Marissa Brooks, PT 07/13/16 10:04 PM

## 2016-07-15 ENCOUNTER — Ambulatory Visit: Payer: Medicare HMO | Admitting: Physical Therapy

## 2016-07-15 ENCOUNTER — Encounter: Payer: Self-pay | Admitting: Physical Therapy

## 2016-07-15 DIAGNOSIS — I972 Postmastectomy lymphedema syndrome: Secondary | ICD-10-CM | POA: Diagnosis not present

## 2016-07-15 NOTE — Therapy (Signed)
[place], [place], [address] Phone: [phone]   Fax:  [phone]  Physical Therapy Treatment  Patient Details  Name: Marissa Brooks MRN: [id number] Date of Birth: Mar 23, 1956 Referring Provider: Dr. Truitt Merle  Encounter Date: 07/15/2016      PT End of Session - 07/15/16 1205    Visit Number 3   Number of Visits 19   Date for PT Re-Evaluation 08/26/16   PT Start Time 1105   PT Stop Time 1146   PT Time Calculation (min) 41 min   Activity Tolerance Patient tolerated treatment well   Behavior During Therapy [facility] for tasks assessed/performed      Past Medical History:  Diagnosis Date  . Allergy   . Anxiety   . Arthritis   . Bipolar disorder ([place])   . Breast cancer ([place]) 09/10/14   right breast,hx left breast ca,2002 [place]  . Cancer [facility]) 2002   left breast cancer tx in [place], right breast cancer 08/2014  . Cocaine abuse    last use 10/13/14  . Depression   . ETOH abuse    as of 10/21/14 none for a week  . GERD (gastroesophageal reflux disease)   . HCV (hepatitis C virus)   . Hypertension   . Pneumonia   . Shortness of breath dyspnea    with walking  . Tobacco abuse     Past Surgical History:  Procedure Laterality Date  . BREAST SURGERY  2002   left mastectomy with implant  . ESOPHAGOGASTRODUODENOSCOPY (EGD) WITH PROPOFOL N/A 03/17/2015   Procedure: ESOPHAGOGASTRODUODENOSCOPY (EGD) WITH PROPOFOL;  Surgeon: Gatha Mayer, MD;  Location: [facility];  Service: Endoscopy;  Laterality: N/A;  . MANDIBLE FRACTURE SURGERY    . MASTECTOMY     and Implant  . MASTECTOMY W/ SENTINEL NODE BIOPSY Right 10/30/2014  . SIMPLE MASTECTOMY WITH AXILLARY SENTINEL NODE BIOPSY Right 10/30/2014   Procedure: RIGHT MASTECTOMY WITH SENTINEL NODE MAPPING;  Surgeon: Autumn Messing III, MD;  Location: [place];  Service: General;  Laterality: Right;  . TONSILLECTOMY      There were no vitals filed for  this visit.      Subjective Assessment - 07/15/16 1106    Subjective My arm has been okay. It just keeps swelling up and going down.    Pertinent History Bilateral mastectomies (left 2005 and right 10/30/14) with right SLNB.  Adjuvant chemotherapy 2016 and radiation for right chest in 2017.  Bipolar disorder managed with medication.  Pt. has been seen in this clinic in the past for treatment of right UE lymphedema.   Patient Stated Goals get new daytime garments   Currently in Pain? No/denies   Pain Score 0-No pain                         [facility] Adult PT Treatment/Exercise - 07/15/16 0001      Manual Therapy   Manual Therapy Edema management;Manual Lymphatic Drainage (MLD);Compression Bandaging   Manual Lymphatic Drainage (MLD) In supine, short neck, superficial and deep abdomen, left axilla and anterior interaxillary anastomosis, and right groin and axillo-inguinal anastomosis; then right UE from dorsal hand to axilla.  Then in left sidelying, posterior interaxillary anastomosis and right axillo-inguinal anastomosis.   Compression Bandaging lotion applied, pt had a rash characterized by small red dots in her antecubital fossa- changed to TG soft to see if this improved comfort and skin integrity, Artiflex x 1, 1-6  cm., 1-8 cm., and 2-10 cm. short stretch bandages from hand to axilla                   Short Term Clinic Goals - 07/15/16 1204      CC Short Term Goal  #1   Title Right UE circumference at 15 cm. proximal to olecranon will reduce to 33 cm. or less   Baseline 34.3 at time of eval on 06/29/16 compared to 30.4 on left, 07/15/16-34.2   Time 3   Period Weeks   Status On-going     CC Short Term Goal  #2   Title Right UE circumference at 15 cm. proximal to ulnar styloid will reduce to 26.5 cm. or less   Baseline 27.8 cm. at time of eval on 06/29/16 compared to 23.6 on left, 07/15/16- 27.1   Time 3   Period Weeks   Status On-going             Long  Term Clinic Goals - 06/29/16 1538      CC Long Term Goal  #1   Title Right UE circumference at 15 cm. proximal to olecranon will reduce to 32.5 cm. or less   Baseline 34.3 at time of eval on 06/29/16 compared to 30.4 on left   Time 6   Period Weeks   Status New     CC Long Term Goal  #2   Title Right UE circumference at 15 cm. proximal to ulnar styloid will reduce to 26 cm. or less   Baseline 27.8 cm. at time of eval on 06/29/16 compared to 23.6 on left   Time 6   Period Weeks   Status New     CC Long Term Goal  #3   Title Patient will have been fitted with new daytime compression sleeve and glove   Time 6   Period Weeks   Status New     CC Long Term Goal  #4   Title Patient will improve her lymphedema life impact scale score to </= 20 for improved function   Baseline 32 (47% impairment) at eval on 06/29/16   Time 6   Period Weeks   Status New            Plan - 07/15/16 1205    Clinical Impression Statement Pt has rash characterized by small red raised dots in antecubital fossa. Switched to TG soft instead of thin stockinette to improve comfort and protect skin integrity. Continued with MLD to RUE and compression banadaging.    Rehab Potential Good   PT Frequency 3x / week   PT Duration 6 weeks   PT Treatment/Interventions ADLs/Self Care Home Management;Therapeutic exercise;Patient/family education;Manual techniques;Manual lymph drainage;Compression bandaging;Taping   PT Next Visit Plan continue complete decongestive therapy   PT Home Exercise Plan Pt. was given a copy today of remedial UE lymphedema exercises.   Consulted and Agree with Plan of Care Patient      Patient will benefit from skilled therapeutic intervention in order to improve the following deficits and impairments:  Increased edema, Decreased knowledge of use of DME  Visit Diagnosis: Postmastectomy lymphedema     Problem List Patient Active Problem List   Diagnosis Date Noted  . Lymphedema of upper  extremity 08/04/2015  . Shoulder pain, right 08/04/2015  . Radiation dermatitis 08/04/2015  . 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 AKI (acute kidney injury) ([place])   . Esophagitis   . Leukocytosis 03/17/2015  .  Acute esophagitis 03/17/2015  . HCAP (healthcare-associated pneumonia) 03/17/2015  . Hematemesis with nausea   . Acute blood loss anemia 03/16/2015  . Dysphagia 03/16/2015  . Hypocalcemia 03/16/2015  . GIB (gastrointestinal bleeding) 03/15/2015  . Tobacco abuse 03/15/2015  . ARF (acute renal failure) ([place]) 03/15/2015  . Hypokalemia 03/15/2015  . Depression 03/15/2015  . Hypertension   . GERD (gastroesophageal reflux disease)   . Anxiety   . Acute kidney injury ([place])   . Essential hypertension   . Gastroesophageal reflux disease without esophagitis   . Breast cancer of upper-outer quadrant of right female breast s/p Right MRM 10/30/14 10/30/2014  . Alcohol dependence ([place]) 07/09/2013  . MDD (major depressive disorder) ([place]) 07/09/2013  . Alcohol abuse with alcohol-induced mood disorder ([place]) 07/08/2013  . 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 OBESITY 05/13/2010  . TONGUE DISORDER 01/20/2010  . BACK PAIN, LUMBAR 09/03/2008  . THROMBOCYTOPENIA 06/08/2007  . HCV (hepatitis C virus) 06/07/2007  . Bipolar disorder ([place]) 06/07/2007  . Substance abuse 06/07/2007  . PTSD 06/07/2007  . CONSTIPATION 06/07/2007  . HEPATIC CIRRHOSIS 06/07/2007  . UNSPECIFIED CONCUSSION 06/07/2007  . NEOPLASM, MALIGNANT, BREAST, HX OF 06/07/2007    Allyson Sabal [facility] 07/15/2016, 12:07 PM  [place], [place], [address] Phone: [phone]   Fax:  [phone]  Name: JAZLYN GERGELY MRN: [id number] Date of Birth: February 14, 1956  Manus Gunning, PT 07/15/16 12:07 PM

## 2016-07-18 ENCOUNTER — Ambulatory Visit: Payer: Medicare HMO | Admitting: Physical Therapy

## 2016-07-18 ENCOUNTER — Encounter: Payer: Self-pay | Admitting: Physical Therapy

## 2016-07-18 DIAGNOSIS — I972 Postmastectomy lymphedema syndrome: Secondary | ICD-10-CM | POA: Diagnosis not present

## 2016-07-18 NOTE — Therapy (Signed)
[place], [place], [address] Phone: [phone]   Fax:  [phone]  Physical Therapy Treatment  Patient Details  Name: Marissa Brooks MRN: [id number] Date of Birth: 1955-06-27 Referring Provider: Dr. Truitt Merle  Encounter Date: 07/18/2016      PT End of Session - 07/18/16 1514    Visit Number 4   Number of Visits 19   Date for PT Re-Evaluation 08/26/16   PT Start Time 1430   PT Stop Time 1513   PT Time Calculation (min) 43 min   Activity Tolerance Patient tolerated treatment well   Behavior During Therapy [facility] for tasks assessed/performed      Past Medical History:  Diagnosis Date  . Allergy   . Anxiety   . Arthritis   . Bipolar disorder ([place])   . Breast cancer ([place]) 09/10/14   right breast,hx left breast ca,2002 [place]  . Cancer [facility]) 2002   left breast cancer tx in [place], right breast cancer 08/2014  . Cocaine abuse    last use 10/13/14  . Depression   . ETOH abuse    as of 10/21/14 none for a week  . GERD (gastroesophageal reflux disease)   . HCV (hepatitis C virus)   . Hypertension   . Pneumonia   . Shortness of breath dyspnea    with walking  . Tobacco abuse     Past Surgical History:  Procedure Laterality Date  . BREAST SURGERY  2002   left mastectomy with implant  . ESOPHAGOGASTRODUODENOSCOPY (EGD) WITH PROPOFOL N/A 03/17/2015   Procedure: ESOPHAGOGASTRODUODENOSCOPY (EGD) WITH PROPOFOL;  Surgeon: Gatha Mayer, MD;  [facility];  Service: Endoscopy;  Laterality: N/A;  . MANDIBLE FRACTURE SURGERY    . MASTECTOMY     and Implant  . MASTECTOMY W/ SENTINEL NODE BIOPSY Right 10/30/2014  . SIMPLE MASTECTOMY WITH AXILLARY SENTINEL NODE BIOPSY Right 10/30/2014   Procedure: RIGHT MASTECTOMY WITH SENTINEL NODE MAPPING;  Surgeon: Autumn Messing III, MD;  Location: [place];  Service: General;  Laterality: Right;  . TONSILLECTOMY      There were no vitals filed for  this visit.      Subjective Assessment - 07/18/16 1432    Subjective I took my bandages off on Saturday night. It was smaller than this. I took my bandages off because I wanted to take a shower.    Pertinent History Bilateral mastectomies (left 2005 and right 10/30/14) with right SLNB.  Adjuvant chemotherapy 2016 and radiation for right chest in 2017.  Bipolar disorder managed with medication.  Pt. has been seen in this clinic in the past for treatment of right UE lymphedema.   Patient Stated Goals get new daytime garments   Currently in Pain? No/denies   Pain Score 0-No pain                         [facility] Adult PT Treatment/Exercise - 07/18/16 0001      Manual Therapy   Manual Therapy Edema management;Manual Lymphatic Drainage (MLD);Compression Bandaging   Manual Lymphatic Drainage (MLD) In supine, short neck, superficial and deep abdomen, left axilla and anterior interaxillary anastomosis, and right groin and axillo-inguinal anastomosis; then right UE from dorsal hand to axilla.  Then in left sidelying, posterior interaxillary anastomosis and right axillo-inguinal anastomosis.   Compression Bandaging lotion applied, TG soft, Artiflex x 1, 1-6 cm., 1-8 cm., and 2-10 cm. short stretch bandages from hand to  axilla                   Short Term Clinic Goals - 07/15/16 1204      CC Short Term Goal  #1   Title Right UE circumference at 15 cm. proximal to olecranon will reduce to 33 cm. or less   Baseline 34.3 at time of eval on 06/29/16 compared to 30.4 on left, 07/15/16-34.2   Time 3   Period Weeks   Status On-going     CC Short Term Goal  #2   Title Right UE circumference at 15 cm. proximal to ulnar styloid will reduce to 26.5 cm. or less   Baseline 27.8 cm. at time of eval on 06/29/16 compared to 23.6 on left, 07/15/16- 27.1   Time 3   Period Weeks   Status On-going             Long Term Clinic Goals - 06/29/16 1538      CC Long Term Goal  #1   Title  Right UE circumference at 15 cm. proximal to olecranon will reduce to 32.5 cm. or less   Baseline 34.3 at time of eval on 06/29/16 compared to 30.4 on left   Time 6   Period Weeks   Status New     CC Long Term Goal  #2   Title Right UE circumference at 15 cm. proximal to ulnar styloid will reduce to 26 cm. or less   Baseline 27.8 cm. at time of eval on 06/29/16 compared to 23.6 on left   Time 6   Period Weeks   Status New     CC Long Term Goal  #3   Title Patient will have been fitted with new daytime compression sleeve and glove   Time 6   Period Weeks   Status New     CC Long Term Goal  #4   Title Patient will improve her lymphedema life impact scale score to </= 20 for improved function   Baseline 32 (47% impairment) at eval on 06/29/16   Time 6   Period Weeks   Status New            Plan - 07/18/16 1514    Clinical Impression Statement Pt's rash has completely cleared up since using the TG soft instead of thin stockinette. She is to be measured for a compression sleeve tomorrow at this clinic. Will continue with complete decongestive therapy until pt obtains new compression sleeve for long term management of edema.    Rehab Potential Good   PT Frequency 3x / week   PT Duration 6 weeks   PT Treatment/Interventions ADLs/Self Care Home Management;Therapeutic exercise;Patient/family education;Manual techniques;Manual lymph drainage;Compression bandaging;Taping   PT Next Visit Plan continue complete decongestive therapy   PT Home Exercise Plan Pt. was given a copy today of remedial UE lymphedema exercises.   Consulted and Agree with Plan of Care Patient      Patient will benefit from skilled therapeutic intervention in order to improve the following deficits and impairments:  Increased edema, Decreased knowledge of use of DME  Visit Diagnosis: Postmastectomy lymphedema     Problem List Patient Active Problem List   Diagnosis Date Noted  . Lymphedema of upper extremity  08/04/2015  . Shoulder pain, right 08/04/2015  . Radiation dermatitis 08/04/2015  . 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 AKI (acute kidney injury) ([place])   . Esophagitis   . Leukocytosis 03/17/2015  . Acute  esophagitis 03/17/2015  . HCAP (healthcare-associated pneumonia) 03/17/2015  . Hematemesis with nausea   . Acute blood loss anemia 03/16/2015  . Dysphagia 03/16/2015  . Hypocalcemia 03/16/2015  . GIB (gastrointestinal bleeding) 03/15/2015  . Tobacco abuse 03/15/2015  . ARF (acute renal failure) ([place]) 03/15/2015  . Hypokalemia 03/15/2015  . Depression 03/15/2015  . Hypertension   . GERD (gastroesophageal reflux disease)   . Anxiety   . Acute kidney injury ([place])   . Essential hypertension   . Gastroesophageal reflux disease without esophagitis   . Breast cancer of upper-outer quadrant of right female breast s/p Right MRM 10/30/14 10/30/2014  . Alcohol dependence ([place]) 07/09/2013  . MDD (major depressive disorder) ([place]) 07/09/2013  . Alcohol abuse with alcohol-induced mood disorder ([place]) 07/08/2013  . 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 OBESITY 05/13/2010  . TONGUE DISORDER 01/20/2010  . BACK PAIN, LUMBAR 09/03/2008  . THROMBOCYTOPENIA 06/08/2007  . HCV (hepatitis C virus) 06/07/2007  . Bipolar disorder ([place]) 06/07/2007  . Substance abuse 06/07/2007  . PTSD 06/07/2007  . CONSTIPATION 06/07/2007  . HEPATIC CIRRHOSIS 06/07/2007  . UNSPECIFIED CONCUSSION 06/07/2007  . NEOPLASM, MALIGNANT, BREAST, HX OF 06/07/2007    Allyson Sabal [facility] 07/18/2016, 3:16 PM  [place], [place], [address] Phone: [phone]   Fax:  [phone]  Name: MEREDYTH MALLERY MRN: [id number] Date of Birth: 28-Nov-1955  Manus Gunning, PT 07/18/16 3:16 PM

## 2016-07-19 IMAGING — RF DG ESOPHAGUS
14 of 18 series · 14 of 18 positions shown · IV contrast (omnipaque)
Comparison: Portable chest radiograph 2229 hours today and earlier.

CLINICAL DATA: 59-year-old female status post endoscopy earlier
today demonstrating diffuse severe esophagitis. Query
perforation/leak. Initial encounter.

EXAM:
ESOPHOGRAM/BARIUM SWALLOW
TECHNIQUE: Single contrast examination was performed using initially
water-soluble contrast (Omnipaque 300), ultimately thin barium.
FLUOROSCOPY TIME:  Radiation Exposure Index (as provided by the
fluoroscopic device):
If the device does not provide the exposure index:
Fluoroscopy Time:  2 minutes 56 seconds
Number of Acquired Images:  Two

[Series 1: run · 1 of 1 slices shown (1 of 14)]
[im 1/1]
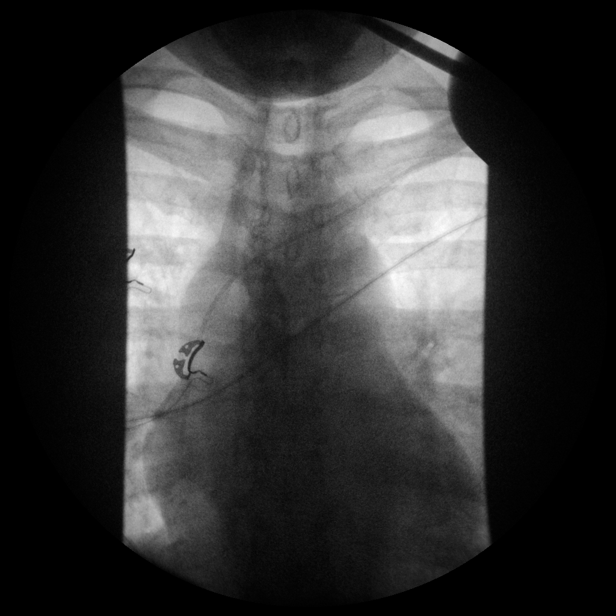

[Series 2: run · 1 of 1 slices shown (2 of 14)]
[im 1/1]
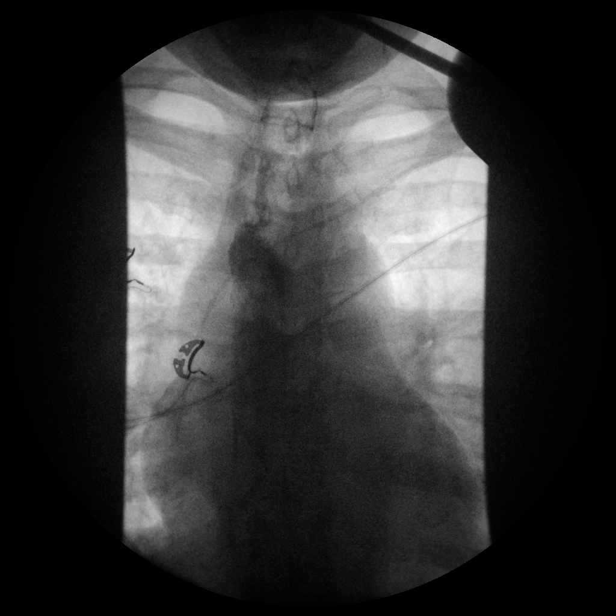

[Series 4: run · 1 of 1 slices shown (3 of 14)]
[im 1/1]
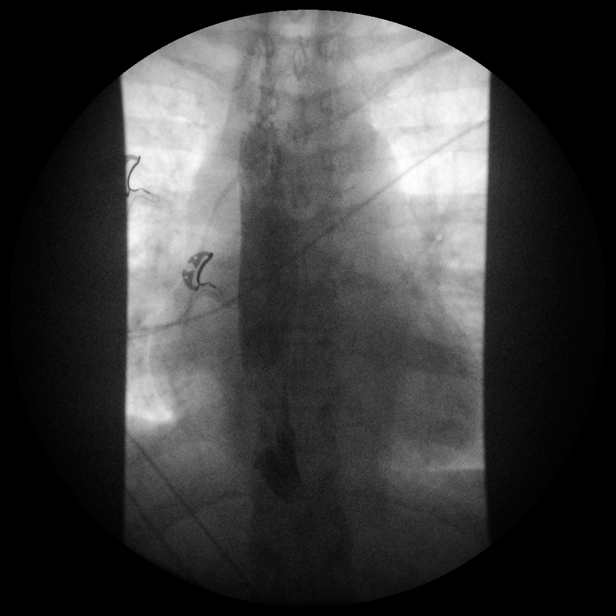

[Series 5: run · 1 of 1 slices shown (4 of 14)]
[im 1/1]
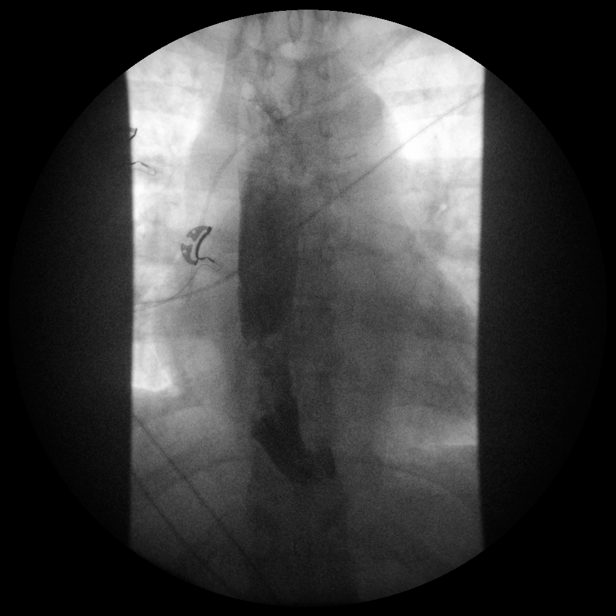

[Series 6: run · 1 of 1 slices shown (5 of 14)]
[im 1/1]
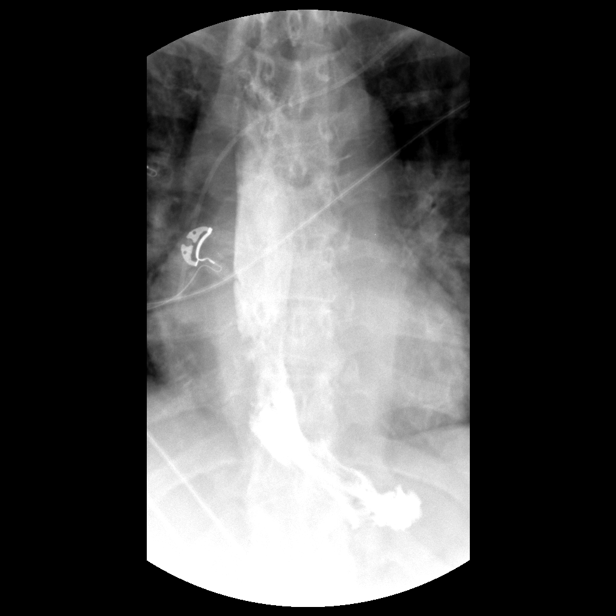

[Series 8: run · 1 of 1 slices shown (6 of 14)]
[im 1/1]
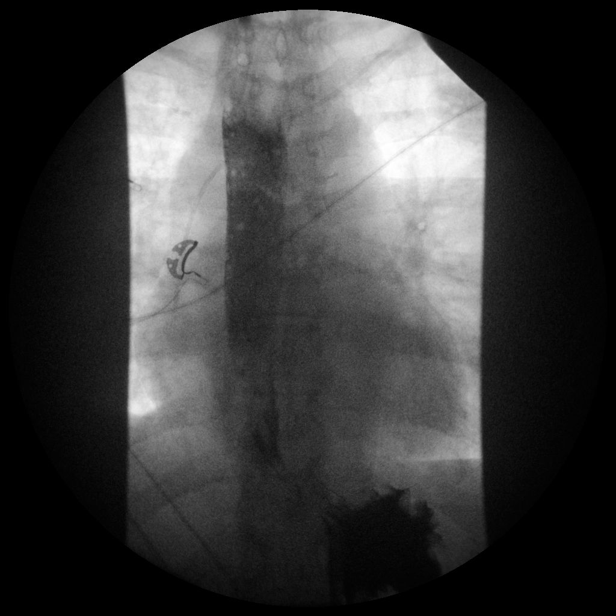

[Series 9: run · 1 of 1 slices shown (7 of 14)]
[im 1/1]
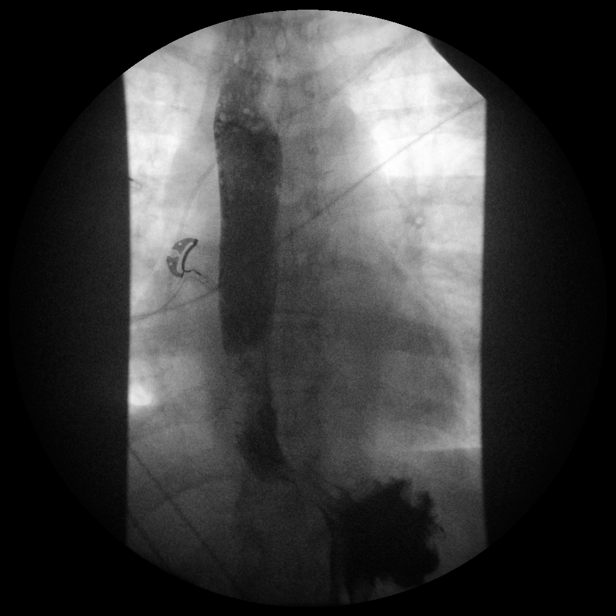

[Series 10: run · 1 of 1 slices shown (8 of 14)]
[im 1/1]
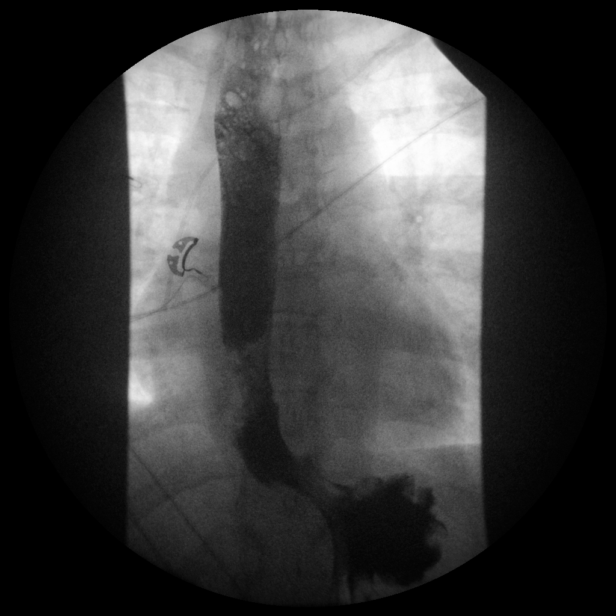

[Series 11: run · 1 of 1 slices shown (9 of 14)]
[im 1/1]
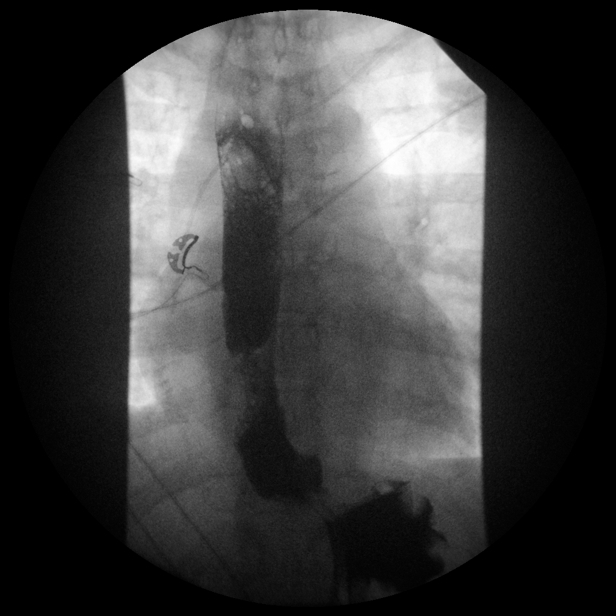

[Series 13: run · 1 of 1 slices shown (10 of 14)]
[im 1/1]
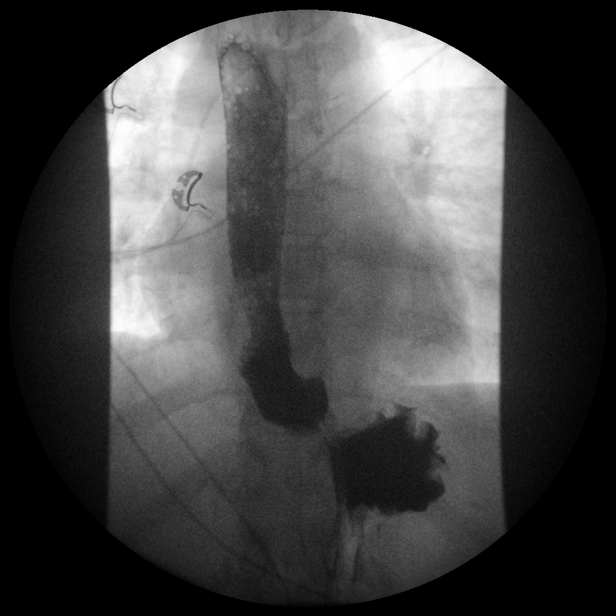

[Series 14: run · 1 of 1 slices shown (11 of 14)]
[im 1/1]
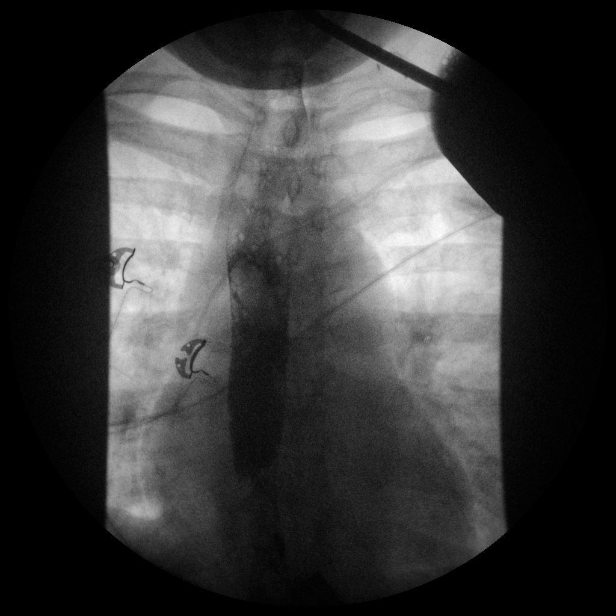

[Series 15: run · 1 of 1 slices shown (12 of 14)]
[im 1/1]
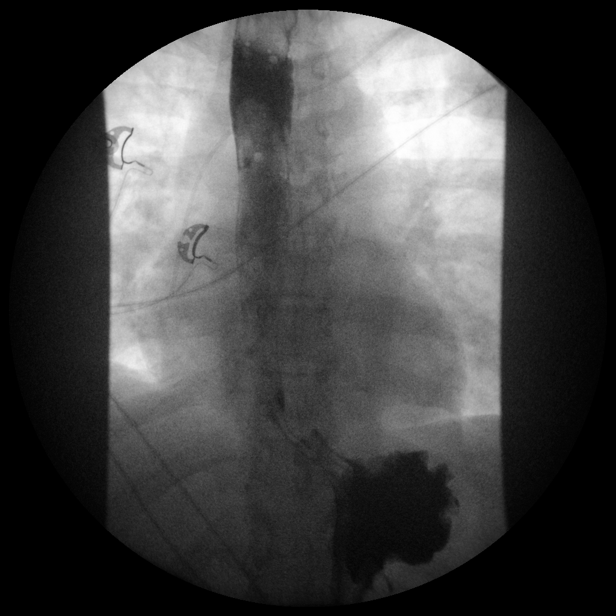

[Series 17: run · 1 of 1 slices shown (13 of 14)]
[im 1/1]
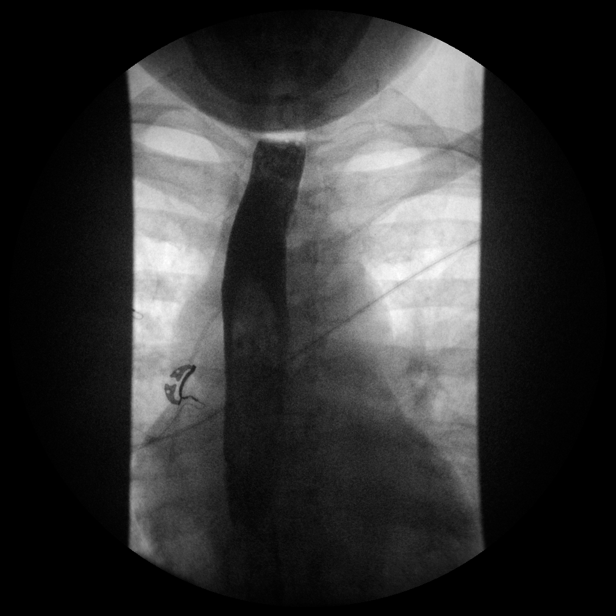

[Series 18: run · 1 of 1 slices shown (14 of 14)]
[im 1/1]
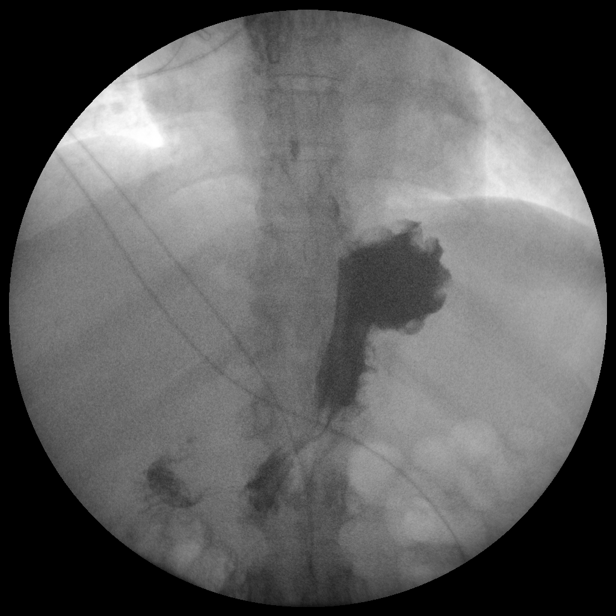

[14 of 18 positions shown; findings below may reference images not displayed]

FINDINGS: The patient tolerated the study well. She drank 15 mL of Omnipaque
300 which demonstrated severe mucosal irregularity in the distal
third of the thoracic esophagus (series 6), but no obstruction to
the flow of contrast through the esophagus and into the stomach, and
no extravasation or leak.

The study was then repeated with thin barium. The severe ulceration
at the distal third of the esophagus persisted. Barium was slower to
pass through the esophagus. Still, barium did reach the stomach with
no extravasation or abnormal accumulation identified.
IMPRESSION: Severe ulceration distal third thoracic esophagus with a degree of
narrowing but no evidence of perforation or leak.

## 2016-07-20 ENCOUNTER — Ambulatory Visit: Payer: Medicare HMO

## 2016-07-20 DIAGNOSIS — I972 Postmastectomy lymphedema syndrome: Secondary | ICD-10-CM

## 2016-07-20 NOTE — Therapy (Signed)
[place], [place], [address] Phone: [phone]   Fax:  [phone]  Physical Therapy Treatment  Patient Details  Name: Marissa Brooks MRN: [id number] Date of Birth: May 03, 1956 Referring Provider: Dr. Truitt Merle  Encounter Date: 07/20/2016      PT End of Session - 07/20/16 1410    Visit Number 5   Number of Visits 19   Date for PT Re-Evaluation 08/26/16   PT Start Time 1257   PT Stop Time 1345   PT Time Calculation (min) 48 min   Activity Tolerance Patient tolerated treatment well   Behavior During Therapy [facility] for tasks assessed/performed      Past Medical History:  Diagnosis Date  . Allergy   . Anxiety   . Arthritis   . Bipolar disorder ([place])   . Breast cancer ([place]) 09/10/14   right breast,hx left breast ca,2002 [place]  . Cancer [facility]) 2002   left breast cancer tx in [place], right breast cancer 08/2014  . Cocaine abuse    last use 10/13/14  . Depression   . ETOH abuse    as of 10/21/14 none for a week  . GERD (gastroesophageal reflux disease)   . HCV (hepatitis C virus)   . Hypertension   . Pneumonia   . Shortness of breath dyspnea    with walking  . Tobacco abuse     Past Surgical History:  Procedure Laterality Date  . BREAST SURGERY  2002   left mastectomy with implant  . ESOPHAGOGASTRODUODENOSCOPY (EGD) WITH PROPOFOL N/A 03/17/2015   Procedure: ESOPHAGOGASTRODUODENOSCOPY (EGD) WITH PROPOFOL;  Surgeon: Gatha Mayer, MD;  [facility];  Service: Endoscopy;  Laterality: N/A;  . MANDIBLE FRACTURE SURGERY    . MASTECTOMY     and Implant  . MASTECTOMY W/ SENTINEL NODE BIOPSY Right 10/30/2014  . SIMPLE MASTECTOMY WITH AXILLARY SENTINEL NODE BIOPSY Right 10/30/2014   Procedure: RIGHT MASTECTOMY WITH SENTINEL NODE MAPPING;  Surgeon: Autumn Messing III, MD;  Location: [place];  Service: General;  Laterality: Right;  . TONSILLECTOMY      There were no vitals filed for  this visit.      Subjective Assessment - 07/20/16 1259    Subjective Took my bandages of an hour ago to get measured for new compression garments. No problems with them, my arm is getting smaller.   Pertinent History Bilateral mastectomies (left 2005 and right 10/30/14) with right SLNB.  Adjuvant chemotherapy 2016 and radiation for right chest in 2017.  Bipolar disorder managed with medication.  Pt. has been seen in this clinic in the past for treatment of right UE lymphedema.   Patient Stated Goals get new daytime garments   Currently in Pain? No/denies               LYMPHEDEMA/ONCOLOGY QUESTIONNAIRE - 07/20/16 1301      Right Upper Extremity Lymphedema   15 cm Proximal to Olecranon Process 33.7 cm   10 cm Proximal to Olecranon Process 31.4 cm   Olecranon Process 26.5 cm   15 cm Proximal to Ulnar Styloid Process 26.8 cm   10 cm Proximal to Ulnar Styloid Process 23.3 cm   Just Proximal to Ulnar Styloid Process 17.6 cm   Across Hand at [employer] 18.6 cm   At [place] of 2nd Digit 5.7 cm                  [facility] Adult PT Treatment/Exercise -  07/20/16 0001      Manual Therapy   Manual Therapy Edema management;Manual Lymphatic Drainage (MLD);Compression Bandaging   Edema Management Circumference measurements taken   Manual Lymphatic Drainage (MLD) In supine, short neck, superficial and deep abdomen, left axilla and anterior interaxillary anastomosis, and right groin and axillo-inguinal anastomosis; then right UE from dorsal hand to axilla.  Then in left sidelying, posterior interaxillary anastomosis and right axillo-inguinal anastomosis.   Compression Bandaging lotion applied, TG soft, Artiflex x 1 with 2 pieces of rectangular shaped peach Medi foam with small dots at anterior and posterior aspects of superior forearm at area of fibrosis, 1-6 cm., 1-8 cm., and 2-10 cm. short stretch bandages from hand to axilla with first 10 cm applied in herring bone fashion.                    Short Term Clinic Goals - 07/15/16 1204      CC Short Term Goal  #1   Title Right UE circumference at 15 cm. proximal to olecranon will reduce to 33 cm. or less   Baseline 34.3 at time of eval on 06/29/16 compared to 30.4 on left, 07/15/16-34.2   Time 3   Period Weeks   Status On-going     CC Short Term Goal  #2   Title Right UE circumference at 15 cm. proximal to ulnar styloid will reduce to 26.5 cm. or less   Baseline 27.8 cm. at time of eval on 06/29/16 compared to 23.6 on left, 07/15/16- 27.1   Time 3   Period Weeks   Status On-going             Long Term Clinic Goals - 07/20/16 1415      CC Long Term Goal  #1   Title Right UE circumference at 15 cm. proximal to olecranon will reduce to 32.5 cm. or less   Baseline 34.3 at time of eval on 06/29/16 compared to 30.4 on left; 33.7 cm- 07/21/15   Status On-going     CC Long Term Goal  #2   Title Right UE circumference at 15 cm. proximal to ulnar styloid will reduce to 26 cm. or less   Baseline 27.8 cm. at time of eval on 06/29/16 compared to 23.6 on left; 26.8 mc-07/21/15   Status On-going     CC Long Term Goal  #3   Title Patient will have been fitted with new daytime compression sleeve and glove   Baseline This was done today-07/21/15   Status Achieved     CC Long Term Goal  #4   Title Patient will improve her lymphedema life impact scale score to </= 20 for improved function   Baseline 32 (47% impairment) at eval on 06/29/16   Status On-going            Plan - 07/20/16 1411    Clinical Impression Statement Pt was measured before session today for new set of compression garment(s). She is to bring her nighttime garment when her daytime arrives for fitter to adjust calibration to help further decrease pts forearm fibrosis. All her circumference mesaurements have reduced since lst time measured. Pt is pleased with her progress and she will benfit from continued therapy until her garments arrive to  help maintatin her reductions.   Rehab Potential Good   PT Frequency 3x / week   PT Duration 6 weeks   PT Treatment/Interventions ADLs/Self Care Home Management;Therapeutic exercise;Patient/family education;Manual techniques;Manual lymph drainage;Compression bandaging;Taping   PT Next  Visit Plan continue complete decongestive therapy   Consulted and Agree with Plan of Care Patient      Patient will benefit from skilled therapeutic intervention in order to improve the following deficits and impairments:  Increased edema, Decreased knowledge of use of DME  Visit Diagnosis: Postmastectomy lymphedema     Problem List Patient Active Problem List   Diagnosis Date Noted  . Lymphedema of upper extremity 08/04/2015  . Shoulder pain, right 08/04/2015  . Radiation dermatitis 08/04/2015  . Peripheral neuropathy due to chemotherapy ([place]) 06/15/2015  . AKI (acute kidney injury) ([place])   . Esophagitis   . Leukocytosis 03/17/2015  . Acute esophagitis 03/17/2015  . HCAP (healthcare-associated pneumonia) 03/17/2015  . Hematemesis with nausea   . Acute blood loss anemia 03/16/2015  . Dysphagia 03/16/2015  . Hypocalcemia 03/16/2015  . GIB (gastrointestinal bleeding) 03/15/2015  . Tobacco abuse 03/15/2015  . ARF (acute renal failure) ([place]) 03/15/2015  . Hypokalemia 03/15/2015  . Depression 03/15/2015  . Hypertension   . GERD (gastroesophageal reflux disease)   . Anxiety   . Acute kidney injury ([place])   . Essential hypertension   . Gastroesophageal reflux disease without esophagitis   . Breast cancer of upper-outer quadrant of right female breast s/p Right MRM 10/30/14 10/30/2014  . Alcohol dependence ([place]) 07/09/2013  . MDD (major depressive disorder) ([place]) 07/09/2013  . Alcohol abuse with alcohol-induced mood disorder ([place]) 07/08/2013  . 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 OBESITY 05/13/2010  . TONGUE DISORDER 01/20/2010  . BACK PAIN, LUMBAR 09/03/2008  . THROMBOCYTOPENIA 06/08/2007  .  HCV (hepatitis C virus) 06/07/2007  . Bipolar disorder ([place]) 06/07/2007  . Substance abuse 06/07/2007  . PTSD 06/07/2007  . CONSTIPATION 06/07/2007  . HEPATIC CIRRHOSIS 06/07/2007  . UNSPECIFIED CONCUSSION 06/07/2007  . NEOPLASM, MALIGNANT, BREAST, HX OF 06/07/2007    Otelia Limes, PTA 07/20/2016, 2:17 PM  [place], [place], [address] Phone: [phone]   Fax:  [phone]  Name: Marissa Brooks MRN: [id number] Date of Birth: 06/03/1955

## 2016-07-22 ENCOUNTER — Encounter: Payer: Self-pay | Admitting: Physical Therapy

## 2016-07-22 ENCOUNTER — Ambulatory Visit: Payer: Medicare HMO | Attending: Hematology | Admitting: Physical Therapy

## 2016-07-22 DIAGNOSIS — I972 Postmastectomy lymphedema syndrome: Secondary | ICD-10-CM | POA: Diagnosis present

## 2016-07-22 NOTE — Therapy (Signed)
[place], [place], [address] Phone: [phone]   Fax:  [phone]  Physical Therapy Treatment  Patient Details  Name: Marissa Brooks MRN: [id number] Date of Birth: [birth year] Referring Provider: Dr. Truitt Merle  Encounter Date: 07/22/2016      PT End of Session - 07/22/16 1153    Visit Number 6   Number of Visits 19   Date for PT Re-Evaluation 08/26/16   PT Start Time 1107   PT Stop Time 1147   PT Time Calculation (min) 40 min   Activity Tolerance Patient tolerated treatment well   Behavior During Therapy [facility] for tasks assessed/performed      Past Medical History:  Diagnosis Date  . Allergy   . Anxiety   . Arthritis   . Bipolar disorder ([place])   . Breast cancer ([place]) 09/10/14   right breast,hx left breast ca,2002 [place]  . Cancer [facility]) 2002   left breast cancer tx in [place], right breast cancer 08/2014  . Cocaine abuse    last use 10/13/14  . Depression   . ETOH abuse    as of 10/21/14 none for a week  . GERD (gastroesophageal reflux disease)   . HCV (hepatitis C virus)   . Hypertension   . Pneumonia   . Shortness of breath dyspnea    with walking  . Tobacco abuse     Past Surgical History:  Procedure Laterality Date  . BREAST SURGERY  2002   left mastectomy with implant  . ESOPHAGOGASTRODUODENOSCOPY (EGD) WITH PROPOFOL N/A 03/17/2015   Procedure: ESOPHAGOGASTRODUODENOSCOPY (EGD) WITH PROPOFOL;  Surgeon: Gatha Mayer, MD;  [facility];  Service: Endoscopy;  Laterality: N/A;  . MANDIBLE FRACTURE SURGERY    . MASTECTOMY     and Implant  . MASTECTOMY W/ SENTINEL NODE BIOPSY Right 10/30/2014  . SIMPLE MASTECTOMY WITH AXILLARY SENTINEL NODE BIOPSY Right 10/30/2014   Procedure: RIGHT MASTECTOMY WITH SENTINEL NODE MAPPING;  Surgeon: Autumn Messing III, MD;  Location: [place];  Service: General;  Laterality: Right;  . TONSILLECTOMY      There were no vitals filed for this  visit.      Subjective Assessment - 07/22/16 1109    Subjective I took one bandage off last night because it was getting tight. I keep getting cramps in my hand.    Pertinent History Bilateral mastectomies (left 2005 and right 10/30/14) with right SLNB.  Adjuvant chemotherapy 2016 and radiation for right chest in 2017.  Bipolar disorder managed with medication.  Pt. has been seen in this clinic in the past for treatment of right UE lymphedema.   Patient Stated Goals get new daytime garments   Currently in Pain? No/denies   Pain Score 0-No pain                         [facility] Adult PT Treatment/Exercise - 07/22/16 0001      Manual Therapy   Manual Therapy Edema management;Manual Lymphatic Drainage (MLD);Compression Bandaging   Manual Lymphatic Drainage (MLD) In supine, short neck, superficial and deep abdomen, left axilla and anterior interaxillary anastomosis, and right groin and axillo-inguinal anastomosis; then right UE from dorsal hand to axilla.     Compression Bandaging lotion applied, TG soft, Artiflex x 1 with 2 pieces of rectangular shaped peach Medi foam with small dots at anterior and posterior aspects of superior forearm at area of fibrosis, 1-6 cm., 1-8 cm., and  1-10 cm. short stretch bandages from hand to axilla  ( pt forgot to bring 2nd 10 cm bandage in)                   Short Term Clinic Goals - 07/15/16 1204      CC Short Term Goal  #1   Title Right UE circumference at 15 cm. proximal to olecranon will reduce to 33 cm. or less   Baseline 34.3 at time of eval on 06/29/16 compared to 30.4 on left, 07/15/16-34.2   Time 3   Period Weeks   Status On-going     CC Short Term Goal  #2   Title Right UE circumference at 15 cm. proximal to ulnar styloid will reduce to 26.5 cm. or less   Baseline 27.8 cm. at time of eval on 06/29/16 compared to 23.6 on left, 07/15/16- 27.1   Time 3   Period Weeks   Status On-going             Long Term Clinic Goals -  07/20/16 1415      CC Long Term Goal  #1   Title Right UE circumference at 15 cm. proximal to olecranon will reduce to 32.5 cm. or less   Baseline 34.3 at time of eval on 06/29/16 compared to 30.4 on left; 33.7 cm- 07/21/15   Status On-going     CC Long Term Goal  #2   Title Right UE circumference at 15 cm. proximal to ulnar styloid will reduce to 26 cm. or less   Baseline 27.8 cm. at time of eval on 06/29/16 compared to 23.6 on left; 26.8 mc-07/21/15   Status On-going     CC Long Term Goal  #3   Title Patient will have been fitted with new daytime compression sleeve and glove   Baseline This was done today-07/21/15   Status Achieved     CC Long Term Goal  #4   Title Patient will improve her lymphedema life impact scale score to </= 20 for improved function   Baseline 32 (47% impairment) at eval on 06/29/16   Status On-going            Plan - 07/22/16 1151    Clinical Impression Statement Patient had to remove one bandage prior to appointment because it was feeling too tight. Re wrapped pt today with only three bandages and will see if her swelling is managed with this. Patient continues to demonstate forearm fibrosis.    Rehab Potential Good   PT Frequency 3x / week   PT Duration 6 weeks   PT Treatment/Interventions ADLs/Self Care Home Management;Therapeutic exercise;Patient/family education;Manual techniques;Manual lymph drainage;Compression bandaging;Taping   PT Next Visit Plan continue complete decongestive therapy, assess how 3 bandages worked vs 4   Consulted and Agree with Plan of Care Patient      Patient will benefit from skilled therapeutic intervention in order to improve the following deficits and impairments:  Increased edema, Decreased knowledge of use of DME  Visit Diagnosis: Postmastectomy lymphedema     Problem List Patient Active Problem List   Diagnosis Date Noted  . Lymphedema of upper extremity 08/04/2015  . Shoulder pain, right 08/04/2015  . Radiation  dermatitis 08/04/2015  . 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 AKI (acute kidney injury) ([place])   . Esophagitis   . Leukocytosis 03/17/2015  . Acute esophagitis 03/17/2015  . HCAP (healthcare-associated pneumonia) 03/17/2015  . Hematemesis with nausea   . Acute blood  loss anemia 03/16/2015  . Dysphagia 03/16/2015  . Hypocalcemia 03/16/2015  . GIB (gastrointestinal bleeding) 03/15/2015  . Tobacco abuse 03/15/2015  . ARF (acute renal failure) ([place]) 03/15/2015  . Hypokalemia 03/15/2015  . Depression 03/15/2015  . Hypertension   . GERD (gastroesophageal reflux disease)   . Anxiety   . Acute kidney injury ([place])   . Essential hypertension   . Gastroesophageal reflux disease without esophagitis   . Breast cancer of upper-outer quadrant of right female breast s/p Right MRM 10/30/14 10/30/2014  . Alcohol dependence ([place]) 07/09/2013  . MDD (major depressive disorder) ([place]) 07/09/2013  . Alcohol abuse with alcohol-induced mood disorder ([place]) 07/08/2013  . 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 OBESITY 05/13/2010  . TONGUE DISORDER 01/20/2010  . BACK PAIN, LUMBAR 09/03/2008  . THROMBOCYTOPENIA 06/08/2007  . HCV (hepatitis C virus) 06/07/2007  . Bipolar disorder ([place]) 06/07/2007  . Substance abuse 06/07/2007  . PTSD 06/07/2007  . CONSTIPATION 06/07/2007  . HEPATIC CIRRHOSIS 06/07/2007  . UNSPECIFIED CONCUSSION 06/07/2007  . NEOPLASM, MALIGNANT, BREAST, HX OF 06/07/2007    Allyson Sabal [facility] 07/22/2016, 11:53 AM  [place], [place], [address] Phone: [phone]   Fax:  [phone]  Name: TYHESHA HOOPER MRN: [id number] Date of Birth: 01-16-1956  Manus Gunning, PT 07/22/16 11:54 AM

## 2016-07-25 ENCOUNTER — Encounter: Payer: Self-pay | Admitting: Physical Therapy

## 2016-07-25 ENCOUNTER — Ambulatory Visit: Payer: Medicare HMO | Admitting: Physical Therapy

## 2016-07-25 DIAGNOSIS — I972 Postmastectomy lymphedema syndrome: Secondary | ICD-10-CM

## 2016-07-25 NOTE — Therapy (Signed)
[place], [place], [address] Phone: [phone]   Fax:  [phone]  Physical Therapy Treatment  Patient Details  Name: Marissa Brooks MRN: [id number] Date of Birth: [birth year] Referring Provider: Dr. Truitt Merle  Encounter Date: 07/25/2016      PT End of Session - 07/25/16 1343    Visit Number 7   Number of Visits 19   Date for PT Re-Evaluation 08/26/16   PT Start Time 1301   PT Stop Time 1342   PT Time Calculation (min) 41 min   Activity Tolerance Patient tolerated treatment well   Behavior During Therapy [facility] for tasks assessed/performed      Past Medical History:  Diagnosis Date  . Allergy   . Anxiety   . Arthritis   . Bipolar disorder ([place])   . Breast cancer ([place]) 09/10/14   right breast,hx left breast ca,2002 [place]  . Cancer [facility]) 2002   left breast cancer tx in [place], right breast cancer 08/2014  . Cocaine abuse    last use 10/13/14  . Depression   . ETOH abuse    as of 10/21/14 none for a week  . GERD (gastroesophageal reflux disease)   . HCV (hepatitis C virus)   . Hypertension   . Pneumonia   . Shortness of breath dyspnea    with walking  . Tobacco abuse     Past Surgical History:  Procedure Laterality Date  . BREAST SURGERY  2002   left mastectomy with implant  . ESOPHAGOGASTRODUODENOSCOPY (EGD) WITH PROPOFOL N/A 03/17/2015   Procedure: ESOPHAGOGASTRODUODENOSCOPY (EGD) WITH PROPOFOL;  Surgeon: Gatha Mayer, MD;  [facility];  Service: Endoscopy;  Laterality: N/A;  . MANDIBLE FRACTURE SURGERY    . MASTECTOMY     and Implant  . MASTECTOMY W/ SENTINEL NODE BIOPSY Right 10/30/2014  . SIMPLE MASTECTOMY WITH AXILLARY SENTINEL NODE BIOPSY Right 10/30/2014   Procedure: RIGHT MASTECTOMY WITH SENTINEL NODE MAPPING;  Surgeon: Autumn Messing III, MD;  Location: [place];  Service: General;  Laterality: Right;  . TONSILLECTOMY      There were no vitals filed for this  visit.      Subjective Assessment - 07/25/16 1302    Subjective I took the bandages off on Sunday. My arm is swelled up some.    Pertinent History Bilateral mastectomies (left 2005 and right 10/30/14) with right SLNB.  Adjuvant chemotherapy 2016 and radiation for right chest in 2017.  Bipolar disorder managed with medication.  Pt. has been seen in this clinic in the past for treatment of right UE lymphedema.   Patient Stated Goals get new daytime garments   Currently in Pain? No/denies   Pain Score 0-No pain                         [facility] Adult PT Treatment/Exercise - 07/25/16 0001      Manual Therapy   Manual Lymphatic Drainage (MLD) In supine, short neck, superficial and deep abdomen, left axilla and anterior interaxillary anastomosis, and right groin and axillo-inguinal anastomosis; then right UE from dorsal hand to axilla.     Compression Bandaging lotion applied, TG soft, Artiflex x 1 with 2 pieces of rectangular shaped peach Medi foam with small dots at anterior and posterior aspects of superior forearm at area of fibrosis, 1-6 cm., 1-8 cm., and 2-10 cm. short stretch bandages from hand to axilla with first 10 cm applied in herring  bone fashion.                   Short Term Clinic Goals - 07/15/16 1204      CC Short Term Goal  #1   Title Right UE circumference at 15 cm. proximal to olecranon will reduce to 33 cm. or less   Baseline 34.3 at time of eval on 06/29/16 compared to 30.4 on left, 07/15/16-34.2   Time 3   Period Weeks   Status On-going     CC Short Term Goal  #2   Title Right UE circumference at 15 cm. proximal to ulnar styloid will reduce to 26.5 cm. or less   Baseline 27.8 cm. at time of eval on 06/29/16 compared to 23.6 on left, 07/15/16- 27.1   Time 3   Period Weeks   Status On-going             Long Term Clinic Goals - 07/20/16 1415      CC Long Term Goal  #1   Title Right UE circumference at 15 cm. proximal to olecranon will reduce  to 32.5 cm. or less   Baseline 34.3 at time of eval on 06/29/16 compared to 30.4 on left; 33.7 cm- 07/21/15   Status On-going     CC Long Term Goal  #2   Title Right UE circumference at 15 cm. proximal to ulnar styloid will reduce to 26 cm. or less   Baseline 27.8 cm. at time of eval on 06/29/16 compared to 23.6 on left; 26.8 mc-07/21/15   Status On-going     CC Long Term Goal  #3   Title Patient will have been fitted with new daytime compression sleeve and glove   Baseline This was done today-07/21/15   Status Achieved     CC Long Term Goal  #4   Title Patient will improve her lymphedema life impact scale score to </= 20 for improved function   Baseline 32 (47% impairment) at eval on 06/29/16   Status On-going            Plan - 07/25/16 1343    Clinical Impression Statement Patient states when she removed her bandages she felt she had increased swelling. She had forgotten her fourth bandage at last session so only three bandages were used. Today the fourth bandage was added. Pt is awaiting arrival of her compression garments from DME supplier.    Rehab Potential Good   PT Frequency 3x / week   PT Duration 6 weeks   PT Treatment/Interventions ADLs/Self Care Home Management;Therapeutic exercise;Patient/family education;Manual techniques;Manual lymph drainage;Compression bandaging;Taping   PT Next Visit Plan continue complete decongestive therapy, remeasure   Consulted and Agree with Plan of Care Patient      Patient will benefit from skilled therapeutic intervention in order to improve the following deficits and impairments:  Increased edema, Decreased knowledge of use of DME  Visit Diagnosis: Postmastectomy lymphedema     Problem List Patient Active Problem List   Diagnosis Date Noted  . Lymphedema of upper extremity 08/04/2015  . Shoulder pain, right 08/04/2015  . Radiation dermatitis 08/04/2015  . 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 AKI (acute  kidney injury) ([place])   . Esophagitis   . Leukocytosis 03/17/2015  . Acute esophagitis 03/17/2015  . HCAP (healthcare-associated pneumonia) 03/17/2015  . Hematemesis with nausea   . Acute blood loss anemia 03/16/2015  . Dysphagia 03/16/2015  . Hypocalcemia 03/16/2015  . GIB (gastrointestinal bleeding) 03/15/2015  .  Tobacco abuse 03/15/2015  . ARF (acute renal failure) ([place]) 03/15/2015  . Hypokalemia 03/15/2015  . Depression 03/15/2015  . Hypertension   . GERD (gastroesophageal reflux disease)   . Anxiety   . Acute kidney injury ([place])   . Essential hypertension   . Gastroesophageal reflux disease without esophagitis   . Breast cancer of upper-outer quadrant of right female breast s/p Right MRM 10/30/14 10/30/2014  . Alcohol dependence ([place]) 07/09/2013  . MDD (major depressive disorder) ([place]) 07/09/2013  . Alcohol abuse with alcohol-induced mood disorder ([place]) 07/08/2013  . 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 OBESITY 05/13/2010  . TONGUE DISORDER 01/20/2010  . BACK PAIN, LUMBAR 09/03/2008  . THROMBOCYTOPENIA 06/08/2007  . HCV (hepatitis C virus) 06/07/2007  . Bipolar disorder ([place]) 06/07/2007  . Substance abuse 06/07/2007  . PTSD 06/07/2007  . CONSTIPATION 06/07/2007  . HEPATIC CIRRHOSIS 06/07/2007  . UNSPECIFIED CONCUSSION 06/07/2007  . NEOPLASM, MALIGNANT, BREAST, HX OF 06/07/2007    Allyson Sabal [facility] 07/25/2016, 1:45 PM  [place], [place], [address] Phone: [phone]   Fax:  [phone]  Name: INETTA FRANSSEN MRN: [id number] Date of Birth: 12/05/1955  Manus Gunning, PT 07/25/16 1:46 PM

## 2016-07-25 NOTE — Progress Notes (Signed)
[place]  [phone] [phone]) [phone]  Clinic Follow Up Note   Patient Care Team: Elwyn Reach, MD as PCP - General (Internal Medicine) Autumn Messing III, MD as Consulting Physician (General Surgery) Truitt Merle, MD as Consulting Physician (Hematology) Kyung Rudd, MD as Consulting Physician (Radiation Oncology) Sylvan Cheese, NP as Nurse Practitioner (Hematology and Oncology) 08/02/2016  CHIEF COMPLAINTS:  Follow-up of breast cancer  Oncology History   Breast cancer of upper-outer quadrant of right female breast s/p Right MRM 10/30/14   Staging form: Breast, AJCC 7th Edition     Pathologic: Stage IIA (T1b, N1a, cM0) - Unsigned       Breast cancer of upper-outer quadrant of right female breast s/p Right MRM 10/30/14   06/03/2004 Cancer Diagnosis    Left breast DCIS, status post mastectomy      06/06/2014 Mammogram    Mammogram and ultrasound showed an irregular 0.7 cm mass at the 12:30 clock position of right breast. Axillary nodes were negative.      08/12/2014 Initial Biopsy    Right breast 12:30 o'clock biopsy showed invasive ductal carcinoma, grade 2. ER+ (100%), PR+ (88%), HER2/neu negative, Ki67 19%      08/12/2014 Clinical Stage    Stage IA: T1b N0      10/30/2014 Surgery    Right breast mastectomy and axillary node dissection, surgical margins were negative.       10/30/2014 Pathology Results    Right breast radical mastectomy showed invasive ductal carcinoma, grade 1, tumor measuring 1.0 cm, margins were negative lymphovascular invasion (+), 1 sentinel lymph nodes positive, 15 additional axillary nodes negative.  (+) DCIS       10/30/2014 Pathologic Stage    Stage IIA: pT1b pN1a      12/30/2014 - 03/03/2015 Chemotherapy    Docetaxel 75 mg/m, Cytoxan 600 mg/m, every 3 weeks, s/p 4 cycles       03/15/2015 - 03/22/2015 Hospital Admission    Patient was admitted for GI bleeding, required blood transfusion, EDC showed  esophagitis.      06/29/2015 - 08/07/2015 Radiation Therapy    Right chest wall 48 Gy in 24 fractions, right chest wall 1.8 Gy in 1 fractions, Right chest wall scar boost treated to 10 Gy in 5 fractions.      10/21/2015 Survivorship    Survivorship care plan mailed to patient      06/07/2016 -  Anti-estrogen oral therapy    Letrozole 2.5 mg daily, started January 2018.        HISTORY OF PRESENTING ILLNESS:  Marissa Brooks 61 y.o. female is here because of recently diagnosed right breast cancer.   This was discovered by screening mammogram in January 2016. She did not have palpable breast mass or any other symptoms. Biopsy on 08/12/2014 showed invasive ductal carcinoma, ER/PR positive, HER-2 negative. She underwent right breast mastectomy by Dr. Marcello Moores on 10/28/2014. Marland Kitchen   She has had some pain underneath her R armpit with some swelling. She reports that her "drain fell out" and was seen by Dr. Marlou Starks on 7/15 at which time a seroma was drained. Otherwise, she denies any problems with appetite, energy, pain. She does report having lost weight 195 lbs to 186 lbs though over an unspecified time interval. She is now menopausal with last menstrual period being sometime in her late 84s.  Per records obtained from [facility], she underwent left simple mastectomy on at [facility] on 07/16/04 after detection of multiple abnormalities in  the left breast on mammography and was diagnosed with DCIS. On 05/27/05, she underwent placement of left tissue expander but declined additional reconstructive surgery. On 06/15/06, she was seen by Dr. Sophronia Simas for possible adjuvant tamoxifen and was recommended to undergo close surveillance. 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   She follows with Dr. Gala Romney for PCP care and was last seen by them prior to the surgery. She is currently undergoing substance abuse rehab. She reports that 1 pack of cigarettes  lasts her a week and has not used cocaine for a month ago. She does report using heroin in the past. She also has history of bipolar for which she is on Abilify. She is widowed, has no children. She lives with a roommate who she has known for the last 10 years.  CURRENT THERAPY: Letrozole 2.5 mg daily, started January 2018.  INTERIM HISTORY Marissa Brooks returns for follow up. She is doing well today. She reports being compliant with Letrozole. She denies joint paint or hot flashes with Letrozole. The patient denies pain at this time, but continues to use Tramadol. She uses Trazadone to help her sleep at times. She continues to use marijuana and cocaine, but wishes to stop soon.  MEDICAL HISTORY:  Past Medical History:  Diagnosis Date  . Allergy   . Anxiety   . Arthritis   . Bipolar disorder ([place])   . Breast cancer ([place]) 09/10/14   right breast,hx left breast ca,2002 [place]  . Cancer [facility]) 2002   left breast cancer tx in [place], right breast cancer 08/2014  . Cocaine abuse    last use 10/13/14  . Depression   . ETOH abuse    as of 10/21/14 none for a week  . GERD (gastroesophageal reflux disease)   . HCV (hepatitis C virus)   . Hypertension   . Pneumonia   . Shortness of breath dyspnea    with walking  . Tobacco abuse     SURGICAL HISTORY: Past Surgical History:  Procedure Laterality Date  . BREAST SURGERY  2002   left mastectomy with implant  . ESOPHAGOGASTRODUODENOSCOPY (EGD) WITH PROPOFOL N/A 03/17/2015   Procedure: ESOPHAGOGASTRODUODENOSCOPY (EGD) WITH PROPOFOL;  Surgeon: Gatha Mayer, MD;  Location: [facility];  Service: Endoscopy;  Laterality: N/A;  . MANDIBLE FRACTURE SURGERY    . MASTECTOMY     and Implant  . MASTECTOMY W/ SENTINEL NODE BIOPSY Right 10/30/2014  . SIMPLE MASTECTOMY WITH AXILLARY SENTINEL NODE BIOPSY Right 10/30/2014   Procedure: RIGHT MASTECTOMY WITH SENTINEL NODE MAPPING;  Surgeon: Autumn Messing III, MD;  Location: [place];  Service: General;   Laterality: Right;  . TONSILLECTOMY      SOCIAL HISTORY: Social History   Social History  . Marital status: Widowed    Spouse name: N/A  . Number of children: N/A  . Years of education: N/A   Occupational History  . Not on file.   Social History Main Topics  . Smoking status: Current Every Day Smoker    Packs/day: 0.25    Types: Cigarettes  . Smokeless tobacco: Never Used  . Alcohol use Yes     Comment: 3-4 40 oz/day   . Drug use: Yes    Types: Cocaine, Marijuana     Comment: last use of crack this morning   . Sexual activity: Not Currently    Birth control/ protection: Abstinence   Other Topics Concern  . Not on file  Social History Narrative  . No narrative on file    FAMILY HISTORY: Family History  Problem Relation Age of Onset  . Mental illness Mother   . Breast cancer Sister 71  . HIV/AIDS Sister     Deceased    ALLERGIES:  is allergic to centrum.  MEDICATIONS:  Current Outpatient Prescriptions  Medication Sig Dispense Refill  . ARIPiprazole (ABILIFY) 5 MG tablet Take 1 tablet (5 mg total) by mouth daily. For mood control 30 tablet 0  . letrozole (FEMARA) 2.5 MG tablet Take 1 tablet (2.5 mg total) by mouth daily. 30 tablet 3  . lisinopril (PRINIVIL,ZESTRIL) 10 MG tablet Take 10 mg by mouth daily.    . sertraline (ZOLOFT) 100 MG tablet Take 1 tablet (100 mg total) by mouth at bedtime. For depression 30 tablet 0  . traZODone (DESYREL) 100 MG tablet Take 1 tablet (100 mg total) by mouth at bedtime. For sleep 30 tablet 0   No current facility-administered medications for this visit.     REVIEW OF SYSTEMS:   Constitutional: Denies fevers, chills or abnormal night sweats Eyes: Denies blurriness of vision, double vision or watery eyes Ears, nose, mouth, throat, and face: Denies mucositis or sore throat Respiratory: Denies cough, dyspnea or wheezes Cardiovascular: Denies palpitation, chest discomfort or lower extremity swelling Gastrointestinal:  Denies  nausea, heartburn or change in bowel habits Skin: Denies abnormal skin rashes Lymphatics: Denies new lymphadenopathy or easy bruising (+) R upper extremity lymphedema Neurological:Denies numbness, tingling or new weaknesses Behavioral/Psych: Mood is stable, no new changes  All other systems were reviewed with the patient and are negative.  PHYSICAL EXAMINATION: ECOG PERFORMANCE STATUS: 0 - Asymptomatic  Vitals:   08/02/16 1031  BP: 130/61  Pulse: (!) 53  Resp: 18  Temp: 98.7 F (37.1 C)   Filed Weights   08/02/16 1031  Weight: 178 lb 6.4 oz (80.9 kg)   GENERAL:alert, no distress and comfortable SKIN: skin color, texture, turgor are normal, no rashes or significant lesions, except multiple large healing skim rash/ulcers and pigmentation/color change on b/l front arms, L>R. EYES: normal, conjunctiva are pink and non-injected, sclera clear OROPHARYNX:no exudate, no erythema and lips, buccal mucosa, and tongue normal  NECK: supple, thyroid normal size, non-tender, without nodularity LYMPH:  no palpable lymphadenopathy in the cervical, axillary or inguinal BREAST: s/p bilateral mastectomy, s/p left implant placement. exam of the left breast, right chest wall and bilateral axilla revealed no palpable mass or adenopathy., (+) bilateral mastectomy, left silicone implant, no lymphadenopathy, otherwise negative. LUNGS: clear to auscultation and percussion with normal breathing effort HEART: regular rate & rhythm and no murmurs and no lower extremity edema ABDOMEN:abdomen soft, non-tender and normal bowel sounds Musculoskeletal:no cyanosis of digits and no clubbing  PSYCH: alert & oriented x 3 NEURO: no focal motor/sensory deficits Ext: Moderate right arm lymphedema involving the forearm and hand, right shoulder mobility is normal.  LABORATORY DATA:  I have reviewed the data as listed CBC Latest Ref Rng & Units 08/02/2016 06/07/2016 08/26/2015  WBC 3.9 - 10.3 10e3/uL 4.6 5.7 5.0  Hemoglobin  11.6 - 15.9 g/dL 13.5 12.2 11.7(L)  Hematocrit 34.8 - 46.6 % 40.1 37.0 34.4(L)  Platelets 145 - 400 10e3/uL 135(L) 129(L) 180    CMP Latest Ref Rng & Units 08/02/2016 06/07/2016 08/26/2015  Glucose 70 - 140 mg/dl 85 88 101(H)  BUN 7.0 - 26.0 mg/dL 13.3 19.3 18  Creatinine 0.6 - 1.1 mg/dL 1.1 1.1 0.94  Sodium 136 - 145 mEq/L 139  143 144  Potassium 3.5 - 5.1 mEq/L 3.8 4.9 3.8  Chloride 101 - 111 mmol/L - - 114(H)  CO2 22 - 29 mEq/L 26 24 20(L)  Calcium 8.4 - 10.4 mg/dL 9.4 9.5 9.4  Total Protein 6.4 - 8.3 g/dL 8.0 7.1 7.7  Total Bilirubin 0.20 - 1.20 mg/dL 0.48 0.28 0.4  Alkaline Phos 40 - 150 U/L 90 77 82  AST 5 - 34 U/L 35(H) 28 72(H)  ALT 0 - 55 U/L 37 33 62(H)    PATHOLOGY REPORT 10/30/2014 ADDITIONAL INFORMATION: 2. FLUORESCENCE IN-SITU HYBRIDIZATION  Results: HER2 - NEGATIVE RATIO OF HER2/CEP17 SIGNALS 1.62 AVERAGE HER2 COPY NUMBER PER CELL 2.10 Reference Range: NEGATIVE HER2/CEP17 Ratio <2.0 and average HER2 copy number <4.0 EQUIVOCAL HER2/CEP17 Ratio <2.0 and average HER2 copy number 4.0 and <6.0 POSITIVE HER2/CEP17 Ratio >=2.0 or <2.0 and average HER2 copy number >=6.0 Marissa RUND DO Pathologist, Electronic Signature ( Signed 11/26/2014) 2. her2 This is NOT signed out FINAL DIAGNOSIS Diagnosis 1. Lymph node, sentinel, biopsy, Right #1 - ONE LYMPH NODE, POSITIVE FOR METASTATIC MAMMARY CARCINOMA (1/1). - INTRANODAL TUMOR DEPOSIT IS 1.2 CM - POSITIVE FOR EXTRACAPSULAR TUMOR EXTENSION. 2. Breast, radical mastectomy (including lymph nodes), Right and axillary contents - INVASIVE DUCTAL CARCINOMA, SEE COMMENT. - POSITIVE FOR LYMPH VASCULAR INVASION. - DUCTAL CARCINOMA IN SITU WITH NECROSIS AND CALCIFICATIONS. - FIFTEEN LYMPH NODE S, NEGATIVE FOR TUMOR (0/15). - PREVIOUS BIOPSY SITE. - SEE TUMOR SYNOPTIC TEMPLATE BELOW  Microscopic Comment 2. BREAST, INVASIVE TUMOR, WITH LYMPH NODES PRESENT Specimen, including laterality and lymph node sampling (sentinel, non-sentinel):  Right breast with sentinel lymph node sampling Procedure: Radical mastectomy Histologic type: Ductal Grade: I of III Tubule formation: 2 Nuclear pleomorphism: 2 Mitotic: 1 Tumor size (gross measurement): 1.0 cm Margins: Invasive, distance to closest margin: 1.8 cm (posterior) In-situ, distance to closest margin: 1.8 cm (posterior) If margin positive, focally or broadly: N/A Lymphovascular invasion: Present Ductal carcinoma in situ: Present Grade: 2 of 3 Extensive intraductal component: Absent Lobular neoplasia: Absent Tumor focality: Unifocal Treatment effect: None If present, treatment effect in breast tissue, lymph nodes or both: N/A Extent of tumor: Skin: N/A Nipple: N/A Skeletal muscle: N/A Lymph nodes: Examined: 1 Sentinel 15 Non-sentinel 16 Total Lymph nodes with metastasis: 1 Isolated tumor cells (< 0.2 mm): 0 Micrometastasis: (> 0.2 mm and < 2.0 mm): 0 Macrometastasis: (> 2.0 mm): x 1 Extracapsular extension: Present Breast prognostic profile: Estrogen receptor: Not repeated, previous study demonstrated 100% positivity ([id number]) Progesterone receptor: Not repeated, previous study demonstrated 88% positivity ([id number]) Her 2 neu: Repeated, previous study demonstrated no amplification ([id number]) Ki-67: Not repeated, previous study demonstrated 19% proliferation rate ([id number]) Non-neoplastic breast: Previous biopsy site, fibrocystic change, columnar cell change, sclerosing adenosis, benign adenosis and calcifications TNM: p T1b, pN1a, pMX   RADIOGRAPHIC STUDIES: I have personally reviewed the radiological images as listed and agreed with the findings in the report. No results found.  ASSESSMENT & PLAN:  Ms. Lemberger is a 61 y.o. female with h/o left DCIS s/p mastectomy who presents with R invasive ductal carcinoma s/p mastectomy  1. Stage IIa, pT1bpN1a,M0, right invasive ductal carcinoma s/p mastectomy, ER+/PR+/HER- , G1 -Discussed with the patient the  results of her surgical path and her cancer staging -The natural history of breast cancer were reviewed with her, and moderate risk of cancer recurrence after her surgery, giving the note positive disease. -She has now completed adjuvant chemotherapy, and adjuvant radiation. -She did not get a breast reconstruction, but uses a  breast prosthesis instead.  -I have reviewed the labs, CBC and CMP are WNL except mild thrombocytopenia  -she has started adjuvant letrozole, but it was delayed for 10 months after she completed RT, due to her non-compliance and lose of f/u.   -She is tolerating letrozole well, we will continue, plan for 5-7 years. -Continue breast cancer surveillance. She has has a bilateral mastectomy, no need for routine screening mammogram. I encouraged her to self exam, and a follow-up with [place] routinely with lab and exam. -She is clinically doing well, labs and exam are unremarkable today, no concern for for breast Cancer recurrence.  2. Genetics -Giving her asthma history of breast cancer twice, and family history (sister) of breast cancer, I recommended genetic counseling but she declined.   -She does not have children, but does have sisters.  3. History of cirrhosis and HCV infection -No imaging or additional workup found in our system though noted to have undergone treatment for HCV in 1999 at [facility] per note by Aurora Mask  [06/02/09] -Most recent LFTs reassuring -She still has very high HCV load, will refer her to liver clinic  4. Polysubstance abuse:  -Recently hospitalized in February 2016 for substance-induced mood disorder. -Continue to encourage patient to continue with substance rehab and avoid illicit substances -She agrees to quit alcohol and illicit drug (cocaine) completely when she is on chemo, she will working on smoking cessation also -Her urine test was still positive for cocaine on August 9. -The patient denies pain, therefore I will no longer prescribe  Tramadol.  5. Hypertension:  -Continue follow-up with her primary care physician. -Her BP has been slightly elevated  6. Mood disorder:  -She does not follow with a psychiatrist and reports her PCP prescribes all of her psychiatric medications.  -Defer to PCP  7. Right arm lymph edema  -continue PT exercises at home -wears compression sleeve.   Plan  -Continue letrozole. -I encouraged her to stop her marijuana and cocaine usage.  -She will return for labs and follow up in 4 months.   Truitt Merle  08/02/16   This document serves as a record of services personally performed by Truitt Merle, MD. It was created on her behalf by Darcus Austin, a trained medical scribe. The creation of this record is based on the scribe's personal observations and the provider's statements to them. This document has been checked and approved by the attending provider.

## 2016-07-27 ENCOUNTER — Ambulatory Visit: Payer: Medicare HMO | Admitting: Physical Therapy

## 2016-07-27 ENCOUNTER — Encounter: Payer: Self-pay | Admitting: Physical Therapy

## 2016-07-27 ENCOUNTER — Encounter: Payer: Self-pay | Admitting: Gastroenterology

## 2016-07-27 DIAGNOSIS — I972 Postmastectomy lymphedema syndrome: Secondary | ICD-10-CM

## 2016-07-27 NOTE — Therapy (Addendum)
[place], [place], [address] Phone: [phone]   Fax:  [phone]  Physical Therapy Treatment  Patient Details  Name: Marissa Brooks MRN: [id number] Date of Birth: Mar 05, 1956 Referring Provider: Dr. Truitt Merle  Encounter Date: 07/27/2016      PT End of Session - 07/27/16 1345    Visit Number 8   Number of Visits 19   Date for PT Re-Evaluation 08/26/16   PT Start Time 1301   PT Stop Time 1341   PT Time Calculation (min) 40 min   Activity Tolerance Patient tolerated treatment well   Behavior During Therapy [facility] for tasks assessed/performed      Past Medical History:  Diagnosis Date  . Allergy   . Anxiety   . Arthritis   . Bipolar disorder ([place])   . Breast cancer ([place]) 09/10/14   right breast,hx left breast ca,2002 [place]  . Cancer [facility]) 2002   left breast cancer tx in [place], right breast cancer 08/2014  . Cocaine abuse    last use 10/13/14  . Depression   . ETOH abuse    as of 10/21/14 none for a week  . GERD (gastroesophageal reflux disease)   . HCV (hepatitis C virus)   . Hypertension   . Pneumonia   . Shortness of breath dyspnea    with walking  . Tobacco abuse     Past Surgical History:  Procedure Laterality Date  . BREAST SURGERY  2002   left mastectomy with implant  . ESOPHAGOGASTRODUODENOSCOPY (EGD) WITH PROPOFOL N/A 03/17/2015   Procedure: ESOPHAGOGASTRODUODENOSCOPY (EGD) WITH PROPOFOL;  Surgeon: Gatha Mayer, MD;  [facility];  Service: Endoscopy;  Laterality: N/A;  . MANDIBLE FRACTURE SURGERY    . MASTECTOMY     and Implant  . MASTECTOMY W/ SENTINEL NODE BIOPSY Right 10/30/2014  . SIMPLE MASTECTOMY WITH AXILLARY SENTINEL NODE BIOPSY Right 10/30/2014   Procedure: RIGHT MASTECTOMY WITH SENTINEL NODE MAPPING;  Surgeon: Autumn Messing III, MD;  Location: [facility];  Service: General;  Laterality: Right;  . TONSILLECTOMY      There were no vitals filed for this  visit.      Subjective Assessment - 07/27/16 1304    Subjective I had to take the last bandage off last night because it felt too tight. It is a little more swollen today. I am hoping the sleeve helps.    Pertinent History Bilateral mastectomies (left 2005 and right 10/30/14) with right SLNB.  Adjuvant chemotherapy 2016 and radiation for right chest in 2017.  Bipolar disorder managed with medication.  Pt. has been seen in this clinic in the past for treatment of right UE lymphedema.   Patient Stated Goals get new daytime garments   Currently in Pain? No/denies   Pain Score 0-No pain               LYMPHEDEMA/ONCOLOGY QUESTIONNAIRE - 07/27/16 1305      Right Upper Extremity Lymphedema   15 cm Proximal to Olecranon Process 34 cm   10 cm Proximal to Olecranon Process 30.6 cm   Olecranon Process 26 cm   15 cm Proximal to Ulnar Styloid Process 27 cm   10 cm Proximal to Ulnar Styloid Process 23 cm   Just Proximal to Ulnar Styloid Process 16.8 cm   Across Hand at [employer] 18.5 cm   At [place] of 2nd Digit 5.8 cm  [facility] Adult PT Treatment/Exercise - 07/27/16 0001      Manual Therapy   Manual Lymphatic Drainage (MLD) In supine, short neck, superficial and deep abdomen, left axilla and anterior interaxillary anastomosis, and right groin and axillo-inguinal anastomosis; then right UE from dorsal hand to axilla.     Compression Bandaging lotion applied, TG soft, Artiflex x 1 with 2 pieces of rectangular shaped peach Medi foam with small dots at anterior and posterior aspects of superior forearm at area of fibrosis, 1-6 cm., 1-8 cm., and 2-10 cm. short stretch bandages from hand to axilla with first 10 cm applied in herring bone fashion.                   Short Term Clinic Goals - 07/27/16 1344      CC Short Term Goal  #1   Title Right UE circumference at 15 cm. proximal to olecranon will reduce to 33 cm. or less   Baseline 34.3 at time of eval on  06/29/16 compared to 30.4 on left, 07/15/16-34.2, 07/27/16-34 cm   Time 3   Period Weeks   Status On-going     CC Short Term Goal  #2   Title Right UE circumference at 15 cm. proximal to ulnar styloid will reduce to 26.5 cm. or less   Baseline 27.8 cm. at time of eval on 06/29/16 compared to 23.6 on left, 07/15/16- 27.1, 07/27/16- 27 cm   Time 3   Period Weeks   Status On-going     CC Short Term Goal  #3   Title --   Baseline 24.2, 07/31/15- 22.1 cm, 23.2 09/07/15 (pt has been out for 2 weeks),24 cm on 09/25/2015, 23.7 cm 09/28/15             Long Term Clinic Goals - 07/20/16 1415      CC Long Term Goal  #1   Title Right UE circumference at 15 cm. proximal to olecranon will reduce to 32.5 cm. or less   Baseline 34.3 at time of eval on 06/29/16 compared to 30.4 on left; 33.7 cm- 07/21/15   Status On-going     CC Long Term Goal  #2   Title Right UE circumference at 15 cm. proximal to ulnar styloid will reduce to 26 cm. or less   Baseline 27.8 cm. at time of eval on 06/29/16 compared to 23.6 on left; 26.8 mc-07/21/15   Status On-going     CC Long Term Goal  #3   Title Patient will have been fitted with new daytime compression sleeve and glove   Baseline This was done today-07/21/15   Status Achieved     CC Long Term Goal  #4   Title Patient will improve her lymphedema life impact scale score to </= 20 for improved function   Baseline 32 (47% impairment) at eval on 06/29/16   Status On-going            Plan - 07/27/16 1345    Clinical Impression Statement Pt demonstrates little change in circumferential measurements compared to last week. She has reached maximal reduction and is awaiting arrival of her compression sleeve for long term managemnet. She is progressing towards her short term goals in therapy.    Rehab Potential Good   PT Frequency 3x / week   PT Duration 6 weeks   PT Treatment/Interventions ADLs/Self Care Home Management;Therapeutic exercise;Patient/family education;Manual  techniques;Manual lymph drainage;Compression bandaging;Taping   PT Next Visit Plan continue complete decongestive therapy  PT Home Exercise Plan Pt. was given a copy today of remedial UE lymphedema exercises.   Consulted and Agree with Plan of Care Patient      Patient will benefit from skilled therapeutic intervention in order to improve the following deficits and impairments:  Increased edema, Decreased knowledge of use of DME  Visit Diagnosis: Postmastectomy lymphedema     Problem List Patient Active Problem List   Diagnosis Date Noted  . Lymphedema of upper extremity 08/04/2015  . Shoulder pain, right 08/04/2015  . Radiation dermatitis 08/04/2015  . Peripheral neuropathy due to chemotherapy ([place]) 06/15/2015  . AKI (acute kidney injury) ([place])   . Esophagitis   . Leukocytosis 03/17/2015  . Acute esophagitis 03/17/2015  . HCAP (healthcare-associated pneumonia) 03/17/2015  . Hematemesis with nausea   . Acute blood loss anemia 03/16/2015  . Dysphagia 03/16/2015  . Hypocalcemia 03/16/2015  . GIB (gastrointestinal bleeding) 03/15/2015  . Tobacco abuse 03/15/2015  . ARF (acute renal failure) ([place]) 03/15/2015  . Hypokalemia 03/15/2015  . Depression 03/15/2015  . Hypertension   . GERD (gastroesophageal reflux disease)   . Anxiety   . Acute kidney injury ([place])   . Essential hypertension   . Gastroesophageal reflux disease without esophagitis   . Breast cancer of upper-outer quadrant of right female breast s/p Right MRM 10/30/14 10/30/2014  . Alcohol dependence ([place]) 07/09/2013  . MDD (major depressive disorder) ([place]) 07/09/2013  . Alcohol abuse with alcohol-induced mood disorder ([place]) 07/08/2013  . 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 OBESITY 05/13/2010  . TONGUE DISORDER 01/20/2010  . BACK PAIN, LUMBAR 09/03/2008  . THROMBOCYTOPENIA 06/08/2007  . HCV (hepatitis C virus) 06/07/2007  . Bipolar disorder ([place]) 06/07/2007  . Substance abuse 06/07/2007  . PTSD 06/07/2007  .  CONSTIPATION 06/07/2007  . HEPATIC CIRRHOSIS 06/07/2007  . UNSPECIFIED CONCUSSION 06/07/2007  . NEOPLASM, MALIGNANT, BREAST, HX OF 06/07/2007    Allyson Sabal [facility] 07/27/2016, 1:47 PM  [place], [place], [address] Phone: [phone]   Fax:  [phone]  Name: EILYN POLACK MRN: [id number] Date of Birth: 1955/11/11  Manus Gunning, PT 07/27/16 1:47 PM  PHYSICAL THERAPY DISCHARGE SUMMARY  Visits from Start of Care: 8  Current functional level related to goals / functional outcomes: See above   Remaining deficits: See above   Education / Equipment: See above Plan: Patient agrees to discharge.  Patient goals were partially met. Patient is being discharged due to not returning since the last visit.  ?????     [employer], [place] 06/19/17 9:30 AM

## 2016-07-29 ENCOUNTER — Ambulatory Visit: Payer: Medicare HMO | Admitting: Physical Therapy

## 2016-08-01 ENCOUNTER — Ambulatory Visit: Payer: Medicare HMO | Admitting: Physical Therapy

## 2016-08-02 ENCOUNTER — Other Ambulatory Visit (HOSPITAL_BASED_OUTPATIENT_CLINIC_OR_DEPARTMENT_OTHER): Payer: Medicare HMO

## 2016-08-02 ENCOUNTER — Encounter: Payer: Self-pay | Admitting: Hematology

## 2016-08-02 ENCOUNTER — Ambulatory Visit (HOSPITAL_BASED_OUTPATIENT_CLINIC_OR_DEPARTMENT_OTHER): Payer: Medicare HMO | Admitting: Hematology

## 2016-08-02 ENCOUNTER — Telehealth: Payer: Self-pay | Admitting: Hematology

## 2016-08-02 VITALS — BP 130/61 | HR 53 | Temp 98.7°F | Resp 18 | Ht 62.0 in | Wt 178.4 lb

## 2016-08-02 DIAGNOSIS — T451X5A Adverse effect of antineoplastic and immunosuppressive drugs, initial encounter: Secondary | ICD-10-CM

## 2016-08-02 DIAGNOSIS — K7460 Unspecified cirrhosis of liver: Secondary | ICD-10-CM | POA: Diagnosis not present

## 2016-08-02 DIAGNOSIS — B182 Chronic viral hepatitis C: Secondary | ICD-10-CM

## 2016-08-02 DIAGNOSIS — C50911 Malignant neoplasm of unspecified site of right female breast: Secondary | ICD-10-CM

## 2016-08-02 DIAGNOSIS — G620 Drug-induced polyneuropathy: Secondary | ICD-10-CM

## 2016-08-02 DIAGNOSIS — F39 Unspecified mood [affective] disorder: Secondary | ICD-10-CM

## 2016-08-02 DIAGNOSIS — I10 Essential (primary) hypertension: Secondary | ICD-10-CM

## 2016-08-02 DIAGNOSIS — I890 Lymphedema, not elsewhere classified: Secondary | ICD-10-CM

## 2016-08-02 DIAGNOSIS — F319 Bipolar disorder, unspecified: Secondary | ICD-10-CM

## 2016-08-02 DIAGNOSIS — Z720 Tobacco use: Secondary | ICD-10-CM

## 2016-08-02 DIAGNOSIS — F1910 Other psychoactive substance abuse, uncomplicated: Secondary | ICD-10-CM | POA: Diagnosis not present

## 2016-08-02 DIAGNOSIS — Z170 Estrogen receptor positive status [ER+]: Principal | ICD-10-CM

## 2016-08-02 DIAGNOSIS — C50411 Malignant neoplasm of upper-outer quadrant of right female breast: Secondary | ICD-10-CM

## 2016-08-02 DIAGNOSIS — Z86000 Personal history of in-situ neoplasm of breast: Secondary | ICD-10-CM

## 2016-08-02 LAB — CBC & DIFF AND RETIC
BASO%: 0.7 % (ref 0.0–2.0)
BASOS ABS: 0 10*3/uL (ref 0.0–0.1)
EOS%: 2.8 % (ref 0.0–7.0)
Eosinophils Absolute: 0.1 10*3/uL (ref 0.0–0.5)
HCT: 40.1 % (ref 34.8–46.6)
HEMOGLOBIN: 13.5 g/dL (ref 11.6–15.9)
Immature Retic Fract: 2 % (ref 1.60–10.00)
LYMPH%: 25.8 % (ref 14.0–49.7)
MCH: 29.3 pg (ref 25.1–34.0)
MCHC: 33.7 g/dL (ref 31.5–36.0)
MCV: 87.2 fL (ref 79.5–101.0)
MONO#: 0.2 10*3/uL (ref 0.1–0.9)
MONO%: 4.6 % (ref 0.0–14.0)
NEUT%: 66.1 % (ref 38.4–76.8)
NEUTROS ABS: 3 10*3/uL (ref 1.5–6.5)
PLATELETS: 135 10*3/uL — AB (ref 145–400)
RBC: 4.6 10*6/uL (ref 3.70–5.45)
RDW: 14.6 % — AB (ref 11.2–14.5)
Retic %: 1.71 % (ref 0.70–2.10)
Retic Ct Abs: 78.66 10*3/uL (ref 33.70–90.70)
WBC: 4.6 10*3/uL (ref 3.9–10.3)
lymph#: 1.2 10*3/uL (ref 0.9–3.3)
nRBC: 0 % (ref 0–0)

## 2016-08-02 LAB — COMPREHENSIVE METABOLIC PANEL
ALBUMIN: 3.8 g/dL (ref 3.5–5.0)
ALK PHOS: 90 U/L (ref 40–150)
ALT: 37 U/L (ref 0–55)
ANION GAP: 10 meq/L (ref 3–11)
AST: 35 U/L — ABNORMAL HIGH (ref 5–34)
BILIRUBIN TOTAL: 0.48 mg/dL (ref 0.20–1.20)
BUN: 13.3 mg/dL (ref 7.0–26.0)
CO2: 26 mEq/L (ref 22–29)
Calcium: 9.4 mg/dL (ref 8.4–10.4)
Chloride: 103 mEq/L (ref 98–109)
Creatinine: 1.1 mg/dL (ref 0.6–1.1)
EGFR: 68 mL/min/{1.73_m2} — AB (ref >90)
Glucose: 85 mg/dl (ref 70–140)
POTASSIUM: 3.8 meq/L (ref 3.5–5.1)
SODIUM: 139 meq/L (ref 136–145)
Total Protein: 8 g/dL (ref 6.4–8.3)

## 2016-08-02 MED ORDER — LETROZOLE 2.5 MG PO TABS: 2.5000 mg | ORAL_TABLET | Freq: Every day | ORAL | 3 refills | Status: DC

## 2016-08-02 NOTE — Telephone Encounter (Signed)
Appointments scheduled per 3/13 LOS. Patient given AVS report and calendars with future scheduled appointments.  °

## 2016-08-03 ENCOUNTER — Ambulatory Visit: Payer: Medicare HMO | Admitting: Physical Therapy

## 2016-08-05 ENCOUNTER — Ambulatory Visit: Payer: Medicare HMO | Admitting: Physical Therapy

## 2016-08-30 ENCOUNTER — Telehealth: Payer: Self-pay | Admitting: *Deleted

## 2016-08-30 NOTE — Telephone Encounter (Signed)
Called Marissa Brooks to have her check the systems for any old colon reports. [name] PV

## 2016-08-30 NOTE — Telephone Encounter (Signed)
Not sure why she is listed as recall colonoscopy.  Can you please pull any paper charting about her?  Maybe there is a colonoscoy report from the past??    Dr. Carlean Purl saw her in hosp 2016 in consult.

## 2016-08-30 NOTE — Telephone Encounter (Signed)
Dr Ardis Hughs,  I had Marissa Brooks search for any old colon reports and there are not any. It appears she was scheduled with you in 2012  For a colon and she did not stop eating to do her prep and there were issues with contacting her due to invalid phone numbers.   She was scheduled at [facility] but never had a procedure .  I looked through her care every where and I cannot find a colon report there either. All I can find is the EGD in 2016 with Dr Carlean Purl.    Please advise.  Do you want OV ???  Thanks [name]

## 2016-08-30 NOTE — Telephone Encounter (Signed)
Please cancel the case.  I don't think I've ever seen her before.  Not sure w

## 2016-08-30 NOTE — Telephone Encounter (Signed)
Dr Ardis Hughs,  I am reviewing this pt's chart in [place] today.  She was scheduled with you at [facility] back in 2012 for a colon that she did not have . She has a very complicated med. Hx of Hep C, Right and left breast cancer with bilat mastectomy, constipation, GI bleed, GERD, Bipolar, htn, Anemia. She had left breast cancer 2006 and then right in 2016. She is currently on Letrozole daily per her last onc note 08-02-2016. In his note it also states pt is with current substance abuse rehab and still using cocaine and marijuana. She had an EGD at [facility] with Dr Carlean Purl 03-17-2015, and in his note he states pt needed supportive breathing after scope, ok after several minutes of mask ventilation with some assistance. Pt is scheduled for her procedure in the [place] with you 5-8 Tuesday .  With this complicated hx do you want her to have an OV with you? Please advise, thanks for your time, Marijean Niemann

## 2016-08-31 NOTE — Telephone Encounter (Signed)
I guess she is being referred for a screening exam then.  She needs NGI appt with Dr. Carlean Purl or myself.  Thanks  dj

## 2016-08-31 NOTE — Telephone Encounter (Signed)
Attempted to call pt to schedule an OV due to her complicated history. Had to LM to return call.  Marissa Brooks PV

## 2016-08-31 NOTE — Telephone Encounter (Signed)
Scheduled Ov with pt to see DR Carlean Purl- 11-08-1028 am. Mailed AVS to pt with date time. Instructed pt to check in 3rd floor 15 minutes early. Pt verbalized understanding.  [name] PV

## 2016-08-31 NOTE — Telephone Encounter (Signed)
Did cancel PV and colon that was scheduled per Dr Ardis HughsLelan Pons

## 2016-09-27 ENCOUNTER — Encounter: Payer: Self-pay | Admitting: Gastroenterology

## 2016-11-08 ENCOUNTER — Ambulatory Visit: Payer: Self-pay | Admitting: Internal Medicine

## 2016-12-02 ENCOUNTER — Ambulatory Visit: Payer: Self-pay | Admitting: Hematology

## 2016-12-02 ENCOUNTER — Other Ambulatory Visit: Payer: Self-pay

## 2016-12-27 ENCOUNTER — Other Ambulatory Visit: Payer: Self-pay | Admitting: Hematology

## 2016-12-27 DIAGNOSIS — C50411 Malignant neoplasm of upper-outer quadrant of right female breast: Secondary | ICD-10-CM

## 2016-12-27 DIAGNOSIS — Z170 Estrogen receptor positive status [ER+]: Principal | ICD-10-CM

## 2017-01-02 ENCOUNTER — Encounter: Payer: Self-pay | Admitting: Hematology

## 2017-01-02 ENCOUNTER — Other Ambulatory Visit: Payer: Self-pay

## 2017-01-03 ENCOUNTER — Ambulatory Visit: Payer: Self-pay | Admitting: Internal Medicine

## 2017-01-03 NOTE — Progress Notes (Signed)
This encounter was created in error - please disregard.

## 2017-01-11 ENCOUNTER — Telehealth: Payer: Self-pay | Admitting: Hematology

## 2017-01-11 NOTE — Telephone Encounter (Signed)
Rescheduled pt's missed appt. Gave her an updated calendar.

## 2017-01-24 ENCOUNTER — Ambulatory Visit (INDEPENDENT_AMBULATORY_CARE_PROVIDER_SITE_OTHER): Payer: Medicare HMO | Admitting: Internal Medicine

## 2017-01-24 ENCOUNTER — Encounter (INDEPENDENT_AMBULATORY_CARE_PROVIDER_SITE_OTHER): Payer: Self-pay

## 2017-01-24 ENCOUNTER — Encounter: Payer: Self-pay | Admitting: Internal Medicine

## 2017-01-24 ENCOUNTER — Other Ambulatory Visit (INDEPENDENT_AMBULATORY_CARE_PROVIDER_SITE_OTHER): Payer: Medicare HMO

## 2017-01-24 VITALS — BP 128/70 | HR 72 | Ht 62.0 in | Wt 175.4 lb

## 2017-01-24 DIAGNOSIS — B182 Chronic viral hepatitis C: Secondary | ICD-10-CM | POA: Diagnosis not present

## 2017-01-24 DIAGNOSIS — F1910 Other psychoactive substance abuse, uncomplicated: Secondary | ICD-10-CM

## 2017-01-24 DIAGNOSIS — K649 Unspecified hemorrhoids: Secondary | ICD-10-CM | POA: Insufficient documentation

## 2017-01-24 DIAGNOSIS — K5909 Other constipation: Secondary | ICD-10-CM

## 2017-01-24 DIAGNOSIS — Z1211 Encounter for screening for malignant neoplasm of colon: Secondary | ICD-10-CM

## 2017-01-24 DIAGNOSIS — K2100 Gastro-esophageal reflux disease with esophagitis, without bleeding: Secondary | ICD-10-CM

## 2017-01-24 LAB — CBC WITH DIFFERENTIAL/PLATELET
BASOS PCT: 0.8 % (ref 0.0–3.0)
Basophils Absolute: 0 10*3/uL (ref 0.0–0.1)
Eosinophils Absolute: 0.1 10*3/uL (ref 0.0–0.7)
Eosinophils Relative: 2.4 % (ref 0.0–5.0)
HCT: 39.5 % (ref 36.0–46.0)
HEMOGLOBIN: 13.4 g/dL (ref 12.0–15.0)
LYMPHS ABS: 1 10*3/uL (ref 0.7–4.0)
Lymphocytes Relative: 17.9 % (ref 12.0–46.0)
MCHC: 33.9 g/dL (ref 30.0–36.0)
MCV: 88 fl (ref 78.0–100.0)
MONO ABS: 0.3 10*3/uL (ref 0.1–1.0)
MONOS PCT: 5.7 % (ref 3.0–12.0)
Neutro Abs: 4.2 10*3/uL (ref 1.4–7.7)
Neutrophils Relative %: 73.2 % (ref 43.0–77.0)
Platelets: 132 10*3/uL — ABNORMAL LOW (ref 150.0–400.0)
RBC: 4.48 Mil/uL (ref 3.87–5.11)
RDW: 13.7 % (ref 11.5–15.5)
WBC: 5.7 10*3/uL (ref 4.0–10.5)

## 2017-01-24 LAB — COMPREHENSIVE METABOLIC PANEL
ALBUMIN: 4.4 g/dL (ref 3.5–5.2)
ALT: 32 U/L (ref 0–35)
AST: 31 U/L (ref 0–37)
Alkaline Phosphatase: 78 U/L (ref 39–117)
BILIRUBIN TOTAL: 0.4 mg/dL (ref 0.2–1.2)
BUN: 16 mg/dL (ref 6–23)
CHLORIDE: 103 meq/L (ref 96–112)
CO2: 23 meq/L (ref 19–32)
Calcium: 10 mg/dL (ref 8.4–10.5)
Creatinine, Ser: 1.14 mg/dL (ref 0.40–1.20)
GFR: 62.75 mL/min (ref >60.00)
GLUCOSE: 102 mg/dL — AB (ref 70–99)
Potassium: 4.2 mEq/L (ref 3.5–5.1)
SODIUM: 136 meq/L (ref 135–145)
TOTAL PROTEIN: 7.8 g/dL (ref 6.0–8.3)

## 2017-01-24 LAB — PROTIME-INR
INR: 1.1 ratio — ABNORMAL HIGH (ref 0.8–1.0)
Prothrombin Time: 11.8 s (ref 9.6–13.1)

## 2017-01-24 MED ORDER — POLYETHYLENE GLYCOL 3350 17 G PO PACK: PACK | ORAL | 11 refills | Status: DC

## 2017-01-24 MED ORDER — BISACODYL 5 MG PO TBEC: 5.0000 mg | DELAYED_RELEASE_TABLET | Freq: Every day | ORAL | 11 refills | Status: DC | PRN

## 2017-01-24 NOTE — Patient Instructions (Addendum)
You have been scheduled for an endoscopy and colonoscopy. Please follow the written instructions given to you at your visit today. Please pick up your prep supplies at the pharmacy. If you use inhalers (even only as needed), please bring them with you on the day of your procedure. Your physician has requested that you go to www.startemmi.com and enter the access code given to you at your visit today. This web site gives a general overview about your procedure. However, you should still follow specific instructions given to you by our office regarding your preparation for the procedure.  You may have a light breakfast the morning of prep day (the day before the procedure).   You may choose from one of the following items: eggs and toast OR chicken noodle soup and crackers.   You should have your breakfast completed between 8:00 and 9:00 am the day before your procedure.    After you have had your light breakfast you should start a clear liquid diet only, NO SOLIDS. No additional solid food is allowed. You may continue to have clear liquid up to 3 hours prior to your procedure.      Your physician has requested that you go to the basement for lab work before leaving today.   We have sent the following medications to your pharmacy for you to pick up at your convenience: miralax and dulcolax   Normal BMI (Body Mass Index- based on height and weight) is between 19 and 25. Your BMI today is Body mass index is 32.08 kg/m. Marland Kitchen Please consider follow up  regarding your BMI with your Primary Care Provider.   You have been scheduled for an abdominal ultrasound at [facility] at [address], [address] on 02/02/17 at 9:45AM. Please arrive 20 minutes prior to your appointment for registration. Make certain not to have anything to eat or drink 6 hours prior to your appointment. Should you need to reschedule your appointment, please contact radiology at [phone]. This test typically takes  about 30 minutes to perform.   I appreciate the opportunity to care for you. Silvano Rusk, MD, [facility]

## 2017-01-24 NOTE — Progress Notes (Signed)
Marissa Brooks 61 y.o. 07/26/1955 [id number]  Referred by: Marissa Brooks, [place], [place] [address]  Assessment & Plan:   Encounter Diagnoses  Name Primary?  . Chronic hepatitis C without hepatic coma ([place]) Yes  . GERD with esophagitis   . Colon cancer screening   . Chronic constipation   . Bleeding hemorrhoids   . Polysubstance abuse-alcohol, cocaine, marijuana     She think she was treated for hepatitis C. I don't think this was recent, might of been an interferon based regimen and she had a positive level in 2016. Recheck levels, CBC, metabolic panel, INR today. Limited right upper quadrant ultrasound. Have instructed her to reduce and try to eliminate drug use and alcohol use. She is involved in a substance abuse program to try to stop but has been unable. She says she is reducing.  EGD to follow-up on her severe esophagitis that I saw in 2016. Also screen for varices in case she does have cirrhosis.  Colonoscopy for screening  The risks and benefits as well as alternatives of endoscopic procedure(s) have been discussed and reviewed. All questions answered. The patient agrees to proceed.  Daily MiraLAX and intermittent Dulcolax for constipation. If this fails to work consider something lubiprostone. She does seem to have some anorectal dysfunction on rectal exam today. Consider pelvic floor physical therapy versus anorectal manometry but I think trying aim more aggressive bowel regimen make sense before any of this. I chose not to work on diet yet given history and exam that we can review that pending the colonoscopy results for screening and response to the treatment above.  Further plans pending these results and clinical course  I appreciate the opportunity to care for this patient.  CC: Marissa Reach, Marissa Brooks Marissa Brooks     Subjective:   Chief Complaint: Colon cancer screening in a patient with complicated medical history  HPI The patient  is a very nice 61 year old widowed African-American woman with a long history of hepatitis C and substance abuse, who is here for screening colonoscopy discussion. She has never had a colonoscopy she was scheduled for 2012 but did not do it. She does suffer from chronic constipation moving her bowels about every 3-4 day she gets an urge to go but has difficulty straining and hard stools. There is some rectal bleeding at times. These symptoms are all chronic. She has not tried laxatives that she can remember.  He has a history of severe ulcerative esophagitis when she presented to the hospital with vomiting of blood in 2016. She says now she only tends to get heartburn with tomato sauce like spaghetti sauce. No dysphagia. Not on PPI. No endoscopy since 2016.  She believes she was treated for hepatitis C years ago at [facility]*cannot find any documentation that and in 2016 she had a positive quantitative RNA.  She is reducing drug and alcohol use she says, 340 ounce beers a day, she understands the need to stop this and she has stopped marijuana and "slowed down" on crack. Crack I think is once or twice a week. Last smoked crack about 4 days ago. Allergies  Allergen Reactions  . Centrum Hives   Current Meds  Medication Sig  . ARIPiprazole (ABILIFY) 5 MG tablet Take 1 tablet (5 mg total) by mouth daily. For mood control  . letrozole (FEMARA) 2.5 MG tablet Take 1 tablet (2.5 mg total) by mouth daily.  Marland Kitchen lisinopril (PRINIVIL,ZESTRIL) 10 MG tablet Take  10 mg by mouth daily.  . sertraline (ZOLOFT) 100 MG tablet Take 1 tablet (100 mg total) by mouth at bedtime. For depression  . traZODone (DESYREL) 100 MG tablet Take 1 tablet (100 mg total) by mouth at bedtime. For sleep   Past Medical History:  Diagnosis Date  . Allergy   . Anxiety   . ARF (acute renal failure) ([place]) 03/15/2015  . Arthritis   . Bipolar disorder ([place])   . Breast cancer ([place]) 09/10/14   right breast,hx left breast ca,2002 [place]  . Cancer [facility]) 2002   left breast cancer tx in [place], right breast cancer 08/2014  . Cocaine abuse    last use 10/13/14  . Depression   . ETOH abuse    as of 10/21/14 none for a week  . GERD (gastroesophageal reflux disease)   . HCAP (healthcare-associated pneumonia) 03/17/2015  . HCV (hepatitis C virus)   . Hypertension   . Lymphedema of upper extremity 08/04/2015  . Peripheral neuropathy due to chemotherapy ([place]) 06/15/2015  . Pneumonia   . Shortness of breath dyspnea    with walking  . Tobacco abuse    Past Surgical History:  Procedure Laterality Date  . BREAST SURGERY  2002   left mastectomy with implant  . ESOPHAGOGASTRODUODENOSCOPY (EGD) WITH PROPOFOL N/A 03/17/2015   Procedure: ESOPHAGOGASTRODUODENOSCOPY (EGD) WITH PROPOFOL;  Surgeon: Marissa Mayer, Marissa Brooks;  [facility];  Service: Endoscopy;  Laterality: N/A;  . MANDIBLE FRACTURE SURGERY    . MASTECTOMY     and Implant  . MASTECTOMY W/ SENTINEL NODE BIOPSY Right 10/30/2014  . SIMPLE MASTECTOMY WITH AXILLARY SENTINEL NODE BIOPSY Right 10/30/2014   Procedure: RIGHT MASTECTOMY WITH SENTINEL NODE MAPPING;  Surgeon: Marissa Messing III, Marissa Brooks;  Location: [facility];  Service: General;  Laterality: Right;  . TONSILLECTOMY     Social History   Social History  . Marital status: Widowed    Spouse name: N/A  . Number of children: 0  . Years of education: 12+   Occupational History  . Disabled    Social History Main Topics  . Smoking status: Current Every Day Smoker    Packs/day: 0.25    Types: Cigarettes  . Smokeless tobacco: Never Used  . Alcohol use Yes     Comment: 3-4 40 oz/day   . Drug use: Yes    Types: Cocaine, Marijuana     Comment: Crack 1-2 times a week, 3 beers daily, has given up marijuana lately as of 01/24/2017  . Sexual activity: Not Currently    Birth control/ protection: Abstinence   Other Topics Concern  . 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   Review of Systems As per history of present illness. Somewhat diffusely positive with insomnia depressed mood fatigue changes. She feels like her bipolar is okay" at this time. Some urinary leakage at times. Headaches decreased hearing. Remainder of the review of  systems is negative or as per history of present illness.  Objective:   Physical Exam @BP  128/70   Pulse 72   Ht 5\' 2"  (1.575 m)   Wt 175 lb 6.4 oz (79.6 kg)   BMI 32.08 kg/m @  General:  Well-developed, well-nourished and in no acute distress Eyes:  anicteric. ENT:   Mouth and posterior pharynx free of lesions.  Neck:   supple w/o thyromegaly or mass.  Lungs: Clear to auscultation bilaterally. Heart:  S1S2, no rubs, murmurs, gallops. Abdomen:  soft, non-tender, no hepatosplenomegaly, hernia, or mass and BS+.  Rectal:  Marissa Brooks Marissa Brooks present  Small anal tags soft. Normal anoderm otherwise. Nontender normal resting tone intact anal wink. Hard formed brown stool present in rectal vault without mass or rectocele. Good voluntary squeeze. Simulated defecation with appropriate abdominal contraction but poor descent and relaxation of the anal sphincter.  Anoscopy is also performed with Marissa Brooks present and demonstrates grade 2 mildly inflamed internal hemorrhoids. All positions.   Lymph:  no cervical or supraclavicular adenopathy. Extremities:   no  cyanosis or clubbing Skin   no rash. I do not see stigmata of chronic liver disease Neuro:   A&O x 3. She does seem to have some very mild tardive dyskinesia Psych:  appropriate mood and  Affect.   Data Reviewed: Oncology notes 2018 2017 2016 EGD with images 03/17/2015 2018 labs in EMR

## 2017-01-30 LAB — HEPATITIS C RNA QUANTITATIVE
HCV Quantitative Log: 6.54 Log IU/mL — ABNORMAL HIGH
HCV Quantitative: 3470000 IU/mL — ABNORMAL HIGH

## 2017-02-01 ENCOUNTER — Other Ambulatory Visit: Payer: Self-pay

## 2017-02-01 ENCOUNTER — Encounter: Payer: Self-pay | Admitting: Internal Medicine

## 2017-02-01 DIAGNOSIS — B182 Chronic viral hepatitis C: Secondary | ICD-10-CM

## 2017-02-01 NOTE — Progress Notes (Signed)
Still has HCV  She should be referred to ID clinic re chronic HCV

## 2017-02-02 ENCOUNTER — Ambulatory Visit
Admission: RE | Admit: 2017-02-02 | Discharge: 2017-02-02 | Disposition: A | Discharge status: Home / Self Care | Payer: Medicare HMO | Source: Ambulatory Visit | Attending: Internal Medicine | Admitting: Internal Medicine

## 2017-02-02 DIAGNOSIS — B182 Chronic viral hepatitis C: Secondary | ICD-10-CM

## 2017-02-03 ENCOUNTER — Ambulatory Visit: Payer: Self-pay | Admitting: Hematology

## 2017-02-03 ENCOUNTER — Other Ambulatory Visit: Payer: Self-pay

## 2017-02-03 NOTE — Progress Notes (Signed)
Ultrasound looks good but she still has hepatitis C or got it again (hx of treatment) Please refer to [facility] for Chronic HCV evaluate and treat

## 2017-02-09 ENCOUNTER — Other Ambulatory Visit: Payer: Self-pay

## 2017-02-09 ENCOUNTER — Ambulatory Visit: Payer: Self-pay | Admitting: Hematology

## 2017-02-09 ENCOUNTER — Telehealth: Payer: Self-pay | Admitting: Hematology

## 2017-02-09 NOTE — Telephone Encounter (Signed)
Patient called and transportation went to the wrong address she is not reschedule for 10/1

## 2017-02-20 ENCOUNTER — Ambulatory Visit: Payer: Self-pay | Admitting: Hematology

## 2017-02-20 ENCOUNTER — Other Ambulatory Visit: Payer: Self-pay

## 2017-02-22 ENCOUNTER — Encounter: Payer: Self-pay | Admitting: Internal Medicine

## 2017-02-23 ENCOUNTER — Encounter: Payer: Self-pay | Admitting: Internal Medicine

## 2017-02-28 ENCOUNTER — Encounter: Payer: Self-pay | Admitting: Hematology

## 2017-02-28 ENCOUNTER — Telehealth: Payer: Self-pay | Admitting: Hematology

## 2017-02-28 ENCOUNTER — Other Ambulatory Visit (HOSPITAL_BASED_OUTPATIENT_CLINIC_OR_DEPARTMENT_OTHER): Payer: Medicare HMO

## 2017-02-28 ENCOUNTER — Ambulatory Visit (HOSPITAL_BASED_OUTPATIENT_CLINIC_OR_DEPARTMENT_OTHER): Payer: Medicare HMO | Admitting: Hematology

## 2017-02-28 VITALS — BP 138/71 | HR 59 | Temp 98.7°F | Resp 20 | Ht 62.0 in | Wt 175.4 lb

## 2017-02-28 DIAGNOSIS — C50411 Malignant neoplasm of upper-outer quadrant of right female breast: Secondary | ICD-10-CM | POA: Diagnosis not present

## 2017-02-28 DIAGNOSIS — I10 Essential (primary) hypertension: Secondary | ICD-10-CM | POA: Diagnosis not present

## 2017-02-28 DIAGNOSIS — K7460 Unspecified cirrhosis of liver: Secondary | ICD-10-CM

## 2017-02-28 DIAGNOSIS — Z170 Estrogen receptor positive status [ER+]: Secondary | ICD-10-CM

## 2017-02-28 DIAGNOSIS — F1910 Other psychoactive substance abuse, uncomplicated: Secondary | ICD-10-CM | POA: Diagnosis not present

## 2017-02-28 DIAGNOSIS — Z23 Encounter for immunization: Secondary | ICD-10-CM

## 2017-02-28 DIAGNOSIS — B182 Chronic viral hepatitis C: Secondary | ICD-10-CM

## 2017-02-28 DIAGNOSIS — D696 Thrombocytopenia, unspecified: Secondary | ICD-10-CM

## 2017-02-28 DIAGNOSIS — Z86000 Personal history of in-situ neoplasm of breast: Secondary | ICD-10-CM

## 2017-02-28 DIAGNOSIS — G620 Drug-induced polyneuropathy: Secondary | ICD-10-CM

## 2017-02-28 DIAGNOSIS — I890 Lymphedema, not elsewhere classified: Secondary | ICD-10-CM | POA: Diagnosis not present

## 2017-02-28 DIAGNOSIS — F39 Unspecified mood [affective] disorder: Secondary | ICD-10-CM | POA: Diagnosis not present

## 2017-02-28 DIAGNOSIS — T451X5A Adverse effect of antineoplastic and immunosuppressive drugs, initial encounter: Secondary | ICD-10-CM

## 2017-02-28 DIAGNOSIS — F319 Bipolar disorder, unspecified: Secondary | ICD-10-CM | POA: Diagnosis not present

## 2017-02-28 DIAGNOSIS — Z720 Tobacco use: Secondary | ICD-10-CM

## 2017-02-28 LAB — CBC WITH DIFFERENTIAL/PLATELET
BASO%: 0.4 % (ref 0.0–2.0)
BASOS ABS: 0 10*3/uL (ref 0.0–0.1)
EOS ABS: 0.1 10*3/uL (ref 0.0–0.5)
EOS%: 2.6 % (ref 0.0–7.0)
HEMATOCRIT: 38 % (ref 34.8–46.6)
HEMOGLOBIN: 12.7 g/dL (ref 11.6–15.9)
LYMPH#: 1.6 10*3/uL (ref 0.9–3.3)
LYMPH%: 29.8 % (ref 14.0–49.7)
MCH: 29.1 pg (ref 25.1–34.0)
MCHC: 33.4 g/dL (ref 31.5–36.0)
MCV: 87 fL (ref 79.5–101.0)
MONO#: 0.4 10*3/uL (ref 0.1–0.9)
MONO%: 6.9 % (ref 0.0–14.0)
NEUT#: 3.2 10*3/uL (ref 1.5–6.5)
NEUT%: 60.3 % (ref 38.4–76.8)
Platelets: 151 10*3/uL (ref 145–400)
RBC: 4.37 10*6/uL (ref 3.70–5.45)
RDW: 14.3 % (ref 11.2–14.5)
WBC: 5.4 10*3/uL (ref 3.9–10.3)

## 2017-02-28 LAB — COMPREHENSIVE METABOLIC PANEL
ALT: 38 U/L (ref 0–55)
ANION GAP: 8 meq/L (ref 3–11)
AST: 35 U/L — ABNORMAL HIGH (ref 5–34)
Albumin: 3.9 g/dL (ref 3.5–5.0)
Alkaline Phosphatase: 85 U/L (ref 40–150)
BUN: 10.4 mg/dL (ref 7.0–26.0)
CALCIUM: 9.7 mg/dL (ref 8.4–10.4)
CHLORIDE: 108 meq/L (ref 98–109)
CO2: 24 mEq/L (ref 22–29)
Creatinine: 0.9 mg/dL (ref 0.6–1.1)
EGFR: 77 mL/min/{1.73_m2} — ABNORMAL LOW (ref >90)
Glucose: 87 mg/dl (ref 70–140)
POTASSIUM: 4.2 meq/L (ref 3.5–5.1)
Sodium: 140 mEq/L (ref 136–145)
TOTAL PROTEIN: 8 g/dL (ref 6.4–8.3)
Total Bilirubin: 0.32 mg/dL (ref 0.20–1.20)

## 2017-02-28 MED ORDER — LETROZOLE 2.5 MG PO TABS: 2.5000 mg | ORAL_TABLET | Freq: Every day | ORAL | 3 refills | Status: DC

## 2017-02-28 MED ORDER — INFLUENZA VAC SPLIT QUAD 0.5 ML IM SUSY
0.5000 mL | PREFILLED_SYRINGE | Freq: Once | INTRAMUSCULAR | Status: AC
  Administered 2017-02-28: 0.5 mL via INTRAMUSCULAR
  Filled 2017-02-28: qty 0.5

## 2017-02-28 NOTE — Progress Notes (Signed)
[place]  [phone] Fax:([phone]) [phone]  Clinic Follow Up Note   Patient Care Team: Elwyn Reach, MD as PCP - General (Internal Medicine) Jovita Kussmaul, MD as Consulting Physician (General Surgery) Truitt Merle, MD as Consulting Physician (Hematology) Kyung Rudd, MD as Consulting Physician (Radiation Oncology) Sylvan Cheese, NP as Nurse Practitioner (Hematology and Oncology) 02/28/2017  CHIEF COMPLAINTS:  Follow-up of breast cancer  Oncology History   Breast cancer of upper-outer quadrant of right female breast s/p Right MRM 10/30/14   Staging form: Breast, AJCC 7th Edition     Pathologic: Stage IIA (T1b, N1a, cM0) - Unsigned       Breast cancer of upper-outer quadrant of right female breast s/p Right MRM 10/30/14   06/03/2004 Cancer Diagnosis    Left breast DCIS, status post mastectomy      06/06/2014 Mammogram    Mammogram and ultrasound showed an irregular 0.7 cm mass at the 12:30 clock position of right breast. Axillary nodes were negative.      08/12/2014 Initial Biopsy    Right breast 12:30 o'clock biopsy showed invasive ductal carcinoma, grade 2. ER+ (100%), PR+ (88%), HER2/neu negative, Ki67 19%      08/12/2014 Clinical Stage    Stage IA: T1b N0      10/30/2014 Surgery    Right breast mastectomy and axillary node dissection, surgical margins were negative.       10/30/2014 Pathology Results    Right breast radical mastectomy showed invasive ductal carcinoma, grade 1, tumor measuring 1.0 cm, margins were negative lymphovascular invasion (+), 1 sentinel lymph nodes positive, 15 additional axillary nodes negative.  (+) DCIS       10/30/2014 Pathologic Stage    Stage IIA: pT1b pN1a      12/30/2014 - 03/03/2015 Chemotherapy    Docetaxel 75 mg/m, Cytoxan 600 mg/m, every 3 weeks, s/p 4 cycles       03/15/2015 - 03/22/2015 Hospital Admission    Patient was admitted for GI bleeding, required blood transfusion, EDC showed  esophagitis.      06/29/2015 - 08/07/2015 Radiation Therapy    Right chest wall 48 Gy in 24 fractions, right chest wall 1.8 Gy in 1 fractions, Right chest wall scar boost treated to 10 Gy in 5 fractions.      10/21/2015 Survivorship    Survivorship care plan mailed to patient      06/07/2016 -  Anti-estrogen oral therapy    Letrozole 2.5 mg daily, started January 2018.        HISTORY OF PRESENTING ILLNESS:  Marissa Brooks 61 y.o. female is here because of recently diagnosed right breast cancer.   This was discovered by screening mammogram in January 2016. She did not have palpable breast mass or any other symptoms. Biopsy on 08/12/2014 showed invasive ductal carcinoma, ER/PR positive, HER-2 negative. She underwent right breast mastectomy by Dr. Marcello Moores on 10/28/2014. Marland Kitchen   She has had some pain underneath her R armpit with some swelling. She reports that her "drain fell out" and was seen by Dr. Marlou Starks on 7/15 at which time a seroma was drained. Otherwise, she denies any problems with appetite, energy, pain. She does report having lost weight 195 lbs to 186 lbs though over an unspecified time interval. She is now menopausal with last menstrual period being sometime in her late 44s.  Per records obtained from [facility], she underwent left simple mastectomy on at [facility] on 07/16/04 after detection of multiple abnormalities in  the left breast on mammography and was diagnosed with DCIS. On 05/27/05, she underwent placement of left tissue expander but declined additional reconstructive surgery. On 06/15/06, she was seen by Dr. Sophronia Simas for possible adjuvant tamoxifen and was recommended to undergo close surveillance. 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   She follows with Dr. Gala Romney for PCP care and was last seen by them prior to the surgery. She is currently undergoing substance abuse rehab. She reports that 1 pack of cigarettes  lasts her a week and has not used cocaine for a month ago. She does report using heroin in the past. She also has history of bipolar for which she is on Abilify. She is widowed, has no children. She lives with a roommate who she has known for the last 10 years.  CURRENT THERAPY: Letrozole 2.5 mg daily, started January 2018.   INTERIM HISTORY  Marissa Brooks returns for follow up. She presents to the clinic today by herself. She missed her last appointment due to transportation issues. She is doing well overall, she still goes to her substance abuse program daily except Friday. She states she has not drinking much alcohol, and has not used marijuana daily. She is compliant with letrozole, tolerating well. She reports mild arthralgia of her knees, especially during cleaning days. She is physically active, she planned to joint a gym. No other new complaints.   MEDICAL HISTORY:  Past Medical History:  Diagnosis Date  . Allergy   . Anxiety   . ARF (acute renal failure) ([place]) 03/15/2015  . Arthritis   . Bipolar disorder ([place])   . Breast cancer ([place]) 09/10/14   right breast,hx left breast ca,2002 [place]  . Cancer [facility]) 2002   left breast cancer tx in [place], right breast cancer 08/2014  . Cocaine abuse ([place])    last use 10/13/14  . Depression   . ETOH abuse    as of 10/21/14 none for a week  . GERD (gastroesophageal reflux disease)   . HCAP (healthcare-associated pneumonia) 03/17/2015  . HCV (hepatitis C virus)   . Hypertension   . Lymphedema of upper extremity 08/04/2015  . 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 Pneumonia   . Shortness of breath dyspnea    with walking  . Tobacco abuse     SURGICAL HISTORY: Past Surgical History:  Procedure Laterality Date  . BREAST SURGERY  2002   left mastectomy with implant  . ESOPHAGOGASTRODUODENOSCOPY (EGD) WITH PROPOFOL N/A 03/17/2015   Procedure: ESOPHAGOGASTRODUODENOSCOPY (EGD) WITH PROPOFOL;  Surgeon: Gatha Mayer, MD;  Location: [facility];  Service: Endoscopy;  Laterality: N/A;  . MANDIBLE FRACTURE SURGERY    . MASTECTOMY     and Implant  . MASTECTOMY W/ SENTINEL NODE BIOPSY Right 10/30/2014  . SIMPLE MASTECTOMY WITH AXILLARY SENTINEL NODE BIOPSY Right 10/30/2014   Procedure: RIGHT MASTECTOMY WITH SENTINEL NODE MAPPING;  Surgeon: Autumn Messing III, MD;  Location: [facility];  Service: General;  Laterality: Right;  . TONSILLECTOMY      SOCIAL HISTORY: Social History   Social History  . Marital status: Widowed    Spouse name: N/A  . Number of children: 0  . Years of education: 12+   Occupational History  . Disabled    Social History Main Topics  . Smoking status: Current Every Day Smoker    Packs/day: 0.25    Types: Cigarettes  . Smokeless tobacco: Never Used  .  Alcohol use Yes     Comment: 3-4 40 oz/day   . Drug use: Yes    Types: Cocaine, Marijuana     Comment: Crack 1-2 times a week, 3 beers daily, has given up marijuana lately as of 01/24/2017  . Sexual activity: Not Currently    Birth control/ protection: Abstinence   Other Topics Concern  . 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 Mental illness Mother   . Breast cancer Sister 32  . HIV/AIDS Sister        Deceased    ALLERGIES:  is allergic to centrum.  MEDICATIONS:  Current Outpatient Prescriptions  Medication Sig Dispense Refill  . ARIPiprazole (ABILIFY) 5 MG tablet Take 1 tablet (5 mg total) by mouth daily. For mood control 30 tablet 0  . bisacodyl  (DULCOLAX) 5 MG EC tablet Take 1 tablet (5 mg total) by mouth daily as needed for moderate constipation. 30 tablet 11  . letrozole (FEMARA) 2.5 MG tablet Take 1 tablet (2.5 mg total) by mouth daily. 30 tablet 3  . lisinopril (PRINIVIL,ZESTRIL) 10 MG tablet Take 10 mg by mouth daily.    . polyethylene glycol (MIRALAX / GLYCOLAX) packet 17 g daily for chronic constipation 30 packet 11  . sertraline (ZOLOFT) 100 MG tablet Take 1 tablet (100 mg total) by mouth at bedtime. For depression 30 tablet 0  . traZODone (DESYREL) 100 MG tablet Take 1 tablet (100 mg total) by mouth at bedtime. For sleep 30 tablet 0   No current facility-administered medications for this visit.     REVIEW OF SYSTEMS:   Constitutional: Denies fevers, chills or abnormal night sweats Eyes: Denies blurriness of vision, double vision or watery eyes Ears, nose, mouth, throat, and face: Denies mucositis or sore throat Respiratory: Denies cough, dyspnea or wheezes Cardiovascular: Denies palpitation, chest discomfort or lower extremity swelling Gastrointestinal:  Denies nausea, heartburn or change in bowel habits Skin: Denies abnormal skin rashes Lymphatics: Denies new lymphadenopathy or easy bruising (+) R upper extremity lymphedema Neurological:Denies numbness, tingling or new weaknesses Behavioral/Psych: Mood is stable, no new changes  All other systems were reviewed with the patient and are negative.  PHYSICAL EXAMINATION:  ECOG PERFORMANCE STATUS: 0 - Asymptomatic  Vitals:   02/28/17 1323  BP: 138/71  Pulse: (!) 59  Resp: 20  Temp: 98.7 F (37.1 C)  SpO2: 100%   Filed Weights   02/28/17 1323  Weight: 175 lb 6.4 oz (79.6 kg)    GENERAL:alert, no distress and comfortable SKIN: skin color, texture, turgor are normal, no rashes or significant lesions, except multiple large healing skim rash/ulcers and pigmentation/color change on b/l front arms, L>R. EYES: normal, conjunctiva are pink and non-injected, sclera  clear OROPHARYNX:no exudate, no erythema and lips, buccal mucosa, and tongue normal  NECK: supple, thyroid normal size, non-tender, without nodularity LYMPH:  no palpable lymphadenopathy in the cervical, axillary or inguinal BREAST: s/p bilateral mastectomy, s/p left implant placement. exam of the left breast, right  chest wall and bilateral axilla revealed no palpable mass or adenopathy., (+) bilateral mastectomy, left silicone implant, no lymphadenopathy, otherwise negative. LUNGS: clear to auscultation and percussion with normal breathing effort HEART: regular rate & rhythm and no murmurs and no lower extremity edema ABDOMEN:abdomen soft, non-tender and normal bowel sounds Musculoskeletal:no cyanosis of digits and no clubbing  PSYCH: alert & oriented x 3 NEURO: no focal motor/sensory deficits Ext: Moderate right arm lymphedema involving the forearm and hand, right shoulder mobility is normal.  LABORATORY DATA:  I have reviewed the data as listed CBC Latest Ref Rng & Units 02/28/2017 01/24/2017 08/02/2016  WBC 3.9 - 10.3 10e3/uL 5.4 5.7 4.6  Hemoglobin 11.6 - 15.9 g/dL 12.7 13.4 13.5  Hematocrit 34.8 - 46.6 % 38.0 39.5 40.1  Platelets 145 - 400 10e3/uL 151 132.0(L) 135(L)    CMP Latest Ref Rng & Units 02/28/2017 01/24/2017 08/02/2016  Glucose 70 - 140 mg/dl 87 102(H) 85  BUN 7.0 - 26.0 mg/dL 10.4 16 13.3  Creatinine 0.6 - 1.1 mg/dL 0.9 1.14 1.1  Sodium 136 - 145 mEq/L 140 136 139  Potassium 3.5 - 5.1 mEq/L 4.2 4.2 3.8  Chloride 96 - 112 mEq/L - 103 -  CO2 22 - 29 mEq/L 24 23 26   Calcium 8.4 - 10.4 mg/dL 9.7 10.0 9.4  Total Protein 6.4 - 8.3 g/dL 8.0 7.8 8.0  Total Bilirubin 0.20 - 1.20 mg/dL 0.32 0.4 0.48  Alkaline Phos 40 - 150 U/L 85 78 90  AST 5 - 34 U/L 35(H) 31 35(H)  ALT 0 - 55 U/L 38 32 37    PATHOLOGY REPORT 10/30/2014 ADDITIONAL INFORMATION: 2. FLUORESCENCE IN-SITU HYBRIDIZATION  Results: HER2 - NEGATIVE RATIO OF HER2/CEP17 SIGNALS 1.62 AVERAGE HER2 COPY NUMBER PER CELL  2.10 Reference Range: NEGATIVE HER2/CEP17 Ratio <2.0 and average HER2 copy number <4.0 EQUIVOCAL HER2/CEP17 Ratio <2.0 and average HER2 copy number 4.0 and <6.0 POSITIVE HER2/CEP17 Ratio >=2.0 or <2.0 and average HER2 copy number >=6.0 [place] RUND DO Pathologist, Electronic Signature ( Signed 11/26/2014) 2. her2 This is NOT signed out FINAL DIAGNOSIS Diagnosis 1. Lymph node, sentinel, biopsy, Right #1 - ONE LYMPH NODE, POSITIVE FOR METASTATIC MAMMARY CARCINOMA (1/1). - INTRANODAL TUMOR DEPOSIT IS 1.2 CM - POSITIVE FOR EXTRACAPSULAR TUMOR EXTENSION. 2. Breast, radical mastectomy (including lymph nodes), Right and axillary contents - INVASIVE DUCTAL CARCINOMA, SEE COMMENT. - POSITIVE FOR LYMPH VASCULAR INVASION. - DUCTAL CARCINOMA IN SITU WITH NECROSIS AND CALCIFICATIONS. - FIFTEEN LYMPH NODE S, NEGATIVE FOR TUMOR (0/15). - PREVIOUS BIOPSY SITE. - SEE TUMOR SYNOPTIC TEMPLATE BELOW  Microscopic Comment 2. BREAST, INVASIVE TUMOR, WITH LYMPH NODES PRESENT Specimen, including laterality and lymph node sampling (sentinel, non-sentinel): Right breast with sentinel lymph node sampling Procedure: Radical mastectomy Histologic type: Ductal Grade: I of III Tubule formation: 2 Nuclear pleomorphism: 2 Mitotic: 1 Tumor size (gross measurement): 1.0 cm Margins: Invasive, distance to closest margin: 1.8 cm (posterior) In-situ, distance to closest margin: 1.8 cm (posterior) If margin positive, focally or broadly: N/A Lymphovascular invasion: Present Ductal carcinoma in situ: Present Grade: 2 of 3 Extensive intraductal component: Absent Lobular neoplasia: Absent Tumor focality: Unifocal Treatment effect: None If present, treatment effect in breast tissue, lymph nodes or both: N/A Extent of tumor: Skin: N/A Nipple: N/A Skeletal muscle: N/A Lymph nodes: Examined: 1 Sentinel 15 Non-sentinel 16 Total Lymph nodes with metastasis: 1 Isolated tumor cells (< 0.2 mm): 0 Micrometastasis: (>  0.2 mm and < 2.0 mm): 0 Macrometastasis: (> 2.0 mm): x 1 Extracapsular extension:  Present Breast prognostic profile: Estrogen receptor: Not repeated, previous study demonstrated 100% positivity ([id number]) Progesterone receptor: Not repeated, previous study demonstrated 88% positivity ([id number]) Her 2 neu: Repeated, previous study demonstrated no amplification ([id number]) Ki-67: Not repeated, previous study demonstrated 19% proliferation rate ([id number]) Non-neoplastic breast: Previous biopsy site, fibrocystic change, columnar cell change, sclerosing adenosis, benign adenosis and calcifications TNM: p T1b, pN1a, pMX   RADIOGRAPHIC STUDIES: I have personally reviewed the radiological images as listed and agreed with the findings in the report. US Abdomen Limited Ruq  Result Date: 02/02/2017 CLINICAL DATA:  Hepatitis-C. EXAM: ULTRASOUND ABDOMEN LIMITED RIGHT UPPER QUADRANT COMPARISON:  Ultrasound 03/16/2015.  KUB 09/15/2008 . FINDINGS: Gallbladder: No gallstones or wall thickening visualized. No sonographic Murphy sign noted by sonographer. Common bile duct: Diameter: 4.9 mm Liver: No focal lesion identified. Within normal limits in parenchymal echogenicity. Portal vein is patent on color Doppler imaging with normal direction of blood flow towards the liver. IMPRESSION: No acute or focal abnormality identified. Electronically Signed   By: Marcello Moores  Register   On: 02/02/2017 10:43    ASSESSMENT & PLAN:  Marissa Brooks is a 61 y.o. female with h/o left DCIS s/p mastectomy who presents with R invasive ductal carcinoma s/p mastectomy  1. Stage IIa, pT1bpN1a,M0, right invasive ductal carcinoma s/p mastectomy, ER+/PR+/HER- , G1 -Discussed with the patient the results of her surgical path and her cancer staging -The natural history of breast cancer were reviewed with her, and moderate risk of cancer recurrence after her surgery, giving the note positive disease. -She has now completed adjuvant  chemotherapy, and adjuvant radiation. -She did not get a breast reconstruction, but uses a breast prosthesis instead.  -I previously have reviewed the labs, CBC and CMP are WNL except mild thrombocytopenia  -she has started adjuvant letrozole, but it was delayed for 10 months after she completed RT, due to her non-compliance and loss of f/u.  She started in 05/2016. -She is tolerating letrozole well, except a mild arthralgia, we will continue, plan for 5-7 years. -Continue breast cancer surveillance. She has has a bilateral mastectomy, no need for routine screening mammogram. I encouraged her to self exam, and a follow-up with [place] routinely with lab and exam. -She is clinically doing well, labs and exam are unremarkable today, no concern for for breast Cancer recurrence. -Continue surveillance. I plan to see her back in 4 months to follow-up her arthralgia, if she is doing well, plan to see her back every 6 months afterwards  2. Genetics -Giving her history of breast cancer twice, and family history (sister) of breast cancer, I recommended genetic counseling but she declined.   -She does not have children, but does have sisters.  3. History of cirrhosis and HCV infection -No imaging or additional workup found in our system though noted to have undergone treatment for HCV in 1999 at [facility] per note by Aurora Mask  [06/02/09] -Most recent LFTs reassuring -She still has very high HCV load, she is scheduled to see infection disease Dr. Linus Salmons in a few weeks  4. Polysubstance abuse  -Recently hospitalized in February 2016 for substance-induced mood disorder. -Continue to encourage patient to continue with substance rehab and avoid illicit substances -She agrees to quit alcohol and illicit drug (cocaine) completely when she is on chemo, she will work on smoking cessation also -Her urine test was still positive for cocaine on August 9. -The patient denies pain, therefore I will no longer  prescribe Tramadol.  5. Hypertension:  -Continue follow-up  with her primary care physician.  6. Mood disorder:  -She does not follow with a psychiatrist and reports her PCP prescribes all of her psychiatric medications.  -Defer to PCP  7. Right arm lymph edema  -continue PT exercises at home -wears compression sleeve.  8. Cancer screening -she is scheduled to have a screening colonoscopy on 04/05/2017, I encouraged her to keep his appointment  Plan  -Continue letrozole. -She will return for labs and follow up in 4 months, then every 6 months afterwards  -flu shot today   Truitt Merle  02/28/17 1:49 PM  This document serves as a record of services personally performed by Truitt Merle, MD. It was created on her behalf by Joslyn Devon, a trained medical scribe. The creation of this record is based on the scribe's personal observations and the provider's statements to them. This document has been checked and approved by the attending provider.

## 2017-02-28 NOTE — Telephone Encounter (Signed)
Scheduled appt per 10/9 los - Gave patient AVS and calender per los.  

## 2017-03-07 ENCOUNTER — Ambulatory Visit (INDEPENDENT_AMBULATORY_CARE_PROVIDER_SITE_OTHER): Payer: Medicare HMO | Admitting: Internal Medicine

## 2017-03-07 ENCOUNTER — Encounter: Payer: Self-pay | Admitting: Internal Medicine

## 2017-03-07 VITALS — BP 128/83 | HR 73 | Temp 98.9°F | Wt 176.0 lb

## 2017-03-07 DIAGNOSIS — B182 Chronic viral hepatitis C: Secondary | ICD-10-CM

## 2017-03-07 NOTE — Patient Instructions (Signed)
Date 03/07/17  Dear Ms Tia Masker, As discussed in the [facility], your hepatitis C therapy will include highly effective medication(s) for treatment and will vary based on the type of hepatitis C and insurance approval.  Potential medications include:          Harvoni (sofosbuvir 90mg /ledipasvir 400mg ) tablet oral daily          OR     Epclusa (sofosbuvir 400mg /velpatasvir 100mg ) tablet oral daily          OR      Mavyret (glecaprevir 100 mg/pibrentasvir 40 mg): Take 3 tablets oral daily          OR     Zepatier (elbasvir 50 mg/grazoprevir 100 mg) oral daily, +/- ribavirin              Medications are typically for 8 or 12 weeks total ---------------------------------------------------------------- Your HCV Treatment Start Date: You will be notified by our office once the medication is approved and where you can pick it up (or if mailed)   ---------------------------------------------------------------- [place]:   [facility] [place], [place] [address] Phone: [phone] Hours: Monday to Friday 7:30 am to 6:00 pm   Please always contact your pharmacy at least 3-4 business days before you run out of medications to ensure your next month's medication is ready or 1 week prior to running out if you receive it by mail.  Remember, each prescription is for 28 days. ---------------------------------------------------------------- GENERAL NOTES REGARDING YOUR HEPATITIS C MEDICATION:  Some medications have the following interactions:  - Acid reducing agents such as H2 blockers (ie. Pepcid (famotidine), Zantac (ranitidine), Tagamet (cimetidine), Axid (nizatidine) and proton pump inhibitors (ie. Prilosec (omeprazole), Protonix (pantoprazole), Nexium (esomeprazole), or Aciphex (rabeprazole)). Do not take until you have discussed with a health care provider.    -Antacids that contain magnesium and/or aluminum hydroxide (ie. Milk of [place], Rolaids,  Gaviscon, Maalox, Mylanta, an dArthritis Pain Formula).  -Calcium carbonate (calcium supplements or antacids such as Tums, Caltrate, Os-Cal).  -St. John's wort or any products that contain St. John's wort like some herbal supplements  Please inform the office prior to starting any of these medications.  - The common side effects associated with Harvoni include:      1. Fatigue      2. Headache      3. Nausea      4. Diarrhea      5. Insomnia  Please note that this only lists the most common side effects and is NOT a comprehensive list of the potential side effects of these medications. For more information, please review the drug information sheets that come with your medication package from the pharmacy.  ---------------------------------------------------------------- GENERAL HELPFUL HINTS ON HCV THERAPY: 1. Stay well-hydrated. 2. Notify the [facility] of any changes in your other over-the-counter/herbal or prescription medications. 3. If you miss a dose of your medication, take the missed dose as soon as you remember. Return to your regular time/dose schedule the next day.  4.  Do not stop taking your medications without first talking with your healthcare provider. 5.  You may take Tylenol (acetaminophen), as long as the dose is less than 2000 mg (OR no more than 4 tablets of the Tylenol Extra Strengths 500mg  tablet) in 24 hours. 6.  You will see our pharmacist-specialist within the first 2 weeks of starting your medication to monitor for any possible side effects. 7.  You will have labs once during treatment, soon  after treatment completion and one final lab 6 months after treatment completion to verify the virus is out of your system.  Scharlene Gloss, [place] for [place], [place]  [phone] [phone]

## 2017-03-07 NOTE — Progress Notes (Signed)
[place] [facility]   CC: consideration for treatment for chronic hepatitis C  HPI:  +Marissa Brooks is a 61 y.o. female who presents for initial evaluation and management of chronic hepatitis C.  Patient tested positive many years ago. Hepatitis C-associated risk factors present are: IV drug abuse (details: last used in the 1980s). Patient denies renal dialysis, tattoos. Patient has had other studies performed. Results: hepatitis C RNA by PCR, result: positive. Patient has not had prior treatment for Hepatitis C. Patient does not have a past history of liver disease. Patient does not have a family history of liver disease. Patient does not  have associated signs or symptoms related to liver disease.  Labs reviewed and confirm chronic hepatitis C with a positive viral load.  She tells me she thinks she was treated in the 1980s and does not recall any treatment since 2000 so does not appear to have been treated.   Records reviewed from Epic and she saw Dr. Carlean Purl recently and retested her HCV viral load. Also followed by Dr. Burr Medico of oncology.       Patient does not have documented immunity to Hepatitis A. Patient does not have documented immunity to Hepatitis B.    Review of Systems:  Constitutional: negative for fatigue and malaise Gastrointestinal: negative for diarrhea Integument/breast: negative for rash All other systems reviewed and are negative       Past Medical History:  Diagnosis Date  . Allergy   . Anxiety   . ARF (acute renal failure) ([place]) 03/15/2015  . Arthritis   . Bipolar disorder ([place])   . Breast cancer ([place]) 09/10/14   right breast,hx left breast ca,2002 [place]  . Cancer [facility]) 2002   left breast cancer tx in [place], right breast cancer 08/2014  . Cocaine abuse ([place])    last use 10/13/14  . Depression   . ETOH abuse    as of 10/21/14 none for a week  . GERD (gastroesophageal reflux disease)   . HCAP (healthcare-associated  pneumonia) 03/17/2015  . HCV (hepatitis C virus)   . Hypertension   . Lymphedema of upper extremity 08/04/2015  . 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 Pneumonia   . Shortness of breath dyspnea    with walking  . Tobacco abuse     Prior to Admission medications   Medication Sig Start Date End Date Taking? Authorizing Provider  ARIPiprazole (ABILIFY) 5 MG tablet Take 1 tablet (5 mg total) by mouth daily. For mood control 07/12/13  Yes Nwoko, Herbert Pun I, NP  bisacodyl (DULCOLAX) 5 MG EC tablet Take 1 tablet (5 mg total) by mouth daily as needed for moderate constipation. 01/24/17  Yes Gatha Mayer, MD  letrozole [facility]) 2.5 MG tablet Take 1 tablet (2.5 mg total) by mouth daily. 02/28/17  Yes Truitt Merle, MD  lisinopril (PRINIVIL,ZESTRIL) 10 MG tablet Take 10 mg by mouth daily.   Yes [provider]  polyethylene glycol (MIRALAX / GLYCOLAX) packet 17 g daily for chronic constipation 01/24/17  Yes Gatha Mayer, MD  sertraline (ZOLOFT) 100 MG tablet Take 1 tablet (100 mg total) by mouth at bedtime. For depression 07/12/13  Yes Lindell Spar I, NP  traZODone (DESYREL) 100 MG tablet Take 1 tablet (100 mg total) by mouth at bedtime. For sleep 07/12/13  Yes Lindell Spar I, NP    Allergies  Allergen Reactions  . Centrum Hives    Social History  Substance Use Topics  .  Smoking status: Current Every Day Smoker    Packs/day: 0.25    Types: Cigarettes  . Smokeless tobacco: Never Used  . Alcohol use Yes     Comment: 3-4 40 oz/day     Family History  Problem Relation Age of Onset  . Mental illness Mother   . Breast cancer Sister 65  . HIV/AIDS Sister        Deceased     Objective:  Constitutional: in no apparent distress and alert,  Vitals:   03/07/17 1541  BP: 128/83  Pulse: 73  Temp: 98.9 F (37.2 C)   Eyes: anicteric Cardiovascular: Cor RRR Respiratory: CTA B; normal respiratory effort Gastrointestinal: Bowel sounds are normal, liver is not  enlarged, spleen is not enlarged Musculoskeletal: no pedal edema noted Skin: negatives: no rash; no porphyria cutanea tarda Lymphatic: no cervical lymphadenopathy   Laboratory Genotype:  Lab Results  Component Value Date   HCVGENOTYPE 1a 12/16/2014   HCV viral load:  Lab Results  Component Value Date   HCVQUANT 3,470,000 (H) 01/24/2017   Lab Results  Component Value Date   WBC 5.4 02/28/2017   HGB 12.7 02/28/2017   HCT 38.0 02/28/2017   MCV 87.0 02/28/2017   PLT 151 02/28/2017    Lab Results  Component Value Date   CREATININE 0.9 02/28/2017   BUN 10.4 02/28/2017   NA 140 02/28/2017   K 4.2 02/28/2017   CL 103 01/24/2017   CO2 24 02/28/2017    Lab Results  Component Value Date   ALT 38 02/28/2017   AST 35 (H) 02/28/2017   ALKPHOS 85 02/28/2017     Labs and history reviewed and show CHILD-PUGH A  5-6 points: Child class A 7-9 points: Child class B 10-15 points: Child class C  Lab Results  Component Value Date   INR 1.1 (H) 01/24/2017   BILITOT 0.32 02/28/2017   ALBUMIN 3.9 02/28/2017     Assessment: New Patient with Chronic Hepatitis C genotype unknown, untreated.  I discussed with the patient the lab findings that confirm chronic hepatitis C as well as the natural history and progression of disease including about 30% of people who develop cirrhosis of the liver if left untreated and once cirrhosis is established there is a 2-7% risk per year of liver cancer and liver failure.  I discussed the importance of treatment and benefits in reducing the risk, even if significant liver fibrosis exists.   Plan: 1) Patient counseled extensively on limiting acetaminophen to no more than 2 grams daily, avoidance of alcohol. 2) Transmission discussed with patient including sexual transmission, sharing razors and toothbrush.   3) Will need referral to gastroenterology if concern for cirrhosis 4) Will need referral for substance abuse counseling: No.; Further work up to  include urine drug screen  No. 5) Will prescribe appropriate medication based on genotype and coverage  6) Hepatitis A and B titers 7) Pneumovax vaccine if not previously given next visit 9) Further work up to include liver staging with elastography 10) will follow up after starting medication

## 2017-03-10 LAB — HIV ANTIBODY (ROUTINE TESTING W REFLEX): HIV: NONREACTIVE

## 2017-03-10 LAB — LIVER FIBROSIS, FIBROTEST-ACTITEST
ALT: 27 U/L (ref 6–29)
APOLIPOPROTEIN A1: 169 mg/dL (ref 101–198)
Alpha-2-Macroglobulin: 283 mg/dL — ABNORMAL HIGH (ref 106–279)
Bilirubin: 0.3 mg/dL (ref 0.2–1.2)
FIBROSIS SCORE: 0.2
GGT: 39 U/L (ref 3–70)
Haptoglobin: 186 mg/dL (ref 43–212)
Necroinflammat ACT Score: 0.11
Reference ID: 2163695

## 2017-03-10 LAB — HEPATITIS B CORE ANTIBODY, TOTAL: HEP B C TOTAL AB: NONREACTIVE

## 2017-03-10 LAB — HEPATITIS C GENOTYPE

## 2017-03-10 LAB — PROTIME-INR
INR: 1
PROTHROMBIN TIME: 10.9 s (ref 9.0–11.5)

## 2017-03-10 LAB — HEPATITIS B SURFACE ANTIGEN: HEP B S AG: NONREACTIVE

## 2017-03-10 LAB — HEPATITIS B SURFACE ANTIBODY,QUALITATIVE: Hep B S Ab: NONREACTIVE

## 2017-03-10 LAB — HEPATITIS A ANTIBODY, TOTAL: Hepatitis A AB,Total: REACTIVE — AB

## 2017-03-13 ENCOUNTER — Other Ambulatory Visit: Payer: Self-pay | Admitting: Internal Medicine

## 2017-03-13 MED ORDER — LEDIPASVIR-SOFOSBUVIR 90-400 MG PO TABS: 1.0000 | ORAL_TABLET | Freq: Every day | ORAL | 2 refills | Status: DC

## 2017-03-16 ENCOUNTER — Encounter: Payer: Self-pay | Admitting: Pharmacy Technician

## 2017-03-16 ENCOUNTER — Other Ambulatory Visit: Payer: Self-pay | Admitting: Pharmacist

## 2017-03-17 ENCOUNTER — Telehealth: Payer: Self-pay | Admitting: Internal Medicine

## 2017-03-17 NOTE — Telephone Encounter (Signed)
Patient provided the number to central scheduling 

## 2017-03-21 ENCOUNTER — Ambulatory Visit (HOSPITAL_COMMUNITY): Payer: Medicare HMO

## 2017-03-27 ENCOUNTER — Ambulatory Visit (HOSPITAL_COMMUNITY): Payer: Medicare HMO

## 2017-03-28 ENCOUNTER — Encounter: Payer: Self-pay | Admitting: Pharmacy Technician

## 2017-03-28 MED FILL — HARVONI 90-400 MG TABLET: 90-400 | 28 days supply | Qty: 28 | Fill #0

## 2017-03-29 ENCOUNTER — Telehealth: Payer: Self-pay | Admitting: Internal Medicine

## 2017-03-29 NOTE — Telephone Encounter (Signed)
Please advise Sir, thank you. 

## 2017-03-30 ENCOUNTER — Ambulatory Visit (HOSPITAL_COMMUNITY)
Admission: RE | Admit: 2017-03-30 | Discharge: 2017-03-30 | Disposition: A | Discharge status: Home / Self Care | Payer: Medicare HMO | Source: Ambulatory Visit | Attending: Internal Medicine | Admitting: Internal Medicine

## 2017-03-30 DIAGNOSIS — B182 Chronic viral hepatitis C: Secondary | ICD-10-CM | POA: Diagnosis present

## 2017-03-30 NOTE — Telephone Encounter (Signed)
Left a detailed message for Marissa Brooks that this is okay to proceed with her upcoming procedure.  The medicine won't be a problem.

## 2017-03-30 NOTE — Telephone Encounter (Signed)
Yes No problem with that

## 2017-03-31 ENCOUNTER — Telehealth: Payer: Self-pay | Admitting: Internal Medicine

## 2017-03-31 NOTE — Telephone Encounter (Signed)
Patient states she has a colon on Wed 04/05/17 and has started drinking her miralax today. Patient wants a call to discuss instructions and what to do

## 2017-03-31 NOTE — Telephone Encounter (Signed)
Spoke with [name] and questions answered.

## 2017-03-31 NOTE — Telephone Encounter (Signed)
Left a message to call me back

## 2017-03-31 NOTE — Telephone Encounter (Signed)
Patient returning phone call back to [facility].

## 2017-04-05 ENCOUNTER — Ambulatory Visit (AMBULATORY_SURGERY_CENTER): Payer: Medicare HMO | Admitting: Internal Medicine

## 2017-04-05 ENCOUNTER — Encounter: Payer: Self-pay | Admitting: Internal Medicine

## 2017-04-05 VITALS — BP 120/66 | HR 52 | Temp 96.0°F | Resp 11 | Ht 62.0 in | Wt 175.0 lb

## 2017-04-05 DIAGNOSIS — D125 Benign neoplasm of sigmoid colon: Secondary | ICD-10-CM | POA: Diagnosis not present

## 2017-04-05 DIAGNOSIS — K2100 Gastro-esophageal reflux disease with esophagitis, without bleeding: Secondary | ICD-10-CM

## 2017-04-05 DIAGNOSIS — Z1211 Encounter for screening for malignant neoplasm of colon: Secondary | ICD-10-CM

## 2017-04-05 DIAGNOSIS — K635 Polyp of colon: Secondary | ICD-10-CM

## 2017-04-05 DIAGNOSIS — Z1212 Encounter for screening for malignant neoplasm of rectum: Secondary | ICD-10-CM

## 2017-04-05 MED ORDER — SODIUM CHLORIDE 0.9 % IV SOLN: 500.0000 mL | INTRAVENOUS | Status: AC

## 2017-04-05 NOTE — Op Note (Signed)
[place] Patient Name: Marissa Brooks Procedure Date: 04/05/2017 1:21 PM MRN: [id number] Endoscopist: Gatha Mayer , MD Age: 61 Referring MD:  Date of Birth: 08/28/1955 Gender: Female Account #: [id number] Procedure:                Colonoscopy Indications:              Screening for colorectal malignant neoplasm, This                            is the patient's first colonoscopy Medicines:                Propofol per Anesthesia, Monitored Anesthesia Care Procedure:                Pre-Anesthesia Assessment:                           - Prior to the procedure, a History and Physical                            was performed, and patient medications and                            allergies were reviewed. The patient's tolerance of                            previous anesthesia was also reviewed. The risks                            and benefits of the procedure and the sedation                            options and risks were discussed with the patient.                            All questions were answered, and informed consent                            was obtained. Prior Anticoagulants: The patient has                            taken no previous anticoagulant or antiplatelet                            agents. ASA Grade Assessment: II - A patient with                            mild systemic disease. After reviewing the risks                            and benefits, the patient was deemed in                            satisfactory condition to undergo the procedure.  After obtaining informed consent, the colonoscope                            was passed under direct vision. Throughout the                            procedure, the patient's blood pressure, pulse, and                            oxygen saturations were monitored continuously. The                            Colonoscope was introduced through the anus and   advanced to the the cecum, identified by                            appendiceal orifice and ileocecal valve. The                            colonoscopy was performed without difficulty. The                            patient tolerated the procedure well. The quality                            of the bowel preparation was good. The bowel                            preparation used was Miralax. The ileocecal valve,                            appendiceal orifice, and rectum were photographed. Scope In: 1:38:08 PM Scope Out: 1:50:36 PM Scope Withdrawal Time: 0 hours 10 minutes 28 seconds  Total Procedure Duration: 0 hours 12 minutes 28 seconds  Findings:                 The perianal and digital rectal examinations were                            normal.                           A diminutive polyp was found in the sigmoid colon.                            The polyp was sessile. The polyp was removed with a                            cold snare. Resection and retrieval were complete.                            Verification of patient identification for the                            specimen was  done. Estimated blood loss was minimal.                           The exam was otherwise without abnormality on                            direct and retroflexion views. Complications:            No immediate complications. Estimated Blood Loss:     Estimated blood loss was minimal. Impression:               - One diminutive polyp in the sigmoid colon,                            removed with a cold snare. Resected and retrieved.                           - The examination was otherwise normal on direct                            and retroflexion views. Recommendation:           - Patient has a contact number available for                            emergencies. The signs and symptoms of potential                            delayed complications were discussed with the                             patient. Return to normal activities tomorrow.                            Written discharge instructions were provided to the                            patient.                           - Resume previous diet.                           - Continue present medications.                           - Repeat colonoscopy is recommended. The                            colonoscopy date will be determined after pathology                            results from today's exam become available for                            review. Gatha Mayer, MD 04/05/2017 2:01:57 PM This report has been  signed electronically.

## 2017-04-05 NOTE — Op Note (Signed)
[place] Patient Name: Marissa Brooks Procedure Date: 04/05/2017 1:22 PM MRN: [id number] Endoscopist: Gatha Mayer , MD Age: 61 Referring MD:  Date of Birth: 23-Jul-1955 Gender: Female Account #: [id number] Procedure:                Upper GI endoscopy Indications:              Follow-up of gastro-esophageal reflux disease Medicines:                Propofol per Anesthesia, Monitored Anesthesia Care Procedure:                Pre-Anesthesia Assessment:                           - Prior to the procedure, a History and Physical                            was performed, and patient medications and                            allergies were reviewed. The patient's tolerance of                            previous anesthesia was also reviewed. The risks                            and benefits of the procedure and the sedation                            options and risks were discussed with the patient.                            All questions were answered, and informed consent                            was obtained. Prior Anticoagulants: The patient has                            taken no previous anticoagulant or antiplatelet                            agents. ASA Grade Assessment: II - A patient with                            mild systemic disease. After reviewing the risks                            and benefits, the patient was deemed in                            satisfactory condition to undergo the procedure.                           After obtaining informed consent, the endoscope was  passed under direct vision. Throughout the                            procedure, the patient's blood pressure, pulse, and                            oxygen saturations were monitored continuously. The                            Endoscope was introduced through the mouth, and                            advanced to the second part of duodenum. The upper                GI endoscopy was accomplished without difficulty.                            The patient tolerated the procedure well. Scope In: Scope Out: Findings:                 The esophagus was normal.                           The stomach was normal.                           The examined duodenum was normal. Complications:            No immediate complications. Estimated Blood Loss:     Estimated blood loss: none. Impression:               - Normal esophagus. PRIOR ESOPHAGITIS IS HEALED                           - Normal stomach.                           - Normal examined duodenum.                           - No specimens collected. Recommendation:           - Patient has a contact number available for                            emergencies. The signs and symptoms of potential                            delayed complications were discussed with the                            patient. Return to normal activities tomorrow.                            Written discharge instructions were provided to the  patient.                           - Resume previous diet.                           - Continue present medications.                           - See the other procedure note for documentation of                            additional recommendations. Gatha Mayer, MD 04/05/2017 2:00:00 PM This report has been signed electronically.

## 2017-04-05 NOTE — Progress Notes (Signed)
A/ox3, pleased with MAC, report to  [place]

## 2017-04-05 NOTE — Patient Instructions (Addendum)
I found and removed one small colon polyp. No signs of cancer.  The upper exam looked fine - all healed up.  I will let you know pathology results and when to have another routine colonoscopy by mail and/or My Chart.  I appreciate the opportunity to care for you. Gatha Mayer, MD, FACG     YOU HAD AN ENDOSCOPIC PROCEDURE TODAY AT [facility]:   Refer to the procedure report that was given to you for any specific questions about what was found during the examination.  If the procedure report does not answer your questions, please call your gastroenterologist to clarify.  If you requested that your care partner not be given the details of your procedure findings, then the procedure report has been included in a sealed envelope for you to review at your convenience later.  YOU SHOULD EXPECT: Some feelings of bloating in the abdomen. Passage of more gas than usual.  Walking can help get rid of the air that was put into your GI tract during the procedure and reduce the bloating. If you had a lower endoscopy (such as a colonoscopy or flexible sigmoidoscopy) you may notice spotting of blood in your stool or on the toilet paper. If you underwent a bowel prep for your procedure, you may not have a normal bowel movement for a few days.  Please Note:  You might notice some irritation and congestion in your nose or some drainage.  This is from the oxygen used during your procedure.  There is no need for concern and it should clear up in a day or so.  SYMPTOMS TO REPORT IMMEDIATELY:   Following lower endoscopy (colonoscopy or flexible sigmoidoscopy):  Excessive amounts of blood in the stool  Significant tenderness or worsening of abdominal pains  Swelling of the abdomen that is new, acute  Fever of 100F or higher   Following upper endoscopy (EGD)  Vomiting of blood or coffee ground material  New chest pain or pain under the shoulder blades  Painful or persistently  difficult swallowing  New shortness of breath  Fever of 100F or higher  Black, tarry-looking stools  For urgent or emergent issues, a gastroenterologist can be reached at any hour by calling [phone].   DIET:  We do recommend a small meal at first, but then you may proceed to your regular diet.  Drink plenty of fluids but you should avoid alcoholic beverages for 24 hours.  MEDICATIONS: Continue present medications.  Please see handouts given to you by your recovery nurse.  ACTIVITY:  You should plan to take it easy for the rest of today and you should NOT DRIVE or use heavy machinery until tomorrow (because of the sedation medicines used during the test).    FOLLOW UP: Our staff will call the number listed on your records the next business day following your procedure to check on you and address any questions or concerns that you may have regarding the information given to you following your procedure. If we do not reach you, we will leave a message.  However, if you are feeling well and you are not experiencing any problems, there is no need to return our call.  We will assume that you have returned to your regular daily activities without incident.  If any biopsies were taken you will be contacted by phone or by letter within the next 1-3 weeks.  Please call us at [phone] if you have not heard about  the biopsies in 3 weeks.   Thank you for allowing [place] to provide for your healthcare needs today.  SIGNATURES/CONFIDENTIALITY: You and/or your care partner have signed paperwork which will be entered into your electronic medical record.  These signatures attest to the fact that that the information above on your After Visit Summary has been reviewed and is understood.  Full responsibility of the confidentiality of this discharge information lies with you and/or your care-partner.

## 2017-04-05 NOTE — Progress Notes (Signed)
Called to room to assist during endoscopic procedure.  Patient ID and intended procedure confirmed with present staff. Received instructions for my participation in the procedure from the performing physician.Called to room to assist during endoscopic procedure.  Patient ID and intended procedure confirmed with present staff. Received instructions for my participation in the procedure from the performing physician. 

## 2017-04-06 ENCOUNTER — Encounter: Payer: Self-pay | Admitting: *Deleted

## 2017-04-06 ENCOUNTER — Telehealth: Payer: Self-pay | Admitting: *Deleted

## 2017-04-06 ENCOUNTER — Telehealth: Payer: Self-pay

## 2017-04-06 ENCOUNTER — Ambulatory Visit: Payer: Self-pay

## 2017-04-06 NOTE — Telephone Encounter (Signed)
No phone number available for follow up call.

## 2017-04-06 NOTE — Telephone Encounter (Signed)
  Follow up Call-  Call back number 04/05/2017  Post procedure Call Back phone  # no phone available  Some recent data might be hidden   not able to call pt d/t no phone available.

## 2017-04-11 ENCOUNTER — Encounter: Payer: Self-pay | Admitting: Internal Medicine

## 2017-04-11 NOTE — Progress Notes (Signed)
Polyp not precancerous Recall 2028

## 2017-05-03 ENCOUNTER — Other Ambulatory Visit: Payer: Self-pay | Admitting: Pharmacist

## 2017-05-03 MED FILL — HARVONI 90-400 MG TABLET: 90-400 | 28 days supply | Qty: 28 | Fill #1

## 2017-05-08 ENCOUNTER — Telehealth: Payer: Self-pay | Admitting: Pharmacist Clinician (PhC)/ Clinical Pharmacy Specialist

## 2017-05-08 NOTE — Telephone Encounter (Signed)
Finally able to get in touch with her to bring back for an appt. She said that she missed about 2 days of meds. Made an appt for her to come back for labs in Jan.

## 2017-05-24 ENCOUNTER — Other Ambulatory Visit: Payer: Self-pay | Admitting: Pharmacist

## 2017-05-24 MED FILL — HARVONI 90-400 MG TABLET: 90-400 | 28 days supply | Qty: 28 | Fill #2

## 2017-06-05 ENCOUNTER — Ambulatory Visit: Payer: Self-pay

## 2017-06-08 ENCOUNTER — Ambulatory Visit (INDEPENDENT_AMBULATORY_CARE_PROVIDER_SITE_OTHER): Payer: Medicare HMO | Admitting: Pharmacist Clinician (PhC)/ Clinical Pharmacy Specialist

## 2017-06-08 DIAGNOSIS — B182 Chronic viral hepatitis C: Secondary | ICD-10-CM | POA: Diagnosis not present

## 2017-06-08 LAB — CBC
HCT: 35.6 % (ref 35.0–45.0)
Hemoglobin: 12.2 g/dL (ref 11.7–15.5)
MCH: 29.1 pg (ref 27.0–33.0)
MCHC: 34.3 g/dL (ref 32.0–36.0)
MCV: 85 fL (ref 80.0–100.0)
MPV: 14.6 fL — AB (ref 7.5–12.5)
PLATELETS: 161 10*3/uL (ref 140–400)
RBC: 4.19 10*6/uL (ref 3.80–5.10)
RDW: 13.4 % (ref 11.0–15.0)
WBC: 9.2 10*3/uL (ref 3.8–10.8)

## 2017-06-08 LAB — COMPLETE METABOLIC PANEL WITH GFR
AG Ratio: 1.3 (calc) (ref 1.0–2.5)
ALT: 15 U/L (ref 6–29)
AST: 18 U/L (ref 10–35)
Albumin: 4.2 g/dL (ref 3.6–5.1)
Alkaline phosphatase (APISO): 56 U/L (ref 33–130)
BUN: 17 mg/dL (ref 7–25)
CALCIUM: 10.2 mg/dL (ref 8.6–10.4)
CO2: 27 mmol/L (ref 20–32)
CREATININE: 1 mg/dL (ref 0.50–1.05)
Chloride: 102 mmol/L (ref 98–110)
GFR, EST NON AFRICAN AMERICAN: 62 mL/min/{1.73_m2} (ref >=60)
GFR, Est African American: 71 mL/min/{1.73_m2} (ref >=60)
GLUCOSE: 89 mg/dL (ref 65–99)
Globulin: 3.3 g/dL (calc) (ref 1.9–3.7)
Potassium: 4.2 mmol/L (ref 3.5–5.3)
Sodium: 137 mmol/L (ref 135–146)
Total Bilirubin: 0.3 mg/dL (ref 0.2–1.2)
Total Protein: 7.5 g/dL (ref 6.1–8.1)

## 2017-06-08 NOTE — Patient Instructions (Addendum)
Come back on 2/4 at 3 PM

## 2017-06-08 NOTE — Progress Notes (Signed)
HPI: Marissa Brooks is a 62 y.o. female who is finally her to f/u with pharmacy for her hep C.  Lab Results  Component Value Date   HCVGENOTYPE 1a 03/07/2017    Allergies: Allergies  Allergen Reactions  . Centrum Hives    Vitals:    Past Medical History: Past Medical History:  Diagnosis Date  . Allergy   . Anxiety   . ARF (acute renal failure) ([place]) 03/15/2015  . Arthritis   . Bipolar disorder ([place])   . Breast cancer ([place]) 09/10/14   right breast,hx left breast ca,2002 [place]  . Cancer [facility]) 2002   left breast cancer tx in [place], right breast cancer 08/2014  . Cocaine abuse ([place])    last use 10/13/14  . Depression   . ETOH abuse    as of 10/21/14 none for a week  . GERD (gastroesophageal reflux disease)   . HCAP (healthcare-associated pneumonia) 03/17/2015  . HCV (hepatitis C virus)   . Hypertension   . Lymphedema of upper extremity 08/04/2015  . 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 Pneumonia   . Shortness of breath dyspnea    with walking  . Tobacco abuse     Social History: Social History   Socioeconomic History  . Marital status: Widowed    Spouse name: Not on file  . Number of children: 0  . Years of education: 12+  . Highest education level: Not on file  Social Needs  . Financial resource strain: Not on file  . Food insecurity - worry: Not on file  . Food insecurity - inability: Not on file  . Transportation needs - medical: Not on file  . Transportation needs - non-medical: Not on file  Occupational History  . Occupation: Disabled  Tobacco Use  . Smoking status: Current Every Day Smoker    Packs/day: 0.25    Types: Cigarettes  . Smokeless tobacco: Never Used  Substance and Sexual Activity  . Alcohol use: Yes    Comment: 40 oz every other day  . Drug use: Yes    Types: Cocaine, Marijuana    Comment: last used last week; Crack 1-2 times a week, 3 beers daily, has given up marijuana lately as of 01/24/2017  .  Sexual activity: Not Currently    Birth control/protection: Abstinence  Other Topics Concern  . 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83(H) 6.83(H)    AST (U/L)  Date Value  02/28/2017 35 (H)  01/24/2017 31  08/02/2016 35 (H)  06/07/2016 28  ALT (U/L)  Date Value  03/07/2017 27  02/28/2017 38  01/24/2017 32  08/02/2016 37  06/07/2016 33   INR  Date Value  03/07/2017 1.0  01/24/2017 1.1 ratio (H)  03/15/2015 1.28  12/16/2014 1.00 (L)    CrCl: CrCl cannot be calculated (Patient's most recent lab result is older than the maximum 21 days allowed.).  Fibrosis Score: F3/4 as assessed by ARFI, F0 Fibrosure  Child-Pugh Score: Class A (maybe B?)  Previous Treatment Regimen: Naive  Assessment: Marissa Brooks is here for her hep C f/u. She was previously seen by [facility] a while back. Dr. Burr Medico mentioned in her note that she was treated at [facility] in 1999 per Gwyndolyn Kaufman? I asked her today if she has ever been treated before with Peg/Riba. She couldn't really tell me if she was every treated. I even  explained to her what med could have been involved and how it was given. She still couldn't tell me. 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 She has a discordant fibrosis score where the Fibrosure was F0 and ARFI F3/4.   She started on Harvoni on 11/7 and has missed about 3 doses due to forgetfulness. Hopefully, we can still cure her hep C. We will get labs today. Tried to give her the hep B vaccine today but we are out of supply. Going to bring her back in 2 weeks to repeat labs to make sure at the EOT visit and start the hep B at that time. Counseled on miss doses again today.  Recommendations:  Continue Harvoni 1 PO x 12 wks Hep C VL, CMP, CBC today F/u in 2 wks for EOT and hep B series F/u with Dr. Linus Salmons after the [facility], [place].D., BCPS, AAHIVP Clinical Infectious [place] for Infectious Disease 06/08/2017, 4:00 PM

## 2017-06-09 LAB — HEPATITIS C RNA QUANTITATIVE
HCV QUANT LOG: NOT DETECTED {Log_IU}/mL
HCV RNA, PCR, QN: NOT DETECTED [IU]/mL

## 2017-06-15 ENCOUNTER — Telehealth: Payer: Self-pay | Admitting: Hematology

## 2017-06-15 NOTE — Telephone Encounter (Signed)
Returned call to patient today explaining to her that appointment for 2/8 was rescheduled per Dr.Feng to 2/28.  Left message on voicemail for patient to call me back if she had any questions.

## 2017-06-26 ENCOUNTER — Ambulatory Visit: Payer: Self-pay

## 2017-06-30 ENCOUNTER — Ambulatory Visit: Payer: Self-pay | Admitting: Hematology

## 2017-06-30 ENCOUNTER — Other Ambulatory Visit: Payer: Self-pay

## 2017-07-12 NOTE — Progress Notes (Signed)
[facility]  [phone] [phone]  Clinic Follow Up Note   Patient Care Team: Elwyn Reach, MD as PCP - General (Internal Medicine) Jovita Kussmaul, MD as Consulting Physician (General Surgery) Truitt Merle, MD as Consulting Physician (Hematology) Kyung Rudd, MD as Consulting Physician (Radiation Oncology) Sylvan Cheese, NP as Nurse Practitioner (Hematology and Oncology) 07/14/2017  CHIEF COMPLAINTS:  Follow-up of breast cancer  Oncology History   Breast cancer of upper-outer quadrant of right female breast s/p Right MRM 10/30/14   Staging form: Breast, AJCC 7th Edition     Pathologic: Stage IIA (T1b, N1a, cM0) - Unsigned       Breast cancer of upper-outer quadrant of right female breast s/p Right MRM 10/30/14   06/03/2004 Cancer Diagnosis    Left breast DCIS, status post mastectomy      06/06/2014 Mammogram    Mammogram and ultrasound showed an irregular 0.7 cm mass at the 12:30 clock position of right breast. Axillary nodes were negative.      08/12/2014 Initial Biopsy    Right breast 12:30 o'clock biopsy showed invasive ductal carcinoma, grade 2. ER+ (100%), PR+ (88%), HER2/neu negative, Ki67 19%      08/12/2014 Clinical Stage    Stage IA: T1b N0      10/30/2014 Surgery    Right breast mastectomy and axillary node dissection, surgical margins were negative.       10/30/2014 Pathology Results    Right breast radical mastectomy showed invasive ductal carcinoma, grade 1, tumor measuring 1.0 cm, margins were negative lymphovascular invasion (+), 1 sentinel lymph nodes positive, 15 additional axillary nodes negative.  (+) DCIS       10/30/2014 Pathologic Stage    Stage IIA: pT1b pN1a      12/30/2014 - 03/03/2015 Chemotherapy    Docetaxel 75 mg/m, Cytoxan 600 mg/m, every 3 weeks, s/p 4 cycles       03/15/2015 - 03/22/2015 Hospital Admission    Patient was admitted for GI bleeding, required blood transfusion, EDC showed  esophagitis.      06/29/2015 - 08/07/2015 Radiation Therapy    Right chest wall 48 Gy in 24 fractions, right chest wall 1.8 Gy in 1 fractions, Right chest wall scar boost treated to 10 Gy in 5 fractions.      10/21/2015 Survivorship    Survivorship care plan mailed to patient      06/07/2016 -  Anti-estrogen oral therapy    Letrozole 2.5 mg daily, started January 2018.        HISTORY OF PRESENTING ILLNESS:  Marissa Brooks 62 y.o. female is here because of recently diagnosed right breast cancer.   This was discovered by screening mammogram in January 2016. She did not have palpable breast mass or any other symptoms. Biopsy on 08/12/2014 showed invasive ductal carcinoma, ER/PR positive, HER-2 negative. She underwent right breast mastectomy by Dr. Marcello Moores on 10/28/2014. Marland Kitchen   She has had some pain underneath her R armpit with some swelling. She reports that her "drain fell out" and was seen by Dr. Marlou Starks on 7/15 at which time a seroma was drained. Otherwise, she denies any problems with appetite, energy, pain. She does report having lost weight 195 lbs to 186 lbs though over an unspecified time interval. She is now menopausal with last menstrual period being sometime in her late 53s.  Per records obtained from [facility], she underwent left simple mastectomy on at [facility] on 07/16/04 after detection of multiple abnormalities in  the left breast on mammography and was diagnosed with DCIS. On 05/27/05, she underwent placement of left tissue expander but declined additional reconstructive surgery. On 06/15/06, she was seen by Dr. Sophronia Simas for possible adjuvant tamoxifen and was recommended to undergo close surveillance. 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   She follows with Dr. Gala Romney for PCP care and was last seen by them prior to the surgery. She is currently undergoing substance abuse rehab. She reports that 1 pack of cigarettes  lasts her a week and has not used cocaine for a month ago. She does report using heroin in the past. She also has history of bipolar for which she is on Abilify. She is widowed, has no children. She lives with a roommate who she has known for the last 10 years.  CURRENT THERAPY: Letrozole 2.5 mg daily, started January 2018.   INTERIM HISTORY  Shirlena Brinegar Walski returns for follow up. She presents to the clinic today by herself. She reports she is doing well overall. She notes she still gets intermittent sharp pains from her breast surgery. She states she takes tramadol for the pain. She is compliant with Letrozole and reports no complaints with it at this time.   On review of systems, pt denies new pain, or any other complaints at this time. Pertinent positives are listed and detailed within the above HPI.   MEDICAL HISTORY:  Past Medical History:  Diagnosis Date  . Allergy   . Anxiety   . ARF (acute renal failure) ([place]) 03/15/2015  . Arthritis   . Bipolar disorder ([place])   . Breast cancer ([place]) 09/10/14   right breast,hx left breast ca,2002 [place]  . Cancer [facility]) 2002   left breast cancer tx in [place], right breast cancer 08/2014  . Cocaine abuse ([place])    last use 10/13/14  . Depression   . ETOH abuse    as of 10/21/14 none for a week  . GERD (gastroesophageal reflux disease)   . HCAP (healthcare-associated pneumonia) 03/17/2015  . HCV (hepatitis C virus)   . Hypertension   . Lymphedema of upper extremity 08/04/2015  . 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 Pneumonia   . Shortness of breath dyspnea    with walking  . Tobacco abuse     SURGICAL HISTORY: Past Surgical History:  Procedure Laterality Date  . BREAST SURGERY  2002   left mastectomy with implant  . ESOPHAGOGASTRODUODENOSCOPY (EGD) WITH PROPOFOL N/A 03/17/2015   Procedure: ESOPHAGOGASTRODUODENOSCOPY (EGD) WITH PROPOFOL;  Surgeon: Gatha Mayer, MD;  Location: [facility];  Service:  Endoscopy;  Laterality: N/A;  . MANDIBLE FRACTURE SURGERY    . MASTECTOMY     and Implant  . MASTECTOMY W/ SENTINEL NODE BIOPSY Right 10/30/2014  . SIMPLE MASTECTOMY WITH AXILLARY SENTINEL NODE BIOPSY Right 10/30/2014   Procedure: RIGHT MASTECTOMY WITH SENTINEL NODE MAPPING;  Surgeon: Autumn Messing III, MD;  Location: [place];  Service: General;  Laterality: Right;  . TONSILLECTOMY      SOCIAL HISTORY: Social History   Socioeconomic History  . Marital status: Widowed    Spouse name: Not on file  . Number of children: 0  . Years of education: 12+  . Highest education level: Not on file  Social Needs  . Financial resource strain: Not on file  . Food insecurity - worry: Not on file  . Food insecurity - inability: Not on file  .  Transportation needs - medical: Not on file  . Transportation needs - non-medical: Not on file  Occupational History  . Occupation: Disabled  Tobacco Use  . Smoking status: Current Every Day Smoker    Packs/day: 0.25    Types: Cigarettes  . Smokeless tobacco: Never Used  Substance and Sexual Activity  . Alcohol use: Yes    Comment: 40 oz every other day  . Drug use: Yes    Types: Cocaine, Marijuana    Comment: last used last week; Crack 1-2 times a week, 3 beers daily, has given up marijuana lately as of 01/24/2017  . Sexual activity: Not Currently    Birth control/protection: Abstinence  Other Topics Concern  . 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 Mental illness Mother   . Breast cancer  Sister 53  . HIV/AIDS Sister        Deceased    ALLERGIES:  is allergic to centrum.  MEDICATIONS:  Current Outpatient Medications  Medication Sig Dispense Refill  . ARIPiprazole (ABILIFY) 5 MG tablet Take 1 tablet (5 mg total) by mouth daily. For mood control 30 tablet 0  . CALCIUM CITRATE-VITAMIN D PO Take 1 tablet by mouth daily.    Marland Kitchen letrozole (FEMARA) 2.5 MG tablet Take 1 tablet (2.5 mg total) by mouth daily. 90 tablet 3  . lisinopril (PRINIVIL,ZESTRIL) 10 MG tablet Take 10 mg by mouth daily.    . Multiple Vitamins-Minerals (MULTIVITAMIN ADULT PO) Take 1 tablet by mouth daily.    . sertraline (ZOLOFT) 100 MG tablet Take 1 tablet (100 mg total) by mouth at bedtime. For depression 30 tablet 0  . traZODone (DESYREL) 100 MG tablet Take 1 tablet (100 mg total) by mouth at bedtime. For sleep 30 tablet 0   Current Facility-Administered Medications  Medication Dose Route Frequency Provider Last Rate Last Dose  . 0.9 %  sodium chloride infusion  500 mL Intravenous Continuous Gatha Mayer, MD        REVIEW OF SYSTEMS:   Constitutional: Denies fevers, chills or abnormal night sweats Eyes: Denies blurriness of vision, double vision or watery eyes Ears, nose, mouth, throat, and face: Denies mucositis or sore throat Respiratory: Denies cough, dyspnea or wheezes Cardiovascular: Denies palpitation, chest discomfort or lower extremity swelling Gastrointestinal:  Denies nausea, heartburn or change in bowel habits Skin: Denies abnormal skin rashes Lymphatics: Denies new lymphadenopathy or easy bruising (+) R upper extremity lymphedema Neurological:Denies numbness, tingling or new weaknesses Behavioral/Psych: Mood is stable, no new changes  Breast: (+) occasional sharp pain from surgical incision  All other systems were reviewed with the patient and are negative.  PHYSICAL EXAMINATION:  ECOG PERFORMANCE STATUS: 0 - Asymptomatic  Vitals:   07/14/17 1047  BP: 130/61  Pulse: (!) 55  Resp:  16  Temp: 98.1 F (36.7 C)  SpO2: 100%   Filed Weights   07/14/17 1047  Weight: 180 lb (81.6 kg)    GENERAL:alert, no distress and comfortable SKIN: skin color, texture, turgor are  normal, no rashes or significant lesions, except multiple large healing skim rash/ulcers and pigmentation/color change on b/l front arms, L>R. EYES: normal, conjunctiva are pink and non-injected, sclera clear OROPHARYNX:no exudate, no erythema and lips, buccal mucosa, and tongue normal  NECK: supple, thyroid normal size, non-tender, without nodularity LYMPH:  no palpable lymphadenopathy in the cervical, axillary or inguinal BREAST: s/p bilateral mastectomy, s/p left implant placement. exam of the left breast, right chest wall and bilateral axilla revealed no palpable mass or adenopathy., (+) bilateral mastectomy, left silicone implant, no lymphadenopathy, otherwise negative. LUNGS: clear to auscultation and percussion with normal breathing effort HEART: regular rate & rhythm and no murmurs and no lower extremity edema ABDOMEN:abdomen soft, non-tender and normal bowel sounds Musculoskeletal:no cyanosis of digits and no clubbing  PSYCH: alert & oriented x 3 NEURO: no focal motor/sensory deficits Ext: Moderate right arm lymphedema involving the forearm and hand, right shoulder mobility is normal.  LABORATORY DATA:  I have reviewed the data as listed CBC Latest Ref Rng & Units 07/14/2017 07/13/2017 06/08/2017  WBC 3.9 - 10.3 K/uL 4.9 4.7 9.2  Hemoglobin 11.6 - 15.9 g/dL 11.9 12.4 12.2  Hematocrit 34.8 - 46.6 % 35.7 36.8 35.6  Platelets 145 - 400 K/uL 141(L) 138(L) 161    CMP Latest Ref Rng & Units 07/14/2017 07/13/2017 06/08/2017  Glucose 70 - 140 mg/dL 118 92 89  BUN 7 - 26 mg/dL _0 Creatinine 0.60 - 1.10 mg/dL 1.03 0.99 1.00  Sodium 136 - 145 mmol/L 141 137 137  Potassium 3.5 - 5.1 mmol/L 4.5 4.6 4.2  Chloride 98 - 109 mmol/L 106 104 102  CO2 22 - 29 mmol/L _1 Calcium 8.4 - 10.4 mg/dL 9.7 9.3  10.2  Total Protein 6.4 - 8.3 g/dL 7.6 7.2 7.5  Total Bilirubin 0.2 - 1.2 mg/dL 0.2 0.3 0.3  Alkaline Phos 40 - 150 U/L 62 - -  AST 5 - 34 U/L _2 ALT 0 - 55 U/L _3 PATHOLOGY REPORT 10/30/2014 ADDITIONAL INFORMATION: 2. FLUORESCENCE IN-SITU HYBRIDIZATION  Results: HER2 - NEGATIVE RATIO OF HER2/CEP17 SIGNALS 1.62 AVERAGE HER2 COPY NUMBER PER CELL 2.10 Reference Range: NEGATIVE HER2/CEP17 Ratio <2.0 and average HER2 copy number <4.0 EQUIVOCAL HER2/CEP17 Ratio <2.0 and average HER2 copy number 4.0 and <6.0 POSITIVE HER2/CEP17 Ratio >=2.0 or <2.0 and average HER2 copy number >=6.0 [place] RUND DO Pathologist, Electronic Signature ( Signed 11/26/2014) 2. her2 This is NOT signed out FINAL DIAGNOSIS Diagnosis 1. Lymph node, sentinel, biopsy, Right #1 - ONE LYMPH NODE, POSITIVE FOR METASTATIC MAMMARY CARCINOMA (1/1). - INTRANODAL TUMOR DEPOSIT IS 1.2 CM - POSITIVE FOR EXTRACAPSULAR TUMOR EXTENSION. 2. Breast, radical mastectomy (including lymph nodes), Right and axillary contents - INVASIVE DUCTAL CARCINOMA, SEE COMMENT. - POSITIVE FOR LYMPH VASCULAR INVASION. - DUCTAL CARCINOMA IN SITU WITH NECROSIS AND CALCIFICATIONS. - FIFTEEN LYMPH NODE S, NEGATIVE FOR TUMOR (0/15). - PREVIOUS BIOPSY SITE. - SEE TUMOR SYNOPTIC TEMPLATE BELOW  Microscopic Comment 2. BREAST, INVASIVE TUMOR, WITH LYMPH NODES PRESENT Specimen, including laterality and lymph node sampling (sentinel, non-sentinel): Right breast with sentinel lymph node sampling Procedure: Radical mastectomy Histologic type: Ductal Grade: I of III Tubule formation: 2 Nuclear pleomorphism: 2 Mitotic: 1 Tumor size (gross measurement): 1.0 cm Margins: Invasive, distance to closest margin: 1.8 cm (posterior) In-situ, distance to closest margin: 1.8 cm (posterior) If margin positive, focally or broadly: N/A Lymphovascular invasion: Present Ductal carcinoma in situ: Present Grade: 2 of 3  Extensive intraductal  component: Absent Lobular neoplasia: Absent Tumor focality: Unifocal Treatment effect: None If present, treatment effect in breast tissue, lymph nodes or both: N/A Extent of tumor: Skin: N/A Nipple: N/A Skeletal muscle: N/A Lymph nodes: Examined: 1 Sentinel 15 Non-sentinel 16 Total Lymph nodes with metastasis: 1 Isolated tumor cells (< 0.2 mm): 0 Micrometastasis: (> 0.2 mm and < 2.0 mm): 0 Macrometastasis: (> 2.0 mm): x 1 Extracapsular extension: Present Breast prognostic profile: Estrogen receptor: Not repeated, previous study demonstrated 100% positivity ([id number]) Progesterone receptor: Not repeated, previous study demonstrated 88% positivity ([id number]) Her 2 neu: Repeated, previous study demonstrated no amplification ([id number]) Ki-67: Not repeated, previous study demonstrated 19% proliferation rate ([id number]) Non-neoplastic breast: Previous biopsy site, fibrocystic change, columnar cell change, sclerosing adenosis, benign adenosis and calcifications TNM: p T1b, pN1a, pMX   RADIOGRAPHIC STUDIES: I have personally reviewed the radiological images as listed and agreed with the findings in the report. No results found.  ASSESSMENT & PLAN:  Ms. Schall is a 62 y.o. female with h/o left DCIS s/p mastectomy who presents with R invasive ductal carcinoma s/p mastectomy  1. Stage IIa, pT1bpN1a,M0, right invasive ductal carcinoma s/p mastectomy, ER+/PR+/HER- , G1 -Discussed with the patient the results of her surgical path and her cancer staging -The natural history of breast cancer were reviewed with her, and moderate risk of cancer recurrence after her surgery, giving the note positive disease. -She has now completed adjuvant chemotherapy, and adjuvant radiation. -She did not get a breast reconstruction, but uses a breast prosthesis instead.  -she has started adjuvant letrozole, but it was delayed for 10 months after she completed RT, due to her non-compliance and loss of  f/u.  She started in 05/2016. -She is tolerating letrozole well, except a mild arthralgia, we will continue, plan for 5-7 years. -Continue breast cancer surveillance. She has has a bilateral mastectomy, no need for routine screening mammogram. I again encouraged her to self exam, and a follow-up with [place] routinely with lab and exam. -She is clinically doing well, labs reviwed and thrombocytopenia is still present. heH exam are unremarkable today, no concern for for breast Cancer recurrence. -F/u in 6 months   2. Genetics -Giving her history of breast cancer twice, and family history (sister) of breast cancer, I recommended genetic counseling but she declined.   -She does not have children, but does have sisters.  3. History of cirrhosis,  HCV infection, Hepatitis C -No imaging or additional workup found in our system though noted to have undergone treatment for HCV in 1999 at [facility] per note by Aurora Mask  [06/02/09] -Most recent LFTs reassuring -Finished Hep C treatment with Dr. Linus Salmons, follow up appointment is in 09/2017  4. Polysubstance abuse  -Recently hospitalized in February 2016 for substance-induced mood disorder. -Continue to encourage patient to continue with substance rehab and avoid illicit substances -She agrees to quit alcohol and illicit drug (cocaine) completely when she is on chemo, she will work on smoking cessation also -Her urine test was still positive for cocaine on August 9. -The patient can use Tylenol as needed for pain, I will not refill her tramadol.  5. Hypertension:  -Continue follow-up with her primary care physician.  6. Mood disorder:  -She does not follow with a psychiatrist and reports her PCP prescribes all of her psychiatric medications.  -Defer to PCP  7. Right arm lymph edema  -continue PT exercises at home -wears compression sleeve.  8. Cancer screening -she is scheduled to have  a screening colonoscopy on 04/05/2017, I previously  encouraged her to keep his appointment -Her colonoscopy revealed one diminutive polyp in the sigmoid colon that was resected and retrieved. The examination was otherwise normal  Plan: -Continue letrozole. -DEXA soon -Lab and f/u in 6 months  -do not refill tramadol   This document serves as a record of services personally performed by Truitt Merle, MD. It was created on her behalf by Theresia Bough, a trained medical scribe. The creation of this record is based on the scribe's personal observations and the provider's statements to them.   I have reviewed the above documentation for accuracy and completeness, and I agree with the above.   Truitt Merle  07/14/17 11:24 AM

## 2017-07-13 ENCOUNTER — Ambulatory Visit (INDEPENDENT_AMBULATORY_CARE_PROVIDER_SITE_OTHER): Payer: Medicare HMO | Admitting: Pharmacist Clinician (PhC)/ Clinical Pharmacy Specialist

## 2017-07-13 DIAGNOSIS — Z23 Encounter for immunization: Secondary | ICD-10-CM | POA: Diagnosis not present

## 2017-07-13 DIAGNOSIS — B182 Chronic viral hepatitis C: Secondary | ICD-10-CM | POA: Diagnosis not present

## 2017-07-13 LAB — COMPLETE METABOLIC PANEL WITH GFR
AG Ratio: 1.3 (calc) (ref 1.0–2.5)
ALKALINE PHOSPHATASE (APISO): 55 U/L (ref 33–130)
ALT: 15 U/L (ref 6–29)
AST: 15 U/L (ref 10–35)
Albumin: 4 g/dL (ref 3.6–5.1)
BUN: 16 mg/dL (ref 7–25)
CALCIUM: 9.3 mg/dL (ref 8.6–10.4)
CO2: 27 mmol/L (ref 20–32)
CREATININE: 0.99 mg/dL (ref 0.50–1.05)
Chloride: 104 mmol/L (ref 98–110)
GFR, Est African American: 72 mL/min/{1.73_m2} (ref >=60)
GFR, Est Non African American: 62 mL/min/{1.73_m2} (ref >=60)
GLOBULIN: 3.2 g/dL (ref 1.9–3.7)
Glucose, Bld: 92 mg/dL (ref 65–99)
Potassium: 4.6 mmol/L (ref 3.5–5.3)
Sodium: 137 mmol/L (ref 135–146)
Total Bilirubin: 0.3 mg/dL (ref 0.2–1.2)
Total Protein: 7.2 g/dL (ref 6.1–8.1)

## 2017-07-13 LAB — CBC
HEMATOCRIT: 36.8 % (ref 35.0–45.0)
HEMOGLOBIN: 12.4 g/dL (ref 11.7–15.5)
MCH: 28.8 pg (ref 27.0–33.0)
MCHC: 33.7 g/dL (ref 32.0–36.0)
MCV: 85.4 fL (ref 80.0–100.0)
MPV: 14.5 fL — ABNORMAL HIGH (ref 7.5–12.5)
Platelets: 138 10*3/uL — ABNORMAL LOW (ref 140–400)
RBC: 4.31 10*6/uL (ref 3.80–5.10)
RDW: 13.2 % (ref 11.0–15.0)
WBC: 4.7 10*3/uL (ref 3.8–10.8)

## 2017-07-13 NOTE — Progress Notes (Addendum)
HPI: Marissa Brooks is a 62 y.o. female who is here for her EOT hep C visit.   Lab Results  Component Value Date   HCVGENOTYPE 1a 03/07/2017    Allergies: Allergies  Allergen Reactions  . Centrum Hives    Vitals:    Past Medical History: Past Medical History:  Diagnosis Date  . Allergy   . Anxiety   . ARF (acute renal failure) ([place]) 03/15/2015  . Arthritis   . Bipolar disorder ([place])   . Breast cancer ([place]) 09/10/14   right breast,hx left breast ca,2002 [place]  . Cancer [facility]) 2002   left breast cancer tx in [place], right breast cancer 08/2014  . Cocaine abuse ([place])    last use 10/13/14  . Depression   . ETOH abuse    as of 10/21/14 none for a week  . GERD (gastroesophageal reflux disease)   . HCAP (healthcare-associated pneumonia) 03/17/2015  . HCV (hepatitis C virus)   . Hypertension   . Lymphedema of upper extremity 08/04/2015  . 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 Pneumonia   . Shortness of breath dyspnea    with walking  . Tobacco abuse     Social History: Social History   Socioeconomic History  . Marital status: Widowed    Spouse name: Not on file  . Number of children: 0  . Years of education: 12+  . Highest education level: Not on file  Social Needs  . Financial resource strain: Not on file  . Food insecurity - worry: Not on file  . Food insecurity - inability: Not on file  . Transportation needs - medical: Not on file  . Transportation needs - non-medical: Not on file  Occupational History  . Occupation: Disabled  Tobacco Use  . Smoking status: Current Every Day Smoker    Packs/day: 0.25    Types: Cigarettes  . Smokeless tobacco: Never Used  Substance and Sexual Activity  . Alcohol use: Yes    Comment: 40 oz every other day  . Drug use: Yes    Types: Cocaine, Marijuana    Comment: last used last week; Crack 1-2 times a week, 3 beers daily, has given up marijuana lately as of 01/24/2017  . Sexual activity:  Not Currently    Birth control/protection: Abstinence  Other Topics Concern  . Not on file  Social History Narrative   Widowed no children   She really is with her nieces female cousin in [facility]   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[employer], domestic and custodial work   Transport planner via program through [employer]   01/24/2017       Labs: Hep B S Ab (no units)  Date Value  03/07/2017 NON-REACTIVE   Hepatitis B Surface Ag (no units)  Date Value  03/07/2017 NON-REACTIVE   HCV Ab (no units)  Date Value  12/16/2014 REACTIVE (A)    Lab Results  Component Value Date   HCVGENOTYPE 1a 03/07/2017    Hepatitis C RNA quantitative Latest Ref Rng & Units 06/08/2017 01/24/2017 12/16/2014 12/16/2014  HCV Quantitative NOT DETECTED IU/mL - 3,470,000(H) 2,426,834(H) 9,622,297(L)  HCV Quantitative Log NOT DETECT Log IU/mL <1.18 NOT DETECTED 6.54(H) 6.83(H) 6.83(H)    AST (U/L)  Date Value  06/08/2017 18  02/28/2017 35 (H)  01/24/2017 31  08/02/2016 35 (  H)  06/07/2016 28   ALT (U/L)  Date Value  06/08/2017 15  03/07/2017 27  02/28/2017 38  01/24/2017 32  08/02/2016 37  06/07/2016 33   INR  Date Value  03/07/2017 1.0  01/24/2017 1.1 ratio (H)  03/15/2015 1.28  12/16/2014 1.00 (L)    CrCl: CrCl cannot be calculated (Patient's most recent lab result is older than the maximum 21 days allowed.).  Fibrosis Score: F3/4 as assessed by ARFI  Child-Pugh Score: Class A (B?)  Previous Treatment Regimen: None  Assessment: Marissa Brooks finished her 12 weeks of Harvoni early Feb. She missed the last appt with [place]. She is here today so we can do the EOT labs on here. Through the course, she missed about 3 doses. She has F3/4 on the elastography. Her plts were <150k before therapy. It came up to 161k during  therapy. We will repeat it today. Scheduled to come back for the [facility] before the cured visit with Dr. Linus Salmons.   We couldn't give her the Hep B vaccine at the last visit because of supply. We will start it today. She will come back for the second shot next month and 3rd one in August.   Recommendations:  Hep C VL, CMP, CBC today Hep B #1 today F/u next month for hep B vaccine SVR12 then cure visit with Dr. Linus Salmons F/u in August for last hep [place], Pharm.D., BCPS, AAHIVP Clinical Infectious [place] [facility] 07/13/2017, 9:32 AM

## 2017-07-14 ENCOUNTER — Telehealth: Payer: Self-pay | Admitting: Hematology

## 2017-07-14 ENCOUNTER — Inpatient Hospital Stay (HOSPITAL_BASED_OUTPATIENT_CLINIC_OR_DEPARTMENT_OTHER): Payer: Medicare HMO | Admitting: Hematology

## 2017-07-14 ENCOUNTER — Encounter: Payer: Self-pay | Admitting: Hematology

## 2017-07-14 ENCOUNTER — Inpatient Hospital Stay: Payer: Medicare HMO | Attending: Hematology

## 2017-07-14 VITALS — BP 130/61 | HR 55 | Temp 98.1°F | Resp 16 | Ht 62.0 in | Wt 180.0 lb

## 2017-07-14 DIAGNOSIS — T451X5A Adverse effect of antineoplastic and immunosuppressive drugs, initial encounter: Secondary | ICD-10-CM

## 2017-07-14 DIAGNOSIS — B182 Chronic viral hepatitis C: Secondary | ICD-10-CM

## 2017-07-14 DIAGNOSIS — C50411 Malignant neoplasm of upper-outer quadrant of right female breast: Secondary | ICD-10-CM | POA: Diagnosis present

## 2017-07-14 DIAGNOSIS — Z170 Estrogen receptor positive status [ER+]: Secondary | ICD-10-CM

## 2017-07-14 DIAGNOSIS — Z720 Tobacco use: Secondary | ICD-10-CM

## 2017-07-14 DIAGNOSIS — F319 Bipolar disorder, unspecified: Secondary | ICD-10-CM

## 2017-07-14 DIAGNOSIS — I890 Lymphedema, not elsewhere classified: Secondary | ICD-10-CM

## 2017-07-14 DIAGNOSIS — G620 Drug-induced polyneuropathy: Secondary | ICD-10-CM

## 2017-07-14 DIAGNOSIS — I10 Essential (primary) hypertension: Secondary | ICD-10-CM

## 2017-07-14 LAB — COMPREHENSIVE METABOLIC PANEL
ALT: 17 U/L (ref 0–55)
ANION GAP: 9 (ref 3–11)
AST: 17 U/L (ref 5–34)
Albumin: 3.6 g/dL (ref 3.5–5.0)
Alkaline Phosphatase: 62 U/L (ref 40–150)
BILIRUBIN TOTAL: 0.2 mg/dL (ref 0.2–1.2)
BUN: 13 mg/dL (ref 7–26)
CO2: 26 mmol/L (ref 22–29)
Calcium: 9.7 mg/dL (ref 8.4–10.4)
Chloride: 106 mmol/L (ref 98–109)
Creatinine, Ser: 1.03 mg/dL (ref 0.60–1.10)
GFR, EST NON AFRICAN AMERICAN: 58 mL/min — AB (ref >60)
Glucose, Bld: 118 mg/dL (ref 70–140)
POTASSIUM: 4.5 mmol/L (ref 3.5–5.1)
Sodium: 141 mmol/L (ref 136–145)
TOTAL PROTEIN: 7.6 g/dL (ref 6.4–8.3)

## 2017-07-14 LAB — CBC WITH DIFFERENTIAL/PLATELET
BASOS ABS: 0 10*3/uL (ref 0.0–0.1)
Basophils Relative: 1 %
EOS PCT: 2 %
Eosinophils Absolute: 0.1 10*3/uL (ref 0.0–0.5)
HCT: 35.7 % (ref 34.8–46.6)
Hemoglobin: 11.9 g/dL (ref 11.6–15.9)
LYMPHS PCT: 25 %
Lymphs Abs: 1.2 10*3/uL (ref 0.9–3.3)
MCH: 29.1 pg (ref 25.1–34.0)
MCHC: 33.4 g/dL (ref 31.5–36.0)
MCV: 87.3 fL (ref 79.5–101.0)
Monocytes Absolute: 0.3 10*3/uL (ref 0.1–0.9)
Monocytes Relative: 6 %
NEUTROS PCT: 66 %
Neutro Abs: 3.3 10*3/uL (ref 1.5–6.5)
PLATELETS: 141 10*3/uL — AB (ref 145–400)
RBC: 4.08 MIL/uL (ref 3.70–5.45)
RDW: 13.7 % (ref 11.2–14.5)
WBC: 4.9 10*3/uL (ref 3.9–10.3)

## 2017-07-14 MED ORDER — LETROZOLE 2.5 MG PO TABS: 2.5000 mg | ORAL_TABLET | Freq: Every day | ORAL | 3 refills | Status: DC

## 2017-07-14 NOTE — Telephone Encounter (Signed)
Scheduled appt per 2/22 los - Gave patient AVS and calender per los.  

## 2017-07-16 LAB — HEPATITIS C RNA QUANTITATIVE
HCV Quantitative Log: <1.18 Log IU/mL
HCV RNA, PCR, QN: <15 IU/mL

## 2017-08-10 ENCOUNTER — Ambulatory Visit: Payer: Self-pay

## 2017-09-27 ENCOUNTER — Telehealth: Payer: Self-pay

## 2017-09-27 NOTE — Telephone Encounter (Signed)
Patient called desiring to speak to nurse, returned her call at [phone] no answer, left voice message.

## 2017-09-28 ENCOUNTER — Telehealth: Payer: Self-pay | Admitting: *Deleted

## 2017-09-28 ENCOUNTER — Telehealth: Payer: Self-pay

## 2017-09-28 NOTE — Telephone Encounter (Signed)
Letrozole is the only medication I prescribe for her now (I have stopped refill of tramadol). Please make sure she will take letrozole when she is in rehab. Please reach out her niece of needed.   Truitt Merle MD

## 2017-09-28 NOTE — Telephone Encounter (Signed)
Patient called stating it was important for nurse to call her back. Called her back x 2 no answer, left voice message for her to call back.  She left no details of why she was calling.

## 2017-09-28 NOTE — Telephone Encounter (Signed)
FYI Call received from [employer].  "I need help because I'm having a problem identifying a medication I should or was taking."   (Niece assisting caller)  "My aunt is trying to start rehab for recovery.  Her records pulled show 'Lipitor'.  Admission will not be allowed because she does not have all her medications.  She does not have or ever taken Lipitor.  Check her records to see if Dr. Burr Medico ordered [place]? Could you fax her medication information to the [facility] to clarify/verify no Lipitor has ever been prescribed?"   No information provided to this nurse request for Rehab Center's name, source of records center possesses to ensure [place] infornation is consistent and compatible.    Call ended [facility] record release information to sign release at rehab center to allow center to request records from [facility] or  [facility] to release [facility] records to rehab.  "I've got it from here."   Lipitor not observed in EPIC current outpatient medication list.

## 2017-10-10 ENCOUNTER — Other Ambulatory Visit: Payer: Self-pay

## 2017-10-17 ENCOUNTER — Ambulatory Visit: Payer: Self-pay | Admitting: Internal Medicine

## 2018-01-11 ENCOUNTER — Inpatient Hospital Stay: Payer: Medicare HMO

## 2018-01-11 ENCOUNTER — Inpatient Hospital Stay: Payer: Medicare HMO | Attending: Hematology | Admitting: Hematology

## 2018-01-11 ENCOUNTER — Ambulatory Visit: Payer: Self-pay

## 2018-01-30 ENCOUNTER — Ambulatory Visit: Payer: Self-pay

## 2018-02-19 ENCOUNTER — Telehealth: Payer: Self-pay | Admitting: Hematology

## 2018-02-19 NOTE — Telephone Encounter (Signed)
Tried to call it could not be completed at the time  °

## 2018-03-01 ENCOUNTER — Ambulatory Visit: Payer: Self-pay

## 2018-04-30 ENCOUNTER — Inpatient Hospital Stay: Payer: Medicare HMO | Attending: Hematology | Admitting: Hematology

## 2018-04-30 ENCOUNTER — Inpatient Hospital Stay: Payer: Medicare HMO

## 2018-05-02 ENCOUNTER — Ambulatory Visit: Payer: Self-pay | Admitting: Internal Medicine

## 2018-05-19 ENCOUNTER — Other Ambulatory Visit: Payer: Self-pay | Admitting: Hematology

## 2018-05-19 DIAGNOSIS — Z170 Estrogen receptor positive status [ER+]: Principal | ICD-10-CM

## 2018-05-19 DIAGNOSIS — C50411 Malignant neoplasm of upper-outer quadrant of right female breast: Secondary | ICD-10-CM

## 2018-05-22 ENCOUNTER — Telehealth: Payer: Self-pay | Admitting: Hematology

## 2018-05-22 NOTE — Telephone Encounter (Signed)
Tried to call patient per 12/31 sch message - unable to reach patient - message given that call can not be completed.

## 2018-05-29 ENCOUNTER — Ambulatory Visit: Payer: Medicare HMO | Admitting: Internal Medicine

## 2018-05-29 ENCOUNTER — Telehealth: Payer: Self-pay | Admitting: Hematology

## 2018-05-29 NOTE — Telephone Encounter (Signed)
Called patient per 1/7 sch message - called patient no answer and no vmail to leave message.- tried emergency contact and they said they will try to relay the message we have tried to call patient .

## 2018-06-04 ENCOUNTER — Telehealth: Payer: Self-pay | Admitting: Hematology

## 2018-06-04 NOTE — Telephone Encounter (Signed)
Returned call re needing an appointment. Rescheduled missed appointment from December. Patient has long history of no shows - states she could not reach office. Patient given my direct call back number to contact office is she cannot keep appointment.

## 2018-06-05 ENCOUNTER — Encounter: Payer: Self-pay | Admitting: Internal Medicine

## 2018-06-05 ENCOUNTER — Other Ambulatory Visit: Payer: Self-pay | Admitting: Internal Medicine

## 2018-06-05 ENCOUNTER — Ambulatory Visit (INDEPENDENT_AMBULATORY_CARE_PROVIDER_SITE_OTHER): Payer: Medicare HMO | Admitting: Internal Medicine

## 2018-06-05 ENCOUNTER — Telehealth: Payer: Self-pay

## 2018-06-05 VITALS — BP 137/82 | HR 55 | Temp 97.8°F | Wt 187.0 lb

## 2018-06-05 DIAGNOSIS — F1020 Alcohol dependence, uncomplicated: Secondary | ICD-10-CM

## 2018-06-05 DIAGNOSIS — K7460 Unspecified cirrhosis of liver: Secondary | ICD-10-CM

## 2018-06-05 DIAGNOSIS — Z23 Encounter for immunization: Secondary | ICD-10-CM | POA: Insufficient documentation

## 2018-06-05 DIAGNOSIS — B182 Chronic viral hepatitis C: Secondary | ICD-10-CM

## 2018-06-05 NOTE — Telephone Encounter (Signed)
Spoke with patient and verified new appointment was set by M.D. on 1/13 and update contact information.per 1/14 return voice msg

## 2018-06-05 NOTE — Assessment & Plan Note (Signed)
Will check her SVR 24 today and if negative, considered cured.

## 2018-06-05 NOTE — Assessment & Plan Note (Signed)
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  Will do [place] screening with ultrasound every 6 months. Due now.  rtc 6 months.

## 2018-06-05 NOTE — Progress Notes (Signed)
   Subjective:    Patient ID: Marissa Brooks, female    DOB: 10-16-1955, 63 y.o.   MRN: [id number]  HPI Here for follow up of chronic hepatitis C Has genotype 1 and treated with 12 weeks of Harvoni.  No issues during treatment and EOT lab undetectable.  No new issues.  Initial platelets < 150 and elastography F3/4.  Started hepatitis B series.     Review of Systems  Constitutional: Negative for chills and fever.  Gastrointestinal: Negative for diarrhea and nausea.  Skin: Negative for rash.       Objective:   Physical Exam Constitutional:      Appearance: Normal appearance.  Eyes:     General: No scleral icterus. Cardiovascular:     Rate and Rhythm: Normal rate and regular rhythm.     Heart sounds: No murmur.  Pulmonary:     Effort: Pulmonary effort is normal. No respiratory distress.     Breath sounds: Normal breath sounds.  Skin:    Findings: No rash.  Neurological:     Mental Status: She is alert.  Psychiatric:        Mood and Affect: Mood normal.   SH: + tobacco and alcohol        Assessment & Plan:

## 2018-06-05 NOTE — Assessment & Plan Note (Signed)
Discussed the hepatitis B series and #2 given today

## 2018-06-05 NOTE — Assessment & Plan Note (Signed)
I discussed the importance of stopping alcohol.

## 2018-06-07 LAB — COMPLETE METABOLIC PANEL WITH GFR
AG Ratio: 1.4 (calc) (ref 1.0–2.5)
ALKALINE PHOSPHATASE (APISO): 63 U/L (ref 33–130)
ALT: 15 U/L (ref 6–29)
AST: 16 U/L (ref 10–35)
Albumin: 4.4 g/dL (ref 3.6–5.1)
BUN/Creatinine Ratio: 15 (calc) (ref 6–22)
BUN: 19 mg/dL (ref 7–25)
CO2: 25 mmol/L (ref 20–32)
Calcium: 9.5 mg/dL (ref 8.6–10.4)
Chloride: 103 mmol/L (ref 98–110)
Creat: 1.24 mg/dL — ABNORMAL HIGH (ref 0.50–0.99)
GFR, Est African American: 55 mL/min/{1.73_m2} — ABNORMAL LOW (ref >=60)
GFR, Est Non African American: 47 mL/min/{1.73_m2} — ABNORMAL LOW (ref >=60)
Globulin: 3.2 g/dL (calc) (ref 1.9–3.7)
Glucose, Bld: 101 mg/dL — ABNORMAL HIGH (ref 65–99)
POTASSIUM: 4.4 mmol/L (ref 3.5–5.3)
Sodium: 138 mmol/L (ref 135–146)
Total Bilirubin: 0.3 mg/dL (ref 0.2–1.2)
Total Protein: 7.6 g/dL (ref 6.1–8.1)

## 2018-06-07 LAB — HEPATITIS C RNA QUANTITATIVE
HCV Quantitative Log: <1.18 Log IU/mL
HCV RNA, PCR, QN: NOT DETECTED [IU]/mL

## 2018-06-12 ENCOUNTER — Inpatient Hospital Stay: Payer: Medicare HMO | Admitting: Hematology

## 2018-06-12 ENCOUNTER — Inpatient Hospital Stay: Payer: Medicare HMO

## 2018-06-12 ENCOUNTER — Telehealth: Payer: Self-pay | Admitting: Hematology

## 2018-06-12 NOTE — Telephone Encounter (Signed)
R/s appt per 1/21 sch message - pt is aware of new appt date and time

## 2018-06-14 ENCOUNTER — Ambulatory Visit (HOSPITAL_COMMUNITY): Payer: Self-pay

## 2018-06-19 ENCOUNTER — Inpatient Hospital Stay: Payer: Medicare HMO

## 2018-06-19 ENCOUNTER — Inpatient Hospital Stay: Payer: Medicare HMO | Admitting: Hematology

## 2018-06-19 ENCOUNTER — Telehealth: Payer: Self-pay | Admitting: Hematology

## 2018-06-19 NOTE — Telephone Encounter (Signed)
Called patient per 1/28 sch message - unable to reach patient - left message for patient to call back to r/s

## 2018-06-20 ENCOUNTER — Ambulatory Visit (HOSPITAL_COMMUNITY)
Admission: RE | Admit: 2018-06-20 | Discharge: 2018-06-20 | Disposition: A | Discharge status: Home / Self Care | Payer: Medicare HMO | Source: Ambulatory Visit | Attending: Internal Medicine | Admitting: Internal Medicine

## 2018-06-20 DIAGNOSIS — K7460 Unspecified cirrhosis of liver: Secondary | ICD-10-CM | POA: Diagnosis present

## 2018-06-27 ENCOUNTER — Inpatient Hospital Stay: Payer: Medicare HMO | Admitting: Hematology

## 2018-06-27 ENCOUNTER — Inpatient Hospital Stay: Payer: Medicare HMO

## 2018-06-28 ENCOUNTER — Telehealth: Payer: Self-pay | Admitting: Hematology

## 2018-06-28 NOTE — Telephone Encounter (Signed)
R/s appt per 2/5 sch message- pt is aware of appt date and time   

## 2018-07-05 NOTE — Progress Notes (Signed)
[facility]   Telephone:([phone]) [phone] Fax:([phone]) [phone]   Clinic Follow up Note   Patient Care Team: Elwyn Reach, MD as PCP - General (Internal Medicine) Jovita Kussmaul, MD as Consulting Physician (General Surgery) Truitt Merle, MD as Consulting Physician (Hematology) Kyung Rudd, MD as Consulting Physician (Radiation Oncology) Sylvan Cheese, NP as Nurse Practitioner (Hematology and Oncology) 07/10/2018  CHIEF COMPLAINT: F/u breast cancer   SUMMARY OF ONCOLOGIC HISTORY: Oncology History   Breast cancer of upper-outer quadrant of right female breast s/p Right MRM 10/30/14   Staging form: Breast, AJCC 7th Edition     Pathologic: Stage IIA (T1b, N1a, cM0) - Unsigned       Breast cancer of upper-outer quadrant of right female breast s/p Right MRM 10/30/14   06/03/2004 Cancer Diagnosis    Left breast DCIS, status post mastectomy    06/06/2014 Mammogram    Mammogram and ultrasound showed an irregular 0.7 cm mass at the 12:30 clock position of right breast. Axillary nodes were negative.    08/12/2014 Initial Biopsy    Right breast 12:30 o'clock biopsy showed invasive ductal carcinoma, grade 2. ER+ (100%), PR+ (88%), HER2/neu negative, Ki67 19%    08/12/2014 Clinical Stage    Stage IA: T1b N0    10/30/2014 Surgery    Right breast mastectomy and axillary node dissection, surgical margins were negative.     10/30/2014 Pathology Results    Right breast radical mastectomy showed invasive ductal carcinoma, grade 1, tumor measuring 1.0 cm, margins were negative lymphovascular invasion (+), 1 sentinel lymph nodes positive, 15 additional axillary nodes negative.  (+) DCIS     10/30/2014 Pathologic Stage    Stage IIA: pT1b pN1a    12/30/2014 - 03/03/2015 Chemotherapy    Docetaxel 75 mg/m, Cytoxan 600 mg/m, every 3 weeks, s/p 4 cycles     03/15/2015 - 03/22/2015 Hospital Admission    Patient was admitted for GI bleeding, required blood transfusion, EDC showed  esophagitis.    06/29/2015 - 08/07/2015 Radiation Therapy    Right chest wall 48 Gy in 24 fractions, right chest wall 1.8 Gy in 1 fractions, Right chest wall scar boost treated to 10 Gy in 5 fractions.    10/21/2015 Survivorship    Survivorship care plan mailed to patient    06/07/2016 -  Anti-estrogen oral therapy    Letrozole 2.5 mg daily, started January 2018.     CURRENT THERAPY   Letrozole 2.5 mg daily, started January 2018.   INTERVAL HISTORY: Marissa Brooks is a 63 y.o. female who is here for follow-up. Today, she is here alone. She lives in [place] alone. She is still part of a  drug rehab program and meets with her counselor 3 times a week. She fell down the stairs and both of her knees hit concrete. Since then, her knees have been hurting.Today her right knee hurts but it alternates between her left and right knee. Her pain is improved by using a knee brace and taking Tylenol. She describes intermittent shooting pain at night time across her right breast. She wants to remove her left breast implant.     Pertinent positives and negatives of review of systems are listed and detailed within the above HPI.  REVIEW OF SYSTEMS:   Constitutional: Denies fevers, chills or abnormal weight loss,  Eyes: Denies blurriness of vision Ears, nose, mouth, throat, and face: Denies mucositis or sore throat Respiratory: Denies cough, dyspnea or wheezes Cardiovascular: Denies palpitation, chest  discomfort or lower extremity swelling Gastrointestinal:  Denies nausea, heartburn or change in bowel habits Skin: Denies abnormal skin rashes Lymphatics: Denies new lymphadenopathy or easy bruising Neurological:Denies numbness, tingling or new weaknesses Behavioral/Psych: Mood is stable, no new changes  Breast: (+) shooting pain in right breast  Musculoskeletal: (+) knee pain that alternates between right and left knee  All other systems were reviewed with the patient and are negative.  MEDICAL HISTORY:   Past Medical History:  Diagnosis Date  . Allergy   . Anxiety   . ARF (acute renal failure) ([place]) 03/15/2015  . Arthritis   . Bipolar disorder ([place])   . Breast cancer ([place]) 09/10/14   right breast,hx left breast ca,2002 [place]  . Cancer [facility]) 2002   left breast cancer tx in [place], right breast cancer 08/2014  . Cocaine abuse ([place])    last use 10/13/14  . Depression   . ETOH abuse    as of 10/21/14 none for a week  . GERD (gastroesophageal reflux disease)   . HCAP (healthcare-associated pneumonia) 03/17/2015  . HCV (hepatitis C virus)   . Hypertension   . Lymphedema of upper extremity 08/04/2015  . 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 Pneumonia   . Shortness of breath dyspnea    with walking  . Tobacco abuse     SURGICAL HISTORY: Past Surgical History:  Procedure Laterality Date  . BREAST SURGERY  2002   left mastectomy with implant  . ESOPHAGOGASTRODUODENOSCOPY (EGD) WITH PROPOFOL N/A 03/17/2015   Procedure: ESOPHAGOGASTRODUODENOSCOPY (EGD) WITH PROPOFOL;  Surgeon: Gatha Mayer, MD;  Location: [facility];  Service: Endoscopy;  Laterality: N/A;  . MANDIBLE FRACTURE SURGERY    . MASTECTOMY     and Implant  . MASTECTOMY W/ SENTINEL NODE BIOPSY Right 10/30/2014  . SIMPLE MASTECTOMY WITH AXILLARY SENTINEL NODE BIOPSY Right 10/30/2014   Procedure: RIGHT MASTECTOMY WITH SENTINEL NODE MAPPING;  Surgeon: Autumn Messing III, MD;  Location: [facility];  Service: General;  Laterality: Right;  . TONSILLECTOMY      I have reviewed the social history and family history with the patient and they are unchanged from previous note.  ALLERGIES:  is allergic to centrum.  MEDICATIONS:  Current Outpatient Medications  Medication Sig Dispense Refill  . ARIPiprazole (ABILIFY) 5 MG tablet Take 1 tablet (5 mg total) by mouth daily. For mood control 30 tablet 0  . CALCIUM CITRATE-VITAMIN D PO Take 1 tablet by mouth daily.    Marland Kitchen letrozole (FEMARA) 2.5 MG tablet TAKE  1 TABLET (2.5 MG TOTAL) BY MOUTH DAILY. 90 tablet 2  . lisinopril (PRINIVIL,ZESTRIL) 10 MG tablet Take 10 mg by mouth daily.    . Multiple Vitamins-Minerals (MULTIVITAMIN ADULT PO) Take 1 tablet by mouth daily.    . sertraline (ZOLOFT) 100 MG tablet Take 1 tablet (100 mg total) by mouth at bedtime. For depression 30 tablet 0  . traMADol (ULTRAM) 50 MG tablet Take by mouth every 6 (six) hours as needed.    . traZODone (DESYREL) 100 MG tablet Take 1 tablet (100 mg total) by mouth at bedtime. For sleep 30 tablet 0   Current Facility-Administered Medications  Medication Dose Route Frequency Provider Last Rate Last Dose  . 0.9 %  sodium chloride infusion  500 mL Intravenous Continuous Gatha Mayer, MD        PHYSICAL EXAMINATION: ECOG PERFORMANCE STATUS: 1 - Symptomatic but completely ambulatory Weight 186.9 pounds, blood pressure 132/95, heart rate 61, temperature 98.8,  respirate 19, pulse ox 100% on room air GENERAL:alert, no distress and comfortable SKIN: skin color, texture, turgor are normal, no rashes or significant lesions EYES: normal, Conjunctiva are pink and non-injected, sclera clear OROPHARYNX:no exudate, no erythema and lips, buccal mucosa, and tongue normal  NECK: supple, thyroid normal size, non-tender, without nodularity LYMPH:  no palpable lymphadenopathy in the cervical, axillary or inguinal LUNGS: clear to auscultation and percussion with normal breathing effort HEART: regular rate & rhythm and no murmurs and no lower extremity edema ABDOMEN:abdomen soft, non-tender and normal bowel sounds Musculoskeletal:no cyanosis of digits and no clubbing  NEURO: alert & oriented x 3 with fluent speech, no focal motor/sensory deficits BREAST: s/p bilateral mastectomy, no palpable masses or nodules, (+) left implant appears flat , no axillary adenopathy   LABORATORY DATA:  I have reviewed the data as listed CBC Latest Ref Rng & Units 07/10/2018 07/14/2017 07/13/2017  WBC 4.0 - 10.5  K/uL 7.1 4.9 4.7  Hemoglobin 12.0 - 15.0 g/dL 13.2 11.9 12.4  Hematocrit 36.0 - 46.0 % 40.8 35.7 36.8  Platelets 150 - 400 K/uL 171 141(L) 138(L)     CMP Latest Ref Rng & Units 07/10/2018 06/05/2018 07/14/2017  Glucose 70 - 99 mg/dL 105(H) 101(H) 118  BUN 6 - 20 mg/dL _0 Creatinine 0.44 - 1.00 mg/dL 1.20(H) 1.24(H) 1.03  Sodium 135 - 145 mmol/L 143 138 141  Potassium 3.5 - 5.1 mmol/L 4.4 4.4 4.5  Chloride 98 - 111 mmol/L 109 103 106  CO2 22 - 32 mmol/L _1 Calcium 8.9 - 10.3 mg/dL 9.9 9.5 9.7  Total Protein 6.5 - 8.1 g/dL 8.3(H) 7.6 7.6  Total Bilirubin 0.3 - 1.2 mg/dL 0.3 0.3 0.2  Alkaline Phos 38 - 126 U/L 69 - 62  AST 15 - 41 U/L _2 ALT 0 - 44 U/L _3 RADIOGRAPHIC STUDIES: I have personally reviewed the radiological images as listed and agreed with the findings in the report. No results found.   ASSESSMENT & PLAN:  Marissa Brooks is a 63 y.o. female with history of  1. 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 She completed adjuvant chemotherapy with cytoxan and docetaxel for 4 cycles on 10//2016 and adjuvant radiation on 07/2015.  - She is currently on anti-estrogen oral therapy, Letrozole, started 05/2016.   -She is tolerating letrozole well, except a mild arthralgia, we will continue, plan for 5-7 years. -We again reviewed potential side effect from letrozole, including osteopenia and osteoporosis, arthralgia, etc.  She has not had bone density scan, I will schedule for her -Continue breast cancer surveillance.  She has bilateral mastectomy, no need mammogram. - Labs reviewed, CBC and CMP unremarkable except a mildly elevated creatinine.  Her clinical exam was unremarkable, no clinical concern for recurrence. -Continue letrozole and follow-up in 6 months.  2.  History of left breast DCIS, status post mastectomy -She had  reconstruction with implant by Dr. Eugene Garnet at [facility] in 2006 -pt would like to remove her implant. It has been over 10 years, it appears to be flat.  I give her the office number of Dr. Eugene Garnet and encourage her to call for appointment   3. Genetics -Giving her history of breast cancer twice, and family history (sister) of breast cancer, I recommended genetic counseling but she declined.  4. History of cirrhosis,  HCV infection, Hepatitis C -No imaging or additional workup found in our system though noted to have undergone treatment for HCV in 1999 at [facility] per note by Aurora Mask  [06/02/09] -Most recent LFTs reassuring -Finished Hep C treatment with Dr. Linus Salmons -Her recent HCV quantitative test was negative  5. Polysubstance abuse  -Hospitalized in February 2016 for substance-induced mood disorder. - I previously encouraged pt to continue with substance rehab and avoid illicit substances - She is working on alcohol, cocaine and smoking cessation.  -The patient can use Tylenol as needed for pain, I will not refill her tramadol.  6. Hypertension:  -F/u with PCP  7. Mood disorder:  -She does not follow with a psychiatrist and reports her PCP prescribes all of her psychiatric medications.  -F/u with PCP  8. Right arm lymph edema  -continue exercises at home -wears compression sleeve. -stable and mild     Plan  - Lab and F/u in 6 months  - I will order DEXA in [facility] in a month  -Continue letrozole -she will call Dr. Ardath Sax office to schedule appointment to discuss left implant removal   No problem-specific Assessment & Plan notes found for this encounter.   Orders Placed This Encounter  Procedures  . DG Bone Density    Standing Status:   Future    Standing Expiration Date:   07/10/2019    Order Specific Question:   Reason for Exam (SYMPTOM  OR DIAGNOSIS REQUIRED)    Answer:   screening    Order Specific Question:   Is the patient pregnant?     Answer:   No    Order Specific Question:   Preferred imaging location?    Answer:   [facility]   All questions were answered. The patient knows to call the clinic with any problems, questions or concerns. No barriers to learning was detected. I spent 20 minutes counseling the patient face to face. The total time spent in the appointment was 20 minutes and more than 50% was on counseling and review of test results  I, Manson Allan am acting as scribe for Dr. Truitt Merle.  I have reviewed the above documentation for accuracy and completeness, and I agree with the above.     Truitt Merle, MD 07/10/2018

## 2018-07-10 ENCOUNTER — Encounter: Payer: Self-pay | Admitting: Hematology

## 2018-07-10 ENCOUNTER — Inpatient Hospital Stay: Payer: Medicare HMO | Attending: Hematology

## 2018-07-10 ENCOUNTER — Inpatient Hospital Stay (HOSPITAL_BASED_OUTPATIENT_CLINIC_OR_DEPARTMENT_OTHER): Payer: Medicare HMO | Admitting: Hematology

## 2018-07-10 ENCOUNTER — Telehealth: Payer: Self-pay | Admitting: Hematology

## 2018-07-10 DIAGNOSIS — C50411 Malignant neoplasm of upper-outer quadrant of right female breast: Secondary | ICD-10-CM

## 2018-07-10 DIAGNOSIS — M25562 Pain in left knee: Secondary | ICD-10-CM

## 2018-07-10 DIAGNOSIS — Z170 Estrogen receptor positive status [ER+]: Secondary | ICD-10-CM

## 2018-07-10 DIAGNOSIS — I10 Essential (primary) hypertension: Secondary | ICD-10-CM

## 2018-07-10 DIAGNOSIS — F319 Bipolar disorder, unspecified: Secondary | ICD-10-CM

## 2018-07-10 DIAGNOSIS — Z9181 History of falling: Secondary | ICD-10-CM

## 2018-07-10 DIAGNOSIS — F1910 Other psychoactive substance abuse, uncomplicated: Secondary | ICD-10-CM | POA: Diagnosis not present

## 2018-07-10 DIAGNOSIS — K7460 Unspecified cirrhosis of liver: Secondary | ICD-10-CM | POA: Insufficient documentation

## 2018-07-10 DIAGNOSIS — Z720 Tobacco use: Secondary | ICD-10-CM

## 2018-07-10 DIAGNOSIS — Z86000 Personal history of in-situ neoplasm of breast: Secondary | ICD-10-CM

## 2018-07-10 DIAGNOSIS — M25561 Pain in right knee: Secondary | ICD-10-CM

## 2018-07-10 DIAGNOSIS — Z9013 Acquired absence of bilateral breasts and nipples: Secondary | ICD-10-CM | POA: Diagnosis not present

## 2018-07-10 DIAGNOSIS — N644 Mastodynia: Secondary | ICD-10-CM

## 2018-07-10 DIAGNOSIS — E2839 Other primary ovarian failure: Secondary | ICD-10-CM

## 2018-07-10 LAB — CBC WITH DIFFERENTIAL/PLATELET
Abs Immature Granulocytes: 0.02 10*3/uL (ref 0.00–0.07)
Basophils Absolute: 0.1 10*3/uL (ref 0.0–0.1)
Basophils Relative: 1 %
EOS ABS: 0.2 10*3/uL (ref 0.0–0.5)
Eosinophils Relative: 2 %
HEMATOCRIT: 40.8 % (ref 36.0–46.0)
Hemoglobin: 13.2 g/dL (ref 12.0–15.0)
Immature Granulocytes: 0 %
LYMPHS ABS: 1.9 10*3/uL (ref 0.7–4.0)
Lymphocytes Relative: 27 %
MCH: 29 pg (ref 26.0–34.0)
MCHC: 32.4 g/dL (ref 30.0–36.0)
MCV: 89.7 fL (ref 80.0–100.0)
Monocytes Absolute: 0.6 10*3/uL (ref 0.1–1.0)
Monocytes Relative: 8 %
Neutro Abs: 4.3 10*3/uL (ref 1.7–7.7)
Neutrophils Relative %: 62 %
Platelets: 171 10*3/uL (ref 150–400)
RBC: 4.55 MIL/uL (ref 3.87–5.11)
RDW: 14.2 % (ref 11.5–15.5)
WBC: 7.1 10*3/uL (ref 4.0–10.5)
nRBC: 0 % (ref 0.0–0.2)

## 2018-07-10 LAB — COMPREHENSIVE METABOLIC PANEL
ALK PHOS: 69 U/L (ref 38–126)
ALT: 21 U/L (ref 0–44)
ANION GAP: 9 (ref 5–15)
AST: 18 U/L (ref 15–41)
Albumin: 4.1 g/dL (ref 3.5–5.0)
BUN: 15 mg/dL (ref 6–20)
CO2: 25 mmol/L (ref 22–32)
Calcium: 9.9 mg/dL (ref 8.9–10.3)
Chloride: 109 mmol/L (ref 98–111)
Creatinine, Ser: 1.2 mg/dL — ABNORMAL HIGH (ref 0.44–1.00)
GFR calc Af Amer: 57 mL/min — ABNORMAL LOW (ref >60)
GFR calc non Af Amer: 49 mL/min — ABNORMAL LOW (ref >60)
Glucose, Bld: 105 mg/dL — ABNORMAL HIGH (ref 70–99)
Potassium: 4.4 mmol/L (ref 3.5–5.1)
Sodium: 143 mmol/L (ref 135–145)
Total Bilirubin: 0.3 mg/dL (ref 0.3–1.2)
Total Protein: 8.3 g/dL — ABNORMAL HIGH (ref 6.5–8.1)

## 2018-07-10 NOTE — Telephone Encounter (Signed)
Gave avs and calendar ° °

## 2018-07-16 ENCOUNTER — Other Ambulatory Visit: Payer: Self-pay

## 2018-07-16 ENCOUNTER — Telehealth: Payer: Self-pay

## 2018-07-16 DIAGNOSIS — Z170 Estrogen receptor positive status [ER+]: Principal | ICD-10-CM

## 2018-07-16 DIAGNOSIS — C50411 Malignant neoplasm of upper-outer quadrant of right female breast: Secondary | ICD-10-CM

## 2018-07-16 NOTE — Progress Notes (Signed)
bu

## 2018-07-16 NOTE — Telephone Encounter (Signed)
Patient called stating she wants referral to a plastic surgeon in [place] for implant removal.  Explained to patient we will make the referral and someone will be in touch with her.  She verbalized an understanding.  Referral was entered.

## 2018-09-25 ENCOUNTER — Other Ambulatory Visit: Payer: Self-pay

## 2018-12-04 ENCOUNTER — Other Ambulatory Visit: Payer: Self-pay

## 2018-12-04 ENCOUNTER — Ambulatory Visit: Payer: Medicare HMO | Admitting: Internal Medicine

## 2018-12-07 ENCOUNTER — Ambulatory Visit
Admission: RE | Admit: 2018-12-07 | Discharge: 2018-12-07 | Disposition: A | Discharge status: Home / Self Care | Payer: Medicare HMO | Source: Ambulatory Visit | Attending: Hematology | Admitting: Hematology

## 2018-12-07 ENCOUNTER — Other Ambulatory Visit: Payer: Self-pay

## 2018-12-07 DIAGNOSIS — E2839 Other primary ovarian failure: Secondary | ICD-10-CM

## 2018-12-10 ENCOUNTER — Telehealth: Payer: Self-pay

## 2018-12-10 NOTE — Telephone Encounter (Signed)
Attempted to reach patient on both numbers listed, neither working, trying to notify her bone density scan was normal and to remind her of her appointments.

## 2018-12-10 NOTE — Telephone Encounter (Signed)
-----   Message from Truitt Merle, MD sent at 12/10/2018 11:33 AM EDT ----- Please let pt know her DEXA scan result, which is normal. Also remind her next appointment with [place], thanks  Truitt Merle  12/10/2018

## 2019-01-07 ENCOUNTER — Encounter: Payer: Self-pay | Admitting: Internal Medicine

## 2019-01-07 ENCOUNTER — Other Ambulatory Visit: Payer: Self-pay

## 2019-01-07 ENCOUNTER — Ambulatory Visit (INDEPENDENT_AMBULATORY_CARE_PROVIDER_SITE_OTHER): Payer: Medicare HMO | Admitting: Internal Medicine

## 2019-01-07 VITALS — BP 149/83 | HR 63 | Temp 98.2°F

## 2019-01-07 DIAGNOSIS — Z23 Encounter for immunization: Secondary | ICD-10-CM

## 2019-01-07 DIAGNOSIS — K7460 Unspecified cirrhosis of liver: Secondary | ICD-10-CM

## 2019-01-07 NOTE — Assessment & Plan Note (Signed)
Will do hcc screening with ultrasound twice per year

## 2019-01-07 NOTE — Addendum Note (Signed)
Addended by: Gery Pray C on: 01/07/2019 05:00 PM   Modules accepted: Orders

## 2019-01-07 NOTE — Assessment & Plan Note (Signed)
Will do hepatitis #3 today

## 2019-01-07 NOTE — Progress Notes (Signed)
   Subjective:    Patient ID: Marissa Brooks, female    DOB: [birth year], 63 y.o.   MRN: [id number]  HPI Here for follow up of chronic hepatitis C Has genotype 1 and treated with 12 weeks of Harvoni and SVR 24 negative confiming cure.  No new issues.  Initial platelets < 150, now over 150 and elastography F3/4.  Started hepatitis B series and #3 due today.     Review of Systems  Constitutional: Negative for chills and fever.  Gastrointestinal: Negative for diarrhea and nausea.  Skin: Negative for rash.       Objective:   Physical Exam Constitutional:      Appearance: Normal appearance.  Eyes:     General: No scleral icterus. Cardiovascular:     Rate and Rhythm: Normal rate and regular rhythm.     Heart sounds: No murmur.  Pulmonary:     Effort: Pulmonary effort is normal. No respiratory distress.     Breath sounds: Normal breath sounds.  Skin:    Findings: No rash.  Neurological:     Mental Status: She is alert.  Psychiatric:        Mood and Affect: Mood normal.   SH: + tobacco and alcohol but trying to quit both        Assessment & Plan:

## 2019-01-10 ENCOUNTER — Inpatient Hospital Stay: Payer: Medicare HMO | Admitting: Hematology

## 2019-01-10 ENCOUNTER — Inpatient Hospital Stay: Payer: Medicare HMO | Attending: Hematology

## 2019-01-22 ENCOUNTER — Telehealth: Payer: Self-pay | Admitting: Hematology

## 2019-01-22 NOTE — Telephone Encounter (Signed)
Called pt pe r8/31 sch message - unable to reach pt . Left message for pt to call back to reschedule appt .

## 2019-02-12 ENCOUNTER — Telehealth: Payer: Self-pay | Admitting: Hematology

## 2019-02-12 NOTE — Telephone Encounter (Signed)
Patient called to reschedule 08/20 missed appointment, per patient's request appointment moved to 10/08.

## 2019-02-27 NOTE — Progress Notes (Signed)
[facility]   [phone] [phone]   Clinic Follow up Note   Patient Care Team: Einar Pheasant, DO as PCP - General (Family Medicine) Jovita Kussmaul, MD as Consulting Physician (General Surgery) Truitt Merle, MD as Consulting Physician (Hematology) Kyung Rudd, MD as Consulting Physician (Radiation Oncology)  Date of Service:  02/28/2019  CHIEF COMPLAINT: F/u of right breast   SUMMARY OF ONCOLOGIC HISTORY: Oncology History Overview Note  Breast cancer of upper-outer quadrant of right female breast s/p Right MRM 10/30/14   Staging form: Breast, AJCC 7th Edition     Pathologic: Stage IIA (T1b, N1a, cM0) - Unsigned     Breast cancer of upper-outer quadrant of right female breast s/p Right MRM 10/30/14  06/03/2004 Cancer Diagnosis   Left breast DCIS, status post mastectomy   06/06/2014 Mammogram   Mammogram and ultrasound showed an irregular 0.7 cm mass at the 12:30 clock position of right breast. Axillary nodes were negative.   08/12/2014 Initial Biopsy   Right breast 12:30 o'clock biopsy showed invasive ductal carcinoma, grade 2. ER+ (100%), PR+ (88%), HER2/neu negative, Ki67 19%   08/12/2014 Clinical Stage   Stage IA: T1b N0   10/30/2014 Surgery   Right breast mastectomy and axillary node dissection, surgical margins were negative.    10/30/2014 Pathology Results   Right breast radical mastectomy showed invasive ductal carcinoma, grade 1, tumor measuring 1.0 cm, margins were negative lymphovascular invasion (+), 1 sentinel lymph nodes positive, 15 additional axillary nodes negative.  (+) DCIS    10/30/2014 Pathologic Stage   Stage IIA: pT1b pN1a   12/30/2014 - 03/03/2015 Chemotherapy   Docetaxel 75 mg/m, Cytoxan 600 mg/m, every 3 weeks, s/p 4 cycles    03/15/2015 - 03/22/2015 Hospital Admission   Patient was admitted for GI bleeding, required blood transfusion, EDC showed esophagitis.   06/29/2015 - 08/07/2015 Radiation Therapy   Right chest wall  48 Gy in 24 fractions, right chest wall 1.8 Gy in 1 fractions, Right chest wall scar boost treated to 10 Gy in 5 fractions.   10/21/2015 Survivorship   Survivorship care plan mailed to patient   06/07/2016 -  Anti-estrogen oral therapy   Letrozole 2.5 mg daily, started January 2018.      CURRENT THERAPY:  Letrozole 2.5 mg daily, started January 2018.   INTERVAL HISTORY:  Marissa Brooks is here for a follow up right breast cancer. She was last seen by me 8 months ago. She presents to the clinic alone. She notes she is doing well. She lives in an apartment complex currently by herself for the past year. She feels she can take care of herself adequately. She notes she no longer drinks alcohol or smokes marijuana and no other drugs. She notes she still smokes 3-4 cigarettes each morning. She notes pain in the b/l lateral chest pain. She thinks is it related to her breast surgery. She was on Gabapentin for her nerve pain and now on another medication for her nerve pain by Dr Coral Spikes at [facility] in [facility]. She notes she still takes Letrozole. She notes she also takes Trazodone which Dr. Coral Spikes has refilled for her.    REVIEW OF SYSTEMS:   Constitutional: Denies fevers, chills or abnormal weight loss Eyes: Denies blurriness of vision Ears, nose, mouth, throat, and face: Denies mucositis or sore throat Respiratory: Denies cough, dyspnea or wheezes Cardiovascular: Denies palpitation, chest discomfort or lower extremity swelling Gastrointestinal:  Denies nausea, heartburn or change  in bowel habits Skin: Denies abnormal skin rashes Lymphatics: Denies new lymphadenopathy or easy bruising Neurological:Denies numbness, tingling or new weaknesses Behavioral/Psych: Mood is stable, no new changes  All other systems were reviewed with the patient and are negative.  MEDICAL HISTORY:  Past Medical History:  Diagnosis Date  . Allergy   . Anxiety   . ARF (acute renal failure) ([place])  03/15/2015  . Arthritis   . Bipolar disorder ([place])   . Breast cancer ([place]) 09/10/14   right breast,hx left breast ca,2002 [place]  . Cancer [facility] 2002   left breast cancer tx in [place], right breast cancer 08/2014  . Cocaine abuse ([place])    last use 10/13/14  . Depression   . ETOH abuse    as of 10/21/14 none for a week  . GERD (gastroesophageal reflux disease)   . HCAP (healthcare-associated pneumonia) 03/17/2015  . HCV (hepatitis C virus)   . Hypertension   . Lymphedema of upper extremity 08/04/2015  . 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 Pneumonia   . Shortness of breath dyspnea    with walking  . Tobacco abuse     SURGICAL HISTORY: Past Surgical History:  Procedure Laterality Date  . BREAST SURGERY  2002   left mastectomy with implant  . ESOPHAGOGASTRODUODENOSCOPY (EGD) WITH PROPOFOL N/A 03/17/2015   Procedure: ESOPHAGOGASTRODUODENOSCOPY (EGD) WITH PROPOFOL;  Surgeon: Gatha Mayer, MD;  Location: [facility];  Service: Endoscopy;  Laterality: N/A;  . MANDIBLE FRACTURE SURGERY    . MASTECTOMY     and Implant  . MASTECTOMY W/ SENTINEL NODE BIOPSY Right 10/30/2014  . SIMPLE MASTECTOMY WITH AXILLARY SENTINEL NODE BIOPSY Right 10/30/2014   Procedure: RIGHT MASTECTOMY WITH SENTINEL NODE MAPPING;  Surgeon: Autumn Messing III, MD;  Location: [facility];  Service: General;  Laterality: Right;  . TONSILLECTOMY      I have reviewed the social history and family history with the patient and they are unchanged from previous note.  ALLERGIES:  is allergic to centrum and multi-vitamin gummies  [actical].  MEDICATIONS:  Current Outpatient Medications  Medication Sig Dispense Refill  . ARIPiprazole (ABILIFY) 5 MG tablet Take 1 tablet (5 mg total) by mouth daily. For mood control 30 tablet 0  . letrozole (FEMARA) 2.5 MG tablet TAKE 1 TABLET (2.5 MG TOTAL) BY MOUTH DAILY. 90 tablet 2  . lisinopril (PRINIVIL,ZESTRIL) 10 MG tablet Take 10 mg by mouth daily.    .  Multiple Vitamins-Minerals (MULTIVITAMIN ADULT PO) Take 1 tablet by mouth daily.    . sertraline (ZOLOFT) 100 MG tablet Take 1 tablet (100 mg total) by mouth at bedtime. For depression 30 tablet 0  . traMADol (ULTRAM) 50 MG tablet Take by mouth every 6 (six) hours as needed.    . traZODone (DESYREL) 100 MG tablet Take 1 tablet (100 mg total) by mouth at bedtime. For sleep 30 tablet 0   Current Facility-Administered Medications  Medication Dose Route Frequency Provider Last Rate Last Dose  . 0.9 %  sodium chloride infusion  500 mL Intravenous Continuous Gatha Mayer, MD        PHYSICAL EXAMINATION: ECOG PERFORMANCE STATUS: 1 - Symptomatic but completely ambulatory  Vitals:   02/28/19 1402  BP: (!) 144/61  Pulse: 65  Resp: 17  Temp: 98.3 F (36.8 C)  SpO2: 100%   Filed Weights   02/28/19 1402  Weight: 181 lb 11.2 oz (82.4 kg)    GENERAL:alert, no distress and comfortable SKIN: skin color,  texture, turgor are normal, no rashes or significant lesions EYES: normal, Conjunctiva are pink and non-injected, sclera clear  NECK: supple, thyroid normal size, non-tender, without nodularity LYMPH:  no palpable lymphadenopathy in the cervical, axillary  LUNGS: clear to auscultation and percussion with normal breathing effort HEART: regular rate & rhythm and no murmurs and no lower extremity edema ABDOMEN:abdomen soft, non-tender and normal bowel sounds Musculoskeletal:no cyanosis of digits and no clubbing  NEURO: alert & oriented x 3 with fluent speech, no focal motor/sensory deficits BREAST: S/p b/l mastectomy: surgical incisions healed well with left breast implant (+) Mild left breast lymphedema and scar tissue. (+) Right arm lymphedema. No palpable mass, nodules or adenopathy bilaterally. Breast exam benign.   LABORATORY DATA:  I have reviewed the data as listed CBC Latest Ref Rng & Units 02/28/2019 07/10/2018 07/14/2017  WBC 4.0 - 10.5 K/uL 6.2 7.1 4.9  Hemoglobin 12.0 - 15.0 g/dL  12.2 13.2 11.9  Hematocrit 36.0 - 46.0 % 37.3 40.8 35.7  Platelets 150 - 400 K/uL 158 171 141(L)     CMP Latest Ref Rng & Units 02/28/2019 07/10/2018 06/05/2018  Glucose 70 - 99 mg/dL 98 105(H) 101(H)  BUN 8 - 23 mg/dL 15 15 19   Creatinine 0.44 - 1.00 mg/dL 1.34(H) 1.20(H) 1.24(H)  Sodium 135 - 145 mmol/L 138 143 138  Potassium 3.5 - 5.1 mmol/L 4.7 4.4 4.4  Chloride 98 - 111 mmol/L 103 109 103  CO2 22 - 32 mmol/L 28 25 25   Calcium 8.9 - 10.3 mg/dL 9.7 9.9 9.5  Total Protein 6.5 - 8.1 g/dL 7.8 8.3(H) 7.6  Total Bilirubin 0.3 - 1.2 mg/dL <0.2(L) 0.3 0.3  Alkaline Phos 38 - 126 U/L 60 69 -  AST 15 - 41 U/L 16 18 16   ALT 0 - 44 U/L 17 21 15       RADIOGRAPHIC STUDIES: I have personally reviewed the radiological images as listed and agreed with the findings in the report. No results found.   ASSESSMENT & PLAN:  Marissa Brooks is a 63 y.o. female with   1. Stage IIa, pT1bpN1a,M0, right invasive ductal carcinoma s/p mastectomy, ER+/PR+/HER- , G1 -She was diagnosed on 08/12/2014 and then underwent a bilateral mastectomy. She completed adjuvant chemotherapy with cytoxan and docetaxel for 4 cycles on 10//2016 and adjuvant radiation on 07/2015.  -She is currently on anti-estrogen oral therapy, Letrozole, started 05/2016.   -She is tolerating letrozole well, except a mild arthralgia, we will continue, plan for 5-7 years. -From a breast cancer standpoint she is clinically doing well. Labs reviewed, CBC and CMP WNL except Cr 1.34. Physical exam showed Left breast soft tissue fullness around implant and right arm lymphedema. I encouraged her to use compression sleeve and to exercise those areas. There is no clinical concern for recurrence.  -Continue surveillance. She had b/l mastectomy, she does not need mammogram  -Patient wishes to remove the left implant, I encouraged her to contact her plastic surgeon at [facility].  -f/u in 6 months   2.  History of left  breast DCIS, status post mastectomy -She had reconstruction with implant by Dr. Eugene Garnet at [facility] in 2006 -pt still would like to remove her implant. It has been over 10 years, it appears to be flat. I again gave her the office number of Dr. Eugene Garnet and encourage her to call for appointment.   3. Genetic testing was recommended, but she declined.   4. History of cirrhosis,HCV infection, Hepatitis C -  No imaging or additional workup found in our system though noted to have undergone treatment for HCV in 1999 at [facility] per note by Aurora Mask [06/02/09] -Most recent LFTs reassuring -Finished Hep C treatment with Dr. Linus Salmons -Her recent HCV quantitative test was negative  5. Polysubstance abuse -Hospitalized in February 2016 for substance-induced mood disorder. -I previously encouraged pt to continue with substance rehab and avoid illicit substances -Per patient she no longer drinks alcohol, smokes marijuana or take other drugs. She does still smokes cigarettes, now 3-4 cigarettes a day.  -The patientcan use Tylenol as needed for pain, I will not refill her tramadol. She get refills from her PCP Dr. Coral Spikes at [facility].   6. Hypertension:  -F/u with PCP -BP at 144/61 today (02/28/19)  7. Mood disorder:  -She does not follow with a psychiatrist and reports her PCP prescribes all of her psychiatric medications.  -F/u with PCP  8. Right arm lymph edema  -continue exercises at home -stable and mild  -She needs another compression sleeve and bra prosthesis. I encouraged her to go to second nature.    Plan  -Flu shot today  -Continue letrozole  - Lab and F/u in 6 months  -I encouraged her to call Dr. Ardath Sax office to schedule appointment to discuss left implant removal    No problem-specific Assessment & Plan notes found for this encounter.   No orders of the defined types were placed in this encounter.  All questions were answered. The patient  knows to call the clinic with any problems, questions or concerns. No barriers to learning was detected. I spent 15 minutes counseling the patient face to face. The total time spent in the appointment was 20 minutes and more than 50% was on counseling and review of test results     Truitt Merle, MD 02/28/2019   I, Joslyn Devon, am acting as scribe for Truitt Merle, MD.   I have reviewed the above documentation for accuracy and completeness, and I agree with the above.

## 2019-02-28 ENCOUNTER — Encounter: Payer: Self-pay | Admitting: Hematology

## 2019-02-28 ENCOUNTER — Inpatient Hospital Stay (HOSPITAL_BASED_OUTPATIENT_CLINIC_OR_DEPARTMENT_OTHER): Payer: Medicare HMO | Admitting: Hematology

## 2019-02-28 ENCOUNTER — Other Ambulatory Visit: Payer: Self-pay

## 2019-02-28 ENCOUNTER — Inpatient Hospital Stay: Payer: Medicare HMO | Attending: Hematology

## 2019-02-28 VITALS — BP 144/61 | HR 65 | Temp 98.3°F | Resp 17 | Ht 62.0 in | Wt 181.7 lb

## 2019-02-28 DIAGNOSIS — Z9013 Acquired absence of bilateral breasts and nipples: Secondary | ICD-10-CM | POA: Insufficient documentation

## 2019-02-28 DIAGNOSIS — I890 Lymphedema, not elsewhere classified: Secondary | ICD-10-CM | POA: Insufficient documentation

## 2019-02-28 DIAGNOSIS — Z170 Estrogen receptor positive status [ER+]: Secondary | ICD-10-CM

## 2019-02-28 DIAGNOSIS — I10 Essential (primary) hypertension: Secondary | ICD-10-CM | POA: Diagnosis not present

## 2019-02-28 DIAGNOSIS — F319 Bipolar disorder, unspecified: Secondary | ICD-10-CM

## 2019-02-28 DIAGNOSIS — C50411 Malignant neoplasm of upper-outer quadrant of right female breast: Secondary | ICD-10-CM

## 2019-02-28 DIAGNOSIS — R079 Chest pain, unspecified: Secondary | ICD-10-CM | POA: Diagnosis not present

## 2019-02-28 DIAGNOSIS — B182 Chronic viral hepatitis C: Secondary | ICD-10-CM

## 2019-02-28 DIAGNOSIS — Z23 Encounter for immunization: Secondary | ICD-10-CM

## 2019-02-28 DIAGNOSIS — B1920 Unspecified viral hepatitis C without hepatic coma: Secondary | ICD-10-CM | POA: Insufficient documentation

## 2019-02-28 DIAGNOSIS — F17210 Nicotine dependence, cigarettes, uncomplicated: Secondary | ICD-10-CM | POA: Insufficient documentation

## 2019-02-28 DIAGNOSIS — Z86000 Personal history of in-situ neoplasm of breast: Secondary | ICD-10-CM | POA: Insufficient documentation

## 2019-02-28 LAB — CBC WITH DIFFERENTIAL/PLATELET
Abs Immature Granulocytes: 0.02 10*3/uL (ref 0.00–0.07)
Basophils Absolute: 0 10*3/uL (ref 0.0–0.1)
Basophils Relative: 1 %
Eosinophils Absolute: 0.2 10*3/uL (ref 0.0–0.5)
Eosinophils Relative: 3 %
HCT: 37.3 % (ref 36.0–46.0)
Hemoglobin: 12.2 g/dL (ref 12.0–15.0)
Immature Granulocytes: 0 %
Lymphocytes Relative: 29 %
Lymphs Abs: 1.8 10*3/uL (ref 0.7–4.0)
MCH: 28.8 pg (ref 26.0–34.0)
MCHC: 32.7 g/dL (ref 30.0–36.0)
MCV: 88 fL (ref 80.0–100.0)
Monocytes Absolute: 0.5 10*3/uL (ref 0.1–1.0)
Monocytes Relative: 7 %
Neutro Abs: 3.7 10*3/uL (ref 1.7–7.7)
Neutrophils Relative %: 60 %
Platelets: 158 10*3/uL (ref 150–400)
RBC: 4.24 MIL/uL (ref 3.87–5.11)
RDW: 13.3 % (ref 11.5–15.5)
WBC: 6.2 10*3/uL (ref 4.0–10.5)
nRBC: 0 % (ref 0.0–0.2)

## 2019-02-28 LAB — COMPREHENSIVE METABOLIC PANEL
ALT: 17 U/L (ref 0–44)
AST: 16 U/L (ref 15–41)
Albumin: 4.1 g/dL (ref 3.5–5.0)
Alkaline Phosphatase: 60 U/L (ref 38–126)
Anion gap: 7 (ref 5–15)
BUN: 15 mg/dL (ref 8–23)
CO2: 28 mmol/L (ref 22–32)
Calcium: 9.7 mg/dL (ref 8.9–10.3)
Chloride: 103 mmol/L (ref 98–111)
Creatinine, Ser: 1.34 mg/dL — ABNORMAL HIGH (ref 0.44–1.00)
GFR calc Af Amer: 49 mL/min — ABNORMAL LOW (ref >60)
GFR calc non Af Amer: 43 mL/min — ABNORMAL LOW (ref >60)
Glucose, Bld: 98 mg/dL (ref 70–99)
Potassium: 4.7 mmol/L (ref 3.5–5.1)
Sodium: 138 mmol/L (ref 135–145)
Total Bilirubin: <0.2 mg/dL — ABNORMAL LOW (ref 0.3–1.2)
Total Protein: 7.8 g/dL (ref 6.5–8.1)

## 2019-02-28 MED ORDER — INFLUENZA VAC SPLIT QUAD 0.5 ML IM SUSY
PREFILLED_SYRINGE | INTRAMUSCULAR | Status: AC
  Filled 2019-02-28: qty 0.5

## 2019-02-28 MED ORDER — INFLUENZA VAC SPLIT QUAD 0.5 ML IM SUSY
0.5000 mL | PREFILLED_SYRINGE | Freq: Once | INTRAMUSCULAR | Status: AC
  Administered 2019-02-28: 0.5 mL via INTRAMUSCULAR

## 2019-03-01 ENCOUNTER — Telehealth: Payer: Self-pay | Admitting: Hematology

## 2019-03-01 NOTE — Telephone Encounter (Signed)
Scheduled appt per 10/8 los.  Sent a staff message to get a calendar mailed out.

## 2019-04-01 ENCOUNTER — Other Ambulatory Visit: Payer: Self-pay | Admitting: Hematology

## 2019-04-01 DIAGNOSIS — C50411 Malignant neoplasm of upper-outer quadrant of right female breast: Secondary | ICD-10-CM

## 2019-04-03 NOTE — H&P (Signed)
Subjective:     Patient ID: Marissa Brooks is a 63 y.o. female.  HPI Last seen 06/2015. Returns to discuss removal left breast implant. States no pain, just "not doing her any good." Weight up 28 lb since last visit here.  History of screening MMG in January 2016 with abnormality and biopsy with IDC , ER/PR +, HER-2 -. She underwent right mastectomy by Dr. Toth on 10/28/2014 with tumor 1.0 cm, + LVI, +1/15 LN . Completed adjuvant chemotherapy and radiation delayed due to hospital admission 02/2015 for GIB. Completed radiation right chest 07/2014. On letrozole.  She has history left simple mastectomy at [facility] on 07/16/04 after detection of multiple abnormalities in the left breast on mammography and was diagnosed with DCIS. On 05/27/05, she underwent placement of left tissue expander but eventual implant based reconstruction with Dr. De Franzo, last seen 2011. She stated in past that she has implant card at home, stated silicone.   Declined genetic testing.  She has history of cocaine and heroin use. She completed drug rehabilitation in past. She is active smoker. She also has history of bipolar. She is widowed, has no children. She lives alone these days but reports has friends to stay with her after surgery.  She has history HCV and cirrhosis undergone treatment for HCV in 1999 at [facility].  Review of Systems  Constitutional: Positive for fatigue.  Eyes: Positive for visual disturbance.  Endocrine: Positive for polydipsia.  Musculoskeletal:       + right upper extremity edema  Hematological: Bruises/bleeds easily.  Psychiatric/Behavioral: Positive for dysphoric mood and sleep disturbance.       Objective:   Physical Exam  Cardiovascular: Normal rate. Normal heart sounds Pulmonary/Chest: Effort normal. Clear to asculation Abdominal: Soft.  Genitourinary:    Genitourinary Comments: Left chest implant soft, no contracture, transverse scar that ends in V shape  medially Right chest post mastectomy transverse scar   Lymphadenopathy:  + lymphedema RUE  BW right chest 13 cm L 14 cm    Assessment:     Acquired absence bilateral breasts Bilateral breast cancer Right chest radiation adjuvant +tobacco, cocaine use    Plan:  Since last visit she has stopped drinking and smoking marijuana. Reports she occasionally uses crack, last 2 weeks ago. Tobacco use- counseled recommend she be tobacco and nicotine free for at least 4-6 weeks prior to any surgery. However overall much decreased use, reports 5-6 cig per day.  Patient using right prosthesis. If she does indeed have silicone implant on left reasonable to plan removal as it will eventually (if not presently) rupture.  Plan removal left breast implant with possible capsulectomy. Reviewed OP surgery, needs to have adult in home with her post operative. Reviewed use same scars, drain, needs to refrain from crack use, and will be required to have COVID test preoperatively.   , MD MBA Plastic & Reconstructive Surgery    

## 2019-04-08 ENCOUNTER — Other Ambulatory Visit: Payer: Self-pay

## 2019-04-09 ENCOUNTER — Other Ambulatory Visit (HOSPITAL_COMMUNITY)
Admission: RE | Admit: 2019-04-09 | Discharge: 2019-04-09 | Disposition: A | Discharge status: Home / Self Care | Payer: Medicare HMO | Source: Ambulatory Visit | Attending: Plastic Surgery | Admitting: Plastic Surgery

## 2019-04-09 ENCOUNTER — Other Ambulatory Visit (HOSPITAL_COMMUNITY): Payer: Medicare HMO

## 2019-04-09 DIAGNOSIS — Z01812 Encounter for preprocedural laboratory examination: Secondary | ICD-10-CM | POA: Insufficient documentation

## 2019-04-09 DIAGNOSIS — Z20828 Contact with and (suspected) exposure to other viral communicable diseases: Secondary | ICD-10-CM | POA: Insufficient documentation

## 2019-04-10 LAB — NOVEL CORONAVIRUS, NAA (HOSP ORDER, SEND-OUT TO REF LAB; TAT 18-24 HRS): SARS-CoV-2, NAA: NOT DETECTED

## 2019-04-11 ENCOUNTER — Other Ambulatory Visit (HOSPITAL_COMMUNITY): Payer: Medicare HMO

## 2019-04-12 ENCOUNTER — Ambulatory Visit (HOSPITAL_BASED_OUTPATIENT_CLINIC_OR_DEPARTMENT_OTHER)
Admission: RE | Admit: 2019-04-12 | Discharge: 2019-04-12 | Disposition: A | Discharge status: Home / Self Care | Payer: Medicare HMO | Attending: Plastic Surgery | Admitting: Plastic Surgery

## 2019-04-12 ENCOUNTER — Ambulatory Visit (HOSPITAL_BASED_OUTPATIENT_CLINIC_OR_DEPARTMENT_OTHER): Payer: Medicare HMO | Admitting: Certified Registered"

## 2019-04-12 ENCOUNTER — Encounter (HOSPITAL_BASED_OUTPATIENT_CLINIC_OR_DEPARTMENT_OTHER)
Admission: RE | Disposition: A | Discharge status: Home / Self Care | Payer: Self-pay | Source: Home / Self Care | Attending: Plastic Surgery

## 2019-04-12 ENCOUNTER — Other Ambulatory Visit: Payer: Self-pay

## 2019-04-12 ENCOUNTER — Encounter (HOSPITAL_BASED_OUTPATIENT_CLINIC_OR_DEPARTMENT_OTHER): Payer: Self-pay | Admitting: *Deleted

## 2019-04-12 DIAGNOSIS — Z9221 Personal history of antineoplastic chemotherapy: Secondary | ICD-10-CM | POA: Insufficient documentation

## 2019-04-12 DIAGNOSIS — Z45812 Encounter for adjustment or removal of left breast implant: Secondary | ICD-10-CM | POA: Diagnosis present

## 2019-04-12 DIAGNOSIS — Z79811 Long term (current) use of aromatase inhibitors: Secondary | ICD-10-CM | POA: Diagnosis not present

## 2019-04-12 DIAGNOSIS — I10 Essential (primary) hypertension: Secondary | ICD-10-CM | POA: Insufficient documentation

## 2019-04-12 DIAGNOSIS — Z170 Estrogen receptor positive status [ER+]: Secondary | ICD-10-CM | POA: Diagnosis not present

## 2019-04-12 DIAGNOSIS — Z923 Personal history of irradiation: Secondary | ICD-10-CM | POA: Diagnosis not present

## 2019-04-12 DIAGNOSIS — Z9013 Acquired absence of bilateral breasts and nipples: Secondary | ICD-10-CM | POA: Diagnosis not present

## 2019-04-12 DIAGNOSIS — C50911 Malignant neoplasm of unspecified site of right female breast: Secondary | ICD-10-CM | POA: Diagnosis not present

## 2019-04-12 DIAGNOSIS — C50912 Malignant neoplasm of unspecified site of left female breast: Secondary | ICD-10-CM | POA: Insufficient documentation

## 2019-04-12 DIAGNOSIS — J449 Chronic obstructive pulmonary disease, unspecified: Secondary | ICD-10-CM | POA: Diagnosis not present

## 2019-04-12 DIAGNOSIS — F17210 Nicotine dependence, cigarettes, uncomplicated: Secondary | ICD-10-CM | POA: Diagnosis not present

## 2019-04-12 HISTORY — PX: CAPSULECTOMY: SHX5381

## 2019-04-12 HISTORY — PX: BREAST IMPLANT REMOVAL: SHX5361

## 2019-04-12 LAB — COMPREHENSIVE METABOLIC PANEL
ALT: 18 U/L (ref 0–44)
AST: 24 U/L (ref 15–41)
Albumin: 3.8 g/dL (ref 3.5–5.0)
Alkaline Phosphatase: 57 U/L (ref 38–126)
Anion gap: 10 (ref 5–15)
BUN: 7 mg/dL — ABNORMAL LOW (ref 8–23)
CO2: 24 mmol/L (ref 22–32)
Calcium: 9.3 mg/dL (ref 8.9–10.3)
Chloride: 106 mmol/L (ref 98–111)
Creatinine, Ser: 1.01 mg/dL — ABNORMAL HIGH (ref 0.44–1.00)
GFR calc Af Amer: >60 mL/min (ref >60)
GFR calc non Af Amer: >60 mL/min (ref >60)
Glucose, Bld: 98 mg/dL (ref 70–99)
Potassium: 4 mmol/L (ref 3.5–5.1)
Sodium: 140 mmol/L (ref 135–145)
Total Bilirubin: 0.4 mg/dL (ref 0.3–1.2)
Total Protein: 6.9 g/dL (ref 6.5–8.1)

## 2019-04-12 SURGERY — REMOVAL, IMPLANT, BREAST
Anesthesia: General | Site: Breast | Laterality: Left

## 2019-04-12 MED ORDER — GABAPENTIN 300 MG PO CAPS
ORAL_CAPSULE | ORAL | Status: AC
  Filled 2019-04-12: qty 1

## 2019-04-12 MED ORDER — CEFAZOLIN SODIUM-DEXTROSE 2-4 GM/100ML-% IV SOLN
2.0000 g | INTRAVENOUS | Status: AC
  Administered 2019-04-12: 2 g via INTRAVENOUS

## 2019-04-12 MED ORDER — TRAMADOL HCL 50 MG PO TABS: 50.0000 mg | ORAL_TABLET | Freq: Four times a day (QID) | ORAL | 0 refills | Status: AC | PRN

## 2019-04-12 MED ORDER — DEXAMETHASONE SODIUM PHOSPHATE 10 MG/ML IJ SOLN
INTRAMUSCULAR | Status: DC | PRN
  Administered 2019-04-12: 10 mg via INTRAVENOUS

## 2019-04-12 MED ORDER — FENTANYL CITRATE (PF) 100 MCG/2ML IJ SOLN
INTRAMUSCULAR | Status: DC | PRN
  Administered 2019-04-12 (×2): 50 ug via INTRAVENOUS

## 2019-04-12 MED ORDER — HEPARIN SODIUM (PORCINE) 5000 UNIT/ML IJ SOLN
INTRAMUSCULAR | Status: AC
  Filled 2019-04-12: qty 1

## 2019-04-12 MED ORDER — ROCURONIUM BROMIDE 100 MG/10ML IV SOLN
INTRAVENOUS | Status: DC | PRN
  Administered 2019-04-12: 80 mg via INTRAVENOUS

## 2019-04-12 MED ORDER — ONDANSETRON HCL 4 MG/2ML IJ SOLN
INTRAMUSCULAR | Status: DC | PRN
  Administered 2019-04-12: 4 mg via INTRAVENOUS

## 2019-04-12 MED ORDER — MIDAZOLAM HCL 5 MG/5ML IJ SOLN
INTRAMUSCULAR | Status: DC | PRN
  Administered 2019-04-12: 1 mg via INTRAVENOUS

## 2019-04-12 MED ORDER — FENTANYL CITRATE (PF) 100 MCG/2ML IJ SOLN
INTRAMUSCULAR | Status: AC
  Filled 2019-04-12: qty 2

## 2019-04-12 MED ORDER — 0.9 % SODIUM CHLORIDE (POUR BTL) OPTIME
TOPICAL | Status: DC | PRN
  Administered 2019-04-12: 200 mL

## 2019-04-12 MED ORDER — LACTATED RINGERS IV SOLN
INTRAVENOUS | Status: DC
  Administered 2019-04-12: 10:00:00 via INTRAVENOUS

## 2019-04-12 MED ORDER — MIDAZOLAM HCL 2 MG/2ML IJ SOLN
INTRAMUSCULAR | Status: AC
  Filled 2019-04-12: qty 2

## 2019-04-12 MED ORDER — GLYCOPYRROLATE 0.2 MG/ML IJ SOLN
INTRAMUSCULAR | Status: DC | PRN
  Administered 2019-04-12: .1 mg via INTRAVENOUS

## 2019-04-12 MED ORDER — CELECOXIB 200 MG PO CAPS
ORAL_CAPSULE | ORAL | Status: AC
  Filled 2019-04-12: qty 1

## 2019-04-12 MED ORDER — EPHEDRINE SULFATE 50 MG/ML IJ SOLN
INTRAMUSCULAR | Status: DC | PRN
  Administered 2019-04-12: 5 mg via INTRAVENOUS
  Administered 2019-04-12 (×2): 10 mg via INTRAVENOUS

## 2019-04-12 MED ORDER — FENTANYL CITRATE (PF) 100 MCG/2ML IJ SOLN
25.0000 ug | INTRAMUSCULAR | Status: DC | PRN
  Administered 2019-04-12: 50 ug via INTRAVENOUS

## 2019-04-12 MED ORDER — TRAMADOL HCL 50 MG PO TABS
ORAL_TABLET | ORAL | Status: AC
  Filled 2019-04-12: qty 1

## 2019-04-12 MED ORDER — CEFAZOLIN SODIUM-DEXTROSE 2-4 GM/100ML-% IV SOLN
INTRAVENOUS | Status: AC
  Filled 2019-04-12: qty 100

## 2019-04-12 MED ORDER — TRAMADOL HCL 50 MG PO TABS
50.0000 mg | ORAL_TABLET | Freq: Once | ORAL | Status: AC
  Administered 2019-04-12: 50 mg via ORAL

## 2019-04-12 MED ORDER — GABAPENTIN 300 MG PO CAPS
300.0000 mg | ORAL_CAPSULE | ORAL | Status: AC
  Administered 2019-04-12: 300 mg via ORAL

## 2019-04-12 MED ORDER — BUPIVACAINE HCL (PF) 0.25 % IJ SOLN
INTRAMUSCULAR | Status: DC | PRN
  Administered 2019-04-12: 20 mL

## 2019-04-12 MED ORDER — ACETAMINOPHEN 500 MG PO TABS
1000.0000 mg | ORAL_TABLET | ORAL | Status: AC
  Administered 2019-04-12: 1000 mg via ORAL

## 2019-04-12 MED ORDER — SUGAMMADEX SODIUM 500 MG/5ML IV SOLN
INTRAVENOUS | Status: DC | PRN
  Administered 2019-04-12: 400 mg via INTRAVENOUS

## 2019-04-12 MED ORDER — PROPOFOL 10 MG/ML IV BOLUS
INTRAVENOUS | Status: AC
  Filled 2019-04-12: qty 20

## 2019-04-12 MED ORDER — HEPARIN SODIUM (PORCINE) 5000 UNIT/ML IJ SOLN
5000.0000 [IU] | Freq: Once | INTRAMUSCULAR | Status: AC
  Administered 2019-04-12: 5000 [IU] via SUBCUTANEOUS

## 2019-04-12 MED ORDER — ACETAMINOPHEN 500 MG PO TABS
ORAL_TABLET | ORAL | Status: AC
  Filled 2019-04-12: qty 2

## 2019-04-12 MED ORDER — CELECOXIB 200 MG PO CAPS
200.0000 mg | ORAL_CAPSULE | ORAL | Status: AC
  Administered 2019-04-12: 200 mg via ORAL

## 2019-04-12 MED ORDER — LABETALOL HCL 5 MG/ML IV SOLN
INTRAVENOUS | Status: DC | PRN
  Administered 2019-04-12: 5 mg via INTRAVENOUS

## 2019-04-12 MED ORDER — PROPOFOL 10 MG/ML IV BOLUS
INTRAVENOUS | Status: DC | PRN
  Administered 2019-04-12: 50 mg via INTRAVENOUS
  Administered 2019-04-12: 100 mg via INTRAVENOUS

## 2019-04-12 SURGICAL SUPPLY — 85 items
ADH SKN CLS APL DERMABOND .7 (GAUZE/BANDAGES/DRESSINGS) ×1
APL PRP STRL LF DISP 70% ISPRP (MISCELLANEOUS) ×1
BAG DECANTER FOR FLEXI CONT (MISCELLANEOUS) ×1 IMPLANT
BINDER BREAST LRG (GAUZE/BANDAGES/DRESSINGS) IMPLANT
BINDER BREAST MEDIUM (GAUZE/BANDAGES/DRESSINGS) IMPLANT
BINDER BREAST XLRG (GAUZE/BANDAGES/DRESSINGS) IMPLANT
BINDER BREAST XXLRG (GAUZE/BANDAGES/DRESSINGS) IMPLANT
BLADE SURG 10 STRL SS (BLADE) ×3 IMPLANT
BLADE SURG 15 STRL LF DISP TIS (BLADE) IMPLANT
BLADE SURG 15 STRL SS (BLADE) ×3
BNDG ELASTIC 6X5.8 VLCR STR LF (GAUZE/BANDAGES/DRESSINGS) IMPLANT
BNDG GAUZE ELAST 4 BULKY (GAUZE/BANDAGES/DRESSINGS) ×2 IMPLANT
CANISTER SUCT 1200ML W/VALVE (MISCELLANEOUS) ×3 IMPLANT
CHLORAPREP W/TINT 26 (MISCELLANEOUS) ×3 IMPLANT
CLOSURE WOUND 1/2 X4 (GAUZE/BANDAGES/DRESSINGS)
COVER BACK TABLE REUSABLE LG (DRAPES) ×3 IMPLANT
COVER MAYO STAND REUSABLE (DRAPES) ×3 IMPLANT
COVER WAND RF STERILE (DRAPES) IMPLANT
DECANTER SPIKE VIAL GLASS SM (MISCELLANEOUS) IMPLANT
DERMABOND ADVANCED (GAUZE/BANDAGES/DRESSINGS) ×2
DERMABOND ADVANCED .7 DNX12 (GAUZE/BANDAGES/DRESSINGS) ×1 IMPLANT
DRAIN CHANNEL 15F RND FF W/TCR (WOUND CARE) ×1 IMPLANT
DRAPE HALF SHEET 70X43 (DRAPES) ×4 IMPLANT
DRAPE INCISE IOBAN 66X45 STRL (DRAPES) IMPLANT
DRAPE TOP ARMCOVERS (MISCELLANEOUS) ×3 IMPLANT
DRAPE U-SHAPE 76X120 STRL (DRAPES) ×3 IMPLANT
DRAPE UTILITY XL STRL (DRAPES) ×3 IMPLANT
DRSG PAD ABDOMINAL 8X10 ST (GAUZE/BANDAGES/DRESSINGS) ×6 IMPLANT
ELECT BLADE 4.0 EZ CLEAN MEGAD (MISCELLANEOUS)
ELECT BLADE 6.5 EXT (BLADE) IMPLANT
ELECT COATED BLADE 2.86 ST (ELECTRODE) ×3 IMPLANT
ELECT REM PT RETURN 9FT ADLT (ELECTROSURGICAL) ×3
ELECTRODE BLDE 4.0 EZ CLN MEGD (MISCELLANEOUS) ×1 IMPLANT
ELECTRODE REM PT RTRN 9FT ADLT (ELECTROSURGICAL) ×1 IMPLANT
EVACUATOR SILICONE 100CC (DRAIN) ×3 IMPLANT
GAUZE SPONGE 4X4 12PLY STRL (GAUZE/BANDAGES/DRESSINGS) IMPLANT
GAUZE SPONGE 4X4 12PLY STRL LF (GAUZE/BANDAGES/DRESSINGS) IMPLANT
GLOVE BIO SURGEON STRL SZ 6 (GLOVE) ×5 IMPLANT
GLOVE BIO SURGEON STRL SZ 6.5 (GLOVE) IMPLANT
GLOVE BIO SURGEONS STRL SZ 6.5 (GLOVE)
GLOVE BIOGEL PI IND STRL 6.5 (GLOVE) IMPLANT
GLOVE BIOGEL PI IND STRL 7.0 (GLOVE) IMPLANT
GLOVE BIOGEL PI INDICATOR 6.5 (GLOVE)
GLOVE BIOGEL PI INDICATOR 7.0 (GLOVE) ×4
GLOVE ECLIPSE 6.5 STRL STRAW (GLOVE) ×2 IMPLANT
GOWN STRL REUS W/ TWL LRG LVL3 (GOWN DISPOSABLE) ×2 IMPLANT
GOWN STRL REUS W/TWL LRG LVL3 (GOWN DISPOSABLE) ×6
IV NS 500ML (IV SOLUTION)
IV NS 500ML BAXH (IV SOLUTION) ×1 IMPLANT
KIT FILL SYSTEM UNIVERSAL (SET/KITS/TRAYS/PACK) IMPLANT
MARKER SKIN DUAL TIP RULER LAB (MISCELLANEOUS) IMPLANT
NDL FILTER BLUNT 18X1 1/2 (NEEDLE) IMPLANT
NDL HYPO 25X1 1.5 SAFETY (NEEDLE) IMPLANT
NEEDLE FILTER BLUNT 18X 1/2SAF (NEEDLE)
NEEDLE FILTER BLUNT 18X1 1/2 (NEEDLE) IMPLANT
NEEDLE HYPO 25X1 1.5 SAFETY (NEEDLE) ×3 IMPLANT
NS IRRIG 1000ML POUR BTL (IV SOLUTION) ×2 IMPLANT
PACK BASIN DAY SURGERY FS (CUSTOM PROCEDURE TRAY) ×3 IMPLANT
PENCIL SMOKE EVACUATOR (MISCELLANEOUS) ×3 IMPLANT
PIN SAFETY STERILE (MISCELLANEOUS) ×3 IMPLANT
SLEEVE SCD COMPRESS KNEE MED (MISCELLANEOUS) ×3 IMPLANT
SPONGE LAP 18X18 RF (DISPOSABLE) ×6 IMPLANT
STAPLER VISISTAT 35W (STAPLE) ×3 IMPLANT
STRIP CLOSURE SKIN 1/2X4 (GAUZE/BANDAGES/DRESSINGS) IMPLANT
SUT ETHILON 2 0 FS 18 (SUTURE) ×2 IMPLANT
SUT MNCRL AB 4-0 PS2 18 (SUTURE) ×3 IMPLANT
SUT PDS 3-0 CT2 (SUTURE)
SUT PDS AB 2-0 CT2 27 (SUTURE) IMPLANT
SUT PDS II 3-0 CT2 27 ABS (SUTURE) IMPLANT
SUT VIC AB 3-0 PS1 18 (SUTURE) ×3
SUT VIC AB 3-0 PS1 18XBRD (SUTURE) IMPLANT
SUT VIC AB 3-0 SH 27 (SUTURE)
SUT VIC AB 3-0 SH 27X BRD (SUTURE) ×1 IMPLANT
SUT VICRYL 4-0 PS2 18IN ABS (SUTURE) ×3 IMPLANT
SWAB COLLECTION DEVICE MRSA (MISCELLANEOUS) IMPLANT
SWAB CULTURE ESWAB REG 1ML (MISCELLANEOUS) IMPLANT
SYR 20ML LL LF (SYRINGE) IMPLANT
SYR 50ML LL SCALE MARK (SYRINGE) IMPLANT
SYR BULB IRRIGATION 50ML (SYRINGE) ×3 IMPLANT
SYR CONTROL 10ML LL (SYRINGE) ×2 IMPLANT
TOWEL GREEN STERILE FF (TOWEL DISPOSABLE) ×6 IMPLANT
TUBE CONNECTING 20'X1/4 (TUBING) ×2
TUBE CONNECTING 20X1/4 (TUBING) ×3 IMPLANT
UNDERPAD 30X36 HEAVY ABSORB (UNDERPADS AND DIAPERS) ×6 IMPLANT
YANKAUER SUCT BULB TIP NO VENT (SUCTIONS) ×3 IMPLANT

## 2019-04-12 NOTE — Discharge Instructions (Signed)
Post Anesthesia Home Care Instructions  Activity: Get plenty of rest for the remainder of the day. A responsible individual must stay with you for 24 hours following the procedure.  For the next 24 hours, DO NOT: -Drive a car -Paediatric nurse -Drink alcoholic beverages -Take any medication unless instructed by your physician -Make any legal decisions or sign important papers.  Meals: Start with liquid foods such as gelatin or soup. Progress to regular foods as tolerated. Avoid greasy, spicy, heavy foods. If nausea and/or vomiting occur, drink only clear liquids until the nausea and/or vomiting subsides. Call your physician if vomiting continues.  Special Instructions/Symptoms: Your throat may feel dry or sore from the anesthesia or the breathing tube placed in your throat during surgery. If this causes discomfort, gargle with warm salt water. The discomfort should disappear within 24 hours.  If you had a scopolamine patch placed behind your ear for the management of post- operative nausea and/or vomiting:  1. The medication in the patch is effective for 72 hours, after which it should be removed.  Wrap patch in a tissue and discard in the trash. Wash hands thoroughly with soap and water. 2. You may remove the patch earlier than 72 hours if you experience unpleasant side effects which may include dry mouth, dizziness or visual disturbances. 3. Avoid touching the patch. Wash your hands with soap and water after contact with the patch.   No tylenol until after 4pm today.  No ibuprofen until after 6pm today.      About my Jackson-Pratt Bulb Drain  What is a Jackson-Pratt bulb? A Jackson-Pratt is a soft, round device used to collect drainage. It is connected to a long, thin drainage catheter, which is held in place by one or two small stiches near your surgical incision site. When the bulb is squeezed, it forms a vacuum, forcing the drainage to empty into the bulb.  Emptying the  Jackson-Pratt bulb- To empty the bulb: 1. Release the plug on the top of the bulb. 2. Pour the bulb's contents into a measuring container which your nurse will provide. 3. Record the time emptied and amount of drainage. Empty the drain(s) as often as your     doctor or nurse recommends.  Date                  Time                    Amount (Drain 1)                 Amount (Drain 2)  _____________________________________________________________________  _____________________________________________________________________  _____________________________________________________________________  _____________________________________________________________________  _____________________________________________________________________  _____________________________________________________________________  _____________________________________________________________________  _____________________________________________________________________  Squeezing the Jackson-Pratt Bulb- To squeeze the bulb: 1. Make sure the plug at the top of the bulb is open. 2. Squeeze the bulb tightly in your fist. You will hear air squeezing from the bulb. 3. Replace the plug while the bulb is squeezed. 4. Use a safety pin to attach the bulb to your clothing. This will keep the catheter from     pulling at the bulb insertion site.  When to call your doctor- Call your doctor if:  Drain site becomes red, swollen or hot.  You have a fever greater than 101 degrees F.  There is oozing at the drain site.  Drain falls out (apply a guaze bandage over the drain hole and secure it with tape).  Drainage increases daily not related to activity patterns. (You will  usually have more drainage when you are active than when you are resting.)  Drainage has a bad odor.

## 2019-04-12 NOTE — Anesthesia Postprocedure Evaluation (Signed)
Anesthesia Post Note  Patient: Marissa Brooks  Procedure(s) Performed: REMOVAL LEFT BREAST IMPLANT (Left Breast) LEFT CAPSULECTOMY (Left Breast)     Patient location during evaluation: PACU Anesthesia Type: General Level of consciousness: awake and alert Pain management: pain level controlled Vital Signs Assessment: post-procedure vital signs reviewed and stable Respiratory status: spontaneous breathing, nonlabored ventilation, respiratory function stable and patient connected to nasal cannula oxygen Cardiovascular status: blood pressure returned to baseline and stable Postop Assessment: no apparent nausea or vomiting Anesthetic complications: no    Last Vitals:  Vitals:   04/12/19 1316 04/12/19 1353  BP: 132/63 (!) 116/53  Pulse: 64 (!) 51  Resp: 18 18  Temp: 36.9 C   SpO2: 100% 100%    Last Pain:  Vitals:   04/12/19 1316  TempSrc: Oral  PainSc: 6                  Paije Goodhart L Lashai Grosch

## 2019-04-12 NOTE — Anesthesia Preprocedure Evaluation (Addendum)
Anesthesia Evaluation  Patient identified by MRN, date of birth, ID band Patient awake    Reviewed: Allergy & Precautions, NPO status , Patient's Chart, lab work & pertinent test results  Airway Mallampati: I  TM Distance: >3 FB Neck ROM: Full    Dental no notable dental hx. (+) Teeth Intact, Dental Advisory Given   Pulmonary COPD, Current Smoker and Patient abstained from smoking.,    Pulmonary exam normal breath sounds clear to auscultation       Cardiovascular hypertension, Normal cardiovascular exam Rhythm:Regular Rate:Normal     Neuro/Psych PSYCHIATRIC DISORDERS Anxiety Depression Bipolar Disorder negative neurological ROS     GI/Hepatic GERD  ,(+)     substance abuse (last use 2016)  alcohol use and cocaine use, Hepatitis -, C  Endo/Other  negative endocrine ROS  Renal/GU negative Renal ROS  negative genitourinary   Musculoskeletal  (+) Arthritis ,   Abdominal   Peds  Hematology negative hematology ROS (+)   Anesthesia Other Findings H/o b/l breast cancer  Reproductive/Obstetrics                            Anesthesia Physical Anesthesia Plan  ASA: III  Anesthesia Plan: General   Post-op Pain Management:    Induction: Intravenous  PONV Risk Score and Plan: Ondansetron, Dexamethasone and Midazolam  Airway Management Planned: LMA  Additional Equipment:   Intra-op Plan:   Post-operative Plan: Extubation in OR  Informed Consent: I have reviewed the patients History and Physical, chart, labs and discussed the procedure including the risks, benefits and alternatives for the proposed anesthesia with the patient or authorized representative who has indicated his/her understanding and acceptance.     Dental advisory given  Plan Discussed with: CRNA  Anesthesia Plan Comments:         Anesthesia Quick Evaluation

## 2019-04-12 NOTE — Op Note (Signed)
Operative Note   DATE OF OPERATION: 11.20.20  LOCATION: [facility]-outpatient  SURGICAL DIVISION: Plastic Surgery  PREOPERATIVE DIAGNOSES:  1. History bilateral breast cancer 2. Acquired absence breast  POSTOPERATIVE DIAGNOSES:  same  PROCEDURE:  Removal left breast implant with partial capsulectomy  SURGEON: Irene Limbo MD MBA  ASSISTANT: none  ANESTHESIA:  General.   EBL: 15 ml  COMPLICATIONS: None immediate.   INDICATIONS FOR PROCEDURE:  The patient, Marissa Brooks, is a 63 y.o. female born on November 19, 1955, is here for removal left breast implant. Patient underwent left mastectomy with implant based reconstruction in 2006. She underwent right mastectomy without reconstruction 2016. She presents for removal left breast implant.   FINDINGS: Removed intact textured round silicone implant labeled McGhan Style 115 [id number] cc Lot [id number] from complete submuscular position. No seroma present. No double capsule present. Capsule noted to be very thin and capsulectomy limited to anterior surface.   DESCRIPTION OF PROCEDURE:  The patient's operative site was marked with the patient in the preoperative area. The patient was taken to the operating room. SCDs were placed and IV antibiotics were given. The patient's operative site was prepped and draped in a sterile fashion. A time out was performed and all information was confirmed to be correct. Incision made in left mastectomy scar and carried through pectoralis muscle. Intact implant removed. Anterior capsulectomy performed. Local anesthetic infiltrated. 55 Fr JP placed in cavity and secured with 2-0 nylon. Closure completed with 3-0 vicryl for approximation muscle, 4-0 vicryl in dermis, 4-0 monocryl subcuticular skin closure. Dermabond applied followed by dry dressing and breast binder.  The patient was allowed to wake from anesthesia, extubated and taken to the recovery room in satisfactory condition.   SPECIMENS: left breast  capsule  DRAINS: 27 Fr JP in left submuscular chest  Irene Limbo, MD [facility]

## 2019-04-12 NOTE — Interval H&P Note (Signed)
History and Physical Interval Note:  04/12/2019 10:40 AM  Marissa Brooks  has presented today for surgery, with the diagnosis of history bilateral breast cancer, acquired absence breasts.  The various methods of treatment have been discussed with the patient and family. After consideration of risks, benefits and other options for treatment, the patient has consented to  Procedure(s): REMOVAL LEFT BREAST IMPLANT (Left) POSSIBLE LEFT CAPSULECTOMY (Left) as a surgical intervention.  The patient's history has been reviewed, patient examined, no change in status, stable for surgery.  I have reviewed the patient's chart and labs.  Questions were answered to the patient's satisfaction.     Arnoldo Hooker Areen Trautner

## 2019-04-12 NOTE — Anesthesia Procedure Notes (Addendum)
Procedure Name: Intubation Date/Time: 04/12/2019 11:03 AM Performed by: Lavonia Dana, CRNA Pre-anesthesia Checklist: Patient identified, Emergency Drugs available, Suction available and Patient being monitored Patient Re-evaluated:Patient Re-evaluated prior to induction Oxygen Delivery Method: Circle system utilized Preoxygenation: Pre-oxygenation with 100% oxygen Induction Type: IV induction Ventilation: Mask ventilation without difficulty Laryngoscope Size: Mac and 3 Grade View: Grade II Tube type: Oral Tube size: 7.0 mm Number of attempts: 1 Airway Equipment and Method: Stylet,  Oral airway and Bite block Placement Confirmation: ETT inserted through vocal cords under direct vision,  positive ETCO2 and breath sounds checked- equal and bilateral Secured at: 22 cm Tube secured with: Tape Dental Injury: Teeth and Oropharynx as per pre-operative assessment  Comments: Grade IIa view

## 2019-04-12 NOTE — Transfer of Care (Signed)
Immediate Anesthesia Transfer of Care Note  Patient: Marissa Brooks  Procedure(s) Performed: REMOVAL LEFT BREAST IMPLANT (Left Breast) LEFT CAPSULECTOMY (Left Breast)  Patient Location: PACU  Anesthesia Type:General  Level of Consciousness: drowsy  Airway & Oxygen Therapy: Patient Spontanous Breathing and Patient connected to face mask oxygen  Post-op Assessment: Report given to RN and Post -op Vital signs reviewed and stable  Post vital signs: Reviewed and stable  Last Vitals:  Vitals Value Taken Time  BP 145/64 04/12/19 1215  Temp    Pulse 54 04/12/19 1217  Resp 15 04/12/19 1217  SpO2 100 % 04/12/19 1217  Vitals shown include unvalidated device data.  Last Pain:  Vitals:   04/12/19 0931  TempSrc: Oral  PainSc: 0-No pain         Complications: No apparent anesthesia complications

## 2019-04-13 LAB — SURGICAL PATHOLOGY

## 2019-04-15 ENCOUNTER — Encounter (HOSPITAL_BASED_OUTPATIENT_CLINIC_OR_DEPARTMENT_OTHER): Payer: Self-pay | Admitting: Plastic Surgery

## 2019-07-26 ENCOUNTER — Telehealth: Payer: Self-pay

## 2019-07-26 NOTE — Telephone Encounter (Signed)
Patient called office today to inform office she has moved to [place], [place]. Will contact [facility] to see if she can establish care.  [place]

## 2019-07-29 NOTE — Telephone Encounter (Signed)
Thanks.  You can cancel the appt and let Juliann Pulse know.

## 2019-07-30 ENCOUNTER — Telehealth: Payer: Self-pay | Admitting: *Deleted

## 2019-07-30 NOTE — Telephone Encounter (Signed)
She just needs an ultrasound twice a year to screen for [facility] due to her cirrhosis.  This can be done by a PCP or any of the options.  Probably easiest to find GI if not done by a PCP.

## 2019-07-30 NOTE — Telephone Encounter (Signed)
Patient calling again for assistance in transferring her care. She was successfully treated for hepatitis C, now should have [facility] screenings twice a year per Dr Linus Salmons.  She has moved to [place], [place], and cannot come up to [place] for this any longer. Please advise if she needs to continue with infectious disease, GI, or hepatology for routine screening.  RN will help connect her to care. Landis Gandy, RN

## 2019-08-07 NOTE — Telephone Encounter (Signed)
Attempted to call patient to find out who her new PCP is, or to help connect her to GI.  Call not answered, voicemail not yet set up.  Will follow.

## 2019-08-28 ENCOUNTER — Other Ambulatory Visit: Payer: Self-pay

## 2019-08-29 ENCOUNTER — Ambulatory Visit: Payer: Medicare HMO | Admitting: Hematology

## 2019-08-29 ENCOUNTER — Other Ambulatory Visit: Payer: Medicare HMO

## 2019-09-12 ENCOUNTER — Other Ambulatory Visit: Payer: Self-pay | Admitting: Hematology

## 2019-09-12 DIAGNOSIS — C50411 Malignant neoplasm of upper-outer quadrant of right female breast: Secondary | ICD-10-CM

## 2019-09-12 DIAGNOSIS — Z170 Estrogen receptor positive status [ER+]: Secondary | ICD-10-CM

## 2019-10-23 NOTE — Telephone Encounter (Signed)
Still no answer or voicemail. Will try again.

## 2019-10-29 ENCOUNTER — Telehealth: Payer: Self-pay | Admitting: Hematology

## 2019-10-29 NOTE — Telephone Encounter (Signed)
Called pt per 6/8 sch message - no answer and no vmail set up

## 2020-01-08 ENCOUNTER — Ambulatory Visit: Payer: Medicare HMO | Admitting: Internal Medicine

## 2020-01-15 NOTE — Telephone Encounter (Signed)
Attempted to follow up. No answer at the listed phone number, no voicemail set up.  Will close.

## 2022-09-12 ENCOUNTER — Encounter (HOSPITAL_COMMUNITY): Payer: Self-pay
